# Patient Record
Sex: Male | Born: 1960 | Race: White | Hispanic: No | Smoking: Current every day smoker
Health system: Southern US, Community
[De-identification: ages and names within clinical notes are randomized; demographics above are authoritative.]

## PROBLEM LIST (undated history)

## (undated) DIAGNOSIS — J449 Chronic obstructive pulmonary disease, unspecified: Secondary | ICD-10-CM

## (undated) DIAGNOSIS — I82402 Acute embolism and thrombosis of unspecified deep veins of left lower extremity: Secondary | ICD-10-CM

## (undated) DIAGNOSIS — K5790 Diverticulosis of intestine, part unspecified, without perforation or abscess without bleeding: Secondary | ICD-10-CM

## (undated) DIAGNOSIS — G5600 Carpal tunnel syndrome, unspecified upper limb: Secondary | ICD-10-CM

## (undated) DIAGNOSIS — Z95828 Presence of other vascular implants and grafts: Secondary | ICD-10-CM

## (undated) DIAGNOSIS — Z9889 Other specified postprocedural states: Secondary | ICD-10-CM

## (undated) DIAGNOSIS — G9389 Other specified disorders of brain: Secondary | ICD-10-CM

## (undated) DIAGNOSIS — I10 Essential (primary) hypertension: Secondary | ICD-10-CM

## (undated) DIAGNOSIS — I471 Supraventricular tachycardia, unspecified: Secondary | ICD-10-CM

## (undated) DIAGNOSIS — I209 Angina pectoris, unspecified: Secondary | ICD-10-CM

## (undated) DIAGNOSIS — F1020 Alcohol dependence, uncomplicated: Secondary | ICD-10-CM

## (undated) DIAGNOSIS — I447 Left bundle-branch block, unspecified: Secondary | ICD-10-CM

## (undated) DIAGNOSIS — F32A Depression, unspecified: Secondary | ICD-10-CM

## (undated) DIAGNOSIS — Z7901 Long term (current) use of anticoagulants: Secondary | ICD-10-CM

## (undated) DIAGNOSIS — R7303 Prediabetes: Secondary | ICD-10-CM

## (undated) DIAGNOSIS — K219 Gastro-esophageal reflux disease without esophagitis: Secondary | ICD-10-CM

## (undated) DIAGNOSIS — R0600 Dyspnea, unspecified: Secondary | ICD-10-CM

## (undated) DIAGNOSIS — M1990 Unspecified osteoarthritis, unspecified site: Secondary | ICD-10-CM

## (undated) DIAGNOSIS — F17210 Nicotine dependence, cigarettes, uncomplicated: Secondary | ICD-10-CM

## (undated) DIAGNOSIS — G992 Myelopathy in diseases classified elsewhere: Secondary | ICD-10-CM

## (undated) DIAGNOSIS — K760 Fatty (change of) liver, not elsewhere classified: Secondary | ICD-10-CM

## (undated) DIAGNOSIS — I5189 Other ill-defined heart diseases: Secondary | ICD-10-CM

## (undated) DIAGNOSIS — I499 Cardiac arrhythmia, unspecified: Secondary | ICD-10-CM

## (undated) DIAGNOSIS — I6529 Occlusion and stenosis of unspecified carotid artery: Secondary | ICD-10-CM

## (undated) DIAGNOSIS — I77810 Thoracic aortic ectasia: Secondary | ICD-10-CM

## (undated) DIAGNOSIS — M720 Palmar fascial fibromatosis [Dupuytren]: Secondary | ICD-10-CM

## (undated) DIAGNOSIS — E785 Hyperlipidemia, unspecified: Secondary | ICD-10-CM

## (undated) DIAGNOSIS — E559 Vitamin D deficiency, unspecified: Secondary | ICD-10-CM

## (undated) DIAGNOSIS — K4020 Bilateral inguinal hernia, without obstruction or gangrene, not specified as recurrent: Secondary | ICD-10-CM

## (undated) DIAGNOSIS — I714 Abdominal aortic aneurysm, without rupture, unspecified: Secondary | ICD-10-CM

## (undated) DIAGNOSIS — I2510 Atherosclerotic heart disease of native coronary artery without angina pectoris: Secondary | ICD-10-CM

## (undated) DIAGNOSIS — E538 Deficiency of other specified B group vitamins: Secondary | ICD-10-CM

## (undated) DIAGNOSIS — R918 Other nonspecific abnormal finding of lung field: Secondary | ICD-10-CM

## (undated) DIAGNOSIS — I82409 Acute embolism and thrombosis of unspecified deep veins of unspecified lower extremity: Secondary | ICD-10-CM

## (undated) DIAGNOSIS — F419 Anxiety disorder, unspecified: Secondary | ICD-10-CM

## (undated) DIAGNOSIS — I700 Atherosclerosis of aorta: Secondary | ICD-10-CM

## (undated) DIAGNOSIS — I779 Disorder of arteries and arterioles, unspecified: Secondary | ICD-10-CM

## (undated) HISTORY — PX: CHOLECYSTECTOMY: SHX55

## (undated) HISTORY — PX: ANKLE SURGERY: SHX546

## (undated) HISTORY — PX: COLONOSCOPY: SHX174

## (undated) HISTORY — PX: SVT ABLATION: EP1225

## (undated) HISTORY — PX: AORTO-FEMORAL BYPASS GRAFT: SHX885

## (undated) HISTORY — PX: TONSILLECTOMY: SUR1361

---

## 1998-04-22 HISTORY — PX: CHOLECYSTECTOMY: SHX55

## 2009-05-26 DIAGNOSIS — F1010 Alcohol abuse, uncomplicated: Secondary | ICD-10-CM | POA: Insufficient documentation

## 2009-05-26 DIAGNOSIS — F17200 Nicotine dependence, unspecified, uncomplicated: Secondary | ICD-10-CM | POA: Insufficient documentation

## 2011-10-22 ENCOUNTER — Ambulatory Visit: Payer: Self-pay | Admitting: Family Medicine

## 2013-12-03 DIAGNOSIS — J449 Chronic obstructive pulmonary disease, unspecified: Secondary | ICD-10-CM | POA: Insufficient documentation

## 2013-12-03 DIAGNOSIS — I10 Essential (primary) hypertension: Secondary | ICD-10-CM | POA: Insufficient documentation

## 2013-12-03 DIAGNOSIS — F419 Anxiety disorder, unspecified: Secondary | ICD-10-CM | POA: Insufficient documentation

## 2013-12-03 DIAGNOSIS — I2510 Atherosclerotic heart disease of native coronary artery without angina pectoris: Secondary | ICD-10-CM | POA: Insufficient documentation

## 2013-12-03 DIAGNOSIS — J309 Allergic rhinitis, unspecified: Secondary | ICD-10-CM | POA: Insufficient documentation

## 2014-12-07 ENCOUNTER — Ambulatory Visit: Admission: EM | Admit: 2014-12-07 | Discharge: 2014-12-07 | Discharge status: Absent without leave | Payer: Self-pay

## 2014-12-07 ENCOUNTER — Ambulatory Visit
Admission: EM | Admit: 2014-12-07 | Discharge: 2014-12-07 | Disposition: A | Discharge status: Home / Self Care | Payer: 59

## 2014-12-07 DIAGNOSIS — R519 Headache, unspecified: Secondary | ICD-10-CM

## 2014-12-07 DIAGNOSIS — R51 Headache: Secondary | ICD-10-CM

## 2014-12-07 DIAGNOSIS — I10 Essential (primary) hypertension: Secondary | ICD-10-CM | POA: Diagnosis not present

## 2014-12-07 HISTORY — DX: Essential (primary) hypertension: I10

## 2014-12-07 NOTE — ED Provider Notes (Signed)
Patient presents today with symptoms of right temporal headache, right eye pain, mild dizziness, headache that seems to be getting worse since yesterday. Patient has a history of high blood pressure and does take blood pressure medication daily. He also does take baby aspirin daily. He has not seen a physician in over a year and a half. He does drink alcohol 3-4 beers a night and also does smoke cigarettes. Denies any chest pain, shortness of breath, abdominal pain, nausea, vomiting, fever, vision loss, slurred speech, weakness of extremities.  ROS: Negative except mentioned above. Vitals as per Epic.  GENERAL: NAD HEENT: no significant tenderness of the right temporal area to palpation, no pharyngeal erythema, no exudate, no erythema of TMs, PERRL, EOMI, unable to appreciate a foreign body or lesion on the eyelids RESP: CTA B CARD: RRR NEURO: CN II-XII grossly intact   A/P: Elevated Blood Pressure, Headache, and R Eye Symptoms- discussed with patient that I would recommend an EKG, labs, blood pressure lowering medication, and possibly transfer to the ER for further evaluation and treatment. Patient was not interested in this and insisted that he just wanted 1 clonidine to decrease his blood pressure. Patient states that he will follow-up with his doctor tomorrow if possible. Patient was informed of the risks with untreated elevated blood pressure including stroke, death. Patient signed AMA since he did not want to follow my recommendations above.    Jolene Provost, MD 12/07/14 1414

## 2014-12-07 NOTE — ED Notes (Addendum)
States yesterday at work and suddenly became dizzy and right eye pain. Felt pain and headache since and "getting worse". Took B/P this morning and was 183/111. Just prior to leaving work was 155/101

## 2014-12-07 NOTE — ED Notes (Signed)
Examined by Dr. Concha Se and recommended patient go to ER now. Patient refused to go and AMA form signed by patient in his assigned room. Admits to drinking 3-4 beers per day, breath smells of alcohol on assessment. Advised of potential problems with lack of diagnosis and/or treatment.

## 2014-12-08 ENCOUNTER — Emergency Department: Payer: 59

## 2014-12-08 ENCOUNTER — Emergency Department
Admission: EM | Admit: 2014-12-08 | Discharge: 2014-12-08 | Disposition: A | Discharge status: Home / Self Care | Payer: 59 | Attending: Student | Admitting: Student

## 2014-12-08 ENCOUNTER — Other Ambulatory Visit: Payer: Self-pay

## 2014-12-08 ENCOUNTER — Encounter: Payer: Self-pay | Admitting: Emergency Medicine

## 2014-12-08 DIAGNOSIS — Z79899 Other long term (current) drug therapy: Secondary | ICD-10-CM | POA: Diagnosis not present

## 2014-12-08 DIAGNOSIS — R51 Headache: Secondary | ICD-10-CM | POA: Insufficient documentation

## 2014-12-08 DIAGNOSIS — I10 Essential (primary) hypertension: Secondary | ICD-10-CM | POA: Insufficient documentation

## 2014-12-08 DIAGNOSIS — J0130 Acute sphenoidal sinusitis, unspecified: Secondary | ICD-10-CM

## 2014-12-08 DIAGNOSIS — H5711 Ocular pain, right eye: Secondary | ICD-10-CM | POA: Diagnosis not present

## 2014-12-08 DIAGNOSIS — Z720 Tobacco use: Secondary | ICD-10-CM | POA: Diagnosis not present

## 2014-12-08 DIAGNOSIS — Z7982 Long term (current) use of aspirin: Secondary | ICD-10-CM | POA: Diagnosis not present

## 2014-12-08 LAB — BASIC METABOLIC PANEL
ANION GAP: 8 (ref 5–15)
BUN: 13 mg/dL (ref 6–20)
CHLORIDE: 99 mmol/L — AB (ref 101–111)
CO2: 31 mmol/L (ref 22–32)
Calcium: 9.4 mg/dL (ref 8.9–10.3)
Creatinine, Ser: 0.9 mg/dL (ref 0.61–1.24)
GFR calc Af Amer: >60 mL/min (ref >60)
GLUCOSE: 122 mg/dL — AB (ref 65–99)
POTASSIUM: 3.8 mmol/L (ref 3.5–5.1)
Sodium: 138 mmol/L (ref 135–145)

## 2014-12-08 LAB — CBC
HEMATOCRIT: 48.7 % (ref 40.0–52.0)
HEMOGLOBIN: 16.3 g/dL (ref 13.0–18.0)
MCH: 31.9 pg (ref 26.0–34.0)
MCHC: 33.5 g/dL (ref 32.0–36.0)
MCV: 95.1 fL (ref 80.0–100.0)
Platelets: 198 10*3/uL (ref 150–440)
RBC: 5.13 MIL/uL (ref 4.40–5.90)
RDW: 13.3 % (ref 11.5–14.5)
WBC: 6.8 10*3/uL (ref 3.8–10.6)

## 2014-12-08 MED ORDER — SODIUM CHLORIDE 0.9 % IV BOLUS (SEPSIS)
500.0000 mL | Freq: Once | INTRAVENOUS | Status: AC
  Administered 2014-12-08: 500 mL via INTRAVENOUS

## 2014-12-08 MED ORDER — IOHEXOL 300 MG/ML  SOLN
75.0000 mL | Freq: Once | INTRAMUSCULAR | Status: AC | PRN
  Administered 2014-12-08: 75 mL via INTRAVENOUS

## 2014-12-08 MED ORDER — CLONIDINE HCL 0.1 MG PO TABS
0.1000 mg | ORAL_TABLET | Freq: Once | ORAL | Status: AC
  Administered 2014-12-08: 0.1 mg via ORAL
  Filled 2014-12-08: qty 1

## 2014-12-08 MED ORDER — AMOXICILLIN-POT CLAVULANATE 875-125 MG PO TABS: 1.0000 | ORAL_TABLET | Freq: Two times a day (BID) | ORAL | Status: DC

## 2014-12-08 NOTE — ED Provider Notes (Signed)
[facility] Emergency Department Provider Note  ____________________________________________  Time seen: Approximately 9:46 AM  I have reviewed the triage vital signs and the nursing notes.   HISTORY  Chief Complaint Hypertension and Eye Pain    HPI Scott [name] is a 54 y.o. male with history of hypertension, anxiety who presents for evaluation of 2 days constant right-sided headache, right eye pain. Patient reports that 2 days ago he felt like something "burst in my eye" suddenly. He has had no change in his vision. He has had pain associated with the right eye, periorbital region. He has had some tearing from the eye but no drainage.  He has chronic runny nose. He was seen by his doctor yesterday and his blood pressure was elevated they referred him to the emergency department however he was not able to come. He denies any trauma to the eye. Currently his symptoms are moderate in severity. Pain in the eyes worsen with movement of the eye. No chest pain or difficulty breathing, no nausea, vomiting, diarrhea, fevers or chills. He does take  Lisinopril for  Blood pressure. Headache is gradual onset, has been intermittent. Current severity of pain is 5 out of 10.   Past Medical History  Diagnosis Date  . Hypertension     There are no active problems to display for this patient.   Past Surgical History  Procedure Laterality Date  . Cholecystectomy      Current Outpatient Rx  Name  Route  Sig  Dispense  Refill  . aspirin 81 MG tablet   Oral   Take 81 mg by mouth daily.         Marland Kitchen lisinopril-hydrochlorothiazide (PRINZIDE,ZESTORETIC) 20-25 MG per tablet   Oral   Take 1 tablet by mouth daily.         Marland Kitchen amoxicillin-clavulanate (AUGMENTIN) 875-125 MG per tablet   Oral   Take 1 tablet by mouth 2 (two) times daily.   20 tablet   0     Allergies Codeine  Family History  Problem Relation Age of Onset  . Diabetes Mother   . Hypertension Mother    . Lupus Mother   . Heart attack Father   . Cancer Brother   . Cancer Brother     Social History Social History  Substance Use Topics  . Smoking status: Current Every Day Smoker -- 1.00 packs/day    Types: Cigarettes  . Smokeless tobacco: None  . Alcohol Use: Yes     Comment: 3-4 beers per day    Review of Systems Constitutional: No fever/chills Eyes: No visual changes. ENT: No sore throat. Cardiovascular: Denies chest pain. Respiratory: Denies shortness of breath. Gastrointestinal: No abdominal pain.  No nausea, no vomiting.  No diarrhea.  No constipation. Genitourinary: Negative for dysuria. Musculoskeletal: Negative for back pain. Skin: Negative for rash. Neurological: Positive for headache, negative for focal weakness or numbness.  10-point ROS otherwise negative.  ____________________________________________   PHYSICAL EXAM:  VITAL SIGNS: ED Triage Vitals  Enc Vitals Group     BP 12/08/14 0907 182/112 mmHg     Pulse Rate 12/08/14 0907 85     Resp 12/08/14 0907 18     Temp 12/08/14 0907 98.2 F (36.8 C)     Temp Source 12/08/14 0907 Oral     SpO2 12/08/14 0907 97 %     Weight 12/08/14 0907 169 lb (76.658 kg)     Height 12/08/14 0907 5\' 11"  (1.803 m)  Head Cir --      Peak Flow --      Pain Score 12/08/14 0911 6     Pain Loc --      Pain Edu? --      Excl. in GC? --     Constitutional: Alert and oriented. Well appearing and in no acute distress. Eyes: Conjunctivae are normal. PERRL. EOMI. Head: Atraumatic. Tenderness to palpation in the periorbital region on the right. Nose: No congestion/rhinnorhea. Mouth/Throat: Mucous membranes are moist.  Oropharynx non-erythematous. Neck: No stridor.  Cardiovascular: Normal rate, regular rhythm. Grossly normal heart sounds.  Good peripheral circulation. Respiratory: Normal respiratory effort.  No retractions. Lungs CTAB. Gastrointestinal: Soft and nontender. No distention. No abdominal bruits. No CVA  tenderness. Genitourinary: deferred Musculoskeletal: No lower extremity tenderness nor edema.  No joint effusions. Neurologic:  Normal speech and language. No gross focal neurologic deficits are appreciated. No gait instability. Skin:  Skin is warm, dry and intact. No rash noted. Psychiatric: Mood and affect are normal. Speech and behavior are normal.  ____________________________________________   LABS (all labs ordered are listed, but only abnormal results are displayed)  Labs Reviewed  BASIC METABOLIC PANEL - Abnormal; Notable for the following:    Chloride 99 (*)    Glucose, Bld 122 (*)    All other components within normal limits  CBC   ____________________________________________  EKG  ED ECG REPORT I, Gayla Doss, the attending physician, personally viewed and interpreted this ECG.   Date: 12/08/2014  EKG Time: 09:14  Rate: 71  Rhythm: normal EKG, normal sinus rhythm  Axis: normal  Intervals:none  ST&T Change: No acute ST segment elevation  ____________________________________________  RADIOLOGY  CT head IMPRESSION: 1. Negative noncontrast CT appearance of the brain. 2. Chronic right sphenoid sinusitis.   CT head with IV contrast IMPRESSION: Negative CT venogram. No cavernous sinus or other venous sinus thrombosis.  Advanced bilateral ICA siphon calcified atherosclerosis. ____________________________________________   PROCEDURES  Procedure(s) performed: None  Critical Care performed: No  ____________________________________________   INITIAL IMPRESSION / ASSESSMENT AND PLAN / ED COURSE  Pertinent labs & imaging results that were available during my care of the patient were reviewed by me and considered in my medical decision making (see chart for details).  [name] is a 54 y.o. male with history of hypertension, anxiety who presents for evaluation of 2 days constant right-sided headache, right eye pain. On exam, he is very  well-appearing in no acute distress. He is afebrile. He has pain with movement of the right eye but extraocular movements are intact. Pupils equally reactive to light. He has tenderness in the infraorbital region, supraorbital region, lateral to the right eye. There is no proptosis. He has no fever, and neck is supple without meningismus. CT head is concerning for right sphenoid sinusitis which may be the cause of his symptoms however given his headache and pain, discussed with radiologist, Dr. Margo Aye, who recommends CT venogram for rule out cavernous venous sinus thrombosis. His blood pressure has improved without any intervention. I do not think that his headache is secondary to his hypertension. He has no chest pain, no difficulty breathing, no evidence of end organ damage.    ----------------------------------------- 3:27 PM on 12/08/2014 ----------------------------------------- CT venogram negative for any acute Sinus thrombosis. Normal visual acuity. Blood pressure improved with clonidine. Suspect sinusitis as the cause of his right-sided facial/eye pain. Discussed return precautions, need for close PCP follow-up and recheck of blood pressure. DC with Augmentin. He  is comfortable with discharge plan.  ____________________________________________   FINAL CLINICAL IMPRESSION(S) / ED DIAGNOSES  Final diagnoses:  Acute pain in right eye  Acute sphenoidal sinusitis, recurrence not specified      Gayla Doss, MD 12/08/14 1609

## 2014-12-08 NOTE — ED Notes (Signed)
Patient to ED after being seen at [facility] yesterday, reports he had something "pop" behind his right eye a few days ago and then was seen yesterday where they found his BP was elevated. They wanted him to come at that time but he was unable to. Reports compliance with his BP meds.

## 2014-12-08 NOTE — ED Notes (Signed)
Increase stress at work , increased HTN , x2 days felt something like a burst occurred in the right eye, with irritation. No visual changes,

## 2014-12-08 NOTE — ED Notes (Signed)
MD at bedside., Dr.Gayle 

## 2015-06-19 DIAGNOSIS — E7800 Pure hypercholesterolemia, unspecified: Secondary | ICD-10-CM | POA: Insufficient documentation

## 2015-06-19 DIAGNOSIS — F17210 Nicotine dependence, cigarettes, uncomplicated: Secondary | ICD-10-CM | POA: Insufficient documentation

## 2015-12-20 DIAGNOSIS — I471 Supraventricular tachycardia, unspecified: Secondary | ICD-10-CM | POA: Insufficient documentation

## 2016-02-28 HISTORY — PX: SVT ABLATION: EP1225

## 2016-10-09 ENCOUNTER — Ambulatory Visit
Admission: EM | Admit: 2016-10-09 | Discharge: 2016-10-09 | Disposition: A | Discharge status: Home / Self Care | Payer: Worker's Compensation | Attending: Emergency Medicine | Admitting: Emergency Medicine

## 2016-10-09 ENCOUNTER — Encounter: Payer: Self-pay | Admitting: Gynecology

## 2016-10-09 ENCOUNTER — Ambulatory Visit (INDEPENDENT_AMBULATORY_CARE_PROVIDER_SITE_OTHER): Payer: Worker's Compensation

## 2016-10-09 DIAGNOSIS — M792 Neuralgia and neuritis, unspecified: Secondary | ICD-10-CM

## 2016-10-09 DIAGNOSIS — S5002XA Contusion of left elbow, initial encounter: Secondary | ICD-10-CM

## 2016-10-09 MED ORDER — PREDNISONE 10 MG (21) PO TBPK: ORAL_TABLET | ORAL | 0 refills | Status: DC

## 2016-10-09 MED ORDER — IBUPROFEN 600 MG PO TABS: 600.0000 mg | ORAL_TABLET | Freq: Four times a day (QID) | ORAL | 0 refills | Status: AC | PRN

## 2016-10-09 MED ORDER — TRAMADOL HCL 50 MG PO TABS: 50.0000 mg | ORAL_TABLET | Freq: Four times a day (QID) | ORAL | 0 refills | Status: DC | PRN

## 2016-10-09 NOTE — ED Provider Notes (Signed)
HPI  SUBJECTIVE:  Scott [name] is a right handed 56 y.o. male who presents with left elbow pain, numbness along the top of his hand, burning pain along his lateral left arm after bumping his lateral elbow against a trolley yesterday. He reports swelling at the lateral joint, constant stinging pain going up his lateral arm and extending down his forearm. Reports numbness along the top of the hand and in the fingers. No numbness along the palm of the hand.  He reports limitation of motion secondary to pain and purplish discoloration of his hand. States that's what prompted medical evaluation. He tried ice, Tylenol. Sx better with holding his elbow in a flexed position Symptoms worse with lifting, driving, gripping objects, pronation, supination, flexion and extension of his elbow. He states that he hit his left elbow on a ladder at work 6 days ago, but that there was no residual injury. No previous history of injury to elbow he is not on any antiplatelet or anticoagulant drugs. No history of diabetes, kidney disease. 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 PMD: Dr. Elmer Ramp. This is a worker's comp injury.  Past Medical History:  Diagnosis Date  . Hypertension     Past Surgical History:  Procedure Laterality Date  . CHOLECYSTECTOMY      Family History  Problem Relation Age of Onset  . Diabetes Mother   . Hypertension Mother   . Lupus Mother   . Heart attack Father   . Cancer Brother   . Cancer Brother     Social History  Substance Use Topics  . Smoking status: Current Every Day Smoker    Packs/day: 1.00    Types: Cigarettes  . Smokeless tobacco: Never Used  . Alcohol use Yes     Comment: 3-4 beers per day    No current facility-administered medications for this encounter.   Current Outpatient Prescriptions:  .  aspirin 81 MG tablet, Take 81 mg by mouth daily., Disp: , Rfl:  .   lisinopril-hydrochlorothiazide (PRINZIDE,ZESTORETIC) 20-25 MG per tablet, Take 1 tablet by mouth daily., Disp: , Rfl:  .  ibuprofen (ADVIL,MOTRIN) 600 MG tablet, Take 1 tablet (600 mg total) by mouth every 6 (six) hours as needed., Disp: 20 tablet, Rfl: 0 .  predniSONE (STERAPRED UNI-PAK 21 TAB) 10 MG (21) TBPK tablet, Dispense one 6 day pack. Take as directed with food., Disp: 21 tablet, Rfl: 0 .  traMADol (ULTRAM) 50 MG tablet, Take 1 tablet (50 mg total) by mouth every 6 (six) hours as needed., Disp: 20 tablet, Rfl: 0  Allergies  Allergen Reactions  . Codeine Nausea Only     ROS  As noted in HPI.   Physical Exam  BP (!) 145/96 (BP Location: Right Arm)   Pulse 72   Temp 98.6 F (37 C) (Oral)   Resp 16   Ht 5\' 11"  (1.803 m)   Wt 165 lb (74.8 kg)   SpO2 99%   BMI 23.01 kg/m   Constitutional: Well developed, well nourished, no acute distress Eyes:  EOMI, conjunctiva normal bilaterally HENT: Normocephalic, atraumatic,mucus membranes moist Respiratory: Normal inspiratory effort Cardiovascular: Normal rate GI: nondistended skin: No rash, skin intact Musculoskeletal: L Elbow ROM  Decreased, Unable to fully extend /pain with flex to 90 degrees,   Supracondylar region NT, Radial head tender, Olecrenon process NT, Medial epicondyle NT , Lateral epicondyle tender, left Shoulder NT, Wrist NT, Hand NT with  distal NVI CR<2 secs, radial pulse intact, Sensation LT and Motor intact distally in distribution of radial, median, and ulnar nerve function. Pain with supination, pronation, flexion and extension. And is pink and warm. Dependent rubor in both hands that resolves with hand elevation. No wrist or finger drop  Neurologic: Alert & oriented x 3, no focal neuro deficits Psychiatric: Speech and behavior appropriate   ED Course   Medications - No data to display  Orders Placed This Encounter  Procedures  . DG Elbow Complete Left    Standing Status:   Standing    Number of  Occurrences:   1    Order Specific Question:   Reason for Exam (SYMPTOM  OR DIAGNOSIS REQUIRED)    Answer:   lateral elbow injury r/o fx, effusion  . Apply other splint    Standing Status:   Standing    Number of Occurrences:   1    Order Specific Question:   Laterality    Answer:   Left    Order Specific Question:   Splint type    Answer:   posterior elbow splint with sling please    No results found for this or any previous visit (from the past 24 hour(s)). Dg Elbow Complete Left  Result Date: 10/09/2016 CLINICAL DATA:  Left elbow pain since 2 episodes of blunt trauma in the last week. EXAM: LEFT ELBOW - COMPLETE 3+ VIEW COMPARISON:  None FINDINGS: There is calcification at the medial and lateral epicondyles consistent with chronic medial and lateral epicondylitis. There is no fracture or dislocation. No joint effusion. No significant soft tissue swelling. IMPRESSION: Chronic degenerative changes of the medial and lateral epicondyles of the distal humerus. No acute abnormality. Electronically Signed   By: Francene BoyersJames  Maxwell M.D.   On: 10/09/2016 15:29    ED Clinical Impression  Contusion of left elbow, initial encounter  Nerve pain   ED Assessment/Plan  Reviewed imaging independently. Calcifications at the medial and lateral epicondyles consistent with chronic medial and lateral epicondylitis. No fracture-dislocation, effusion, soft tissue swelling. See radiology report for full details.  Presentation most consistent with elbow injury with  radial nerve contusion. There does not appear to be any acute arterial or venous occlusion.doubt compartment syndrome. Suspect that he has chronic peripheral vascular disease as he states he has it in his lower extremities and he is a smoker.he'll need to follow-up with his PMD regarding this.  Plan to put in a posterior elbow splint as he states that symptoms are better with holding his arm flexed, we'll also place in sling. Home with tramadol,  prednisone taper for 6 days, ibuprofen 600 mg 1 g of Tylenol follow-up with Tommie Maximiano CossAnne Moore in 2 days for reevaluation and possible referral to orthopedics.  Discussed imaging, MDM, plan and followup with patient. Discussed sn/sx that should prompt return to the ED. Patient agrees with plan.   Meds ordered this encounter  Medications  . predniSONE (STERAPRED UNI-PAK 21 TAB) 10 MG (21) TBPK tablet    Sig: Dispense one 6 day pack. Take as directed with food.    Dispense:  21 tablet    Refill:  0  . ibuprofen (ADVIL,MOTRIN) 600 MG tablet    Sig: Take 1 tablet (600 mg total) by mouth every 6 (six) hours as needed.    Dispense:  20 tablet    Refill:  0  . traMADol (ULTRAM) 50 MG tablet    Sig: Take 1 tablet (50 mg total) by mouth every  6 (six) hours as needed.    Dispense:  20 tablet    Refill:  0    *This clinic note was created using Scientist, clinical (histocompatibility and immunogenetics). Therefore, there may be occasional mistakes despite careful proofreading.  ?   Domenick Gong, MD 10/09/16 1622

## 2016-10-09 NOTE — Discharge Instructions (Signed)
600 mg of ibuprofen with 1 g and Tylenol 3-4 times a day as needed for pain. This is a very effective combination for pain. Initial prednisone. Tramadol for severe pain only. Wear the splint and the sling as needed for comfort. Follow up with Scott [name] within 2 days.

## 2016-10-09 NOTE — ED Triage Notes (Signed)
Patient c/o injury to left elbow x yesterday at work. Per patient one of the associate at work cart hit his left elbow. Per patient had previous injury to left elbow x 1 week ago by a ladder at work.

## 2017-03-28 DIAGNOSIS — Z8261 Family history of arthritis: Secondary | ICD-10-CM | POA: Insufficient documentation

## 2017-03-28 DIAGNOSIS — M129 Arthropathy, unspecified: Secondary | ICD-10-CM | POA: Insufficient documentation

## 2017-03-28 DIAGNOSIS — Z789 Other specified health status: Secondary | ICD-10-CM | POA: Insufficient documentation

## 2017-03-28 DIAGNOSIS — R079 Chest pain, unspecified: Secondary | ICD-10-CM

## 2017-03-28 DIAGNOSIS — Z9889 Other specified postprocedural states: Secondary | ICD-10-CM | POA: Insufficient documentation

## 2017-03-28 DIAGNOSIS — Z8679 Personal history of other diseases of the circulatory system: Secondary | ICD-10-CM | POA: Insufficient documentation

## 2017-03-28 DIAGNOSIS — M720 Palmar fascial fibromatosis [Dupuytren]: Secondary | ICD-10-CM | POA: Insufficient documentation

## 2017-03-28 HISTORY — DX: Chest pain, unspecified: R07.9

## 2017-06-03 ENCOUNTER — Ambulatory Visit
Admission: RE | Admit: 2017-06-03 | Discharge: 2017-06-03 | Disposition: A | Discharge status: Home / Self Care | Payer: 59 | Source: Ambulatory Visit | Attending: Internal Medicine | Admitting: Internal Medicine

## 2017-06-03 ENCOUNTER — Encounter
Admission: RE | Disposition: A | Discharge status: Home / Self Care | Payer: Self-pay | Source: Ambulatory Visit | Attending: Internal Medicine

## 2017-06-03 DIAGNOSIS — R002 Palpitations: Secondary | ICD-10-CM | POA: Insufficient documentation

## 2017-06-03 DIAGNOSIS — I2510 Atherosclerotic heart disease of native coronary artery without angina pectoris: Secondary | ICD-10-CM

## 2017-06-03 DIAGNOSIS — E785 Hyperlipidemia, unspecified: Secondary | ICD-10-CM | POA: Diagnosis not present

## 2017-06-03 DIAGNOSIS — F419 Anxiety disorder, unspecified: Secondary | ICD-10-CM | POA: Diagnosis not present

## 2017-06-03 DIAGNOSIS — I25119 Atherosclerotic heart disease of native coronary artery with unspecified angina pectoris: Secondary | ICD-10-CM | POA: Diagnosis not present

## 2017-06-03 DIAGNOSIS — F17200 Nicotine dependence, unspecified, uncomplicated: Secondary | ICD-10-CM | POA: Insufficient documentation

## 2017-06-03 DIAGNOSIS — R079 Chest pain, unspecified: Secondary | ICD-10-CM | POA: Diagnosis present

## 2017-06-03 DIAGNOSIS — I209 Angina pectoris, unspecified: Secondary | ICD-10-CM | POA: Diagnosis not present

## 2017-06-03 HISTORY — DX: Atherosclerotic heart disease of native coronary artery without angina pectoris: I25.10

## 2017-06-03 HISTORY — PX: LEFT HEART CATH AND CORONARY ANGIOGRAPHY: CATH118249

## 2017-06-03 LAB — APTT: aPTT: 28 seconds (ref 24–36)

## 2017-06-03 LAB — BASIC METABOLIC PANEL
ANION GAP: 11 (ref 5–15)
BUN: 14 mg/dL (ref 6–20)
CHLORIDE: 100 mmol/L — AB (ref 101–111)
CO2: 26 mmol/L (ref 22–32)
Calcium: 9 mg/dL (ref 8.9–10.3)
Creatinine, Ser: 0.7 mg/dL (ref 0.61–1.24)
GFR calc Af Amer: >60 mL/min (ref >60)
GLUCOSE: 108 mg/dL — AB (ref 65–99)
POTASSIUM: 3.6 mmol/L (ref 3.5–5.1)
Sodium: 137 mmol/L (ref 135–145)

## 2017-06-03 LAB — LIPID PANEL
CHOL/HDL RATIO: 2.2 ratio
Cholesterol: 139 mg/dL (ref 0–200)
HDL: 64 mg/dL (ref >40)
LDL CALC: 66 mg/dL (ref 0–99)
Triglycerides: 43 mg/dL (ref <150)
VLDL: 9 mg/dL (ref 0–40)

## 2017-06-03 LAB — POCT I-STAT, CHEM 8
BUN: 12 mg/dL (ref 6–20)
CREATININE: 0.7 mg/dL (ref 0.61–1.24)
Calcium, Ion: 1.18 mmol/L (ref 1.15–1.40)
Chloride: 98 mmol/L — ABNORMAL LOW (ref 101–111)
Glucose, Bld: 108 mg/dL — ABNORMAL HIGH (ref 65–99)
HEMATOCRIT: 44 % (ref 39.0–52.0)
Hemoglobin: 15 g/dL (ref 13.0–17.0)
POTASSIUM: 3.6 mmol/L (ref 3.5–5.1)
Sodium: 138 mmol/L (ref 135–145)
TCO2: 27 mmol/L (ref 22–32)

## 2017-06-03 LAB — CBC WITH DIFFERENTIAL/PLATELET
BASOS ABS: 0 10*3/uL (ref 0–0.1)
Basophils Relative: 1 %
Eosinophils Absolute: 0 10*3/uL (ref 0–0.7)
Eosinophils Relative: 0 %
HEMATOCRIT: 43.9 % (ref 40.0–52.0)
Hemoglobin: 14.7 g/dL (ref 13.0–18.0)
LYMPHS ABS: 0.9 10*3/uL — AB (ref 1.0–3.6)
LYMPHS PCT: 13 %
MCH: 31.9 pg (ref 26.0–34.0)
MCHC: 33.4 g/dL (ref 32.0–36.0)
MCV: 95.6 fL (ref 80.0–100.0)
MONO ABS: 0.7 10*3/uL (ref 0.2–1.0)
Monocytes Relative: 10 %
NEUTROS ABS: 5.2 10*3/uL (ref 1.4–6.5)
Neutrophils Relative %: 76 %
Platelets: 190 10*3/uL (ref 150–440)
RBC: 4.6 MIL/uL (ref 4.40–5.90)
RDW: 13.5 % (ref 11.5–14.5)
WBC: 6.8 10*3/uL (ref 3.8–10.6)

## 2017-06-03 LAB — PROTIME-INR
INR: 0.96
Prothrombin Time: 12.7 seconds (ref 11.4–15.2)

## 2017-06-03 LAB — HEMOGLOBIN A1C
HEMOGLOBIN A1C: 5.6 % (ref 4.8–5.6)
Mean Plasma Glucose: 114.02 mg/dL

## 2017-06-03 LAB — CARDIAC CATHETERIZATION: Cath EF Quantitative: 55 %

## 2017-06-03 LAB — TROPONIN I: Troponin I: <0.03 ng/mL (ref <0.03)

## 2017-06-03 SURGERY — LEFT HEART CATH AND CORONARY ANGIOGRAPHY
Anesthesia: Moderate Sedation | Laterality: Left

## 2017-06-03 SURGERY — LEFT HEART CATH AND CORONARY ANGIOGRAPHY
Anesthesia: Moderate Sedation

## 2017-06-03 MED ORDER — ONDANSETRON HCL 4 MG/2ML IJ SOLN: 4.0000 mg | Freq: Four times a day (QID) | INTRAMUSCULAR | Status: DC | PRN

## 2017-06-03 MED ORDER — SODIUM CHLORIDE 0.9% FLUSH: 3.0000 mL | INTRAVENOUS | Status: DC | PRN

## 2017-06-03 MED ORDER — ASPIRIN 81 MG PO CHEW: 81.0000 mg | CHEWABLE_TABLET | ORAL | Status: DC

## 2017-06-03 MED ORDER — MIDAZOLAM HCL 2 MG/2ML IJ SOLN
INTRAMUSCULAR | Status: AC
  Filled 2017-06-03: qty 2

## 2017-06-03 MED ORDER — SODIUM CHLORIDE 0.9 % IV SOLN: 250.0000 mL | INTRAVENOUS | Status: DC | PRN

## 2017-06-03 MED ORDER — SODIUM CHLORIDE 0.9 % WEIGHT BASED INFUSION
3.0000 mL/kg/h | INTRAVENOUS | Status: AC
  Administered 2017-06-03: 3 mL/kg/h via INTRAVENOUS

## 2017-06-03 MED ORDER — SODIUM CHLORIDE 0.9% FLUSH: 3.0000 mL | Freq: Two times a day (BID) | INTRAVENOUS | Status: DC

## 2017-06-03 MED ORDER — IOPAMIDOL (ISOVUE-300) INJECTION 61%
INTRAVENOUS | Status: DC | PRN
  Administered 2017-06-03: 110 mL via INTRA_ARTERIAL

## 2017-06-03 MED ORDER — SODIUM CHLORIDE 0.9 % WEIGHT BASED INFUSION: 1.0000 mL/kg/h | INTRAVENOUS | Status: DC

## 2017-06-03 MED ORDER — ACETAMINOPHEN 325 MG PO TABS: 650.0000 mg | ORAL_TABLET | ORAL | Status: DC | PRN

## 2017-06-03 MED ORDER — HEPARIN (PORCINE) IN NACL 2-0.9 UNIT/ML-% IJ SOLN
INTRAMUSCULAR | Status: AC
  Filled 2017-06-03: qty 500

## 2017-06-03 MED ORDER — FENTANYL CITRATE (PF) 100 MCG/2ML IJ SOLN
INTRAMUSCULAR | Status: AC
  Filled 2017-06-03: qty 2

## 2017-06-03 SURGICAL SUPPLY — 9 items
CATH INFINITI 5FR ANG PIGTAIL (CATHETERS) ×2 IMPLANT
CATH INFINITI 5FR JL4 (CATHETERS) ×2 IMPLANT
CATH INFINITI JR4 5F (CATHETERS) ×4 IMPLANT
DEVICE CLOSURE MYNXGRIP 5F (Vascular Products) ×2 IMPLANT
KIT MANI 3VAL PERCEP (MISCELLANEOUS) ×2 IMPLANT
NEEDLE PERC 18GX7CM (NEEDLE) ×2 IMPLANT
PACK CARDIAC CATH (CUSTOM PROCEDURE TRAY) ×2 IMPLANT
SHEATH AVANTI 5FR X 11CM (SHEATH) ×2 IMPLANT
WIRE GUIDERIGHT .035X150 (WIRE) ×2 IMPLANT

## 2017-06-03 NOTE — Progress Notes (Signed)
Dr. Juliann Paresallwood in at bedside to speak with pt. And sister. Pt. States he wishes to leave today; "I do not want to stay the night." "I just want the pictures taken and go from there."

## 2017-06-03 NOTE — Progress Notes (Signed)
Upon arrival, pt. Drinking Mt. Dew soda. Pt. States he ate 2 sausage biscuits at 08:00 ; has had 3 cups of coffee and 1 1/2 [place] [employer] since then. Call to Dr. Juliann Paresallwood. MD made aware of NPO status. MD wishes to procede with cath.

## 2017-06-03 NOTE — Progress Notes (Signed)
Pt. Remains Hypertensive. States "I think I forgot one of my BP pills. I will take it as soon as I get home." One time my BP was 298/200." Pt. Asymptomatic. DC instructions reviewed with pt. With verbalized understanding. Pt. DC'd home now with sister escort.

## 2017-06-03 NOTE — Progress Notes (Signed)
Subjective:  Presents today for outpatient cardiac cath procedure patient was seen and evaluated back in December patient elected to postpone his cath until February.  The patient has a history of anginal symptoms smoking with chest pain.  Patient is agreed not to go to work tomorrow with preferred not to have an intervention today.  Patient had a small breakfast earlier this morning  Objective:  Vital Signs in the last 24 hours: Temp:  [98.8 F (37.1 C)] 98.8 F (37.1 C) (02/12 1205) Pulse Rate:  [98] 98 (02/12 1205) Resp:  [18] 18 (02/12 1205) BP: (150)/(102) 150/102 (02/12 1205) SpO2:  [98 %] 98 % (02/12 1205) Weight:  [162 lb (73.5 kg)] 162 lb (73.5 kg) (02/12 1205)  Intake/Output from previous day: No intake/output data recorded. Intake/Output from this shift: No intake/output data recorded.  Physical Exam: General appearance: appears older than stated age Neck: no adenopathy, no carotid bruit, no JVD, supple, symmetrical, trachea midline and thyroid not enlarged, symmetric, no tenderness/mass/nodules Lungs: clear to auscultation bilaterally Heart: regular rate and rhythm, S1, S2 normal, no murmur, click, rub or gallop Abdomen: soft, non-tender; bowel sounds normal; no masses,  no organomegaly Extremities: extremities normal, atraumatic, no cyanosis or edema Pulses: 2+ and symmetric Skin: Skin color, texture, turgor normal. No rashes or lesions Neurologic: Alert and oriented X 3, normal strength and tone. Normal symmetric reflexes. Normal coordination and gait  Lab Results: No results for input(s): WBC, HGB, PLT in the last 72 hours. No results for input(s): NA, K, CL, CO2, GLUCOSE, BUN, CREATININE in the last 72 hours. No results for input(s): TROPONINI in the last 72 hours.  Invalid input(s): CK, MB Hepatic Function Panel No results for input(s): PROT, ALBUMIN, AST, ALT, ALKPHOS, BILITOT, BILIDIR, IBILI in the last 72 hours. No results for input(s): CHOL in the last 72  hours. No results for input(s): PROTIME in the last 72 hours.  Imaging: Imaging results have been reviewed  Cardiac Studies:  Assessment/Plan:  Angina Arrhythmia Chest Pain Palpitations Shortness of Breath  Smoking Hypertension Hyperlipidemia Anxiety History of ablation for SVT . Plan Recommend diagnostic cardiac cath as an outpatient today. Patient states he refuses to have intervention he is not inclined to stay overnight which is required after intervention Patient only wants a diagnostic procedure Patient is agreed not to return to work tomorrow to allow groin area to heal adequately. We will proceed with diagnostic cardiac cath today  LOS: 0 days    Salwa Bai D Ghada Abbett 06/03/2017, 1:10 PM

## 2017-06-04 ENCOUNTER — Encounter: Payer: Self-pay | Admitting: Internal Medicine

## 2017-06-10 DIAGNOSIS — I471 Supraventricular tachycardia: Secondary | ICD-10-CM | POA: Diagnosis not present

## 2017-06-10 DIAGNOSIS — E7800 Pure hypercholesterolemia, unspecified: Secondary | ICD-10-CM | POA: Diagnosis not present

## 2017-06-10 DIAGNOSIS — I10 Essential (primary) hypertension: Secondary | ICD-10-CM | POA: Diagnosis not present

## 2017-06-13 ENCOUNTER — Ambulatory Visit
Admission: RE | Admit: 2017-06-13 | Discharge: 2017-06-13 | Disposition: A | Discharge status: Home / Self Care | Payer: 59 | Source: Ambulatory Visit | Attending: Internal Medicine | Admitting: Internal Medicine

## 2017-06-13 ENCOUNTER — Other Ambulatory Visit: Payer: Self-pay | Admitting: Internal Medicine

## 2017-06-13 DIAGNOSIS — R9389 Abnormal findings on diagnostic imaging of other specified body structures: Secondary | ICD-10-CM | POA: Diagnosis not present

## 2017-06-13 DIAGNOSIS — I471 Supraventricular tachycardia: Secondary | ICD-10-CM | POA: Diagnosis not present

## 2017-06-13 DIAGNOSIS — I70201 Unspecified atherosclerosis of native arteries of extremities, right leg: Secondary | ICD-10-CM | POA: Diagnosis not present

## 2017-06-13 DIAGNOSIS — I724 Aneurysm of artery of lower extremity: Secondary | ICD-10-CM | POA: Diagnosis not present

## 2017-06-13 DIAGNOSIS — M79661 Pain in right lower leg: Secondary | ICD-10-CM | POA: Diagnosis not present

## 2017-06-13 DIAGNOSIS — R1031 Right lower quadrant pain: Secondary | ICD-10-CM | POA: Diagnosis not present

## 2017-06-16 ENCOUNTER — Ambulatory Visit: Payer: 59

## 2018-03-03 DIAGNOSIS — E7800 Pure hypercholesterolemia, unspecified: Secondary | ICD-10-CM | POA: Diagnosis not present

## 2018-03-03 DIAGNOSIS — Z79899 Other long term (current) drug therapy: Secondary | ICD-10-CM | POA: Diagnosis not present

## 2018-03-03 DIAGNOSIS — Z789 Other specified health status: Secondary | ICD-10-CM | POA: Diagnosis not present

## 2018-04-17 ENCOUNTER — Other Ambulatory Visit: Payer: Self-pay

## 2018-04-17 ENCOUNTER — Encounter: Payer: Self-pay | Admitting: Emergency Medicine

## 2018-04-17 ENCOUNTER — Ambulatory Visit (INDEPENDENT_AMBULATORY_CARE_PROVIDER_SITE_OTHER): Payer: Worker's Compensation

## 2018-04-17 ENCOUNTER — Ambulatory Visit
Admission: EM | Admit: 2018-04-17 | Discharge: 2018-04-17 | Disposition: A | Discharge status: Home / Self Care | Payer: Worker's Compensation | Attending: Family Medicine | Admitting: Family Medicine

## 2018-04-17 DIAGNOSIS — I10 Essential (primary) hypertension: Secondary | ICD-10-CM

## 2018-04-17 DIAGNOSIS — X58XXXA Exposure to other specified factors, initial encounter: Secondary | ICD-10-CM

## 2018-04-17 DIAGNOSIS — S2231XA Fracture of one rib, right side, initial encounter for closed fracture: Secondary | ICD-10-CM

## 2018-04-17 MED ORDER — HYDROCODONE-ACETAMINOPHEN 5-325 MG PO TABS: 1.0000 | ORAL_TABLET | Freq: Four times a day (QID) | ORAL | 0 refills | Status: AC | PRN

## 2018-04-17 MED ORDER — NAPROXEN 500 MG PO TABS: 500.0000 mg | ORAL_TABLET | Freq: Two times a day (BID) | ORAL | 0 refills | Status: DC

## 2018-04-17 NOTE — Discharge Instructions (Addendum)
Take medications as prescribed.  Avoid heavy lifting, bending, twisting, pushing and pulling.  Alternate between heat and ice.    Your blood pressure was noted to be elevated today.  Take your medications as prescribed.  Adhere to a low salt diet.  Follow-up with your primary care in about 3 days for follow-up for elevated blood pressure.

## 2018-04-17 NOTE — ED Provider Notes (Signed)
[facility]    CSN: [id number] Arrival date & time: 04/17/18  1230     History   Chief Complaint Chief Complaint  Patient presents with  . Muscle Pain    W/C DOI 04/09/18    HPI Scott [name] is a 57 y.o. male.   Subjective:  Scott [name] is a 57 y.o. male who presents for evaluation of chest wall pain. Onset was 8 days ago. Symptoms have been worsening since that time. The patient describes the pain as sharp and stabbing in the lateral chest wall: on the right. Patient rates pain as a 7/10 in intensity. Patient reports a single episode of hemoptysis 1 day after the event but none since. Aggravating factors are: coughing, deep inspiration, palpation of chest. Alleviating factors are: position change. Mechanism of injury: injured at work while moving a box. Patient states that a box with prescription labels that weight about 30 pounds fell off a palate and hit him to the lateral right chest wall. Previous visits for this problem: none. Evaluation to date: none. Treatment to date: none.  The following portions of the patient's history were reviewed and updated as appropriate: allergies, current medications, past family history, past medical history, past social history, past surgical history and problem list.       Past Medical History:  Diagnosis Date  . Hypertension     There are no active problems to display for this patient.   Past Surgical History:  Procedure Laterality Date  . CHOLECYSTECTOMY    . CHOLECYSTECTOMY  2000  . LEFT HEART CATH AND CORONARY ANGIOGRAPHY Left 06/03/2017   Procedure: LEFT HEART CATH AND CORONARY ANGIOGRAPHY;  Surgeon: Alwyn Peaallwood, Dwayne D, MD;  Location: [facility] INV[facility]IVE [facility];  Service: Cardiovascular;  Laterality: Left;       Home Medications    Prior to Admission medications   Medication Sig Start Date End Date Taking? Authorizing Provider  amLODipine (NORVASC) 5 MG tablet Take 5 mg by mouth daily. 03/31/17  Yes [provider]  isosorbide mononitrate (IMDUR) 30 MG 24 hr tablet Take 30 mg by mouth daily. 04/21/17  Yes [provider]  lisinopril-hydrochlorothiazide (PRINZIDE,ZESTORETIC) 20-25 MG per tablet Take 1 tablet by mouth daily.   Yes [provider]  lovastatin (MEVACOR) 20 MG tablet TAKE 1 TABLET (20 MG TOTAL) BY MOUTH DAILY WITH DINNER. 06/14/16  Yes [provider]  metoprolol succinate (TOPROL-XL) 25 MG 24 hr tablet Take 25 mg by mouth daily.   Yes [provider]  aspirin 81 MG tablet Take 81 mg by mouth daily.    [provider]  HYDROcodone-acetaminophen (NORCO/VICODIN) 5-325 MG tablet Take 1 tablet by mouth every 6 (six) hours as needed for up to 5 days. 04/17/18 04/22/18  Lurline IdolMurrill, Tamiki Kuba, FNP  naproxen (NAPROSYN) 500 MG tablet Take 1 tablet (500 mg total) by mouth 2 (two) times daily. 04/17/18   Lurline IdolMurrill, Jalayna Josten, FNP  predniSONE (STERAPRED UNI-PAK 21 TAB) 10 MG (21) TBPK tablet Dispense one 6 day pack. Take as directed with food. Patient not taking: Reported on 05/22/2017 10/09/16   Domenick GongMortenson, Ashley, MD  sodium chloride (OCEAN) 0.65 % SOLN nasal spray Place 1 spray into both nostrils daily as needed for congestion.    [provider]  traMADol (ULTRAM) 50 MG tablet Take 1 tablet (50 mg total) by mouth every 6 (six) hours as needed. Patient not taking: Reported on 05/22/2017 10/09/16   Domenick GongMortenson, Ashley, MD    Family History Family  History  Problem Relation Age of Onset  . Diabetes Mother   . Hypertension Mother   . Lupus Mother   . Heart attack Father   . Cancer Brother   . Cancer Brother     Social History Social History   Tobacco Use  . Smoking status: Current Every Day Smoker    Packs/day: 1.00    Types: Cigarettes  . Smokeless tobacco: Never Used  Substance Use Topics  . Alcohol use: Yes    Comment: 3-4 beers per day  . Drug use: No     Allergies   Codeine and Varenicline   Review of Systems Review of Systems   Constitutional: Negative.   Respiratory: Positive for cough. Negative for shortness of breath.   Cardiovascular: Positive for chest pain.  Neurological: Negative.   All other systems reviewed and are negative.    Physical Exam Triage Vital Signs ED Triage Vitals  Enc Vitals Group     BP 04/17/18 1318 (!) 171/103     Pulse Rate 04/17/18 1318 82     Resp 04/17/18 1318 18     Temp 04/17/18 1318 98.3 F (36.8 C)     Temp Source 04/17/18 1318 Oral     SpO2 04/17/18 1318 98 %     Weight 04/17/18 1316 162 lb (73.5 kg)     Height 04/17/18 1316 5\' 11"  (1.803 m)     Head Circumference --      Peak Flow --      Pain Score 04/17/18 1316 7     Pain Loc --      Pain Edu? --      Excl. in GC? --    No data found.  Updated Vital Signs BP (!) 150/90 (BP Location: Left Arm)   Pulse 82   Temp 98.3 F (36.8 C) (Oral)   Resp 18   Ht 5\' 11"  (1.803 m)   Wt 162 lb (73.5 kg)   SpO2 98%   BMI 22.59 kg/m   Visual Acuity Right Eye Distance:   Left Eye Distance:   Bilateral Distance:    Right Eye Near:   Left Eye Near:    Bilateral Near:     Physical Exam Constitutional:      Appearance: Normal appearance.  HENT:     Head: Normocephalic.  Neck:     Musculoskeletal: Normal range of motion and neck supple.  Cardiovascular:     Rate and Rhythm: Normal rate and regular rhythm.  Pulmonary:     Effort: Pulmonary effort is normal.  Chest:     Chest wall: Tenderness present. No mass, lacerations, deformity, swelling, crepitus or edema.  Musculoskeletal: Normal range of motion.  Skin:    General: Skin is warm and dry.  Neurological:     General: No focal deficit present.     Mental Status: He is alert and oriented to person, place, and time.  Psychiatric:        Mood and Affect: Mood normal.        Behavior: Behavior normal.        Thought Content: Thought content normal.      UC Treatments / Results  Labs (all labs ordered are listed, but only abnormal results are  displayed) Labs Reviewed - No data to display  EKG None  Radiology Dg Ribs Unilateral W/chest Right  Result Date: 04/17/2018 CLINICAL DATA:  Patient states he had a box of pharmacy labels that weigh about 30 lbs fall and hit  him on his right side. Patient c/o right side rib pain. States he coughed up blood the next day. Patient has not been seen for this injury. He states he is having significant pain when he tries to sleep at night. this happened dec 19th. Pt states he had sinus infection when this started and has had a cough prior to injured, current smoker, hx of bronchitis, heart cath with ablation as well EXAM: RIGHT RIBS AND CHEST - 3+ VIEW COMPARISON:  10/22/2011 FINDINGS: On the frontal view only, there is a mildly displaced acute fracture of the posterolateral right eighth rib. No other convincing fractures. No bone lesions. Cardiac silhouette is normal in size. No mediastinal or hilar masses. Clear lungs.  No pleural effusion or pneumothorax. IMPRESSION: 1. Acute, mildly displaced, fracture of the right posterolateral ninth rib. No other fractures. No fracture complication. Electronically Signed   By: Amie Portland M.D.   On: 04/17/2018 13:58    Procedures Procedures (including critical care time)  Medications Ordered in UC Medications - No data to display  Initial Impression / Assessment and Plan / UC Course  I have reviewed the triage vital signs and the nursing notes.  Pertinent labs & imaging results that were available during my care of the patient were reviewed by me and considered in my medical decision making (see chart for details).     57 year old male presenting with right sided chest wall pain after a 30 pound box fell onto his right side on 04/09/2018.  Patient had a single episode of hemoptysis 1 day after the event but none since.  Pain is worse with coughing, deep breathing and palpation.  He denies any shortness of breath.  Chest x-ray shows acute, mildly  displaced, fracture of the right posterolateral ninth rib without any other fractures or complications.  Supportive care advised.  Follow-up with [employer] employee health and wellness as needed.  Patient's blood pressure was also noted to be elevated.  He has a history of hypertension.  Advised to continue his medications as prescribed.  Follow-up with primary care provider next week for follow-up.  Today's evaluation has revealed no signs of a dangerous process. Discussed diagnosis with patient. Patient aware of their diagnosis, possible red flag symptoms to watch out for and need for close follow up. Patient understands verbal and written discharge instructions. Patient comfortable with plan and disposition.  Patient has a clear mental status at this time, good insight into illness (after discussion and teaching) and has clear judgment to make decisions regarding their care.  Documentation was completed with the aid of voice recognition software. Transcription may contain typographical errors. Final Clinical Impressions(s) / UC Diagnoses   Final diagnoses:  Closed fracture of one rib of right side, initial encounter  Essential hypertension     Discharge Instructions     Take medications as prescribed.  Avoid heavy lifting, bending, twisting, pushing and pulling.  Alternate between heat and ice.    Your blood pressure was noted to be elevated today.  Take your medications as prescribed.  Adhere to a low salt diet.  Follow-up with your primary care in about 3 days for follow-up for elevated blood pressure.    ED Prescriptions    Medication Sig Dispense Auth. Provider   HYDROcodone-acetaminophen (NORCO/VICODIN) 5-325 MG tablet Take 1 tablet by mouth every 6 (six) hours as needed for up to 5 days. 10 tablet Lurline Idol, FNP   naproxen (NAPROSYN) 500 MG tablet Take 1 tablet (500 mg  total) by mouth 2 (two) times daily. 30 tablet Lurline IdolMurrill, Sol Odor, FNP     Controlled Substance  Prescriptions [place] Controlled Substance Registry consulted? Yes, I have consulted the  Controlled Substances Registry for this patient, and feel the risk/benefit ratio today is favorable for proceeding with this prescription for a controlled substance.   Lurline IdolMurrill, Jelicia Nantz, [place] 04/17/18 1410

## 2018-04-17 NOTE — ED Triage Notes (Signed)
Patient states he had a box of pharmacy labels that weigh about 30 lbs fall and hit him on his right side. Patient c/o right side rib pain. States he coughed up blood the next day. Patient has not been seen for this injury. He states he is having significant pain when he tries to sleep at night.

## 2018-05-27 DIAGNOSIS — R05 Cough: Secondary | ICD-10-CM | POA: Diagnosis not present

## 2018-05-27 DIAGNOSIS — R0989 Other specified symptoms and signs involving the circulatory and respiratory systems: Secondary | ICD-10-CM | POA: Insufficient documentation

## 2018-05-27 DIAGNOSIS — R0781 Pleurodynia: Secondary | ICD-10-CM | POA: Diagnosis not present

## 2018-05-27 DIAGNOSIS — R0789 Other chest pain: Secondary | ICD-10-CM | POA: Insufficient documentation

## 2018-05-27 DIAGNOSIS — R042 Hemoptysis: Secondary | ICD-10-CM | POA: Diagnosis not present

## 2018-06-09 DIAGNOSIS — F17210 Nicotine dependence, cigarettes, uncomplicated: Secondary | ICD-10-CM | POA: Diagnosis not present

## 2018-06-09 DIAGNOSIS — R0989 Other specified symptoms and signs involving the circulatory and respiratory systems: Secondary | ICD-10-CM | POA: Diagnosis not present

## 2018-06-09 DIAGNOSIS — J449 Chronic obstructive pulmonary disease, unspecified: Secondary | ICD-10-CM | POA: Diagnosis not present

## 2018-09-01 DIAGNOSIS — I25118 Atherosclerotic heart disease of native coronary artery with other forms of angina pectoris: Secondary | ICD-10-CM | POA: Diagnosis not present

## 2018-09-01 DIAGNOSIS — I471 Supraventricular tachycardia: Secondary | ICD-10-CM | POA: Diagnosis not present

## 2018-09-01 DIAGNOSIS — Z8261 Family history of arthritis: Secondary | ICD-10-CM | POA: Diagnosis not present

## 2018-09-01 DIAGNOSIS — F17210 Nicotine dependence, cigarettes, uncomplicated: Secondary | ICD-10-CM | POA: Diagnosis not present

## 2018-09-01 DIAGNOSIS — J309 Allergic rhinitis, unspecified: Secondary | ICD-10-CM | POA: Diagnosis not present

## 2018-09-01 DIAGNOSIS — M129 Arthropathy, unspecified: Secondary | ICD-10-CM | POA: Diagnosis not present

## 2018-09-02 DIAGNOSIS — E538 Deficiency of other specified B group vitamins: Secondary | ICD-10-CM | POA: Insufficient documentation

## 2018-09-02 DIAGNOSIS — E559 Vitamin D deficiency, unspecified: Secondary | ICD-10-CM | POA: Insufficient documentation

## 2018-10-19 DIAGNOSIS — Z20828 Contact with and (suspected) exposure to other viral communicable diseases: Secondary | ICD-10-CM | POA: Diagnosis not present

## 2020-01-20 ENCOUNTER — Ambulatory Visit
Admission: RE | Admit: 2020-01-20 | Discharge: 2020-01-20 | Disposition: A | Discharge status: Home / Self Care | Payer: No Typology Code available for payment source | Source: Ambulatory Visit | Attending: Gerontology | Admitting: Gerontology

## 2020-01-20 ENCOUNTER — Other Ambulatory Visit: Payer: Self-pay | Admitting: Gerontology

## 2020-01-20 ENCOUNTER — Other Ambulatory Visit (HOSPITAL_COMMUNITY): Payer: Self-pay | Admitting: Gerontology

## 2020-01-20 ENCOUNTER — Other Ambulatory Visit: Payer: Self-pay

## 2020-01-20 DIAGNOSIS — Z125 Encounter for screening for malignant neoplasm of prostate: Secondary | ICD-10-CM | POA: Diagnosis not present

## 2020-01-20 DIAGNOSIS — M79605 Pain in left leg: Secondary | ICD-10-CM | POA: Insufficient documentation

## 2020-01-20 DIAGNOSIS — Z131 Encounter for screening for diabetes mellitus: Secondary | ICD-10-CM | POA: Diagnosis not present

## 2020-01-20 DIAGNOSIS — E7800 Pure hypercholesterolemia, unspecified: Secondary | ICD-10-CM | POA: Diagnosis not present

## 2020-01-20 DIAGNOSIS — M79604 Pain in right leg: Secondary | ICD-10-CM

## 2020-01-20 DIAGNOSIS — I25118 Atherosclerotic heart disease of native coronary artery with other forms of angina pectoris: Secondary | ICD-10-CM | POA: Diagnosis not present

## 2020-01-20 DIAGNOSIS — S2231XA Fracture of one rib, right side, initial encounter for closed fracture: Secondary | ICD-10-CM | POA: Diagnosis not present

## 2020-01-20 DIAGNOSIS — Z13220 Encounter for screening for lipoid disorders: Secondary | ICD-10-CM | POA: Diagnosis not present

## 2020-01-20 DIAGNOSIS — I10 Essential (primary) hypertension: Secondary | ICD-10-CM | POA: Diagnosis not present

## 2020-01-21 ENCOUNTER — Ambulatory Visit: Admission: RE | Admit: 2020-01-21 | Payer: No Typology Code available for payment source | Source: Ambulatory Visit

## 2020-01-21 ENCOUNTER — Ambulatory Visit
Admission: RE | Admit: 2020-01-21 | Discharge: 2020-01-21 | Disposition: A | Discharge status: Home / Self Care | Payer: No Typology Code available for payment source | Source: Ambulatory Visit | Attending: Gerontology | Admitting: Gerontology

## 2020-01-21 DIAGNOSIS — M7989 Other specified soft tissue disorders: Secondary | ICD-10-CM | POA: Diagnosis not present

## 2020-01-21 DIAGNOSIS — I743 Embolism and thrombosis of arteries of the lower extremities: Secondary | ICD-10-CM | POA: Diagnosis not present

## 2020-01-21 DIAGNOSIS — I779 Disorder of arteries and arterioles, unspecified: Secondary | ICD-10-CM | POA: Insufficient documentation

## 2020-01-21 DIAGNOSIS — M79604 Pain in right leg: Secondary | ICD-10-CM | POA: Diagnosis not present

## 2020-01-21 DIAGNOSIS — M79605 Pain in left leg: Secondary | ICD-10-CM | POA: Diagnosis not present

## 2020-01-21 DIAGNOSIS — I70203 Unspecified atherosclerosis of native arteries of extremities, bilateral legs: Secondary | ICD-10-CM | POA: Diagnosis not present

## 2020-01-21 DIAGNOSIS — I708 Atherosclerosis of other arteries: Secondary | ICD-10-CM | POA: Diagnosis not present

## 2020-02-01 ENCOUNTER — Encounter (INDEPENDENT_AMBULATORY_CARE_PROVIDER_SITE_OTHER): Payer: Self-pay | Admitting: Vascular Surgery

## 2020-02-01 ENCOUNTER — Other Ambulatory Visit: Payer: Self-pay

## 2020-02-01 ENCOUNTER — Telehealth (INDEPENDENT_AMBULATORY_CARE_PROVIDER_SITE_OTHER): Payer: Self-pay

## 2020-02-01 ENCOUNTER — Ambulatory Visit (INDEPENDENT_AMBULATORY_CARE_PROVIDER_SITE_OTHER): Payer: No Typology Code available for payment source | Admitting: Vascular Surgery

## 2020-02-01 VITALS — BP 166/87 | HR 80 | Resp 16 | Wt 171.8 lb

## 2020-02-01 DIAGNOSIS — F17210 Nicotine dependence, cigarettes, uncomplicated: Secondary | ICD-10-CM | POA: Diagnosis not present

## 2020-02-01 DIAGNOSIS — I10 Essential (primary) hypertension: Secondary | ICD-10-CM

## 2020-02-01 DIAGNOSIS — I70212 Atherosclerosis of native arteries of extremities with intermittent claudication, left leg: Secondary | ICD-10-CM | POA: Diagnosis not present

## 2020-02-01 DIAGNOSIS — R69 Illness, unspecified: Secondary | ICD-10-CM | POA: Diagnosis not present

## 2020-02-01 DIAGNOSIS — I70219 Atherosclerosis of native arteries of extremities with intermittent claudication, unspecified extremity: Secondary | ICD-10-CM | POA: Insufficient documentation

## 2020-02-01 NOTE — Assessment & Plan Note (Signed)
an arterial duplex at the hospital which I have reviewed which showed a left SFA occlusion and suggested potential inflow disease on the left side as well.  No significant disease was noted on the duplex in the right leg. Recommend:  The patient has experienced increased symptoms and is now describing lifestyle limiting claudication and mild rest pain.   Given the severity of the patient's lower extremity symptoms the patient should undergo angiography and intervention.  Risk and benefits were reviewed the patient.  Indications for the procedure were reviewed.  All questions were answered, the patient agrees to proceed.   The patient should continue walking and begin a more formal exercise program.  The patient should continue antiplatelet therapy and aggressive treatment of the lipid abnormalities  The patient will follow up with me after the angiogram.

## 2020-02-01 NOTE — Patient Instructions (Signed)
Peripheral Vascular Disease  Peripheral vascular disease (PVD) is a disease of the blood vessels that are not part of your heart and brain. A simple term for PVD is poor circulation. In most cases, PVD narrows the blood vessels that carry blood from your heart to the rest of your body. This can reduce the supply of blood to your arms, legs, and internal organs, like your stomach or kidneys. However, PVD most often affects a person's lower legs and feet. Without treatment, PVD tends to get worse. PVD can also lead to acute ischemic limb. This is when an arm or leg suddenly cannot get enough blood. This is a medical emergency. Follow these instructions at home: Lifestyle  Do not use any products that contain nicotine or tobacco, such as cigarettes and e-cigarettes. If you need help quitting, ask your doctor.  Lose weight if you are overweight. Or, stay at a healthy weight as told by your doctor.  Eat a diet that is low in fat and cholesterol. If you need help, ask your doctor.  Exercise regularly. Ask your doctor for activities that are right for you. General instructions  Take over-the-counter and prescription medicines only as told by your doctor.  Take good care of your feet: ? Wear comfortable shoes that fit well. ? Check your feet often for any cuts or sores.  Keep all follow-up visits as told by your doctor This is important. Contact a doctor if:  You have cramps in your legs when you walk.  You have leg pain when you are at rest.  You have coldness in a leg or foot.  Your skin changes.  You are unable to get or have an erection (erectile dysfunction).  You have cuts or sores on your feet that do not heal. Get help right away if:  Your arm or leg turns cold, numb, and blue.  Your arms or legs become red, warm, swollen, painful, or numb.  You have chest pain.  You have trouble breathing.  You suddenly have weakness in your face, arm, or leg.  You become very  confused or you cannot speak.  You suddenly have a very bad headache.  You suddenly cannot see. Summary  Peripheral vascular disease (PVD) is a disease of the blood vessels.  A simple term for PVD is poor circulation. Without treatment, PVD tends to get worse.  Treatment may include exercise, low fat and low cholesterol diet, and quitting smoking. This information is not intended to replace advice given to you by your health care provider. Make sure you discuss any questions you have with your health care provider. Document Revised: 03/21/2017 Document Reviewed: 05/16/2016 Elsevier Patient Education  2020 [employer]  

## 2020-02-01 NOTE — Progress Notes (Signed)
  Patient ID: Scott [name], male   DOB: 07/26/1960, 59 y.o.   MRN: [id number]  Chief Complaint  Patient presents with  . New Patient (Initial Visit)    ref Leach le occlusion    HPI Scott [name] is a 59 y.o. male.  I am asked to see the patient by S. Leach NP for evaluation of LLE arterial occlusion.  The patient describes several months of worsening left lower extremity pain with activity.  He can now basically not walk due to pain in the left leg.  The pain does begin to occur in the calf and lower leg with any activity.  He sometimes does have pain at night.  No open wounds or infections.  He does feel that his leg is cooler and darker than it has been in the past.  The right leg has some mild pain with activity but nothing major.  He does have multiple atherosclerotic risk factors including poorly controlled hypertension and ongoing tobacco use.  This prompted an evaluation by his primary care physician which included an arterial duplex at the hospital which I have reviewed which showed a left SFA occlusion and suggested potential inflow disease on the left side as well.  No significant disease was noted on the duplex in the right leg.  Given these findings, he was referred for further evaluation and treatment.  He also describes intermittent swelling in that left leg although he currently does not have any significant swelling   Past Medical History:  Diagnosis Date  . Hypertension     Past Surgical History:  Procedure Laterality Date  . CHOLECYSTECTOMY    . CHOLECYSTECTOMY  2000  . LEFT HEART CATH AND CORONARY ANGIOGRAPHY Left 06/03/2017   Procedure: LEFT HEART CATH AND CORONARY ANGIOGRAPHY;  Surgeon: Callwood, Dwayne D, MD;  Location: [facility];  Service: Cardiovascular;  Laterality: Left;     Family History  Problem Relation Age of Onset  . Diabetes Mother   . Hypertension Mother   . Lupus Mother   . Heart attack Father   . Cancer Brother   . Cancer Brother       Social History   Tobacco Use  . Smoking status: Current Every Day Smoker    Packs/day: 1.00    Types: Cigarettes  . Smokeless tobacco: Never Used  Substance Use Topics  . Alcohol use: Yes    Comment: 3-4 beers per day  . Drug use: No    Allergies  Allergen Reactions  . Acetaminophen-Codeine Other (See Comments)    Other reaction(s): Other (See Comments) Nausea and "hot flash" Nausea and "hot flash"   . Codeine Nausea Only  . Varenicline Nausea Only    Sour taste in mouth    Current Outpatient Medications  Medication Sig Dispense Refill  . amLODipine (NORVASC) 5 MG tablet Take 5 mg by mouth daily.  1  . Aspirin 325 MG CAPS Take 325 mg by mouth in the morning and at bedtime.     . Fluticasone-Salmeterol (ADVAIR DISKUS) 500-50 MCG/DOSE AEPB INHALE 1 INHALATION INTO THE LUNGS EVERY 12 (TWELVE) HOURS    . isosorbide mononitrate (IMDUR) 30 MG 24 hr tablet Take 30 mg by mouth daily.  5  . lisinopril-hydrochlorothiazide (PRINZIDE,ZESTORETIC) 20-25 MG per tablet Take 1 tablet by mouth daily.    . lovastatin (MEVACOR) 20 MG tablet TAKE 1 TABLET (20 MG TOTAL) BY MOUTH DAILY WITH DINNER.    . sodium chloride (OCEAN) 0.65 % SOLN   nasal spray Place 1 spray into both nostrils daily as needed for congestion.    . metoprolol succinate (TOPROL-XL) 25 MG 24 hr tablet Take 25 mg by mouth daily. (Patient not taking: Reported on 02/01/2020)    . naproxen (NAPROSYN) 500 MG tablet Take 1 tablet (500 mg total) by mouth 2 (two) times daily. (Patient not taking: Reported on 02/01/2020) 30 tablet 0  . predniSONE (STERAPRED UNI-PAK 21 TAB) 10 MG (21) TBPK tablet Dispense one 6 day pack. Take as directed with food. (Patient not taking: Reported on 05/22/2017) 21 tablet 0  . traMADol (ULTRAM) 50 MG tablet Take 1 tablet (50 mg total) by mouth every 6 (six) hours as needed. (Patient not taking: Reported on 05/22/2017) 20 tablet 0   No current facility-administered medications for this visit.       REVIEW OF SYSTEMS (Negative unless checked)  Constitutional: []Weight loss  []Fever  []Chills Cardiac: []Chest pain   []Chest pressure   []Palpitations   []Shortness of breath when laying flat   []Shortness of breath at rest   []Shortness of breath with exertion. Vascular:  [x]Pain in legs with walking   [x]Pain in legs at rest   []Pain in legs when laying flat   [x]Claudication   []Pain in feet when walking  []Pain in feet at rest  []Pain in feet when laying flat   []History of DVT   []Phlebitis   []Swelling in legs   []Varicose veins   []Non-healing ulcers Pulmonary:   []Uses home oxygen   []Productive cough   []Hemoptysis   []Wheeze  []COPD   []Asthma Neurologic:  []Dizziness  []Blackouts   []Seizures   []History of stroke   []History of TIA  []Aphasia   []Temporary blindness   []Dysphagia   []Weakness or numbness in arms   []Weakness or numbness in legs Musculoskeletal:  []Arthritis   []Joint swelling   []Joint pain   []Low back pain Hematologic:  []Easy bruising  []Easy bleeding   []Hypercoagulable state   []Anemic  []Hepatitis Gastrointestinal:  []Blood in stool   []Vomiting blood  []Gastroesophageal reflux/heartburn   []Abdominal pain Genitourinary:  []Chronic kidney disease   []Difficult urination  []Frequent urination  []Burning with urination   []Hematuria Skin:  []Rashes   []Ulcers   []Wounds Psychological:  []History of anxiety   [] History of major depression.    Physical Exam BP (!) 166/87 (BP Location: Right Arm)   Pulse 80   Resp 16   Wt 171 lb 12.8 oz (77.9 kg)   BMI 23.96 kg/m  Gen:  WD/WN, NAD Head: [place]/AT, No temporalis wasting.  Ear/Nose/Throat: Hearing grossly intact, nares w/o erythema or drainage, oropharynx w/o Erythema/Exudate Eyes: Conjunctiva clear, sclera non-icteric  Neck: trachea midline.  No JVD.  Pulmonary:  Good air movement, respirations not labored, no use of accessory muscles  Cardiac: RRR, no JVD Vascular: diffuse varicosities present  bilaterally Vessel Right Left  Radial Palpable Palpable                          DP  2+  not palpable  PT  2+  1+   Gastrointestinal:. No masses, surgical incisions, or scars. Musculoskeletal: M/S 5/5 throughout.  Extremities without ischemic changes.  No deformity or atrophy. No edema. Neurologic: Sensation grossly intact in extremities.  Symmetrical.  Speech is fluent. Motor exam as listed above. Psychiatric: Judgment intact, Mood & affect appropriate for pt's clinical situation. Dermatologic: No rashes or   ulcers noted.  No cellulitis or open wounds.    Radiology US Venous Img Lower Bilateral (DVT)  Result Date: 01/20/2020 CLINICAL DATA:  Bilateral leg pain EXAM: BILATERAL LOWER EXTREMITY VENOUS DOPPLER ULTRASOUND TECHNIQUE: Gray-scale sonography with graded compression, as well as color Doppler and duplex ultrasound were performed to evaluate the lower extremity deep venous systems from the level of the common femoral vein and including the common femoral, femoral, profunda femoral, popliteal and calf veins including the posterior tibial, peroneal and gastrocnemius veins when visible. The superficial great saphenous vein was also interrogated. Spectral Doppler was utilized to evaluate flow at rest and with distal augmentation maneuvers in the common femoral, femoral and popliteal veins. COMPARISON:  None. FINDINGS: RIGHT LOWER EXTREMITY Common Femoral Vein: No evidence of thrombus. Normal compressibility, respiratory phasicity and response to augmentation. Saphenofemoral Junction: No evidence of thrombus. Normal compressibility and flow on color Doppler imaging. Profunda Femoral Vein: No evidence of thrombus. Normal compressibility and flow on color Doppler imaging. Femoral Vein: No evidence of thrombus. Normal compressibility, respiratory phasicity and response to augmentation. Popliteal Vein: No evidence of thrombus. Normal compressibility, respiratory phasicity and response to  augmentation. Calf Veins: No evidence of thrombus. Normal compressibility and flow on color Doppler imaging. Superficial Great Saphenous Vein: No evidence of thrombus. Normal compressibility. Venous Reflux:  None. Other Findings: Mildly complex fluid collection within the right popliteal fossa measuring 4.8 x 1.1 x 2.4 cm, likely Baker's cyst. LEFT LOWER EXTREMITY Common Femoral Vein: No evidence of thrombus. Normal compressibility, respiratory phasicity and response to augmentation. Saphenofemoral Junction: No evidence of thrombus. Normal compressibility and flow on color Doppler imaging. Profunda Femoral Vein: No evidence of thrombus. Normal compressibility and flow on color Doppler imaging. Femoral Vein: No evidence of thrombus. Normal compressibility, respiratory phasicity and response to augmentation. Popliteal Vein: No evidence of thrombus. Normal compressibility, respiratory phasicity and response to augmentation. Calf Veins: No evidence of thrombus. Normal compressibility and flow on color Doppler imaging. Superficial Great Saphenous Vein: No evidence of thrombus. Normal compressibility. Venous Reflux:  None. Other Findings: Mildly complex fluid collection within the left popliteal fossa measuring 4.3 x 1.6 x 1.2 cm, likely Baker's cyst. IMPRESSION: 1. No evidence of deep venous thrombosis in either lower extremity. 2. 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 Electronically Signed   By: Nicholas  Plundo D.O.   On: 01/20/2020 17:11   US ARTERIAL LOWER EXTREMITY DUPLEX BILATERAL  Result Date: 01/21/2020 CLINICAL DATA:  58-year-old with bilateral leg pain and swelling. EXAM: BILATERAL LOWER EXTREMITY ARTERIAL DUPLEX SCAN TECHNIQUE: Gray-scale sonography as well as color Doppler and duplex ultrasound was performed to evaluate the arteries of both lower extremities including the common, superficial and profunda femoral arteries, popliteal artery and calf arteries. COMPARISON:  Bilateral venous  duplex examination 01/20/2020 FINDINGS: Right Lower Extremity ABI: Not obtained Inflow: Normal common femoral arterial waveforms and velocities. No evidence of inflow (aortoiliac) disease. Atherosclerotic plaque in the right common femoral artery. Outflow: Normal profunda femoral, superficial femoral and popliteal arterial waveforms and velocities. No focal elevation of the PSV to suggest stenosis. Atherosclerotic plaque in the right SFA. Atherosclerotic plaque in the right popliteal artery. Runoff: Normal posterior and anterior tibial arterial waveforms and velocities. Left Lower Extremity ABI: Not obtained Inflow: Atherosclerotic plaque in left common femoral artery. Monophasic waveforms. Outflow: Left profunda femoral artery is patent. Proximal left SFA is occluded. Left mid and distal SFA are patent. Extensive atherosclerotic plaque in the left SFA. Extensive plaque in the left popliteal artery with   monophasic waveforms. Runoff: Left posterior tibial artery is patent with monophasic waveforms. Left anterior tibial artery is patent with monophasic waveforms. IMPRESSION: 1. Atherosclerotic plaque involving bilateral lower extremity arteries. 2. Left outflow disease. Occlusion of the proximal left superficial femoral artery. 3. Concern for left inflow disease demonstrated by monophasic waveforms in the left common femoral artery. 4. No focal stenosis or occlusion in the right lower extremity arteries. Electronically Signed   By: Adam  Henn M.D.   On: 01/21/2020 13:30    Labs No results found for this or any previous visit (from the past 2160 hour(s)).  Assessment/Plan:  Atherosclerosis of native arteries of extremity with intermittent claudication (HCC) an arterial duplex at the hospital which I have reviewed which showed a left SFA occlusion and suggested potential inflow disease on the left side as well.  No significant disease was noted on the duplex in the right leg. Recommend:  The patient has  experienced increased symptoms and is now describing lifestyle limiting claudication and mild rest pain.   Given the severity of the patient's lower extremity symptoms the patient should undergo angiography and intervention.  Risk and benefits were reviewed the patient.  Indications for the procedure were reviewed.  All questions were answered, the patient agrees to proceed.   The patient should continue walking and begin a more formal exercise program.  The patient should continue antiplatelet therapy and aggressive treatment of the lipid abnormalities  The patient will follow up with me after the angiogram.   Hypertension Sounds like control has been suboptimal and blood pressure control important in reducing the progression of atherosclerotic disease. On appropriate oral medications now.   Cigarette smoker We discussed this is a primary atherosclerotic risk factor and smoking cessation would reduce his risk of atherosclerotic disease.  It also affects the patency of any intervention.  Smoking cessation strongly recommended.      Mai Longnecker 02/01/2020, 1:14 PM   This note was created with Dragon medical transcription system.  Any errors from dictation are unintentional.   

## 2020-02-01 NOTE — Assessment & Plan Note (Signed)
Sounds like control has been suboptimal and blood pressure control important in reducing the progression of atherosclerotic disease. On appropriate oral medications now.

## 2020-02-01 NOTE — H&P (View-Only) (Signed)
Patient ID: Scott [name], male   DOB: 06-Apr-1961, 59 y.o.   MRN: [id number]  Chief Complaint  Patient presents with  . New Patient (Initial Visit)    ref Etheleen Mayhew le occlusion    HPI Scott [name] is a 59 y.o. male.  I am asked to see the patient by Sherley Bounds NP for evaluation of LLE arterial occlusion.  The patient describes several months of worsening left lower extremity pain with activity.  He can now basically not walk due to pain in the left leg.  The pain does begin to occur in the calf and lower leg with any activity.  He sometimes does have pain at night.  No open wounds or infections.  He does feel that his leg is cooler and darker than it has been in the past.  The right leg has some mild pain with activity but nothing major.  He does have multiple atherosclerotic risk factors including poorly controlled hypertension and ongoing tobacco use.  This prompted an evaluation by his primary care physician which included an arterial duplex at the hospital which I have reviewed which showed a left SFA occlusion and suggested potential inflow disease on the left side as well.  No significant disease was noted on the duplex in the right leg.  Given these findings, he was referred for further evaluation and treatment.  He also describes intermittent swelling in that left leg although he currently does not have any significant swelling   Past Medical History:  Diagnosis Date  . Hypertension     Past Surgical History:  Procedure Laterality Date  . CHOLECYSTECTOMY    . CHOLECYSTECTOMY  2000  . LEFT HEART CATH AND CORONARY ANGIOGRAPHY Left 06/03/2017   Procedure: LEFT HEART CATH AND CORONARY ANGIOGRAPHY;  Surgeon: Alwyn Pea, MD;  Location: [facility];  Service: Cardiovascular;  Laterality: Left;     Family History  Problem Relation Age of Onset  . Diabetes Mother   . Hypertension Mother   . Lupus Mother   . Heart attack Father   . Cancer Brother   . Cancer Brother       Social History   Tobacco Use  . Smoking status: Current Every Day Smoker    Packs/day: 1.00    Types: Cigarettes  . Smokeless tobacco: Never Used  Substance Use Topics  . Alcohol use: Yes    Comment: 3-4 beers per day  . Drug use: No    Allergies  Allergen Reactions  . Acetaminophen-Codeine Other (See Comments)    Other reaction(s): Other (See Comments) Nausea and "hot flash" Nausea and "hot flash"   . Codeine Nausea Only  . Varenicline Nausea Only    Sour taste in mouth    Current Outpatient Medications  Medication Sig Dispense Refill  . amLODipine (NORVASC) 5 MG tablet Take 5 mg by mouth daily.  1  . Aspirin 325 MG CAPS Take 325 mg by mouth in the morning and at bedtime.     . Fluticasone-Salmeterol (ADVAIR DISKUS) 500-50 MCG/DOSE AEPB INHALE 1 INHALATION INTO THE LUNGS EVERY 12 (TWELVE) HOURS    . isosorbide mononitrate (IMDUR) 30 MG 24 hr tablet Take 30 mg by mouth daily.  5  . lisinopril-hydrochlorothiazide (PRINZIDE,ZESTORETIC) 20-25 MG per tablet Take 1 tablet by mouth daily.    Marland Kitchen lovastatin (MEVACOR) 20 MG tablet TAKE 1 TABLET (20 MG TOTAL) BY MOUTH DAILY WITH DINNER.    Marland Kitchen sodium chloride (OCEAN) 0.65 % SOLN  nasal spray Place 1 spray into both nostrils daily as needed for congestion.    . metoprolol succinate (TOPROL-XL) 25 MG 24 hr tablet Take 25 mg by mouth daily. (Patient not taking: Reported on 02/01/2020)    . naproxen (NAPROSYN) 500 MG tablet Take 1 tablet (500 mg total) by mouth 2 (two) times daily. (Patient not taking: Reported on 02/01/2020) 30 tablet 0  . predniSONE (STERAPRED UNI-PAK 21 TAB) 10 MG (21) TBPK tablet Dispense one 6 day pack. Take as directed with food. (Patient not taking: Reported on 05/22/2017) 21 tablet 0  . traMADol (ULTRAM) 50 MG tablet Take 1 tablet (50 mg total) by mouth every 6 (six) hours as needed. (Patient not taking: Reported on 05/22/2017) 20 tablet 0   No current facility-administered medications for this visit.       REVIEW OF SYSTEMS (Negative unless checked)  Constitutional: [] Weight loss  [] Fever  [] Chills Cardiac: [] Chest pain   [] Chest pressure   [] Palpitations   [] Shortness of breath when laying flat   [] Shortness of breath at rest   [] Shortness of breath with exertion. Vascular:  [x] Pain in legs with walking   [x] Pain in legs at rest   [] Pain in legs when laying flat   [x] Claudication   [] Pain in feet when walking  [] Pain in feet at rest  [] Pain in feet when laying flat   [] History of DVT   [] Phlebitis   [] Swelling in legs   [] Varicose veins   [] Non-healing ulcers Pulmonary:   [] Uses home oxygen   [] Productive cough   [] Hemoptysis   [] Wheeze  [] COPD   [] Asthma Neurologic:  [] Dizziness  [] Blackouts   [] Seizures   [] History of stroke   [] History of TIA  [] Aphasia   [] Temporary blindness   [] Dysphagia   [] Weakness or numbness in arms   [] Weakness or numbness in legs Musculoskeletal:  [] Arthritis   [] Joint swelling   [] Joint pain   [] Low back pain Hematologic:  [] Easy bruising  [] Easy bleeding   [] Hypercoagulable state   [] Anemic  [] Hepatitis Gastrointestinal:  [] Blood in stool   [] Vomiting blood  [] Gastroesophageal reflux/heartburn   [] Abdominal pain Genitourinary:  [] Chronic kidney disease   [] Difficult urination  [] Frequent urination  [] Burning with urination   [] Hematuria Skin:  [] Rashes   [] Ulcers   [] Wounds Psychological:  [] History of anxiety   []  History of major depression.    Physical Exam BP (!) 166/87 (BP Location: Right Arm)   Pulse 80   Resp 16   Wt 171 lb 12.8 oz (77.9 kg)   BMI 23.96 kg/m  Gen:  WD/WN, NAD Head: Oval/AT, No temporalis wasting.  Ear/Nose/Throat: Hearing grossly intact, nares w/o erythema or drainage, oropharynx w/o Erythema/Exudate Eyes: Conjunctiva clear, sclera non-icteric  Neck: trachea midline.  No JVD.  Pulmonary:  Good air movement, respirations not labored, no use of accessory muscles  Cardiac: RRR, no JVD Vascular: diffuse varicosities present  bilaterally Vessel Right Left  Radial Palpable Palpable                          DP  2+  not palpable  PT  2+  1+   Gastrointestinal:. No masses, surgical incisions, or scars. Musculoskeletal: M/S 5/5 throughout.  Extremities without ischemic changes.  No deformity or atrophy. No edema. Neurologic: Sensation grossly intact in extremities.  Symmetrical.  Speech is fluent. Motor exam as listed above. Psychiatric: Judgment intact, Mood & affect appropriate for pt's clinical situation. Dermatologic: No rashes or  ulcers noted.  No cellulitis or open wounds.    Radiology US Venous Img Lower Bilateral (DVT)  Result Date: 01/20/2020 CLINICAL DATA:  Bilateral leg pain EXAM: BILATERAL LOWER EXTREMITY VENOUS DOPPLER ULTRASOUND TECHNIQUE: Gray-scale sonography with graded compression, as well as color Doppler and duplex ultrasound were performed to evaluate the lower extremity deep venous systems from the level of the common femoral vein and including the common femoral, femoral, profunda femoral, popliteal and calf veins including the posterior tibial, peroneal and gastrocnemius veins when visible. The superficial great saphenous vein was also interrogated. Spectral Doppler was utilized to evaluate flow at rest and with distal augmentation maneuvers in the common femoral, femoral and popliteal veins. COMPARISON:  None. FINDINGS: RIGHT LOWER EXTREMITY Common Femoral Vein: No evidence of thrombus. Normal compressibility, respiratory phasicity and response to augmentation. Saphenofemoral Junction: No evidence of thrombus. Normal compressibility and flow on color Doppler imaging. Profunda Femoral Vein: No evidence of thrombus. Normal compressibility and flow on color Doppler imaging. Femoral Vein: No evidence of thrombus. Normal compressibility, respiratory phasicity and response to augmentation. Popliteal Vein: No evidence of thrombus. Normal compressibility, respiratory phasicity and response to  augmentation. Calf Veins: No evidence of thrombus. Normal compressibility and flow on color Doppler imaging. Superficial Great Saphenous Vein: No evidence of thrombus. Normal compressibility. Venous Reflux:  None. Other Findings: Mildly complex fluid collection within the right popliteal fossa measuring 4.8 x 1.1 x 2.4 cm, likely Baker's cyst. LEFT LOWER EXTREMITY Common Femoral Vein: No evidence of thrombus. Normal compressibility, respiratory phasicity and response to augmentation. Saphenofemoral Junction: No evidence of thrombus. Normal compressibility and flow on color Doppler imaging. Profunda Femoral Vein: No evidence of thrombus. Normal compressibility and flow on color Doppler imaging. Femoral Vein: No evidence of thrombus. Normal compressibility, respiratory phasicity and response to augmentation. Popliteal Vein: No evidence of thrombus. Normal compressibility, respiratory phasicity and response to augmentation. Calf Veins: No evidence of thrombus. Normal compressibility and flow on color Doppler imaging. Superficial Great Saphenous Vein: No evidence of thrombus. Normal compressibility. Venous Reflux:  None. Other Findings: Mildly complex fluid collection within the left popliteal fossa measuring 4.3 x 1.6 x 1.2 cm, likely Baker's cyst. IMPRESSION: 1. No evidence of deep venous thrombosis in either lower extremity. 2. 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 Electronically Signed   By: Duanne Guess D.O.   On: 01/20/2020 17:11   US ARTERIAL LOWER EXTREMITY DUPLEX BILATERAL  Result Date: 01/21/2020 CLINICAL DATA:  59 year old with bilateral leg pain and swelling. EXAM: BILATERAL LOWER EXTREMITY ARTERIAL DUPLEX SCAN TECHNIQUE: Gray-scale sonography as well as color Doppler and duplex ultrasound was performed to evaluate the arteries of both lower extremities including the common, superficial and profunda femoral arteries, popliteal artery and calf arteries. COMPARISON:  Bilateral venous  duplex examination 01/20/2020 FINDINGS: Right Lower Extremity ABI: Not obtained Inflow: Normal common femoral arterial waveforms and velocities. No evidence of inflow (aortoiliac) disease. Atherosclerotic plaque in the right common femoral artery. Outflow: Normal profunda femoral, superficial femoral and popliteal arterial waveforms and velocities. No focal elevation of the PSV to suggest stenosis. Atherosclerotic plaque in the right SFA. Atherosclerotic plaque in the right popliteal artery. Runoff: Normal posterior and anterior tibial arterial waveforms and velocities. Left Lower Extremity ABI: Not obtained Inflow: Atherosclerotic plaque in left common femoral artery. Monophasic waveforms. Outflow: Left profunda femoral artery is patent. Proximal left SFA is occluded. Left mid and distal SFA are patent. Extensive atherosclerotic plaque in the left SFA. Extensive plaque in the left popliteal artery with  monophasic waveforms. Runoff: Left posterior tibial artery is patent with monophasic waveforms. Left anterior tibial artery is patent with monophasic waveforms. IMPRESSION: 1. Atherosclerotic plaque involving bilateral lower extremity arteries. 2. Left outflow disease. Occlusion of the proximal left superficial femoral artery. 3. Concern for left inflow disease demonstrated by monophasic waveforms in the left common femoral artery. 4. No focal stenosis or occlusion in the right lower extremity arteries. Electronically Signed   By: Richarda Overlie M.D.   On: 01/21/2020 13:30    Labs No results found for this or any previous visit (from the past 2160 hour(s)).  Assessment/Plan:  Atherosclerosis of native arteries of extremity with intermittent claudication (HCC) an arterial duplex at the hospital which I have reviewed which showed a left SFA occlusion and suggested potential inflow disease on the left side as well.  No significant disease was noted on the duplex in the right leg. Recommend:  The patient has  experienced increased symptoms and is now describing lifestyle limiting claudication and mild rest pain.   Given the severity of the patient's lower extremity symptoms the patient should undergo angiography and intervention.  Risk and benefits were reviewed the patient.  Indications for the procedure were reviewed.  All questions were answered, the patient agrees to proceed.   The patient should continue walking and begin a more formal exercise program.  The patient should continue antiplatelet therapy and aggressive treatment of the lipid abnormalities  The patient will follow up with me after the angiogram.   Hypertension Sounds like control has been suboptimal and blood pressure control important in reducing the progression of atherosclerotic disease. On appropriate oral medications now.   Cigarette smoker We discussed this is a primary atherosclerotic risk factor and smoking cessation would reduce his risk of atherosclerotic disease.  It also affects the patency of any intervention.  Smoking cessation strongly recommended.      Festus Barren 02/01/2020, 1:14 PM   This note was created with Dragon medical transcription system.  Any errors from dictation are unintentional.

## 2020-02-01 NOTE — Assessment & Plan Note (Signed)
We discussed this is a primary atherosclerotic risk factor and smoking cessation would reduce his risk of atherosclerotic disease.  It also affects the patency of any intervention.  Smoking cessation strongly recommended.

## 2020-02-01 NOTE — Telephone Encounter (Signed)
Spoke with the patient and he is scheduled for a left leg angio on 02/03/20 with Dr. Wyn Quaker with a 11:00 am arrival time to the [facility]. Covid testing on 02/02/20 before 11:00 am at the [facility]. Pre-procedure instructions were discussed and patient understood.

## 2020-02-02 ENCOUNTER — Other Ambulatory Visit (INDEPENDENT_AMBULATORY_CARE_PROVIDER_SITE_OTHER): Payer: Self-pay | Admitting: Nurse Practitioner

## 2020-02-02 ENCOUNTER — Other Ambulatory Visit
Admission: RE | Admit: 2020-02-02 | Discharge: 2020-02-02 | Disposition: A | Discharge status: Home / Self Care | Payer: No Typology Code available for payment source | Source: Ambulatory Visit | Attending: Vascular Surgery | Admitting: Vascular Surgery

## 2020-02-02 DIAGNOSIS — Z01812 Encounter for preprocedural laboratory examination: Secondary | ICD-10-CM | POA: Insufficient documentation

## 2020-02-02 DIAGNOSIS — Z20822 Contact with and (suspected) exposure to covid-19: Secondary | ICD-10-CM | POA: Insufficient documentation

## 2020-02-03 ENCOUNTER — Encounter: Payer: Self-pay | Admitting: Vascular Surgery

## 2020-02-03 ENCOUNTER — Other Ambulatory Visit: Payer: Self-pay

## 2020-02-03 ENCOUNTER — Encounter
Admission: RE | Disposition: A | Discharge status: Home / Self Care | Payer: Self-pay | Source: Home / Self Care | Attending: Vascular Surgery

## 2020-02-03 ENCOUNTER — Ambulatory Visit
Admission: RE | Admit: 2020-02-03 | Discharge: 2020-02-03 | Disposition: A | Discharge status: Home / Self Care | Payer: No Typology Code available for payment source | Attending: Vascular Surgery | Admitting: Vascular Surgery

## 2020-02-03 DIAGNOSIS — R69 Illness, unspecified: Secondary | ICD-10-CM | POA: Diagnosis not present

## 2020-02-03 DIAGNOSIS — I70222 Atherosclerosis of native arteries of extremities with rest pain, left leg: Secondary | ICD-10-CM | POA: Diagnosis not present

## 2020-02-03 DIAGNOSIS — Z885 Allergy status to narcotic agent status: Secondary | ICD-10-CM | POA: Diagnosis not present

## 2020-02-03 DIAGNOSIS — F17210 Nicotine dependence, cigarettes, uncomplicated: Secondary | ICD-10-CM | POA: Insufficient documentation

## 2020-02-03 DIAGNOSIS — Z79899 Other long term (current) drug therapy: Secondary | ICD-10-CM | POA: Insufficient documentation

## 2020-02-03 DIAGNOSIS — I10 Essential (primary) hypertension: Secondary | ICD-10-CM | POA: Insufficient documentation

## 2020-02-03 DIAGNOSIS — Z7982 Long term (current) use of aspirin: Secondary | ICD-10-CM | POA: Diagnosis not present

## 2020-02-03 DIAGNOSIS — I714 Abdominal aortic aneurysm, without rupture: Secondary | ICD-10-CM | POA: Diagnosis not present

## 2020-02-03 DIAGNOSIS — I70219 Atherosclerosis of native arteries of extremities with intermittent claudication, unspecified extremity: Secondary | ICD-10-CM

## 2020-02-03 HISTORY — DX: Cardiac arrhythmia, unspecified: I49.9

## 2020-02-03 HISTORY — PX: LOWER EXTREMITY ANGIOGRAPHY: CATH118251

## 2020-02-03 HISTORY — DX: Chronic obstructive pulmonary disease, unspecified: J44.9

## 2020-02-03 HISTORY — DX: Atherosclerotic heart disease of native coronary artery without angina pectoris: I25.10

## 2020-02-03 LAB — SARS CORONAVIRUS 2 (TAT 6-24 HRS): SARS Coronavirus 2: NEGATIVE

## 2020-02-03 SURGERY — LOWER EXTREMITY ANGIOGRAPHY
Anesthesia: Moderate Sedation | Site: Leg Lower | Laterality: Left

## 2020-02-03 MED ORDER — MIDAZOLAM HCL 2 MG/ML PO SYRP: 8.0000 mg | ORAL_SOLUTION | Freq: Once | ORAL | Status: DC | PRN

## 2020-02-03 MED ORDER — SODIUM CHLORIDE 0.9% FLUSH: 3.0000 mL | INTRAVENOUS | Status: DC | PRN

## 2020-02-03 MED ORDER — MIDAZOLAM HCL 2 MG/2ML IJ SOLN
INTRAMUSCULAR | Status: DC | PRN
  Administered 2020-02-03: 2 mg via INTRAVENOUS
  Administered 2020-02-03 (×3): 1 mg via INTRAVENOUS

## 2020-02-03 MED ORDER — ATORVASTATIN CALCIUM 10 MG PO TABS
10.0000 mg | ORAL_TABLET | Freq: Every day | ORAL | Status: DC
  Filled 2020-02-03: qty 1

## 2020-02-03 MED ORDER — SODIUM CHLORIDE 0.9% FLUSH: 3.0000 mL | Freq: Two times a day (BID) | INTRAVENOUS | Status: DC

## 2020-02-03 MED ORDER — ACETAMINOPHEN 325 MG PO TABS: 650.0000 mg | ORAL_TABLET | ORAL | Status: DC | PRN

## 2020-02-03 MED ORDER — FAMOTIDINE 20 MG PO TABS: 40.0000 mg | ORAL_TABLET | Freq: Once | ORAL | Status: DC | PRN

## 2020-02-03 MED ORDER — FENTANYL CITRATE (PF) 100 MCG/2ML IJ SOLN
INTRAMUSCULAR | Status: AC
  Filled 2020-02-03: qty 2

## 2020-02-03 MED ORDER — ONDANSETRON HCL 4 MG/2ML IJ SOLN: 4.0000 mg | Freq: Four times a day (QID) | INTRAMUSCULAR | Status: DC | PRN

## 2020-02-03 MED ORDER — ASPIRIN EC 81 MG PO TBEC
DELAYED_RELEASE_TABLET | ORAL | Status: AC
  Filled 2020-02-03: qty 1

## 2020-02-03 MED ORDER — METHYLPREDNISOLONE SODIUM SUCC 125 MG IJ SOLR: 125.0000 mg | Freq: Once | INTRAMUSCULAR | Status: DC | PRN

## 2020-02-03 MED ORDER — CLOPIDOGREL BISULFATE 75 MG PO TABS: 75.0000 mg | ORAL_TABLET | Freq: Every day | ORAL | 11 refills | Status: DC

## 2020-02-03 MED ORDER — ASPIRIN EC 81 MG PO TBEC: 81.0000 mg | DELAYED_RELEASE_TABLET | Freq: Every day | ORAL | 2 refills | Status: DC

## 2020-02-03 MED ORDER — CLOPIDOGREL BISULFATE 75 MG PO TABS: 75.0000 mg | ORAL_TABLET | Freq: Every day | ORAL | Status: DC

## 2020-02-03 MED ORDER — FENTANYL CITRATE (PF) 100 MCG/2ML IJ SOLN: 12.5000 ug | Freq: Once | INTRAMUSCULAR | Status: DC | PRN

## 2020-02-03 MED ORDER — LABETALOL HCL 5 MG/ML IV SOLN: 10.0000 mg | INTRAVENOUS | Status: DC | PRN

## 2020-02-03 MED ORDER — SODIUM CHLORIDE 0.9 % IV SOLN: 250.0000 mL | INTRAVENOUS | Status: DC | PRN

## 2020-02-03 MED ORDER — IODIXANOL 320 MG/ML IV SOLN
INTRAVENOUS | Status: DC | PRN
  Administered 2020-02-03: 65 mL

## 2020-02-03 MED ORDER — ASPIRIN EC 81 MG PO TBEC
81.0000 mg | DELAYED_RELEASE_TABLET | Freq: Every day | ORAL | Status: DC
  Administered 2020-02-03: 81 mg via ORAL

## 2020-02-03 MED ORDER — CEFAZOLIN SODIUM-DEXTROSE 2-4 GM/100ML-% IV SOLN
INTRAVENOUS | Status: AC
  Administered 2020-02-03: 2 g via INTRAVENOUS
  Filled 2020-02-03: qty 100

## 2020-02-03 MED ORDER — HYDRALAZINE HCL 20 MG/ML IJ SOLN: 5.0000 mg | INTRAMUSCULAR | Status: DC | PRN

## 2020-02-03 MED ORDER — HEPARIN SODIUM (PORCINE) 1000 UNIT/ML IJ SOLN
INTRAMUSCULAR | Status: DC | PRN
  Administered 2020-02-03: 5000 [IU] via INTRAVENOUS

## 2020-02-03 MED ORDER — FENTANYL CITRATE (PF) 100 MCG/2ML IJ SOLN
INTRAMUSCULAR | Status: DC | PRN
Start: 2020-02-03 — End: 2020-02-03
  Administered 2020-02-03: 25 ug via INTRAVENOUS
  Administered 2020-02-03: 50 ug via INTRAVENOUS
  Administered 2020-02-03 (×2): 25 ug via INTRAVENOUS

## 2020-02-03 MED ORDER — MIDAZOLAM HCL 5 MG/5ML IJ SOLN
INTRAMUSCULAR | Status: AC
  Filled 2020-02-03: qty 5

## 2020-02-03 MED ORDER — SODIUM CHLORIDE 0.9 % IV SOLN: INTRAVENOUS | Status: DC

## 2020-02-03 MED ORDER — DIPHENHYDRAMINE HCL 50 MG/ML IJ SOLN: 50.0000 mg | Freq: Once | INTRAMUSCULAR | Status: DC | PRN

## 2020-02-03 MED ORDER — CEFAZOLIN SODIUM-DEXTROSE 2-4 GM/100ML-% IV SOLN: 2.0000 g | Freq: Once | INTRAVENOUS | Status: AC

## 2020-02-03 MED ORDER — ATORVASTATIN CALCIUM 10 MG PO TABS: 10.0000 mg | ORAL_TABLET | Freq: Every day | ORAL | 11 refills | Status: DC

## 2020-02-03 MED ORDER — HEPARIN SODIUM (PORCINE) 1000 UNIT/ML IJ SOLN
INTRAMUSCULAR | Status: AC
  Filled 2020-02-03: qty 1

## 2020-02-03 SURGICAL SUPPLY — 22 items
BALLN DORADO 5X200X135 (BALLOONS) ×2
BALLN LUTONIX 018 5X300X130 (BALLOONS) ×2
BALLN ULTRVRSE 3X300X150 (BALLOONS) ×1
BALLN ULTRVRSE 3X300X150 OTW (BALLOONS) ×1
BALLOON DORADO 5X200X135 (BALLOONS) ×1 IMPLANT
BALLOON LUTONIX 018 5X300X130 (BALLOONS) ×1 IMPLANT
BALLOON ULTRVRSE 3X300X150 OTW (BALLOONS) ×1 IMPLANT
CATH ANGIO 5F PIGTAIL 65CM (CATHETERS) ×2 IMPLANT
CATH BEACON 5 .038 100 VERT TP (CATHETERS) ×2 IMPLANT
CATH CXI 4F 90 DAV (CATHETERS) ×2 IMPLANT
DEVICE STARCLOSE SE CLOSURE (Vascular Products) ×2 IMPLANT
DEVICE TORQUE .025-.038 (MISCELLANEOUS) ×2 IMPLANT
GLIDEWIRE ADV .035X260CM (WIRE) ×2 IMPLANT
KIT ENCORE 26 ADVANTAGE (KITS) ×2 IMPLANT
PACK ANGIOGRAPHY (CUSTOM PROCEDURE TRAY) ×2 IMPLANT
SHEATH ANL2 6FRX45 HC (SHEATH) ×2 IMPLANT
SHEATH BRITE TIP 5FRX11 (SHEATH) ×2 IMPLANT
STENT VIABAHN 6X150X120 (Permanent Stent) ×2 IMPLANT
STENT VIABAHN 6X7.5X120 (Permanent Stent) ×2 IMPLANT
TUBING CONTRAST HIGH PRESS 72 (TUBING) ×4 IMPLANT
WIRE G V18X300CM (WIRE) ×2 IMPLANT
WIRE J 3MM .035X145CM (WIRE) ×2 IMPLANT

## 2020-02-03 NOTE — Op Note (Signed)
[facility]  Percutaneous Study/Intervention Procedural Note   Date of Surgery: 02/03/2020  Surgeon(s):Rei Medlen    Assistants:none  Pre-operative Diagnosis: PAD with claudication and early rest Scott [name] LLE  Post-operative diagnosis:  Same with AAA as well  Procedure(s) Performed:             1.  Ultrasound guidance for vascular access right femoral artery             2.  Catheter placement into left common femoral artery from right femoral approach             3.  Aortogram and selective left lower extremity angiogram             4.  Percutaneous transluminal angioplasty of left anterior tibial artery with 3 mm balloon             5.   Percutaneous transluminal angioplasty of the left SFA and most proximal popliteal artery with 5 mm diameter by 30 cm length Lutonix drug-coated angioplasty balloon  6.  Viabahn stent placement x2 to the left SFA with a 6 mm diameter by 15 cm length stent and a 6 mm diameter by 7.5 cm length stent             7.  StarClose closure device right femoral artery  EBL: 5 cc  Contrast: 65 cc  Fluoro Time: 16.5 minutes  Moderate Conscious Sedation Time: approximately 76 minutes using 5 mg of Versed and 125 mcg of Fentanyl              Indications:  Patient is a 59 y.o.male with disabling claudication and some rest Scott [name] symptoms of the left leg. The patient has noninvasive study showing a left SFA occlusion. The patient is brought in for angiography for further evaluation and potential treatment.  Due to the limb threatening nature of the situation, angiogram was performed for attempted limb salvage. The patient is aware that if the procedure fails, amputation would be expected.  The patient also understands that even with successful revascularization, amputation may still be required due to the severity of the situation.  Risks and benefits are discussed and informed consent is obtained.   Procedure:  The patient was identified and  appropriate procedural time out was performed.  The patient was then placed supine on the table and prepped and draped in the usual sterile fashion. Moderate conscious sedation was administered during a face to face encounter with the patient throughout the procedure with my supervision of the RN administering medicines and monitoring the patient's vital signs, pulse oximetry, telemetry and mental status throughout from the start of the procedure until the patient was taken to the recovery room. Ultrasound was used to evaluate the right common femoral artery.  It was patent .  A digital ultrasound image was acquired.  A Seldinger needle was used to access the right common femoral artery under direct ultrasound guidance and a permanent image was performed.  A 0.035 J wire was advanced without resistance and a 5Fr sheath was placed.  Pigtail catheter was placed into the aorta and an AP aortogram was performed. This demonstrated an aneurysmal abdominal aorta, iliacs are heavily calcified with at least mild disease.  Both renal arteries appear to be patent without high-grade stenosis. I then crossed the aortic bifurcation and advanced to the left femoral head. Selective left lower extremity angiogram was then performed. This demonstrated heavily diseased distal common femoral artery with a high-grade stenosis of  the proximal profunda femoris artery and a flush occlusion of the left SFA.  There is a relatively long segment occlusion of about 20 to 25 cm with reconstitution of the distal SFA or most proximal popliteal artery.  The remainder the popliteal artery was fairly normal.  There was a typical tibial trifurcation with two-vessel runoff through both the posterior tibial and anterior tibial arteries although the proximal anterior tibial artery had moderate stenosis in the 60 to 70% range. It was felt that it was in the patient's best interest to proceed with intervention after these images to avoid a second procedure  and a larger amount of contrast and fluoroscopy based off of the findings from the initial angiogram. The patient was systemically heparinized and a 6 [place] Ansell sheath was then placed over the [employer] wire. I then used a Kumpe catheter and the advantage wire to navigate through the SFA occlusion tediously.  I had to exchange for a CXI catheter because the Kumpe catheter would not cross the very calcified lesion.  Ultimately, we got down to confirm intraluminal flow in the popliteal artery and exchanged for a 0.018 wire.  I used a 3 mm diameter by 30 cm length angioplasty balloon to treat the proximal anterior tibial artery with an inflation up to 6 atm for 1 minute.  I also use this balloon to predilate the heavily calcified SFA lesion.  I then definitively treated the SFA with a 5 mm diameter by 30 cm length Lutonix drug-coated angioplasty balloon inflated to 12 atm for 1 minute.  Completion imaging showed greater than 50% residual stenosis throughout much of the proximal to mid SFA and I elected to place stents.  A 6 mm diameter by 15 cm length Viabahn stent was deployed from the mid to distal SFA up to the proximal SFA.  A 6 mm diameter by 7.5 cm length stent was then deployed up to the origin of the SFA with slight overlap.  These were postdilated with 5 mm balloon inflated to 24 atm.  Completion imaging showed less than 10% residual stenosis throughout the SFA stents.  There was still significant residual disease in the profunda femoris artery and heavily calcific distal common femoral artery, but if he continues to have symptoms, femoral endarterectomy will need to be considered.  Imaging through the right femoral sheath showed similar disease in the distal right common femoral artery spilling into the origins of the SFA and profunda femoris artery more so in the SFA.  This was below the access site. I elected to terminate the procedure. The sheath was removed and StarClose closure device was  deployed in the right femoral artery with excellent hemostatic result. The patient was taken to the recovery room in stable condition having tolerated the procedure well.  Findings:               Aortogram:  aneurysmal abdominal aorta, iliacs are heavily calcified with at least mild disease.  Both renal arteries appear to be patent without high-grade stenosis.             Left Lower Extremity:  This demonstrated heavily diseased distal common femoral artery with a high-grade stenosis of the proximal profunda femoris artery and a flush occlusion of the left SFA.  There is a relatively long segment occlusion of about 20 to 25 cm with reconstitution of the distal SFA or most proximal popliteal artery.  The remainder the popliteal artery was fairly normal.  There was a typical tibial  trifurcation with two-vessel runoff through both the posterior tibial and anterior tibial arteries although the proximal anterior tibial artery had moderate stenosis in the 60 to 70% range   Disposition: Patient was taken to the recovery room in stable condition having tolerated the procedure well.  Complications: None  Scott Scott [name] 02/03/2020 10:44 AM   This note was created with Dragon Medical transcription system. Any errors in dictation are purely unintentional.

## 2020-02-03 NOTE — Discharge Instructions (Signed)
Angiogram, Care After This sheet gives you information about how to care for yourself after your procedure. Your health care provider may also give you more specific instructions. If you have problems or questions, contact your health care provider. What can I expect after the procedure? After the procedure, it is common to have bruising and tenderness at the catheter insertion area. Follow these instructions at home: Insertion site care  Follow instructions from your health care provider about how to take care of your insertion site. Make sure you: ? Wash your hands with soap and water before you change your bandage (dressing). If soap and water are not available, use hand sanitizer. ? Change your dressing as told by your health care provider. ? Leave stitches (sutures), skin glue, or adhesive strips in place. These skin closures may need to stay in place for 2 weeks or longer. If adhesive strip edges start to loosen and curl up, you may trim the loose edges. Do not remove adhesive strips completely unless your health care provider tells you to do that.  Do not take baths, swim, or use a hot tub until your health care provider approves.  You may shower 24-48 hours after the procedure or as told by your health care provider. ? Gently wash the site with plain soap and water. ? Pat the area dry with a clean towel. ? Do not rub the site. This may cause bleeding.  Do not apply powder or lotion to the site. Keep the site clean and dry.  Check your insertion site every day for signs of infection. Check for: ? Redness, swelling, or pain. ? Fluid or blood. ? Warmth. ? Pus or a bad smell. Activity  Rest as told by your health care provider, usually for 1-2 days.  Do not lift anything that is heavier than 10 lbs. (4.5 kg) or as told by your health care provider.  Do not drive for 24 hours if you were given a medicine to help you relax (sedative).  Do not drive or use heavy machinery while  taking prescription pain medicine. General instructions   Return to your normal activities as told by your health care provider, usually in about a week. Ask your health care provider what activities are safe for you.  If the catheter site starts bleeding, lie flat and put pressure on the site. If the bleeding does not stop, get help right away. This is a medical emergency.  Drink enough fluid to keep your urine clear or pale yellow. This helps flush the contrast dye from your body.  Take over-the-counter and prescription medicines only as told by your health care provider.  Keep all follow-up visits as told by your health care provider. This is important. Contact a health care provider if:  You have a fever or chills.  You have redness, swelling, or pain around your insertion site.  You have fluid or blood coming from your insertion site.  The insertion site feels warm to the touch.  You have pus or a bad smell coming from your insertion site.  You have bruising around the insertion site.  You notice blood collecting in the tissue around the catheter site (hematoma). The hematoma may be painful to the touch. Get help right away if:  You have severe pain at the catheter insertion area.  The catheter insertion area swells very fast.  The catheter insertion area is bleeding, and the bleeding does not stop when you hold steady pressure on the area.    The area near or just beyond the catheter insertion site becomes pale, cool, tingly, or numb. These symptoms may represent a serious problem that is an emergency. Do not wait to see if the symptoms will go away. Get medical help right away. Call your local emergency services (911 in the [place] Do not drive yourself to the hospital. Summary  After the procedure, it is common to have bruising and tenderness at the catheter insertion area.  After the procedure, it is important to rest and drink plenty of fluids.  Do not take baths,  swim, or use a hot tub until your health care provider says it is okay to do so. You may shower 24-48 hours after the procedure or as told by your health care provider.  If the catheter site starts bleeding, lie flat and put pressure on the site. If the bleeding does not stop, get help right away. This is a medical emergency. This information is not intended to replace advice given to you by your health care provider. Make sure you discuss any questions you have with your health care provider. Document Revised: 03/21/2017 Document Reviewed: 03/13/2016 Elsevier Patient Education  2020 [employer]  

## 2020-02-03 NOTE — Interval H&P Note (Signed)
History and Physical Interval Note:  02/03/2020 9:05 AM  Scott [name]  has presented today for surgery, with the diagnosis of LT lower extremity angio  ASO w claudication  Covid Oct 13.  The various methods of treatment have been discussed with the patient and family. After consideration of risks, benefits and other options for treatment, the patient has consented to  Procedure(s): LOWER EXTREMITY ANGIOGRAPHY (Left) as a surgical intervention.  [name] history has been reviewed, patient examined, no change in status, stable for surgery.  I have reviewed the patient's chart and labs.  Questions were answered to the patient's satisfaction.     Festus Barren

## 2020-02-08 ENCOUNTER — Telehealth (INDEPENDENT_AMBULATORY_CARE_PROVIDER_SITE_OTHER): Payer: Self-pay

## 2020-02-08 ENCOUNTER — Other Ambulatory Visit (INDEPENDENT_AMBULATORY_CARE_PROVIDER_SITE_OTHER): Payer: Self-pay | Admitting: Nurse Practitioner

## 2020-02-08 MED ORDER — TRAMADOL HCL 50 MG PO TABS
50.0000 mg | ORAL_TABLET | Freq: Four times a day (QID) | ORAL | 0 refills | Status: DC | PRN
Start: 2020-02-08 — End: 2020-02-08

## 2020-02-08 MED ORDER — TRAMADOL HCL 50 MG PO TABS
50.0000 mg | ORAL_TABLET | Freq: Four times a day (QID) | ORAL | 0 refills | Status: DC | PRN
Start: 2020-02-08 — End: 2020-12-31

## 2020-02-08 NOTE — Telephone Encounter (Signed)
Pt left a VM on the nurse's line saying he had a LE angio on the 14 th of October and is having inflammation and pain in his leg and would like to know could he be written a Rx for pain.  The pt also noted the pain occurs when he is walking. Please advise.

## 2020-02-08 NOTE — Telephone Encounter (Signed)
Pt wants to  know can we write him a letter for a leave of absence  for 30 days from is job starting tomorrow  he said he will come by and pick the letter up when it is ready

## 2020-02-08 NOTE — Telephone Encounter (Signed)
I sent in some tramadol however if the pain continues despite it's use, we may need to get him in for ABIs early

## 2020-02-09 ENCOUNTER — Encounter (INDEPENDENT_AMBULATORY_CARE_PROVIDER_SITE_OTHER): Payer: Self-pay | Admitting: Nurse Practitioner

## 2020-02-09 ENCOUNTER — Telehealth (INDEPENDENT_AMBULATORY_CARE_PROVIDER_SITE_OTHER): Payer: Self-pay | Admitting: Nurse Practitioner

## 2020-02-09 NOTE — Telephone Encounter (Signed)
Letter is done.

## 2020-02-10 NOTE — Telephone Encounter (Signed)
The  previous call was to make the pt aware that his leave pf absence  Letter was ready for pick up at the office.

## 2020-02-10 NOTE — Telephone Encounter (Signed)
I called the pt and the phone rang continuously with no VM.

## 2020-02-23 ENCOUNTER — Ambulatory Visit (INDEPENDENT_AMBULATORY_CARE_PROVIDER_SITE_OTHER): Payer: No Typology Code available for payment source

## 2020-02-23 ENCOUNTER — Other Ambulatory Visit (INDEPENDENT_AMBULATORY_CARE_PROVIDER_SITE_OTHER): Payer: Self-pay | Admitting: Vascular Surgery

## 2020-02-23 ENCOUNTER — Encounter (INDEPENDENT_AMBULATORY_CARE_PROVIDER_SITE_OTHER): Payer: Self-pay | Admitting: Nurse Practitioner

## 2020-02-23 ENCOUNTER — Other Ambulatory Visit: Payer: Self-pay

## 2020-02-23 ENCOUNTER — Ambulatory Visit (INDEPENDENT_AMBULATORY_CARE_PROVIDER_SITE_OTHER): Payer: No Typology Code available for payment source | Admitting: Nurse Practitioner

## 2020-02-23 VITALS — BP 121/87 | HR 80 | Ht 71.0 in | Wt 171.0 lb

## 2020-02-23 DIAGNOSIS — I714 Abdominal aortic aneurysm, without rupture, unspecified: Secondary | ICD-10-CM

## 2020-02-23 DIAGNOSIS — R69 Illness, unspecified: Secondary | ICD-10-CM | POA: Diagnosis not present

## 2020-02-23 DIAGNOSIS — I70212 Atherosclerosis of native arteries of extremities with intermittent claudication, left leg: Secondary | ICD-10-CM | POA: Diagnosis not present

## 2020-02-23 DIAGNOSIS — I10 Essential (primary) hypertension: Secondary | ICD-10-CM

## 2020-02-23 DIAGNOSIS — Z95820 Peripheral vascular angioplasty status with implants and grafts: Secondary | ICD-10-CM | POA: Diagnosis not present

## 2020-02-23 DIAGNOSIS — F17210 Nicotine dependence, cigarettes, uncomplicated: Secondary | ICD-10-CM | POA: Diagnosis not present

## 2020-02-23 DIAGNOSIS — Z0289 Encounter for other administrative examinations: Secondary | ICD-10-CM

## 2020-02-23 DIAGNOSIS — I70222 Atherosclerosis of native arteries of extremities with rest pain, left leg: Secondary | ICD-10-CM

## 2020-02-23 HISTORY — DX: Abdominal aortic aneurysm, without rupture, unspecified: I71.40

## 2020-02-23 NOTE — Progress Notes (Signed)
Subjective:    Patient ID: Scott [name], male    DOB: 05/16/1960, 59 y.o.   MRN: [id number] Chief Complaint  Patient presents with  . Follow-up    3wks [facility] post LE angio Aorta iliac  ABI    The patient returns to the office for followup and review status post angiogram with intervention. The patient notes improvement in the lower extremity symptoms. No interval shortening of the patient's claudication distance or rest pain symptoms. Previous wounds have now healed.  No new ulcers or wounds have occurred since the last visit.  There have been no significant changes to the patient's overall health care.  The patient denies amaurosis fugax or recent TIA symptoms. There are no recent neurological changes noted. The patient denies history of DVT, PE or superficial thrombophlebitis. The patient denies recent episodes of angina or shortness of breath.   ABI's Rt=1.10 and Lt=1.14  (no previous) Duplex US of the bilateral tibial arteries shows triphasic waveforms with strong toe waveforms bilaterally   Review of Systems  Cardiovascular: Positive for leg swelling.  All other systems reviewed and are negative.      Objective:   Physical Exam Vitals reviewed.  HENT:     Head: Normocephalic.  Cardiovascular:     Rate and Rhythm: Normal rate.     Pulses: Normal pulses.  Pulmonary:     Effort: Pulmonary effort is normal.  Musculoskeletal:        General: Normal range of motion.     Left lower leg: Edema present.  Skin:    General: Skin is warm and dry.  Neurological:     Mental Status: He is alert and oriented to person, place, and time.  Psychiatric:        Mood and Affect: Mood normal.        Behavior: Behavior normal.        Thought Content: Thought content normal.        Judgment: Judgment normal.     BP 121/87   Pulse 80   Ht 5\' 11"  (1.803 m)   Wt 171 lb (77.6 kg)   BMI 23.85 kg/m   Past Medical History:  Diagnosis Date  . COPD (chronic obstructive pulmonary  disease) (HCC)   . Coronary artery disease   . Dysrhythmia   . Hypertension     Social History   Socioeconomic History  . Marital status: Divorced    Spouse name: Not on file  . Number of children: Not on file  . Years of education: Not on file  . Highest education level: Not on file  Occupational History  . Not on file  Tobacco Use  . Smoking status: Current Every Day Smoker    Packs/day: 1.00    Types: Cigarettes  . Smokeless tobacco: Never Used  Substance and Sexual Activity  . Alcohol use: Yes    Comment: 3-4 beers per day  . Drug use: No  . Sexual activity: Not on file  Other Topics Concern  . Not on file  Social History Narrative  . Not on file   Social Determinants of Health   Financial Resource Strain:   . Difficulty of Paying Living Expenses: Not on file  Food Insecurity:   . Worried About in the Last Year: Not on file  . Ran Out of Food in the Last Year: Not on file  Transportation Needs:   . Lack of Transportation (Medical): Not on file  . Lack  of Transportation (Non-Medical): Not on file  Physical Activity:   . Days of Exercise per Week: Not on file  . Minutes of Exercise per Session: Not on file  Stress:   . Feeling of Stress : Not on file  Social Connections:   . Frequency of Communication with Friends and Family: Not on file  . Frequency of Social Gatherings with Friends and Family: Not on file  . Attends Religious Services: Not on file  . Active Member of Clubs or Organizations: Not on file  . Attends Banker Meetings: Not on file  . Marital Status: Not on file  Intimate Partner Violence:   . Fear of Current or Ex-Partner: Not on file  . Emotionally Abused: Not on file  . Physically Abused: Not on file  . Sexually Abused: Not on file    Past Surgical History:  Procedure Laterality Date  . CHOLECYSTECTOMY    . CHOLECYSTECTOMY  2000  . LEFT HEART CATH AND CORONARY ANGIOGRAPHY Left 06/03/2017   Procedure:  LEFT HEART CATH AND CORONARY ANGIOGRAPHY;  Surgeon: Alwyn Pea, MD;  Location: [facility] INVASIVE [facility];  Service: Cardiovascular;  Laterality: Left;  . LOWER EXTREMITY ANGIOGRAPHY Left 02/03/2020   Procedure: LOWER EXTREMITY ANGIOGRAPHY;  Surgeon: Annice Needy, MD;  Location: [facility] INVASIVE CV [facility];  Service: Cardiovascular;  Laterality: Left;    Family History  Problem Relation Age of Onset  . Diabetes Mother   . Hypertension Mother   . Lupus Mother   . Heart attack Father   . Cancer Brother   . Cancer Brother     Allergies  Allergen Reactions  . Acetaminophen-Codeine Other (See Comments)    Other reaction(s): Other (See Comments) Nausea and "hot flash" Nausea and "hot flash"   . Codeine Nausea Only  . Varenicline Nausea Only    Sour taste in mouth    CBC Latest Ref Rng & Units 06/03/2017 06/03/2017 12/08/2014  WBC 3.8 - 10.6 K/uL 6.8 - 6.8  Hemoglobin 13.0 - 18.0 g/dL 32.3 55.7 32.2  Hematocrit 40 - 52 % 43.9 44.0 48.7  Platelets 150 - 440 K/uL 190 - 198      CMP     Component Value Date/Time   NA 137 06/03/2017 1331   NA 138 06/03/2017 1331   K 3.6 06/03/2017 1331   K 3.6 06/03/2017 1331   CL 100 (L) 06/03/2017 1331   CL 98 (L) 06/03/2017 1331   CO2 26 06/03/2017 1331   GLUCOSE 108 (H) 06/03/2017 1331   GLUCOSE 108 (H) 06/03/2017 1331   BUN 14 06/03/2017 1331   BUN 12 06/03/2017 1331   CREATININE 0.70 06/03/2017 1331   CREATININE 0.70 06/03/2017 1331   CALCIUM 9.0 06/03/2017 1331   GFRNONAA >60 06/03/2017 1331   GFRAA >60 06/03/2017 1331        Assessment & Plan:   1. 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  The patient does not voice lifestyle limiting changes at this point in time.  Noninvasive studies do not suggest clinically significant change.  No invasive studies, angiography or surgery  at this time The patient should continue walking and begin a more formal exercise program.  The patient should continue antiplatelet therapy and aggressive treatment of the lipid abnormalities  No changes in the patient's medications at this time  The patient should continue wearing graduated compression  socks 10-15 mmHg strength to control the mild edema.    2. Abdominal aortic aneurysm (AAA) without rupture (HCC) No surgery or intervention at this time. The patient has an asymptomatic abdominal aortic aneurysm that is less than 4 cm in maximal diameter.  I have discussed the natural history of abdominal aortic aneurysm and the small risk of rupture for aneurysm less than 5 cm in size.  However, as these small aneurysms tend to enlarge over time, continued surveillance with ultrasound or CT scan is mandatory.  I have also discussed optimizing medical management with hypertension and lipid control and the importance of abstinence from tobacco.  The patient is also encouraged to exercise a minimum of 30 minutes 4 times a week.  Should the patient develop new onset abdominal or back pain or signs of peripheral embolization they are instructed to seek medical attention immediately and to alert the physician providing care that they have an aneurysm.  The patient voices their understanding. The patient will return in 6 months with an aortic duplex, since this is his first scan for his aneurysm we will have him return in 6 months to ensure no significant growth.   3. Primary hypertension Continue antihypertensive medications as already ordered, these medications have been reviewed and there are no changes at this time.   4. Cigarette smoker Smoking cessation was discussed, 3-10 minutes spent on this topic specifically    Current Outpatient Medications on File Prior to Visit  Medication Sig Dispense Refill  . acetaminophen (TYLENOL) 325 MG tablet Take 650 mg by mouth every 6 (six) hours as  needed.    Marland Kitchen amLODipine (NORVASC) 5 MG tablet Take 5 mg by mouth daily.  1  . aspirin EC 81 MG tablet Take 1 tablet (81 mg total) by mouth daily. 150 tablet 2  . atorvastatin (LIPITOR) 10 MG tablet Take 1 tablet (10 mg total) by mouth daily. 30 tablet 11  . clopidogrel (PLAVIX) 75 MG tablet Take 1 tablet (75 mg total) by mouth daily. 30 tablet 11  . Fluticasone-Salmeterol (ADVAIR DISKUS) 500-50 MCG/DOSE AEPB INHALE 1 INHALATION INTO THE LUNGS EVERY 12 (TWELVE) HOURS    . isosorbide mononitrate (IMDUR) 30 MG 24 hr tablet Take 30 mg by mouth daily.  5  . lisinopril-hydrochlorothiazide (PRINZIDE,ZESTORETIC) 20-25 MG per tablet Take 1 tablet by mouth daily.    Marland Kitchen lovastatin (MEVACOR) 20 MG tablet TAKE 1 TABLET (20 MG TOTAL) BY MOUTH DAILY WITH DINNER.    . metoprolol succinate (TOPROL-XL) 25 MG 24 hr tablet Take 25 mg by mouth daily.     . naproxen (NAPROSYN) 500 MG tablet Take 1 tablet (500 mg total) by mouth 2 (two) times daily. 30 tablet 0  . predniSONE (STERAPRED UNI-PAK 21 TAB) 10 MG (21) TBPK tablet Dispense one 6 day pack. Take as directed with food. 21 tablet 0  . sodium chloride (OCEAN) 0.65 % SOLN nasal spray Place 1 spray into both nostrils daily as needed for congestion.    . traMADol (ULTRAM) 50 MG tablet Take 1 tablet (50 mg total) by mouth every 6 (six) hours as needed. 20 tablet 0   No current facility-administered medications on file prior to visit.    There are no Patient Instructions on file for this visit. No follow-ups on file.   Georgiana Spinner, NP

## 2020-04-22 DIAGNOSIS — U071 COVID-19: Secondary | ICD-10-CM

## 2020-04-22 HISTORY — DX: COVID-19: U07.1

## 2020-04-25 ENCOUNTER — Ambulatory Visit (INDEPENDENT_AMBULATORY_CARE_PROVIDER_SITE_OTHER): Payer: No Typology Code available for payment source | Admitting: Nurse Practitioner

## 2020-04-25 ENCOUNTER — Encounter (INDEPENDENT_AMBULATORY_CARE_PROVIDER_SITE_OTHER): Payer: No Typology Code available for payment source

## 2020-05-29 ENCOUNTER — Other Ambulatory Visit (INDEPENDENT_AMBULATORY_CARE_PROVIDER_SITE_OTHER): Payer: Self-pay | Admitting: Nurse Practitioner

## 2020-05-29 DIAGNOSIS — I739 Peripheral vascular disease, unspecified: Secondary | ICD-10-CM

## 2020-05-30 ENCOUNTER — Ambulatory Visit (INDEPENDENT_AMBULATORY_CARE_PROVIDER_SITE_OTHER): Payer: No Typology Code available for payment source | Admitting: Nurse Practitioner

## 2020-05-30 ENCOUNTER — Encounter (INDEPENDENT_AMBULATORY_CARE_PROVIDER_SITE_OTHER): Payer: Self-pay | Admitting: Nurse Practitioner

## 2020-05-30 ENCOUNTER — Other Ambulatory Visit: Payer: Self-pay

## 2020-05-30 ENCOUNTER — Ambulatory Visit (INDEPENDENT_AMBULATORY_CARE_PROVIDER_SITE_OTHER): Payer: No Typology Code available for payment source

## 2020-05-30 VITALS — BP 162/95 | HR 88 | Resp 16 | Wt 173.8 lb

## 2020-05-30 DIAGNOSIS — R69 Illness, unspecified: Secondary | ICD-10-CM | POA: Diagnosis not present

## 2020-05-30 DIAGNOSIS — I70212 Atherosclerosis of native arteries of extremities with intermittent claudication, left leg: Secondary | ICD-10-CM | POA: Diagnosis not present

## 2020-05-30 DIAGNOSIS — I10 Essential (primary) hypertension: Secondary | ICD-10-CM

## 2020-05-30 DIAGNOSIS — I739 Peripheral vascular disease, unspecified: Secondary | ICD-10-CM

## 2020-05-30 DIAGNOSIS — E7800 Pure hypercholesterolemia, unspecified: Secondary | ICD-10-CM

## 2020-05-30 DIAGNOSIS — F17210 Nicotine dependence, cigarettes, uncomplicated: Secondary | ICD-10-CM | POA: Diagnosis not present

## 2020-05-30 DIAGNOSIS — R2990 Unspecified symptoms and signs involving the nervous system: Secondary | ICD-10-CM | POA: Diagnosis not present

## 2020-06-04 ENCOUNTER — Encounter (INDEPENDENT_AMBULATORY_CARE_PROVIDER_SITE_OTHER): Payer: Self-pay | Admitting: Nurse Practitioner

## 2020-06-04 NOTE — Progress Notes (Signed)
Subjective:    Patient ID: Scott [name], male    DOB: 04/08/1961, 60 y.o.   MRN: [id number] Chief Complaint  Patient presents with  . Follow-up    3 month ABI    Patient returns today for evaluation of his lower extremity peripheral arterial disease.  He underwent intervention on 02/03/2020 on his left lower extremity.  Since that time the patient notes that he has not had any claudication-like symptoms.  He notes that his legs feel good and he has not developed any wounds or ulcerations.  The biggest complaint that the patient has currently is of feeling as if his left hand is cold.  The patient notes that his hand feels cold all the time and denies any color changes.  When I palpate it feels warm.  The patient also notes that there is some numbness and tingling associated swell.  He does endorse having a previous history of issues with his neck.  A more concerning symptom the patient notes is that several months ago he had some visual changes and slurred speech.  He notes that this lasted for several hours and resolved without intervention.  The patient did not seek any medical attention at this time due to his job.  He has not had a subsequent event since that time.  He does note that there are some continued visual issues within his left eye.  He also notes that he feels as if there is pressure behind his eye..  The patient also notes that the cold numb sensation also started when the slurred speech episode occurred.  Today noninvasive studies show an ABI 0.96 on the right and 0.98 on the left.  This is slightly decreased from his previous ABI of 1.10 on the right and 1.14 on the left.  The patient has triphasic tibial artery waveforms bilaterally with good toe waveforms bilaterally.   Review of Systems  Eyes: Positive for visual disturbance.  Musculoskeletal: Positive for neck pain.  Neurological: Positive for numbness.  All other systems reviewed and are negative.      Objective:    Physical Exam Vitals reviewed.  HENT:     Head: Normocephalic.  Cardiovascular:     Rate and Rhythm: Normal rate.     Pulses:          Radial pulses are 1+ on the right side and 1+ on the left side.       Dorsalis pedis pulses are 1+ on the right side and 1+ on the left side.       Posterior tibial pulses are 1+ on the right side and 1+ on the left side.     Heart sounds: Normal heart sounds.  Pulmonary:     Effort: Pulmonary effort is normal.  Skin:    General: Skin is warm and dry.  Neurological:     Mental Status: He is alert and oriented to person, place, and time.  Psychiatric:        Mood and Affect: Mood normal.        Behavior: Behavior normal.        Thought Content: Thought content normal.        Judgment: Judgment normal.     BP (!) 162/95 (BP Location: Right Arm)   Pulse 88   Resp 16   Wt 173 lb 12.8 oz (78.8 kg)   BMI 24.24 kg/m   Past Medical History:  Diagnosis Date  . COPD (chronic obstructive pulmonary disease) (HCC)   .  Coronary artery disease   . Dysrhythmia   . Hypertension     Social History   Socioeconomic History  . Marital status: Divorced    Spouse name: Not on file  . Number of children: Not on file  . Years of education: Not on file  . Highest education level: Not on file  Occupational History  . Not on file  Tobacco Use  . Smoking status: Current Every Day Smoker    Packs/day: 1.00    Types: Cigarettes  . Smokeless tobacco: Never Used  Substance and Sexual Activity  . Alcohol use: Yes    Comment: 3-4 beers per day  . Drug use: No  . Sexual activity: Not on file  Other Topics Concern  . Not on file  Social History Narrative  . Not on file   Social Determinants of Health   Financial Resource Strain: Not on file  Food Insecurity: Not on file  Transportation Needs: Not on file  Physical Activity: Not on file  Stress: Not on file  Social Connections: Not on file  Intimate Partner Violence: Not on file    Past Surgical  History:  Procedure Laterality Date  . CHOLECYSTECTOMY    . CHOLECYSTECTOMY  2000  . LEFT HEART CATH AND CORONARY ANGIOGRAPHY Left 06/03/2017   Procedure: LEFT HEART CATH AND CORONARY ANGIOGRAPHY;  Surgeon: Alwyn Pea, MD;  Location: [facility]IVE [facility];  Service: Cardiovascular;  Laterality: Left;  . LOWER EXTREMITY ANGIOGRAPHY Left 02/03/2020   Procedure: LOWER EXTREMITY ANGIOGRAPHY;  Surgeon: Annice Needy, MD;  Location: [facility];  Service: Cardiovascular;  Laterality: Left;    Family History  Problem Relation Age of Onset  . Diabetes Mother   . Hypertension Mother   . Lupus Mother   . Heart attack Father   . Cancer Brother   . Cancer Brother     Allergies  Allergen Reactions  . Acetaminophen-Codeine Other (See Comments)    Other reaction(s): Other (See Comments) Nausea and "hot flash" Nausea and "hot flash"   . Codeine Nausea Only  . Varenicline Nausea Only    Sour taste in mouth    CBC Latest Ref Rng & Units 06/03/2017 06/03/2017 12/08/2014  WBC 3.8 - 10.6 K/uL 6.8 - 6.8  Hemoglobin 13.0 - 18.0 g/dL 45.6 25.6 38.9  Hematocrit 40.0 - 52.0 % 43.9 44.0 48.7  Platelets 150 - 440 K/uL 190 - 198      CMP     Component Value Date/Time   NA 137 06/03/2017 1331   NA 138 06/03/2017 1331   K 3.6 06/03/2017 1331   K 3.6 06/03/2017 1331   CL 100 (L) 06/03/2017 1331   CL 98 (L) 06/03/2017 1331   CO2 26 06/03/2017 1331   GLUCOSE 108 (H) 06/03/2017 1331   GLUCOSE 108 (H) 06/03/2017 1331   BUN 14 06/03/2017 1331   BUN 12 06/03/2017 1331   CREATININE 0.70 06/03/2017 1331   CREATININE 0.70 06/03/2017 1331   CALCIUM 9.0 06/03/2017 1331   GFRNONAA >60 06/03/2017 1331   GFRAA >60 06/03/2017 1331     VAS [place] ABI WITH/WO TBI  Result Date: 05/30/2020 LOWER EXTREMITY DOPPLER STUDY Indications: Peripheral artery disease, and S/P Angioplasty with Stent.  Vascular Interventions: 02/03/2020: Aortogram and Selective Left Lower Extremity                          Angiogram. PTA of the Left Anterior Tibial Artery with  3mm balloon. PTA of the Left SFA and most Proximal                         Popliteal Artery. Viabahn stent placement x2 to the Left                         SFA. Comparison Study: 02/23/2020 Performing Technologist: Debbe BalesSolomon Mcclary RVS  Examination Guidelines: A complete evaluation includes at minimum, Doppler waveform signals and systolic blood pressure reading at the level of bilateral brachial, anterior tibial, and posterior tibial arteries, when vessel segments are accessible. Bilateral testing is considered an integral part of a complete examination. Photoelectric Plethysmograph (PPG) waveforms and toe systolic pressure readings are included as required and additional duplex testing as needed. Limited examinations for reoccurring indications may be performed as noted.  ABI Findings: Right Brachial: 165 mmHg ATA: 160 mmHg; 0.95; triphasic PTA: 162 mmHg; 0.96; triphasic Great toe: 150 mmHg; 0.89; normal +---------+------------------+-----+---------+-------+ Left     Lt Pressure (mmHg)IndexWaveform Comment +---------+------------------+-----+---------+-------+ Brachial 169                                     +---------+------------------+-----+---------+-------+ ATA      166               0.98 triphasic        +---------+------------------+-----+---------+-------+ PTA      165               0.98 triphasic        +---------+------------------+-----+---------+-------+ Great Toe156               0.92 Normal           +---------+------------------+-----+---------+-------+ +-------+-----------+-----------+------------+------------+ ABI/TBIToday's ABIToday's TBIPrevious ABIPrevious TBI +-------+-----------+-----------+------------+------------+ Right  .96        .89        1.10        .90          +-------+-----------+-----------+------------+------------+ Left   .98        .92        1.14         .99          +-------+-----------+-----------+------------+------------+ Bilateral ABIs appear decreased compared to prior study on 02/23/2020. Left TBIs appear decreased compared to prior study on 02/23/2020. Rt TBIs appear essentiallyunchanged compared to prior study on 02/23/2020.  Summary: Right: Resting right ankle-brachial index is within normal range. No evidence of significant right lower extremity arterial disease. The right toe-brachial index is normal. Left: Resting left ankle-brachial index is within normal range. No evidence of significant left lower extremity arterial disease. The left toe-brachial index is normal.  *See table(s) above for measurements and observations.  Electronically signed by Festus BarrenJason Dew MD on 05/30/2020 at 1:24:39 PM.    Final        Assessment & Plan:   1. 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  The patient does not voice lifestyle limiting changes at this point in time.  Noninvasive studies do not suggest clinically significant change.  No invasive studies, angiography or surgery at this time The patient should continue walking and begin a more formal exercise program.  The patient should continue antiplatelet therapy and aggressive treatment of the lipid abnormalities  No changes in the patient's medications at this time  The patient should continue wearing graduated compression socks 10-15 mmHg strength to control the mild edema.   We will have the patient return to the office in 6 months for follow-up of his lower extremity studies as well as his abdominal aortic aneurysm. 2. Stroke-like symptoms The episode where the patient had slurred speech now has continued vision issues is highly concerning for possible strokelike symptoms.  The patient has known peripheral arterial disease so we will obtain a  carotid artery duplex to assess for possible carotid cause.  We will also refer the patient to ophthalmology in regards to his vision changes and to neurology for evaluation of a possible stroke.  [name] left upper extremity symptoms may also be a result from post CVA neurological changes or possible subclavian stenosis.  This can also be evaluated at his upcoming follow-ups.  Patient is advised that if the symptoms happen again he should go to the emergency room immediately.  We discussed that optimal time to intervention is 3 to 4-1/2 hours post symptoms.  Patient is also advised to follow-up with his PCP as well. - VAS US CAROTID; Future - Ambulatory referral to Ophthalmology - Ambulatory referral to Neurology  3. Cigarette smoker Smoking cessation was discussed, 3-10 minutes spent on this topic specifically   4. Hypercholesterolemia Continue statin as ordered and reviewed, no changes at this time   5. Hypertension, unspecified type Continue antihypertensive medications as already ordered, these medications have been reviewed and there are no changes at this time.    Current Outpatient Medications on File Prior to Visit  Medication Sig Dispense Refill  . acetaminophen (TYLENOL) 325 MG tablet Take 650 mg by mouth every 6 (six) hours as needed.    Marland Kitchen amLODipine (NORVASC) 5 MG tablet Take 5 mg by mouth daily.  1  . aspirin EC 81 MG tablet Take 1 tablet (81 mg total) by mouth daily. 150 tablet 2  . atorvastatin (LIPITOR) 10 MG tablet Take 1 tablet (10 mg total) by mouth daily. 30 tablet 11  . clopidogrel (PLAVIX) 75 MG tablet Take 1 tablet (75 mg total) by mouth daily. 30 tablet 11  . Fluticasone-Salmeterol (ADVAIR) 500-50 MCG/DOSE AEPB INHALE 1 INHALATION INTO THE LUNGS EVERY 12 (TWELVE) HOURS    . isosorbide mononitrate (IMDUR) 30 MG 24 hr tablet Take 30 mg by mouth daily.  5  . lisinopril-hydrochlorothiazide (PRINZIDE,ZESTORETIC) 20-25 MG per tablet Take 1 tablet by mouth daily.     Marland Kitchen lovastatin (MEVACOR) 20 MG tablet TAKE 1 TABLET (20 MG TOTAL) BY MOUTH DAILY WITH DINNER.    . metoprolol succinate (TOPROL-XL) 25 MG 24 hr tablet Take 25 mg by mouth daily.     . naproxen (NAPROSYN) 500 MG tablet Take 1 tablet (500 mg total) by mouth 2 (two) times daily. 30 tablet 0  . predniSONE (STERAPRED UNI-PAK 21 TAB) 10 MG (21) TBPK tablet Dispense one 6 day pack. Take as directed with food. 21 tablet 0  . sodium chloride (OCEAN) 0.65 % SOLN nasal spray Place 1 spray into both nostrils daily as needed for congestion.    . traMADol (ULTRAM) 50 MG tablet Take 1 tablet (50 mg total) by mouth every 6 (six) hours as needed. 20 tablet 0   No current facility-administered medications on file prior to visit.    There are no Patient Instructions on file for this visit. No follow-ups on file.   Georgiana Spinner, NP

## 2020-06-14 ENCOUNTER — Encounter (INDEPENDENT_AMBULATORY_CARE_PROVIDER_SITE_OTHER): Payer: No Typology Code available for payment source

## 2020-06-14 ENCOUNTER — Ambulatory Visit (INDEPENDENT_AMBULATORY_CARE_PROVIDER_SITE_OTHER): Payer: No Typology Code available for payment source | Admitting: Nurse Practitioner

## 2020-06-20 ENCOUNTER — Encounter (INDEPENDENT_AMBULATORY_CARE_PROVIDER_SITE_OTHER): Payer: Self-pay | Admitting: Nurse Practitioner

## 2020-06-20 ENCOUNTER — Ambulatory Visit (INDEPENDENT_AMBULATORY_CARE_PROVIDER_SITE_OTHER): Payer: No Typology Code available for payment source

## 2020-06-20 ENCOUNTER — Ambulatory Visit (INDEPENDENT_AMBULATORY_CARE_PROVIDER_SITE_OTHER): Payer: No Typology Code available for payment source | Admitting: Nurse Practitioner

## 2020-06-20 ENCOUNTER — Other Ambulatory Visit: Payer: Self-pay

## 2020-06-20 VITALS — BP 152/94 | HR 76 | Resp 16 | Wt 172.4 lb

## 2020-06-20 DIAGNOSIS — R2990 Unspecified symptoms and signs involving the nervous system: Secondary | ICD-10-CM

## 2020-06-20 DIAGNOSIS — I70212 Atherosclerosis of native arteries of extremities with intermittent claudication, left leg: Secondary | ICD-10-CM

## 2020-06-20 DIAGNOSIS — I6523 Occlusion and stenosis of bilateral carotid arteries: Secondary | ICD-10-CM

## 2020-06-20 DIAGNOSIS — J3089 Other allergic rhinitis: Secondary | ICD-10-CM | POA: Diagnosis not present

## 2020-06-20 DIAGNOSIS — R200 Anesthesia of skin: Secondary | ICD-10-CM | POA: Diagnosis not present

## 2020-06-20 DIAGNOSIS — I779 Disorder of arteries and arterioles, unspecified: Secondary | ICD-10-CM

## 2020-06-20 HISTORY — DX: Disorder of arteries and arterioles, unspecified: I77.9

## 2020-06-20 NOTE — Progress Notes (Signed)
Subjective:    Patient ID: Scott [name], male    DOB: 1960-06-28, 60 y.o.   MRN: [id number] Chief Complaint  Patient presents with  . Follow-up    Ultrasound follow up    The patient presents today for follow-up studies related to carotid artery stenosis.  The patient several months ago experienced TIA-like symptoms.  Patient noticed that he had slurred speech with blurred vision.  He did not seek medical attention at the time.  The symptoms resolved without any treatment however he notes that occasionally he feels as if he has blurred vision.  Patient has had referrals to neurology and ophthalmology for this.  The patient also complains of numbness in his left upper extremity.  He also complains of neck pain and decreased range of motion in his neck.  He notes that sometimes it feels stuck with movement.  He denies any recent strokelike symptoms.  Patient also has a history of PAD for which multiple interventions have been necessary.  [name] most recent studies were stable several weeks ago.  He also complains of having pain along the side of his neck pain he is also having significant drainage and congestion from his allergies.  He notes that he has been taking several over-the-counter medications and the symptoms persist.  Today the patient has a 1 to 39% right ICA stenosis with a 40 to 59% left ICA stenosis.  The bilateral vertebral arteries have antegrade flow with normal flow hemodynamics seen in the bilateral subclavian arteries.   Review of Systems  Neurological: Positive for numbness.  All other systems reviewed and are negative.      Objective:   Physical Exam Vitals reviewed.  HENT:     Head: Normocephalic.  Neck:     Vascular: No carotid bruit.  Cardiovascular:     Rate and Rhythm: Normal rate.     Pulses:          Radial pulses are 2+ on the right side and 2+ on the left side.       Dorsalis pedis pulses are 1+ on the right side and 1+ on the left side.        Posterior tibial pulses are 1+ on the right side and 1+ on the left side.  Pulmonary:     Effort: Pulmonary effort is normal.  Musculoskeletal:     Cervical back: Pain with movement present. Decreased range of motion.  Neurological:     Mental Status: He is alert and oriented to person, place, and time.  Psychiatric:        Mood and Affect: Mood normal.        Behavior: Behavior normal.        Thought Content: Thought content normal.        Judgment: Judgment normal.     BP (!) 152/94 (BP Location: Right Arm)   Pulse 76   Resp 16   Wt 172 lb 6.4 oz (78.2 kg)   BMI 24.04 kg/m   Past Medical History:  Diagnosis Date  . COPD (chronic obstructive pulmonary disease) (HCC)   . Coronary artery disease   . Dysrhythmia   . Hypertension     Social History   Socioeconomic History  . Marital status: Divorced    Spouse name: Not on file  . Number of children: Not on file  . Years of education: Not on file  . Highest education level: Not on file  Occupational History  . Not on file  Tobacco Use  . Smoking status: Current Every Day Smoker    Packs/day: 1.00    Types: Cigarettes  . Smokeless tobacco: Never Used  Substance and Sexual Activity  . Alcohol use: Yes    Comment: 3-4 beers per day  . Drug use: No  . Sexual activity: Not on file  Other Topics Concern  . Not on file  Social History Narrative  . Not on file   Social Determinants of Health   Financial Resource Strain: Not on file  Food Insecurity: Not on file  Transportation Needs: Not on file  Physical Activity: Not on file  Stress: Not on file  Social Connections: Not on file  Intimate Partner Violence: Not on file    Past Surgical History:  Procedure Laterality Date  . CHOLECYSTECTOMY    . CHOLECYSTECTOMY  2000  . LEFT HEART CATH AND CORONARY ANGIOGRAPHY Left 06/03/2017   Procedure: LEFT HEART CATH AND CORONARY ANGIOGRAPHY;  Surgeon: Alwyn Pea, MD;  Location: [facility] INVASIVE CV [facility];  Service:  Cardiovascular;  Laterality: Left;  . LOWER EXTREMITY ANGIOGRAPHY Left 02/03/2020   Procedure: LOWER EXTREMITY ANGIOGRAPHY;  Surgeon: Annice Needy, MD;  Location: [facility] INVASIVE CV LAB;  Service: Cardiovascular;  Laterality: Left;    Family History  Problem Relation Age of Onset  . Diabetes Mother   . Hypertension Mother   . Lupus Mother   . Heart attack Father   . Cancer Brother   . Cancer Brother     Allergies  Allergen Reactions  . Acetaminophen-Codeine Other (See Comments)    Other reaction(s): Other (See Comments) Nausea and "hot flash" Nausea and "hot flash"   . Codeine Nausea Only  . Varenicline Nausea Only    Sour taste in mouth    CBC Latest Ref Rng & Units 06/03/2017 06/03/2017 12/08/2014  WBC 3.8 - 10.6 K/uL 6.8 - 6.8  Hemoglobin 13.0 - 18.0 g/dL 62.3 76.2 83.1  Hematocrit 40.0 - 52.0 % 43.9 44.0 48.7  Platelets 150 - 440 K/uL 190 - 198      CMP     Component Value Date/Time   NA 137 06/03/2017 1331   NA 138 06/03/2017 1331   K 3.6 06/03/2017 1331   K 3.6 06/03/2017 1331   CL 100 (L) 06/03/2017 1331   CL 98 (L) 06/03/2017 1331   CO2 26 06/03/2017 1331   GLUCOSE 108 (H) 06/03/2017 1331   GLUCOSE 108 (H) 06/03/2017 1331   BUN 14 06/03/2017 1331   BUN 12 06/03/2017 1331   CREATININE 0.70 06/03/2017 1331   CREATININE 0.70 06/03/2017 1331   CALCIUM 9.0 06/03/2017 1331   GFRNONAA >60 06/03/2017 1331   GFRAA >60 06/03/2017 1331     VAS [place] ABI WITH/WO TBI  Result Date: 05/30/2020 LOWER EXTREMITY DOPPLER STUDY Indications: Peripheral artery disease, and S/P Angioplasty with Stent.  Vascular Interventions: 02/03/2020: Aortogram and Selective Left Lower Extremity                         Angiogram. PTA of the Left Anterior Tibial Artery with                         76mm balloon. PTA of the Left SFA and most Proximal                         Popliteal Artery. Viabahn stent placement x2 to the Left  SFA. Comparison Study: 02/23/2020 Performing  Technologist: Debbe BalesSolomon Mcclary RVS  Examination Guidelines: A complete evaluation includes at minimum, Doppler waveform signals and systolic blood pressure reading at the level of bilateral brachial, anterior tibial, and posterior tibial arteries, when vessel segments are accessible. Bilateral testing is considered an integral part of a complete examination. Photoelectric Plethysmograph (PPG) waveforms and toe systolic pressure readings are included as required and additional duplex testing as needed. Limited examinations for reoccurring indications may be performed as noted.  ABI Findings: Right Brachial: 165 mmHg ATA: 160 mmHg; 0.95; triphasic PTA: 162 mmHg; 0.96; triphasic Great toe: 150 mmHg; 0.89; normal +---------+------------------+-----+---------+-------+ Left     Lt Pressure (mmHg)IndexWaveform Comment +---------+------------------+-----+---------+-------+ Brachial 169                                     +---------+------------------+-----+---------+-------+ ATA      166               0.98 triphasic        +---------+------------------+-----+---------+-------+ PTA      165               0.98 triphasic        +---------+------------------+-----+---------+-------+ Great Toe156               0.92 Normal           +---------+------------------+-----+---------+-------+ +-------+-----------+-----------+------------+------------+ ABI/TBIToday's ABIToday's TBIPrevious ABIPrevious TBI +-------+-----------+-----------+------------+------------+ Right  .96        .89        1.10        .90          +-------+-----------+-----------+------------+------------+ Left   .98        .92        1.14        .99          +-------+-----------+-----------+------------+------------+ Bilateral ABIs appear decreased compared to prior study on 02/23/2020. Left TBIs appear decreased compared to prior study on 02/23/2020. Rt TBIs appear essentiallyunchanged compared to prior study on  02/23/2020.  Summary: Right: Resting right ankle-brachial index is within normal range. No evidence of significant right lower extremity arterial disease. The right toe-brachial index is normal. Left: Resting left ankle-brachial index is within normal range. No evidence of significant left lower extremity arterial disease. The left toe-brachial index is normal.  *See table(s) above for measurements and observations.  Electronically signed by Festus BarrenJason Dew MD on 05/30/2020 at 1:24:39 PM.    Final        Assessment & Plan:   1. Bilateral carotid artery stenosis Recommend:  Given the patient's asymptomatic subcritical stenosis no further invasive testing or surgery at this time.  Duplex ultrasound shows 1 to 39% stenosis of the right ICA with 40 to 59% stenosis of the left ICA  Continue antiplatelet therapy as prescribed Continue management of CAD, HTN and Hyperlipidemia Healthy heart diet,  encouraged exercise at least 4 times per week We will follow up in 6 months to repeat studies to ensure there is no rapid progression.  If not, will have the patient return to normal follow-up schedule which is typically every 12 months.  If the patient should have any further TIA-like symptoms, CT angiogram of the neck may be considered. 2. Arm numbness left Based on noninvasive studies today the patient does not have evidence of significant subclavian steal.  The patient also has strong distal pulses suggesting that this numbness is  not related to arterial issues.  Given that patient has difficulty with turning his head as well as moments where he feels as if his neck gets "stuck", it is likely related to his spine and possible nerve compression.  We will refer the patient to neurosurgery for further evaluation. - Ambulatory referral to Neurosurgery  3. Atherosclerosis of native artery of left lower extremity with intermittent claudication [facility]) Patient has been doing well with ambulation.  Patient had recent  ABIs and they were stable.  We will continue with plan to check in 6 months along with abdominal aortic aneurysm.  4. Seasonal allergic rhinitis due to other allergic trigger Patient has been taking several over-the-counter medications that have not given a drastic deal of symptom relief.  Due to patient being a smoker he may need treatment with antibiotics for possible sinus infection.  Patient is advised to contact PCP for evaluation.   Current Outpatient Medications on File Prior to Visit  Medication Sig Dispense Refill  . acetaminophen (TYLENOL) 325 MG tablet Take 650 mg by mouth every 6 (six) hours as needed.    Marland Kitchen amLODipine (NORVASC) 5 MG tablet Take 5 mg by mouth daily.  1  . aspirin EC 81 MG tablet Take 1 tablet (81 mg total) by mouth daily. 150 tablet 2  . atorvastatin (LIPITOR) 10 MG tablet Take 1 tablet (10 mg total) by mouth daily. 30 tablet 11  . clopidogrel (PLAVIX) 75 MG tablet Take 1 tablet (75 mg total) by mouth daily. 30 tablet 11  . Fluticasone-Salmeterol (ADVAIR) 500-50 MCG/DOSE AEPB INHALE 1 INHALATION INTO THE LUNGS EVERY 12 (TWELVE) HOURS    . isosorbide mononitrate (IMDUR) 30 MG 24 hr tablet Take 30 mg by mouth daily.  5  . lisinopril-hydrochlorothiazide (PRINZIDE,ZESTORETIC) 20-25 MG per tablet Take 1 tablet by mouth daily.    Marland Kitchen lovastatin (MEVACOR) 20 MG tablet TAKE 1 TABLET (20 MG TOTAL) BY MOUTH DAILY WITH DINNER.    . metoprolol succinate (TOPROL-XL) 25 MG 24 hr tablet Take 25 mg by mouth daily.     . naproxen (NAPROSYN) 500 MG tablet Take 1 tablet (500 mg total) by mouth 2 (two) times daily. 30 tablet 0  . predniSONE (STERAPRED UNI-PAK 21 TAB) 10 MG (21) TBPK tablet Dispense one 6 day pack. Take as directed with food. 21 tablet 0  . sodium chloride (OCEAN) 0.65 % SOLN nasal spray Place 1 spray into both nostrils daily as needed for congestion.    . traMADol (ULTRAM) 50 MG tablet Take 1 tablet (50 mg total) by mouth every 6 (six) hours as needed. (Patient not taking:  Reported on 06/20/2020) 20 tablet 0   No current facility-administered medications on file prior to visit.    There are no Patient Instructions on file for this visit. No follow-ups on file.   Georgiana Spinner, NP

## 2020-08-29 ENCOUNTER — Ambulatory Visit (INDEPENDENT_AMBULATORY_CARE_PROVIDER_SITE_OTHER): Payer: No Typology Code available for payment source | Admitting: Nurse Practitioner

## 2020-08-29 ENCOUNTER — Other Ambulatory Visit (INDEPENDENT_AMBULATORY_CARE_PROVIDER_SITE_OTHER): Payer: No Typology Code available for payment source

## 2020-09-14 DIAGNOSIS — E7800 Pure hypercholesterolemia, unspecified: Secondary | ICD-10-CM | POA: Diagnosis not present

## 2020-09-14 DIAGNOSIS — R079 Chest pain, unspecified: Secondary | ICD-10-CM | POA: Diagnosis not present

## 2020-09-14 DIAGNOSIS — I10 Essential (primary) hypertension: Secondary | ICD-10-CM | POA: Diagnosis not present

## 2020-09-14 DIAGNOSIS — Z1329 Encounter for screening for other suspected endocrine disorder: Secondary | ICD-10-CM | POA: Diagnosis not present

## 2020-09-14 DIAGNOSIS — I779 Disorder of arteries and arterioles, unspecified: Secondary | ICD-10-CM | POA: Diagnosis not present

## 2020-09-14 DIAGNOSIS — Z131 Encounter for screening for diabetes mellitus: Secondary | ICD-10-CM | POA: Diagnosis not present

## 2020-09-14 DIAGNOSIS — I471 Supraventricular tachycardia: Secondary | ICD-10-CM | POA: Diagnosis not present

## 2020-09-14 DIAGNOSIS — I25118 Atherosclerotic heart disease of native coronary artery with other forms of angina pectoris: Secondary | ICD-10-CM | POA: Diagnosis not present

## 2020-09-14 DIAGNOSIS — I70213 Atherosclerosis of native arteries of extremities with intermittent claudication, bilateral legs: Secondary | ICD-10-CM | POA: Diagnosis not present

## 2020-09-25 DIAGNOSIS — I2510 Atherosclerotic heart disease of native coronary artery without angina pectoris: Secondary | ICD-10-CM | POA: Diagnosis not present

## 2020-09-25 DIAGNOSIS — I10 Essential (primary) hypertension: Secondary | ICD-10-CM | POA: Diagnosis not present

## 2020-09-25 DIAGNOSIS — E7800 Pure hypercholesterolemia, unspecified: Secondary | ICD-10-CM | POA: Diagnosis not present

## 2020-09-25 DIAGNOSIS — I208 Other forms of angina pectoris: Secondary | ICD-10-CM | POA: Diagnosis not present

## 2020-11-23 ENCOUNTER — Other Ambulatory Visit (INDEPENDENT_AMBULATORY_CARE_PROVIDER_SITE_OTHER): Payer: Self-pay | Admitting: Nurse Practitioner

## 2020-11-23 DIAGNOSIS — I739 Peripheral vascular disease, unspecified: Secondary | ICD-10-CM

## 2020-11-23 DIAGNOSIS — I714 Abdominal aortic aneurysm, without rupture, unspecified: Secondary | ICD-10-CM

## 2020-11-23 DIAGNOSIS — I6523 Occlusion and stenosis of bilateral carotid arteries: Secondary | ICD-10-CM

## 2020-11-28 ENCOUNTER — Ambulatory Visit (INDEPENDENT_AMBULATORY_CARE_PROVIDER_SITE_OTHER): Payer: 59

## 2020-11-28 ENCOUNTER — Ambulatory Visit (INDEPENDENT_AMBULATORY_CARE_PROVIDER_SITE_OTHER): Payer: 59 | Admitting: Vascular Surgery

## 2020-11-28 ENCOUNTER — Other Ambulatory Visit: Payer: Self-pay

## 2020-11-28 ENCOUNTER — Encounter (INDEPENDENT_AMBULATORY_CARE_PROVIDER_SITE_OTHER): Payer: Self-pay | Admitting: Vascular Surgery

## 2020-11-28 VITALS — BP 161/69 | HR 67 | Resp 16 | Wt 170.6 lb

## 2020-11-28 DIAGNOSIS — I714 Abdominal aortic aneurysm, without rupture, unspecified: Secondary | ICD-10-CM

## 2020-11-28 DIAGNOSIS — I70212 Atherosclerosis of native arteries of extremities with intermittent claudication, left leg: Secondary | ICD-10-CM | POA: Diagnosis not present

## 2020-11-28 DIAGNOSIS — E7800 Pure hypercholesterolemia, unspecified: Secondary | ICD-10-CM | POA: Diagnosis not present

## 2020-11-28 DIAGNOSIS — I739 Peripheral vascular disease, unspecified: Secondary | ICD-10-CM | POA: Diagnosis not present

## 2020-11-28 DIAGNOSIS — I6523 Occlusion and stenosis of bilateral carotid arteries: Secondary | ICD-10-CM

## 2020-11-28 DIAGNOSIS — I10 Essential (primary) hypertension: Secondary | ICD-10-CM | POA: Diagnosis not present

## 2020-11-28 DIAGNOSIS — I6529 Occlusion and stenosis of unspecified carotid artery: Secondary | ICD-10-CM | POA: Insufficient documentation

## 2020-11-28 DIAGNOSIS — I779 Disorder of arteries and arterioles, unspecified: Secondary | ICD-10-CM | POA: Diagnosis not present

## 2020-11-28 NOTE — Assessment & Plan Note (Signed)
blood pressure control important in reducing the progression of atherosclerotic disease. On appropriate oral medications.  

## 2020-11-28 NOTE — Assessment & Plan Note (Signed)
Duplex today shows mild carotid disease in the 1 to 39% range bilaterally.  Mild disease on appropriate medical therapy.  This can be checked in 2 years.

## 2020-11-28 NOTE — Assessment & Plan Note (Signed)
lipid control important in reducing the progression of atherosclerotic disease. Continue statin therapy  

## 2020-11-28 NOTE — Assessment & Plan Note (Signed)
Duplex today shows a stable 3.72 cm infrarenal abdominal aortic aneurysm unchanged from his last study.  No worrisome aneurysm related symptoms.  We will continue surveillance follow-up in 1 year with duplex.  No role for repair at this size.

## 2020-11-28 NOTE — Progress Notes (Signed)
MRN : [id number]  Scott [name] is a 60 y.o. (1961/04/08) male who presents with chief complaint of  Chief Complaint  Patient presents with   Follow-up    Ultrasound follow up  .  History of Present Illness: Patient returns today in follow up of multiple vascular issues.  He is doing well today.  He does still have significant claudication symptoms predominantly in the right lower extremity at this time.  He is almost a year status post left lower extremity revascularization with improvement in his claudication symptoms following that procedure.  No open wounds.  No rest pain.  No signs of infection of the lower extremities.  ABIs today are 0.75 on the right which is a drop from 0.9 at his last visit.  Left ABI is 1.15 with triphasic waveforms and normal digital pressures. He is also followed for an aneurysm that was found at the time of his angiogram last year.  No aneurysm related symptoms. Specifically, the patient denies new back or abdominal pain, or signs of peripheral embolization. Duplex today shows a stable 3.72 cm infrarenal abdominal aortic aneurysm unchanged from his last study.  We also evaluated him today for carotid disease.  He is doing well and has no focal neurologic symptoms. Specifically, the patient denies amaurosis fugax, speech or swallowing difficulties, or arm or leg weakness or numbness.  Duplex today shows mild carotid disease in the 1 to 39% range bilaterally.  Current Outpatient Medications  Medication Sig Dispense Refill   acetaminophen (TYLENOL) 325 MG tablet Take 650 mg by mouth every 6 (six) hours as needed.     amLODipine (NORVASC) 5 MG tablet Take 5 mg by mouth daily.  1   aspirin EC 81 MG tablet Take 1 tablet (81 mg total) by mouth daily. 150 tablet 2   atorvastatin (LIPITOR) 10 MG tablet Take 1 tablet (10 mg total) by mouth daily. 30 tablet 11   BREO ELLIPTA 200-25 MCG/INH AEPB Inhale 1 puff into the lungs daily.     clopidogrel (PLAVIX) 75 MG tablet  Take 1 tablet (75 mg total) by mouth daily. 30 tablet 11   isosorbide mononitrate (IMDUR) 30 MG 24 hr tablet Take 30 mg by mouth daily.  5   lisinopril-hydrochlorothiazide (PRINZIDE,ZESTORETIC) 20-25 MG per tablet Take 1 tablet by mouth daily.     lovastatin (MEVACOR) 20 MG tablet TAKE 1 TABLET (20 MG TOTAL) BY MOUTH DAILY WITH DINNER.     metoprolol succinate (TOPROL-XL) 25 MG 24 hr tablet Take 25 mg by mouth daily.      naproxen (NAPROSYN) 500 MG tablet Take 1 tablet (500 mg total) by mouth 2 (two) times daily. 30 tablet 0   Fluticasone-Salmeterol (ADVAIR) 500-50 MCG/DOSE AEPB INHALE 1 INHALATION INTO THE LUNGS EVERY 12 (TWELVE) HOURS (Patient not taking: Reported on 11/28/2020)     predniSONE (STERAPRED UNI-PAK 21 TAB) 10 MG (21) TBPK tablet Dispense one 6 day pack. Take as directed with food. (Patient not taking: Reported on 11/28/2020) 21 tablet 0   sodium chloride (OCEAN) 0.65 % SOLN nasal spray Place 1 spray into both nostrils daily as needed for congestion. (Patient not taking: Reported on 11/28/2020)     traMADol (ULTRAM) 50 MG tablet Take 1 tablet (50 mg total) by mouth every 6 (six) hours as needed. (Patient not taking: No sig reported) 20 tablet 0   No current facility-administered medications for this visit.    Past Medical History:  Diagnosis Date   COPD (chronic obstructive  pulmonary disease) (HCC)    Coronary artery disease    Dysrhythmia    Hypertension     Past Surgical History:  Procedure Laterality Date   CHOLECYSTECTOMY     CHOLECYSTECTOMY  2000   LEFT HEART CATH AND CORONARY ANGIOGRAPHY Left 06/03/2017   Procedure: LEFT HEART CATH AND CORONARY ANGIOGRAPHY;  Surgeon: Alwyn Pea, MD;  Location: [facility] INVASIVE CV LAB;  Service: Cardiovascular;  Laterality: Left;   LOWER EXTREMITY ANGIOGRAPHY Left 02/03/2020   Procedure: LOWER EXTREMITY ANGIOGRAPHY;  Surgeon: Annice Needy, MD;  Location: [facility] INVASIVE CV [facility];  Service: Cardiovascular;  Laterality: Left;     Social  History   Tobacco Use   Smoking status: Every Day    Packs/day: 1.00    Types: Cigarettes   Smokeless tobacco: Never  Substance Use Topics   Alcohol use: Yes    Comment: 3-4 beers per day   Drug use: No      Family History  Problem Relation Age of Onset   Diabetes Mother    Hypertension Mother    Lupus Mother    Heart attack Father    Cancer Brother    Cancer Brother      Allergies  Allergen Reactions   Acetaminophen-Codeine Other (See Comments)    Other reaction(s): Other (See Comments) Nausea and "hot flash" Nausea and "hot flash"    Codeine Nausea Only   Varenicline Nausea Only    Sour taste in mouth    REVIEW OF SYSTEMS (Negative unless checked)   Constitutional: [] Weight loss  [] Fever  [] Chills Cardiac: [] Chest pain   [] Chest pressure   [] Palpitations   [] Shortness of breath when laying flat   [] Shortness of breath at rest   [] Shortness of breath with exertion. Vascular:  [x] Pain in legs with walking   [x] Pain in legs at rest   [] Pain in legs when laying flat   [x] Claudication   [] Pain in feet when walking  [] Pain in feet at rest  [] Pain in feet when laying flat   [] History of DVT   [] Phlebitis   [] Swelling in legs   [] Varicose veins   [] Non-healing ulcers Pulmonary:   [] Uses home oxygen   [] Productive cough   [] Hemoptysis   [] Wheeze  [] COPD   [] Asthma Neurologic:  [] Dizziness  [] Blackouts   [] Seizures   [] History of stroke   [] History of TIA  [] Aphasia   [] Temporary blindness   [] Dysphagia   [] Weakness or numbness in arms   [] Weakness or numbness in legs Musculoskeletal:  [] Arthritis   [] Joint swelling   [] Joint pain   [] Low back pain Hematologic:  [] Easy bruising  [] Easy bleeding   [] Hypercoagulable state   [] Anemic  [] Hepatitis Gastrointestinal:  [] Blood in stool   [] Vomiting blood  [] Gastroesophageal reflux/heartburn   [] Abdominal pain Genitourinary:  [] Chronic kidney disease   [] Difficult urination  [] Frequent urination  [] Burning with urination    [] Hematuria Skin:  [] Rashes   [] Ulcers   [] Wounds Psychological:  [] History of anxiety   []  History of major depression.   Physical Examination  BP (!) 161/69 (BP Location: Right Arm)   Pulse 67   Resp 16   Wt 170 lb 9.6 oz (77.4 kg)   BMI 23.79 kg/m  Gen:  WD/WN, NAD Head: [place]/AT, No temporalis wasting. Ear/Nose/Throat: Hearing grossly intact, nares w/o erythema or drainage Eyes: Conjunctiva clear. Sclera non-icteric Neck: Supple.  Trachea midline Pulmonary:  Good air movement, no use of accessory muscles.  Cardiac: RRR, no JVD Vascular:  Vessel  Right Left  Radial Palpable Palpable                          PT 1+ Palpable 2+ Palpable  DP 1+ Palpable 1+ Palpable   Gastrointestinal: soft, non-tender/non-distended. No guarding/reflex.  Musculoskeletal: M/S 5/5 throughout.  No deformity or atrophy. No edema. Neurologic: Sensation grossly intact in extremities.  Symmetrical.  Speech is fluent.  Psychiatric: Judgment intact, Mood & affect appropriate for pt's clinical situation. Dermatologic: No rashes or ulcers noted.  No cellulitis or open wounds.      Labs No results found for this or any previous visit (from the past 2160 hour(s)).  Radiology No results found.  Assessment/Plan  Hypercholesterolemia lipid control important in reducing the progression of atherosclerotic disease. Continue statin therapy   Atherosclerosis of native arteries of extremity with intermittent claudication (HCC) ABIs today are 0.75 on the right which is a drop from 0.9 at his last visit.  Left ABI is 1.15 with triphasic waveforms and normal digital pressures.  Does still have claudication symptoms on the right and has had a drop in his ABI, but his symptoms are reasonably stable.  We discussed if these were lifestyle limiting, intervention would certainly be appropriate.  He does not think they are lifestyle limiting at this time so we will continue to monitor this and I will plan to see him  back in 6 months.  Continue current medical regimen.  AAA (abdominal aortic aneurysm) without rupture (HCC) Duplex today shows a stable 3.72 cm infrarenal abdominal aortic aneurysm unchanged from his last study.  No worrisome aneurysm related symptoms.  We will continue surveillance follow-up in 1 year with duplex.  No role for repair at this size.  Hypertension blood pressure control important in reducing the progression of atherosclerotic disease. On appropriate oral medications.   Carotid stenosis Duplex today shows mild carotid disease in the 1 to 39% range bilaterally.  Mild disease on appropriate medical therapy.  This can be checked in 2 years.    Festus Barren, MD  11/28/2020 10:08 AM    This note was created with Dragon medical transcription system.  Any errors from dictation are purely unintentional

## 2020-11-28 NOTE — Assessment & Plan Note (Signed)
ABIs today are 0.75 on the right which is a drop from 0.9 at his last visit.  Left ABI is 1.15 with triphasic waveforms and normal digital pressures.  Does still have claudication symptoms on the right and has had a drop in his ABI, but his symptoms are reasonably stable.  We discussed if these were lifestyle limiting, intervention would certainly be appropriate.  He does not think they are lifestyle limiting at this time so we will continue to monitor this and I will plan to see him back in 6 months.  Continue current medical regimen.

## 2020-12-11 DIAGNOSIS — I208 Other forms of angina pectoris: Secondary | ICD-10-CM | POA: Diagnosis not present

## 2020-12-11 DIAGNOSIS — I2510 Atherosclerotic heart disease of native coronary artery without angina pectoris: Secondary | ICD-10-CM | POA: Diagnosis not present

## 2020-12-18 ENCOUNTER — Emergency Department: Payer: 59

## 2020-12-18 ENCOUNTER — Emergency Department
Admission: EM | Admit: 2020-12-18 | Discharge: 2020-12-18 | Disposition: A | Discharge status: Home / Self Care | Payer: 59 | Attending: Emergency Medicine | Admitting: Emergency Medicine

## 2020-12-18 ENCOUNTER — Telehealth (INDEPENDENT_AMBULATORY_CARE_PROVIDER_SITE_OTHER): Payer: Self-pay | Admitting: Vascular Surgery

## 2020-12-18 ENCOUNTER — Other Ambulatory Visit: Payer: Self-pay

## 2020-12-18 DIAGNOSIS — R200 Anesthesia of skin: Secondary | ICD-10-CM | POA: Diagnosis not present

## 2020-12-18 DIAGNOSIS — M79605 Pain in left leg: Secondary | ICD-10-CM

## 2020-12-18 DIAGNOSIS — I10 Essential (primary) hypertension: Secondary | ICD-10-CM | POA: Diagnosis not present

## 2020-12-18 DIAGNOSIS — Z7951 Long term (current) use of inhaled steroids: Secondary | ICD-10-CM | POA: Insufficient documentation

## 2020-12-18 DIAGNOSIS — Z79899 Other long term (current) drug therapy: Secondary | ICD-10-CM | POA: Diagnosis not present

## 2020-12-18 DIAGNOSIS — Z7902 Long term (current) use of antithrombotics/antiplatelets: Secondary | ICD-10-CM | POA: Insufficient documentation

## 2020-12-18 DIAGNOSIS — I208 Other forms of angina pectoris: Secondary | ICD-10-CM | POA: Diagnosis not present

## 2020-12-18 DIAGNOSIS — I2510 Atherosclerotic heart disease of native coronary artery without angina pectoris: Secondary | ICD-10-CM | POA: Insufficient documentation

## 2020-12-18 DIAGNOSIS — M7989 Other specified soft tissue disorders: Secondary | ICD-10-CM

## 2020-12-18 DIAGNOSIS — Z7982 Long term (current) use of aspirin: Secondary | ICD-10-CM | POA: Insufficient documentation

## 2020-12-18 DIAGNOSIS — K5730 Diverticulosis of large intestine without perforation or abscess without bleeding: Secondary | ICD-10-CM | POA: Diagnosis not present

## 2020-12-18 DIAGNOSIS — M79672 Pain in left foot: Secondary | ICD-10-CM | POA: Insufficient documentation

## 2020-12-18 DIAGNOSIS — E7800 Pure hypercholesterolemia, unspecified: Secondary | ICD-10-CM | POA: Diagnosis not present

## 2020-12-18 DIAGNOSIS — F17210 Nicotine dependence, cigarettes, uncomplicated: Secondary | ICD-10-CM | POA: Insufficient documentation

## 2020-12-18 DIAGNOSIS — M25572 Pain in left ankle and joints of left foot: Secondary | ICD-10-CM | POA: Diagnosis not present

## 2020-12-18 DIAGNOSIS — J449 Chronic obstructive pulmonary disease, unspecified: Secondary | ICD-10-CM | POA: Diagnosis not present

## 2020-12-18 DIAGNOSIS — I714 Abdominal aortic aneurysm, without rupture: Secondary | ICD-10-CM | POA: Diagnosis not present

## 2020-12-18 DIAGNOSIS — M79662 Pain in left lower leg: Secondary | ICD-10-CM | POA: Insufficient documentation

## 2020-12-18 DIAGNOSIS — I701 Atherosclerosis of renal artery: Secondary | ICD-10-CM | POA: Diagnosis not present

## 2020-12-18 DIAGNOSIS — W19XXXA Unspecified fall, initial encounter: Secondary | ICD-10-CM

## 2020-12-18 LAB — BASIC METABOLIC PANEL
Anion gap: 11 (ref 5–15)
BUN: 9 mg/dL (ref 6–20)
CO2: 29 mmol/L (ref 22–32)
Calcium: 8.8 mg/dL — ABNORMAL LOW (ref 8.9–10.3)
Chloride: 90 mmol/L — ABNORMAL LOW (ref 98–111)
Creatinine, Ser: 0.67 mg/dL (ref 0.61–1.24)
GFR, Estimated: >60 mL/min (ref >60)
Glucose, Bld: 127 mg/dL — ABNORMAL HIGH (ref 70–99)
Potassium: 3.4 mmol/L — ABNORMAL LOW (ref 3.5–5.1)
Sodium: 130 mmol/L — ABNORMAL LOW (ref 135–145)

## 2020-12-18 MED ORDER — IOHEXOL 350 MG/ML SOLN
100.0000 mL | Freq: Once | INTRAVENOUS | Status: AC | PRN
  Administered 2020-12-18: 100 mL via INTRAVENOUS
  Filled 2020-12-18: qty 100

## 2020-12-18 NOTE — Telephone Encounter (Signed)
Pt sent friend in to [facility] this morning and stated that pt was in car. Couldn't walk well, swollen leg, swollen ankle and purple in color. Due to Dr. Gilda Crease having to leave clinic for emergency surgery, we felt best for pt to go to ED. Pt's friend stated that they would go right now.

## 2020-12-18 NOTE — ED Triage Notes (Addendum)
Pt comes with c/o left leg pain. Pt states he injured it few days ago while at work. Pt states he has stents placed in that leg. Pt states his leg is black and brown and foot. Pt is on blood thinners.

## 2020-12-18 NOTE — ED Notes (Signed)
LABS DRAWN IN TRIAGE (Lav, light green and blue top)

## 2020-12-18 NOTE — ED Provider Notes (Signed)
[facility] Emergency Department Provider Note   ____________________________________________   Event Date/Time   First MD Initiated Contact with Patient 12/18/20 1050     (approximate)  I have reviewed the triage vital signs and the nursing notes.   HISTORY  Chief Complaint Leg Pain    HPI WACO FOERSTER is a 60 y.o. male who presents for left lower extremity pain  LOCATION: Left calf, ankle, and foot DURATION: 2 days TIMING: Worsening since onset SEVERITY: 6/10 QUALITY: Cramping CONTEXT: Patient states that he had a box fall on medial aspect of his left lower leg proximally 3 days prior to arrival and has been having worsening pain since that time MODIFYING FACTORS: Walking on the left lower extremity worsens this pain and is partially relieved at rest and with raising the leg ASSOCIATED SYMPTOMS: Discoloration of the toes   Per medical record review, patient has history of atherosclerosis of bilateral lower extremity arteries with stents in place          Past Medical History:  Diagnosis Date   COPD (chronic obstructive pulmonary disease) (HCC)    Coronary artery disease    Dysrhythmia    Hypertension     Patient Active Problem List   Diagnosis Date Noted   AAA (abdominal aortic aneurysm) without rupture (HCC) 11/28/2020   Carotid stenosis 11/28/2020   Atherosclerosis of native arteries of extremity with intermittent claudication (HCC) 02/01/2020   PAOD (peripheral arterial occlusive disease) (HCC) 01/21/2020   Vitamin B12 deficiency 09/02/2018   Vitamin D deficiency 09/02/2018   Chest congestion 05/27/2018   Rib pain on right side 05/27/2018   High risk medication use 03/03/2018   Arthritis involving multiple sites 03/28/2017   Daily consumption of alcohol 03/28/2017   Dupuytren's contracture of both hands 03/28/2017   Family history of rheumatoid arthritis 03/28/2017   Intermittent chest pain 03/28/2017   Status post  ablation of ventricular arrhythmia 03/28/2017   SVT (supraventricular tachycardia) (HCC) 12/20/2015   Cigarette smoker 06/19/2015   Hypercholesterolemia 06/19/2015   Allergic rhinitis 12/03/2013   Anxiety 12/03/2013   CAD (coronary artery disease) 12/03/2013   COPD (chronic obstructive pulmonary disease) (HCC) 12/03/2013   Hypertension 12/03/2013    Past Surgical History:  Procedure Laterality Date   CHOLECYSTECTOMY     CHOLECYSTECTOMY  2000   LEFT HEART CATH AND CORONARY ANGIOGRAPHY Left 06/03/2017   Procedure: LEFT HEART CATH AND CORONARY ANGIOGRAPHY;  Surgeon: Alwyn Pea, MD;  Location: [facility] INVASIVE CV LAB;  Service: Cardiovascular;  Laterality: Left;   LOWER EXTREMITY ANGIOGRAPHY Left 02/03/2020   Procedure: LOWER EXTREMITY ANGIOGRAPHY;  Surgeon: Annice Needy, MD;  Location: [facility] INVASIVE CV LAB;  Service: Cardiovascular;  Laterality: Left;    Prior to Admission medications   Medication Sig Start Date End Date Taking? Authorizing Provider  acetaminophen (TYLENOL) 325 MG tablet Take 650 mg by mouth every 6 (six) hours as needed.    [provider]  amLODipine (NORVASC) 5 MG tablet Take 5 mg by mouth daily. 03/31/17   [provider]  aspirin EC 81 MG tablet Take 1 tablet (81 mg total) by mouth daily. 02/03/20   Annice Needy, MD  atorvastatin (LIPITOR) 10 MG tablet Take 1 tablet (10 mg total) by mouth daily. 02/03/20 02/02/21  Annice Needy, MD  BREO ELLIPTA 200-25 MCG/INH AEPB Inhale 1 puff into the lungs daily. 11/11/20   [provider]  clopidogrel (PLAVIX) 75 MG tablet Take 1 tablet (75 mg total)  by mouth daily. 02/03/20   Annice Needyew, Jason S, MD  Fluticasone-Salmeterol (ADVAIR) 500-50 MCG/DOSE AEPB INHALE 1 INHALATION INTO THE LUNGS EVERY 12 (TWELVE) HOURS Patient not taking: Reported on 11/28/2020 09/30/19   [provider]  isosorbide mononitrate (IMDUR) 30 MG 24 hr tablet Take 30 mg by mouth daily. 04/21/17   [provider]   lisinopril-hydrochlorothiazide (PRINZIDE,ZESTORETIC) 20-25 MG per tablet Take 1 tablet by mouth daily.    [provider]  lovastatin (MEVACOR) 20 MG tablet TAKE 1 TABLET (20 MG TOTAL) BY MOUTH DAILY WITH DINNER. 06/14/16   [provider]  metoprolol succinate (TOPROL-XL) 25 MG 24 hr tablet Take 25 mg by mouth daily.     [provider]  naproxen (NAPROSYN) 500 MG tablet Take 1 tablet (500 mg total) by mouth 2 (two) times daily. 04/17/18   Lurline IdolMurrill, Samantha, FNP  predniSONE (STERAPRED UNI-PAK 21 TAB) 10 MG (21) TBPK tablet Dispense one 6 day pack. Take as directed with food. Patient not taking: Reported on 11/28/2020 10/09/16   Domenick GongMortenson, Ashley, MD  sodium chloride (OCEAN) 0.65 % SOLN nasal spray Place 1 spray into both nostrils daily as needed for congestion. Patient not taking: Reported on 11/28/2020    [provider]  traMADol (ULTRAM) 50 MG tablet Take 1 tablet (50 mg total) by mouth every 6 (six) hours as needed. Patient not taking: No sig reported 02/08/20   Georgiana SpinnerBrown, Fallon E, NP    Allergies Acetaminophen-codeine, Codeine, and Varenicline  Family History  Problem Relation Age of Onset   Diabetes Mother    Hypertension Mother    Lupus Mother    Heart attack Father    Cancer Brother    Cancer Brother     Social History Social History   Tobacco Use   Smoking status: Every Day    Packs/day: 1.00    Types: Cigarettes   Smokeless tobacco: Never  Substance Use Topics   Alcohol use: Yes    Comment: 3-4 beers per day   Drug use: No    Review of Systems Constitutional: No fever/chills Eyes: No visual changes. ENT: No sore throat. Cardiovascular: Denies chest pain. Respiratory: Denies shortness of breath. Gastrointestinal: No abdominal pain.  No nausea, no vomiting.  No diarrhea. Genitourinary: Negative for dysuria. Musculoskeletal: Positive for acute left lower leg pain Skin: Negative for rash. Neurological: Negative for headaches,  weakness/numbness/paresthesias in any extremity Psychiatric: Negative for suicidal ideation/homicidal ideation   ____________________________________________   PHYSICAL EXAM:  VITAL SIGNS: ED Triage Vitals  Enc Vitals Group     BP 12/18/20 0958 136/83     Pulse Rate 12/18/20 0958 (!) 117     Resp 12/18/20 0958 20     Temp 12/18/20 0958 98.6 F (37 C)     Temp Source 12/18/20 0958 Oral     SpO2 12/18/20 0958 96 %     Weight 12/18/20 0958 170 lb (77.1 kg)     Height 12/18/20 0958 5\' 10"  (1.778 m)     Head Circumference --      Peak Flow --      Pain Score 12/18/20 0957 6     Pain Loc --      Pain Edu? --      Excl. in GC? --    Constitutional: Alert and oriented. Well appearing and in no acute distress. Eyes: Conjunctivae are normal. PERRL. Head: Atraumatic. Nose: No congestion/rhinnorhea. Mouth/Throat: Mucous membranes are moist. Neck: No stridor Cardiovascular: Grossly normal heart sounds.  No palpable  pulses in the left DP or PT Respiratory: Normal respiratory effort.  No retractions. Gastrointestinal: Soft and nontender. No distention. Musculoskeletal: No obvious deformities Neurologic:  Normal speech and language. No gross focal neurologic deficits are appreciated. Skin: Mild erythema to the medial aspect of the left lower leg to the medial aspect of the ankle.  Blue discoloration over the great, second, and third toe on the left foot.  Skin is warm and dry Psychiatric: Mood and affect are normal. Speech and behavior are normal.  ____________________________________________   LABS (all labs ordered are listed, but only abnormal results are displayed)  Labs Reviewed  BASIC METABOLIC PANEL - Abnormal; Notable for the following components:      Result Value   Sodium 130 (*)    Potassium 3.4 (*)    Chloride 90 (*)    Glucose, Bld 127 (*)    Calcium 8.8 (*)    All other components within normal limits    ____________________________________________  RADIOLOGY  ED MD interpretation: Doppler ultrasound of the left lower extremity shows no evidence of DVT  CT angiography of the aortobifemoral vasculature significant for occluded left common femoral and stented superficial femoral arteries. Faint opacification of flow seen within the popliteal artery with diminutive patent channel. There does appear to be faint opacification of the anterior tibial and peroneal arteries in the left lower leg. 3. Short-segment complete to near complete occlusion at the origin of the right superficial femoral artery. The right SFA and popliteal artery are patent but extensively calcified  Official radiology report(s): CT Angio Aortobifemoral W and/or Wo Contrast  Result Date: 12/18/2020 CLINICAL DATA:  Left leg pain. Injured few days ago while at work. History of arterial stents. Discoloration of the foot. EXAM: CT ANGIOGRAPHY OF ABDOMINAL AORTA WITH ILIOFEMORAL RUNOFF TECHNIQUE: Multidetector CT imaging of the abdomen, pelvis and lower extremities was performed using the standard protocol during bolus administration of intravenous contrast. Multiplanar CT image reconstructions and MIPs were obtained to evaluate the vascular anatomy. CONTRAST:  OMNIPAQUE IOHEXOL 350 MG/ML SOLN COMPARISON:  None. FINDINGS: VASCULAR Aorta: Diffuse atherosclerotic calcifications of the abdominal aorta. 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 Celiac: No significant stenosis of the celiac artery. SMA: Approximately 60% stenosis of the mid superior mesenteric artery due to focal calcified plaque best seen on image 104 of series 8. Superior mesenteric artery is otherwise patent. The right hepatic artery is replaced originating from the superior mesenteric artery. Renals: Mild to moderate stenosis at the origin of the left  main renal artery. No significant stenosis of the right main renal artery. IMA: Inferior mesenteric artery is occluded at the origin but demonstrate distal opacification through retrograde collateral flow. RIGHT Lower Extremity Inflow: Right common iliac artery is diffusely calcified without high-grade stenosis. Right internal iliac artery is densely calcified but remains patent without significant stenosis. Right external iliac artery is diffusely narrowed due to calcified plaque. There is focal high-grade stenosis of the distal segment measuring approximately 60%. Outflow: Right common femoral artery is diffusely narrowed in its proximal segment there is near complete occlusion of the distal segment. Severe stenosis at the origin of the Profunda femoris artery due to calcified plaque best seen on the sagittal series. There is complete to near complete occlusion at the origin of the right superficial femoral artery. There is reconstitution of flow just beyond the origin with than a  diffusely narrowed and diseased SFA. Popliteal artery is diffusely narrowed due to calcified plaque. Highest degree of stenosis measures approximately 50%. Runoff: Tibioperoneal trunk appears patent. Evaluation is limited due to dense atheromatous calcification. There is 2 vessel runoff to the right ankle through the posterior tibial and peroneal arteries. Anterior tibial artery opacifies to the mid lower leg. LEFT Lower Extremity Inflow: Common iliac artery is diffusely calcified but patent without significant stenosis. Internal iliac artery is diffusely calcified but remains patent. External iliac artery is diffusely calcified but patent without high-grade stenosis. Outflow: Left common femoral artery is occluded. There is occlusion of the origin of the profunda femoris artery but distal branches demonstrate opacification consistent with retrograde collateral flow. The stented left superficial femoral artery is occluded. There is faint  reconstitution of flow within the popliteal artery with diminutive flow channel. Runoff: There appears to be faint opacification of the lower leg arteries with anterior tibial and peroneal arteries continue to the level the ankle. Veins: No obvious venous abnormality within the limitations of this arterial phase study. Review of the MIP images confirms the above findings. NON-VASCULAR Lower chest: No acute abnormality. Respiratory motion limits evaluation. Hepatobiliary: Diffuse hepatic steatosis. No focal hepatic lesion. Cholecystectomy clips are present in the gallbladder fossa. Pancreas: Unremarkable. No pancreatic ductal dilatation or surrounding inflammatory changes. Spleen: Normal in size without focal abnormality. Adrenals/Urinary Tract: Adrenal glands are unremarkable. Kidneys are normal, without renal calculi, focal lesion, or hydronephrosis. Bladder is unremarkable. Stomach/Bowel: Diffuse colonic diverticulosis, greatest involving the sigmoid. Appendix is normal. No significant abnormality the stomach or small bowel. Lymphatic: No enlarged abdominal or pelvic lymph nodes. Reproductive: Prostate is unremarkable. Other: Small fat containing umbilical hernia. Mild asymmetric edema of the left lower leg and foot. Musculoskeletal: Deformity of the right posterior ninth rib consistent with healed fracture. Degenerative changes seen throughout the lower lumbar spine. Mild bilateral hip osteoarthrosis. Mild compression deformity of the T11 vertebral body of uncertain chronicity. IMPRESSION: VASCULAR 1. 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 Recommend follow-up every 2 years. This recommendation follows ACR consensus guidelines: White Paper of the ACR Incidental Findings Committee II on Vascular Findings. J Am Coll Radiol 2013; 10:789-794. 2. 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 Faint opacification of flow  seen within the popliteal artery with diminutive patent channel. There does appear to be faint opacification of the anterior tibial and peroneal arteries in the left lower leg. 3. Short-segment complete to near complete occlusion at the origin of the right superficial femoral artery. The right SFA and popliteal artery are patent but extensively calcified. NON-VASCULAR 1. Hepatic steatosis. 2. Colonic diverticulosis. Electronically Signed   By: Acquanetta Belling M.D.   On: 12/18/2020 16:56   US Venous Img Lower Unilateral Left (DVT)  Result Date: 12/18/2020 CLINICAL DATA:  Pain, swelling, discoloration, and numbness of left lower extremity EXAM: LEFT LOWER EXTREMITY VENOUS DOPPLER ULTRASOUND TECHNIQUE: Gray-scale sonography with compression, as well as color and duplex ultrasound, were performed to evaluate the deep venous system(s) from the level of the common femoral vein through the popliteal and proximal calf veins. COMPARISON:  None. FINDINGS: VENOUS Normal compressibility of the common femoral, superficial femoral, and popliteal veins, as well as the visualized calf veins. Visualized portions of profunda femoral vein and great saphenous vein unremarkable. No filling defects to suggest DVT on grayscale or color Doppler imaging. Doppler waveforms show normal direction of venous flow, normal respiratory  plasticity and response to augmentation. Limited views of the contralateral common femoral vein are unremarkable. OTHER Diffuse atherosclerotic calcifications of the visualized arterial segments. 2.0 x 2.0 x 0.5 cm fluid collection in the left popliteal fossa is likely Baker cyst. Limitations: none IMPRESSION: No left lower extremity DVT. Electronically Signed   By: Acquanetta Belling M.D.   On: 12/18/2020 12:16    ____________________________________________   PROCEDURES  Procedure(s) performed (including Critical Care):  .1-3 Lead EKG Interpretation  Date/Time: 12/18/2020 5:47 PM Performed by: Merwyn Katos, MD Authorized by: Merwyn Katos, MD     Interpretation: normal     ECG rate:  88   ECG rate assessment: normal     Rhythm: sinus rhythm     Ectopy: none     Conduction: normal     ____________________________________________   INITIAL IMPRESSION / ASSESSMENT AND PLAN / ED COURSE  As part of my medical decision making, I reviewed the following data within the electronic medical record, if available:  Nursing notes reviewed and incorporated, Labs reviewed, EKG interpreted, Old chart reviewed, Radiograph reviewed and Notes from prior ED visits reviewed and incorporated        Patient is a 60 year old male with the above-stated past medical history presents for left leg pain after trauma partially 1 week prior to arrival  DDx: Occluded stented vasculature, cellulitis, tibial fracture, fibular fracture, sprain ankle  Imaging: Negative for DVT Occluded vasculature as described above  Consults: Vascular-spoke to Dr. Gilda Crease in vascular surgery who does not see any need for urgent revascularization or admission at this time and recommends follow-up with Dr. Wyn Quaker in his clinic on Friday.  Patient ambulatory with crutches due to continued pain in this foot  Dispo: Discharge home with vascular surgical follow-up within the next 3-5 days      ____________________________________________   FINAL CLINICAL IMPRESSION(S) / ED DIAGNOSES  Final diagnoses:  Left leg pain     ED Discharge Orders     None        Note:  This document was prepared using Dragon voice recognition software and may include unintentional dictation errors.    Merwyn Katos, MD 12/18/20 [phone]

## 2020-12-18 NOTE — ED Notes (Signed)
Labs drawn and sent to lab. No orders placed at this time.

## 2020-12-26 ENCOUNTER — Ambulatory Visit (INDEPENDENT_AMBULATORY_CARE_PROVIDER_SITE_OTHER): Payer: 59 | Admitting: Vascular Surgery

## 2020-12-26 ENCOUNTER — Other Ambulatory Visit: Payer: Self-pay

## 2020-12-26 ENCOUNTER — Encounter (INDEPENDENT_AMBULATORY_CARE_PROVIDER_SITE_OTHER): Payer: Self-pay | Admitting: Nurse Practitioner

## 2020-12-26 VITALS — BP 134/82 | HR 89 | Ht 70.0 in | Wt 170.0 lb

## 2020-12-26 DIAGNOSIS — I714 Abdominal aortic aneurysm, without rupture, unspecified: Secondary | ICD-10-CM

## 2020-12-26 DIAGNOSIS — I10 Essential (primary) hypertension: Secondary | ICD-10-CM | POA: Diagnosis not present

## 2020-12-26 DIAGNOSIS — E7800 Pure hypercholesterolemia, unspecified: Secondary | ICD-10-CM

## 2020-12-26 DIAGNOSIS — I70229 Atherosclerosis of native arteries of extremities with rest pain, unspecified extremity: Secondary | ICD-10-CM | POA: Insufficient documentation

## 2020-12-26 DIAGNOSIS — I70222 Atherosclerosis of native arteries of extremities with rest pain, left leg: Secondary | ICD-10-CM

## 2020-12-26 DIAGNOSIS — I70212 Atherosclerosis of native arteries of extremities with intermittent claudication, left leg: Secondary | ICD-10-CM | POA: Diagnosis not present

## 2020-12-26 DIAGNOSIS — I6523 Occlusion and stenosis of bilateral carotid arteries: Secondary | ICD-10-CM

## 2020-12-26 NOTE — Progress Notes (Signed)
  MRN : [id number]  Scott [name] is a 60 y.o. (01/06/1961) male who presents with chief complaint of  Chief Complaint  Patient presents with   Follow-up    Per Dr. Schnier [facility] ED  .  History of Present Illness: Patient returns today in follow up of his PAD with new left leg symptoms since his last office visit 1 month ago.  He was seen in the ER and found to have just significant worsening particularly in his left leg which is now the symptomatic leg with symptoms concerning for rest pain.  He has been dangling his foot.  His foot is swollen.  He can only walk a few steps before having to stop due to pain. I have independently reviewed his CT scan.  He has what appears to be a new left common femoral artery occlusion as well as occlusion of his left SFA interventions.  His right common femoral artery is highly stenotic as is the origin of the SFA.  Current Outpatient Medications  Medication Sig Dispense Refill   acetaminophen (TYLENOL) 325 MG tablet Take 650 mg by mouth every 6 (six) hours as needed.     amLODipine (NORVASC) 10 MG tablet Take 10 mg by mouth daily.     amLODipine (NORVASC) 5 MG tablet Take 5 mg by mouth daily.  1   aspirin 325 MG EC tablet Take 325 mg by mouth 2 (two) times daily.     aspirin EC 81 MG tablet Take 1 tablet (81 mg total) by mouth daily. 150 tablet 2   atorvastatin (LIPITOR) 10 MG tablet Take 1 tablet (10 mg total) by mouth daily. 30 tablet 11   atorvastatin (LIPITOR) 40 MG tablet Take 40 mg by mouth daily.     BREO ELLIPTA 200-25 MCG/INH AEPB Inhale 1 puff into the lungs daily.     clopidogrel (PLAVIX) 75 MG tablet Take 1 tablet (75 mg total) by mouth daily. 30 tablet 11   Fluticasone-Salmeterol (ADVAIR) 500-50 MCG/DOSE AEPB      HYDROcodone-acetaminophen (NORCO/VICODIN) 5-325 MG tablet Take 1 tablet by mouth every 4 (four) hours as needed.     isosorbide mononitrate (IMDUR) 30 MG 24 hr tablet Take 30 mg by mouth daily.  5   lisinopril-hydrochlorothiazide  (PRINZIDE,ZESTORETIC) 20-25 MG per tablet Take 1 tablet by mouth daily.     lovastatin (MEVACOR) 20 MG tablet TAKE 1 TABLET (20 MG TOTAL) BY MOUTH DAILY WITH DINNER.     metoprolol succinate (TOPROL-XL) 25 MG 24 hr tablet Take 25 mg by mouth daily.      naproxen (NAPROSYN) 500 MG tablet Take 1 tablet (500 mg total) by mouth 2 (two) times daily. 30 tablet 0   predniSONE (STERAPRED UNI-PAK 21 TAB) 10 MG (21) TBPK tablet Dispense one 6 day pack. Take as directed with food. 21 tablet 0   sodium chloride (OCEAN) 0.65 % SOLN nasal spray Place 1 spray into both nostrils daily as needed for congestion.     traMADol (ULTRAM) 50 MG tablet Take 1 tablet (50 mg total) by mouth every 6 (six) hours as needed. 20 tablet 0   No current facility-administered medications for this visit.    Past Medical History:  Diagnosis Date   COPD (chronic obstructive pulmonary disease) (HCC)    Coronary artery disease    Dysrhythmia    Hypertension     Past Surgical History:  Procedure Laterality Date   CHOLECYSTECTOMY     CHOLECYSTECTOMY  2000   LEFT HEART   CATH AND CORONARY ANGIOGRAPHY Left 06/03/2017   Procedure: LEFT HEART CATH AND CORONARY ANGIOGRAPHY;  Surgeon: Callwood, Dwayne D, MD;  Location: [facility];  Service: Cardiovascular;  Laterality: Left;   LOWER EXTREMITY ANGIOGRAPHY Left 02/03/2020   Procedure: LOWER EXTREMITY ANGIOGRAPHY;  Surgeon: Tamilyn Lupien S, MD;  Location: [facility];  Service: Cardiovascular;  Laterality: Left;     Social History   Tobacco Use   Smoking status: Every Day    Packs/day: 1.00    Types: Cigarettes   Smokeless tobacco: Never  Substance Use Topics   Alcohol use: Yes    Comment: 3-4 beers per day   Drug use: No       Family History  Problem Relation Age of Onset   Diabetes Mother    Hypertension Mother    Lupus Mother    Heart attack Father    Cancer Brother    Cancer Brother      Allergies  Allergen Reactions   Acetaminophen-Codeine  Other (See Comments)    Other reaction(s): Other (See Comments) Nausea and "hot flash" Nausea and "hot flash"    Codeine Nausea Only   Varenicline Nausea Only    Sour taste in mouth   REVIEW OF SYSTEMS (Negative unless checked)   Constitutional: []Weight loss  []Fever  []Chills Cardiac: []Chest pain   []Chest pressure   []Palpitations   []Shortness of breath when laying flat   []Shortness of breath at rest   []Shortness of breath with exertion. Vascular:  [x]Pain in legs with walking   [x]Pain in legs at rest   []Pain in legs when laying flat   [x]Claudication   []Pain in feet when walking  [x]Pain in feet at rest  []Pain in feet when laying flat   []History of DVT   []Phlebitis   [x]Swelling in legs   []Varicose veins   []Non-healing ulcers Pulmonary:   []Uses home oxygen   []Productive cough   []Hemoptysis   []Wheeze  []COPD   []Asthma Neurologic:  []Dizziness  []Blackouts   []Seizures   []History of stroke   []History of TIA  []Aphasia   []Temporary blindness   []Dysphagia   []Weakness or numbness in arms   []Weakness or numbness in legs Musculoskeletal:  []Arthritis   []Joint swelling   []Joint pain   []Low back pain Hematologic:  []Easy bruising  []Easy bleeding   []Hypercoagulable state   []Anemic  []Hepatitis Gastrointestinal:  []Blood in stool   []Vomiting blood  []Gastroesophageal reflux/heartburn   []Abdominal pain Genitourinary:  []Chronic kidney disease   []Difficult urination  []Frequent urination  []Burning with urination   []Hematuria Skin:  []Rashes   []Ulcers   []Wounds Psychological:  []History of anxiety   [] History of major depression.   Physical Examination  BP 134/82   Pulse 89   Ht 5' 10" (1.778 m)   Wt 170 lb (77.1 kg)   BMI 24.39 kg/m  Gen:  WD/WN, NAD Head: [place]/AT, No temporalis wasting. Ear/Nose/Throat: Hearing grossly intact, nares w/o erythema or drainage Eyes: Conjunctiva clear. Sclera non-icteric Neck: Supple.  Trachea midline Pulmonary:  Good air  movement, no use of accessory muscles.  Cardiac: RRR, no JVD Vascular:  Vessel Right Left  Radial Palpable Palpable                          PT 1+ Palpable Not Palpable  DP Trace Palpable Not Palpable   Gastrointestinal: soft,   non-tender/non-distended. No guarding/reflex.  Musculoskeletal:  No deformity or atrophy.  Left foot is cool with delayed capillary refill.  Trace right lower extremity edema, 2+ left lower extremity edema. Neurologic: Sensation grossly intact in extremities.  Symmetrical.  Speech is fluent.  Psychiatric: Judgment intact, Mood & affect appropriate for pt's clinical situation. Dermatologic: No rashes or ulcers noted.  No cellulitis or open wounds.      Labs Recent Results (from the past 2160 hour(s))  Basic metabolic panel     Status: Abnormal   Collection Time: 12/18/20  2:55 PM  Result Value Ref Range   Sodium 130 (L) 135 - 145 mmol/L   Potassium 3.4 (L) 3.5 - 5.1 mmol/L   Chloride 90 (L) 98 - 111 mmol/L   CO2 29 22 - 32 mmol/L   Glucose, Bld 127 (H) 70 - 99 mg/dL    Comment: Glucose reference range applies only to samples taken after fasting for at least 8 hours.   BUN 9 6 - 20 mg/dL   Creatinine, Ser 0.67 0.61 - 1.24 mg/dL   Calcium 8.8 (L) 8.9 - 10.3 mg/dL   GFR, Estimated >60 >60 mL/min    Comment: (NOTE) Calculated using the CKD-EPI Creatinine Equation (2021)    Anion gap 11 5 - 15    Comment: Performed at [facility], [address] [place], [place] [address]    Radiology CT Angio Aortobifemoral W and/or Wo Contrast  Result Date: 12/18/2020 CLINICAL DATA:  Left leg pain. Injured few days ago while at work. History of arterial stents. Discoloration of the foot. EXAM: CT ANGIOGRAPHY OF ABDOMINAL AORTA WITH ILIOFEMORAL RUNOFF TECHNIQUE: Multidetector CT imaging of the abdomen, pelvis and lower extremities was performed using the standard protocol during bolus administration of intravenous contrast. Multiplanar CT image  reconstructions and MIPs were obtained to evaluate the vascular anatomy. CONTRAST:  100mL OMNIPAQUE IOHEXOL 350 MG/ML SOLN COMPARISON:  None. FINDINGS: VASCULAR Aorta: Diffuse atherosclerotic calcifications of the abdominal aorta. 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 Celiac: No significant stenosis of the celiac artery. SMA: Approximately 60% stenosis of the mid superior mesenteric artery due to focal calcified plaque best seen on image 104 of series 8. Superior mesenteric artery is otherwise patent. The right hepatic artery is replaced originating from the superior mesenteric artery. Renals: Mild to moderate stenosis at the origin of the left main renal artery. No significant stenosis of the right main renal artery. IMA: Inferior mesenteric artery is occluded at the origin but demonstrate distal opacification through retrograde collateral flow. RIGHT Lower Extremity Inflow: Right common iliac artery is diffusely calcified without high-grade stenosis. Right internal iliac artery is densely calcified but remains patent without significant stenosis. Right external iliac artery is diffusely narrowed due to calcified plaque. There is focal high-grade stenosis of the distal segment measuring approximately 60%. Outflow: Right common femoral artery is diffusely narrowed in its proximal segment there is near complete occlusion of the distal segment. Severe stenosis at the origin of the Profunda femoris artery due to calcified plaque best seen on the sagittal series. There is complete to near complete occlusion at the origin of the right superficial femoral artery. There is reconstitution of flow just beyond the origin with than a diffusely narrowed and diseased SFA. Popliteal artery is diffusely narrowed due to calcified plaque. Highest degree of stenosis measures approximately 50%. Runoff:  Tibioperoneal trunk appears   patent. Evaluation is limited due to dense atheromatous calcification. There is 2 vessel runoff to the right ankle through the posterior tibial and peroneal arteries. Anterior tibial artery opacifies to the mid lower leg. LEFT Lower Extremity Inflow: Common iliac artery is diffusely calcified but patent without significant stenosis. Internal iliac artery is diffusely calcified but remains patent. External iliac artery is diffusely calcified but patent without high-grade stenosis. Outflow: Left common femoral artery is occluded. There is occlusion of the origin of the profunda femoris artery but distal branches demonstrate opacification consistent with retrograde collateral flow. The stented left superficial femoral artery is occluded. There is faint reconstitution of flow within the popliteal artery with diminutive flow channel. Runoff: There appears to be faint opacification of the lower leg arteries with anterior tibial and peroneal arteries continue to the level the ankle. Veins: No obvious venous abnormality within the limitations of this arterial phase study. Review of the MIP images confirms the above findings. NON-VASCULAR Lower chest: No acute abnormality. Respiratory motion limits evaluation. Hepatobiliary: Diffuse hepatic steatosis. No focal hepatic lesion. Cholecystectomy clips are present in the gallbladder fossa. Pancreas: Unremarkable. No pancreatic ductal dilatation or surrounding inflammatory changes. Spleen: Normal in size without focal abnormality. Adrenals/Urinary Tract: Adrenal glands are unremarkable. Kidneys are normal, without renal calculi, focal lesion, or hydronephrosis. Bladder is unremarkable. Stomach/Bowel: Diffuse colonic diverticulosis, greatest involving the sigmoid. Appendix is normal. No significant abnormality the stomach or small bowel. Lymphatic: No enlarged abdominal or pelvic lymph nodes. Reproductive: Prostate is unremarkable. Other: Small fat  containing umbilical hernia. Mild asymmetric edema of the left lower leg and foot. Musculoskeletal: Deformity of the right posterior ninth rib consistent with healed fracture. Degenerative changes seen throughout the lower lumbar spine. Mild bilateral hip osteoarthrosis. Mild compression deformity of the T11 vertebral body of uncertain chronicity. IMPRESSION: VASCULAR 1. 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 Recommend follow-up every 2 years. This recommendation follows ACR consensus guidelines: White Paper of the ACR Incidental Findings Committee II on Vascular Findings. J Am Coll Radiol 2013; 10:789-794. 2. 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 Faint opacification of flow seen within the popliteal artery with diminutive patent channel. There does appear to be faint opacification of the anterior tibial and peroneal arteries in the left lower leg. 3. Short-segment complete to near complete occlusion at the origin of the right superficial femoral artery. The right SFA and popliteal artery are patent but extensively calcified. NON-VASCULAR 1. Hepatic steatosis. 2. Colonic diverticulosis. Electronically Signed   By: Farhaan  Mir M.D.   On: 12/18/2020 16:56   US Venous Img Lower Unilateral Left (DVT)  Result Date: 12/18/2020 CLINICAL DATA:  Pain, swelling, discoloration, and numbness of left lower extremity EXAM: LEFT LOWER EXTREMITY VENOUS DOPPLER ULTRASOUND TECHNIQUE: Gray-scale sonography with compression, as well as color and duplex ultrasound, were performed to evaluate the deep venous system(s) from the level of the common femoral vein through the popliteal and proximal calf veins. COMPARISON:  None. FINDINGS: VENOUS Normal compressibility of the common femoral, superficial femoral, and popliteal veins, as well as the visualized calf veins. Visualized portions of profunda femoral vein and great saphenous  vein unremarkable. No filling defects to suggest DVT on grayscale or color Doppler imaging. Doppler waveforms show normal direction of venous flow, normal respiratory plasticity and response to augmentation. Limited views of the contralateral common femoral vein are unremarkable. OTHER Diffuse atherosclerotic calcifications of the visualized arterial segments. 2.0   x 2.0 x 0.5 cm fluid collection in the left popliteal fossa is likely Baker cyst. Limitations: none IMPRESSION: No left lower extremity DVT. Electronically Signed   By: Farhaan  Mir M.D.   On: 12/18/2020 12:16   VAS US ABI WITH/WO TBI  Result Date: 11/28/2020  LOWER EXTREMITY DOPPLER STUDY Patient Name:  Arden H Maulding  Date of Exam:   11/28/2020 Medical Rec #: [id number]       Accession #:    [id number] Date of Birth: 06/26/1960      Patient Gender: M Patient Age:   59 years Exam Location:  [facility] Procedure:      VAS US ABI WITH/WO TBI Referring Phys: FALLON BROWN --------------------------------------------------------------------------------  Indications: Peripheral artery disease, and S/P Angioplasty with Stent.  Vascular Interventions: 02/03/2020: Aortogram and Selective Left Lower Extremity                         Angiogram. PTA of the Left Anterior Tibial Artery with                         3mm balloon. PTA of the Left SFA and most Proximal                         Popliteal Artery. Viabahn stent placement x2 to the Left                         SFA. Comparison Study: 05/30/2020 Performing Technologist: Solomon Mcclary RVS  Examination Guidelines: A complete evaluation includes at minimum, Doppler waveform signals and systolic blood pressure reading at the level of bilateral brachial, anterior tibial, and posterior tibial arteries, when vessel segments are accessible. Bilateral testing is considered an integral part of a complete examination. Photoelectric Plethysmograph (PPG) waveforms and toe systolic pressure readings are included  as required and additional duplex testing as needed. Limited examinations for reoccurring indications may be performed as noted.  ABI Findings: +---------+------------------+-----+----------+--------+ Right    Rt Pressure (mmHg)IndexWaveform  Comment  +---------+------------------+-----+----------+--------+ Brachial 145                                       +---------+------------------+-----+----------+--------+ ATA      108                    monophasic.72      +---------+------------------+-----+----------+--------+ PTA      114               0.75 monophasic         +---------+------------------+-----+----------+--------+ Great Toe106               0.70 Normal             +---------+------------------+-----+----------+--------+ +---------+------------------+-----+---------+-------+ Left     Lt Pressure (mmHg)IndexWaveform Comment +---------+------------------+-----+---------+-------+ Brachial 151                                     +---------+------------------+-----+---------+-------+ ATA      168                    biphasic 1.11    +---------+------------------+-----+---------+-------+ PTA      173                 1.15 triphasic        +---------+------------------+-----+---------+-------+ Great Toe156               1.03 Normal           +---------+------------------+-----+---------+-------+ +-------+-----------+-----------+------------+------------+ ABI/TBIToday's ABIToday's TBIPrevious ABIPrevious TBI +-------+-----------+-----------+------------+------------+ Right  .75        .70        .96         .89          +-------+-----------+-----------+------------+------------+ Left   1.15       1.03       .98         .92          +-------+-----------+-----------+------------+------------+  Right ABIs appear decreased compared to prior study on 05/30/2020. Left ABIs appear increased compared to prior study on 05/30/2020. Rt TBIs appear  decreased compared to prior study on 05/30/2020. Lt TBIs appear increased compared to prior study on 05/30/2020.  Summary: Right: Resting right ankle-brachial index indicates moderate right lower extremity arterial disease. The right toe-brachial index is normal. Left: Resting left ankle-brachial index is within normal range. No evidence of significant left lower extremity arterial disease. The left toe-brachial index is normal.  *See table(s) above for measurements and observations.  Electronically signed by Breann Losano MD on 11/28/2020 at 10:12:06 AM.    Final    VAS US AAA DUPLEX  Result Date: 11/28/2020 ABDOMINAL AORTA STUDY Patient Name:  Johannes H Wedge  Date of Exam:   11/28/2020 Medical Rec #: [id number]       Accession #:    [id number] Date of Birth: 09/29/1960      Patient Gender: M Patient Age:   59 years Exam Location:  [facility] Procedure:      VAS US AAA DUPLEX Referring Phys: FALLON BROWN --------------------------------------------------------------------------------  Indications: Follow up exam for known AAA. Vascular Interventions: 02/03/2020: Aortogram and Selective Left Lower Extremity                         Angiogram. PTA of the Left Anterior Tibial Artery with                         3mm balloon. PTA of the Left SFA and most Proximal                         Popliteal Artery. Viabahn stent placement x2 to the Left                         SFA.  Comparison Study: 02/23/2020 Performing Technologist: Solomon Mcclary RVS  Examination Guidelines: A complete evaluation includes B-mode imaging, spectral Doppler, color Doppler, and power Doppler as needed of all accessible portions of each vessel. Bilateral testing is considered an integral part of a complete examination. Limited examinations for reoccurring indications may be performed as noted.  Abdominal Aorta Findings: +-----------+-------+----------+----------+----------+--------+--------+ Location   AP (cm)Trans (cm)PSV  (cm/s)Waveform  ThrombusComments +-----------+-------+----------+----------+----------+--------+--------+ Proximal   2.37   2.93      62        monophasic                 +-----------+-------+----------+----------+----------+--------+--------+ Mid        2.40   2.55      52        monophasic                 +-----------+-------+----------+----------+----------+--------+--------+   Distal     3.60   3.72      62        monophasic                 +-----------+-------+----------+----------+----------+--------+--------+ RT CIA Prox1.3    1.3       110       triphasic                  +-----------+-------+----------+----------+----------+--------+--------+ LT CIA Prox1.1    1.2       83        biphasic                   +-----------+-------+----------+----------+----------+--------+--------+  Summary: Abdominal Aorta: There is evidence of abnormal dilatation of the proximal, mid and distal Abdominal aorta. The largest aortic measurement is 3.7 cm. The largest aortic diameter remains essentially unchanged compared to prior exam. Previous diameter measurement was 3.8 cm obtained on 02/23/2020.  *See table(s) above for measurements and observations.  Electronically signed by Adell Panek MD on 11/28/2020 at 10:12:18 AM.    Final    VAS US CAROTID  Result Date: 11/28/2020 Carotid Arterial Duplex Study Patient Name:  Quest H Caputo  Date of Exam:   11/28/2020 Medical Rec #: [id number]       Accession #:    [id number] Date of Birth: 08/16/1960      Patient Gender: M Patient Age:   59 years Exam Location:  [facility] Procedure:      VAS US CAROTID Referring Phys: FALLON BROWN --------------------------------------------------------------------------------  Indications:       Carotid artery disease. Comparison Study:  06/20/2020 Performing Technologist: Solomon Mcclary RVS  Examination Guidelines: A complete evaluation includes B-mode imaging, spectral Doppler, color Doppler,  and power Doppler as needed of all accessible portions of each vessel. Bilateral testing is considered an integral part of a complete examination. Limited examinations for reoccurring indications may be performed as noted.  Right Carotid Findings: +----------+--------+--------+--------+------------------+--------+           PSV cm/sEDV cm/sStenosisPlaque DescriptionComments +----------+--------+--------+--------+------------------+--------+ CCA Prox  83      21                                         +----------+--------+--------+--------+------------------+--------+ CCA Mid   63      16                                         +----------+--------+--------+--------+------------------+--------+ CCA Distal51      15                                         +----------+--------+--------+--------+------------------+--------+ ICA Prox  48      15                                         +----------+--------+--------+--------+------------------+--------+ ICA Mid   60      24                                         +----------+--------+--------+--------+------------------+--------+   ICA Distal61      25                                         +----------+--------+--------+--------+------------------+--------+ ECA       81      17                                         +----------+--------+--------+--------+------------------+--------+ +----------+--------+-------+--------+-------------------+           PSV cm/sEDV cmsDescribeArm Pressure (mmHG) +----------+--------+-------+--------+-------------------+ Subclavian73      0                                  +----------+--------+-------+--------+-------------------+ +---------+--------+--+--------+-+ VertebralPSV cm/s23EDV cm/s9 +---------+--------+--+--------+-+  Left Carotid Findings: +----------+--------+--------+--------+------------------+--------+           PSV cm/sEDV cm/sStenosisPlaque  DescriptionComments +----------+--------+--------+--------+------------------+--------+ CCA Prox  76      23                                         +----------+--------+--------+--------+------------------+--------+ CCA Mid   76      27                                         +----------+--------+--------+--------+------------------+--------+ CCA Distal67      23                                         +----------+--------+--------+--------+------------------+--------+ ICA Prox  57      22                                         +----------+--------+--------+--------+------------------+--------+ ICA Mid   76      29                                         +----------+--------+--------+--------+------------------+--------+ ICA Distal63      28                                         +----------+--------+--------+--------+------------------+--------+ ECA       158     34                                         +----------+--------+--------+--------+------------------+--------+ +----------+--------+--------+--------+-------------------+           PSV cm/sEDV cm/sDescribeArm Pressure (mmHG) +----------+--------+--------+--------+-------------------+ Subclavian120     0                                   +----------+--------+--------+--------+-------------------+ +---------+--------+--+--------+--+   VertebralPSV cm/s41EDV cm/s14 +---------+--------+--+--------+--+   Summary: Right Carotid: Velocities in the right ICA are consistent with a 1-39% stenosis. Left Carotid: Velocities in the left ICA are consistent with a 1-39% stenosis. Vertebrals:  Bilateral vertebral arteries demonstrate antegrade flow. Subclavians: Normal flow hemodynamics were seen in bilateral subclavian              arteries. *See table(s) above for measurements and observations.  Electronically signed by Tobyn Osgood MD on 11/28/2020 at 10:12:47 AM.    Final      Assessment/Plan Hypertension blood pressure control important in reducing the progression of atherosclerotic disease. On appropriate oral medications.     Carotid stenosis Duplex recently shows mild carotid disease in the 1 to 39% range bilaterally.  Mild disease on appropriate medical therapy.  This can be checked in 2 years.   Hypercholesterolemia lipid control important in reducing the progression of atherosclerotic disease. Continue statin therapy  AAA (abdominal aortic aneurysm) without rupture (HCC) Although I measured out in transverse that over 4 cm, it still less than 5 cm and does not need repaired at this time.  Atherosclerosis of native arteries of extremity with rest pain (HCC) I have independently reviewed his CT scan.  He has what appears to be a new left common femoral artery occlusion as well as occlusion of his left SFA interventions.  His right common femoral artery is highly stenotic as is the origin of the SFA.  His symptoms have begun new on the left leg and he clearly has ischemic rest pain requiring dangling of his foot and this is a dramatic change from only about a month ago in the office.  This is a critical and limb threatening situation.  We have discussed the grave nature of the situation we will get him on the schedule this week for bilateral femoral endarterectomies and left SFA and tibial intervention.  We have discussed that his aneurysm is still less than 5 cm and does not need to be repaired at this time which would add a layer of complexity to his already difficult situation.  I have discussed the pathophysiology and natural history of peripheral arterial disease and I have discussed the reason and rationale for treatment as soon as possible.  He voices understanding and is agreeable to proceed.    Leontine Radman, MD  12/26/2020 5:13 PM    This note was created with Dragon medical transcription system.  Any errors from dictation are purely unintentional  

## 2020-12-26 NOTE — Assessment & Plan Note (Signed)
I have independently reviewed his CT scan.  He has what appears to be a new left common femoral artery occlusion as well as occlusion of his left SFA interventions.  His right common femoral artery is highly stenotic as is the origin of the SFA.  His symptoms have begun new on the left leg and he clearly has ischemic rest pain requiring dangling of his foot and this is a dramatic change from only about a month ago in the office.  This is a critical and limb threatening situation.  We have discussed the grave nature of the situation we will get him on the schedule this week for bilateral femoral endarterectomies and left SFA and tibial intervention.  We have discussed that his aneurysm is still less than 5 cm and does not need to be repaired at this time which would add a layer of complexity to his already difficult situation.  I have discussed the pathophysiology and natural history of peripheral arterial disease and I have discussed the reason and rationale for treatment as soon as possible.  He voices understanding and is agreeable to proceed.

## 2020-12-26 NOTE — Patient Instructions (Signed)
Femoral Endarterectomy Femoral endarterectomy is surgery to clear a blocked femoral artery. The femoral artery is the main blood vessel that supplies blood to the legs. It is found in the groin area. The femoral artery can become blocked by a buildup of fat, cholesterol, calcium, and other substances (plaque). This blockage is also called peripheral vascular disease.Plaque may partially or completely block the artery or cause a blood clot to form, which can be dangerous. Femoral endarterectomy may be done as part of another blood vessel surgery. It can also be done in combination with a stent procedure. Tell a health care provider about: Any allergies you have. All medicines you are taking, including vitamins, herbs, eye drops, creams, and over-the-counter medicines. Any problems you or family members have had with anesthetic medicines. Any blood disorders you have. Any surgeries you have had. Any medical conditions you have, including diabetes or kidney problems. Whether you are pregnant or may be pregnant. What are the risks? Generally, this is a safe procedure. However, problems may occur, including: Infection. Bleeding. Allergic reactions to medicines or dyes. Nerve damage. Blood clots. These can form in the legs. Clots that break loose could travel to the heart, lungs, or brain. Return of plaque buildup. This may cause another blockage. This can happen months or years after the procedure. What happens before the procedure? Staying hydrated Follow instructions from your health care provider about hydration, which may include: Up to 2 hours before the procedure - you may continue to drink clear liquids, such as water, clear fruit juice, black coffee, and plain tea.  Eating and drinking restrictions Follow instructions from your health care provider about eating and drinking, which may include: 8 hours before the procedure - stop eating heavy meals or foods, such as meat, fried foods, or  fatty foods. 6 hours before the procedure - stop eating light meals or foods, such as toast or cereal. 6 hours before the procedure - stop drinking milk or drinks that contain milk. 2 hours before the procedure - stop drinking clear liquids. Medicines Ask your health care provider about: Starting new medicines or changing or stopping your regular medicines. This is especially important if you are taking diabetes medicines or blood thinners. Taking medicines such as aspirin or ibuprofen. These medicines can thin your blood. Do not take these medicines unless your health care provider tells you to take them. Taking over-the-counter medicines, vitamins, herbs, and supplements. Surgery safety Ask your health care provider: How your surgery site will be marked. What steps will be taken to help prevent infection. These steps may include: Removing hair at the surgery site. Washing skin with a germ-killing soap. Receiving antibiotic medicine. General instructions Do not use any products that contain nicotine or tobacco for at least 4 weeks before the procedure. These products include cigarettes, chewing tobacco, and vaping devices, such as e-cigarettes. If you need help quitting, ask your health care provider. Plan to have a responsible adult take you home from the hospital or clinic. Plan to have a responsible adult care for you for the time you are told after you leave the hospital or clinic. This is important. You may have tests, including: Blood tests. A Doppler ultrasonogram. This test uses sound waves to check blood flow through your femoral artery and leg. An angiogram. This involves having dye put into your blood and then having X-rays taken. What happens during the procedure? Small monitors will be placed on your body. These will be used to check your heart  rate, breathing rate, blood pressure, and oxygen level. An IV will be inserted into one of your veins. You will be given one or more  of the following: A medicine to help you relax (sedative). A medicine to numb the area (local anesthetic). A medicine to make you fall asleep (general anesthetic). A medicine that is injected into your spine to numb the area below and slightly above the injection site (spinal anesthetic). A medicine that is injected into an area of your body to numb everything below the injection site (regional anesthetic). An incision will be made in your groin area, over the blocked part of the femoral artery. A clamp will be placed on the femoral artery above and below the blockage. This will stop blood from flowing through the blocked area. An incision will be made in the femoral artery so the health care provider can access the plaque. The plaque will be stripped away from the inner walls of the artery. The femoral artery will be closed using a patch made of prosthetic material or from one of your own veins. This will make the artery wider. It also helps keep the artery from getting narrow again. The clamp will be removed so that blood can flow through the femoral artery again. The incision in your groin will be closed with staples or stitches (sutures). A bandage (dressing) will be placed over the incision. The procedure may vary among health care providers and hospitals. What happens after the procedure? Your blood pressure, heart rate, breathing rate, and blood oxygen level will be monitored until you leave the hospital or clinic. You may have to wear compression stockings. These stockings help to prevent blood clots and reduce swelling in your legs. You will be given pain medicine as needed. If you were given a sedative during the procedure, it can affect you for several hours. Do not drive or operate machinery until your health care provider says that it is safe. Summary Femoral endarterectomy is surgery to clear a blocked femoral artery. The femoral artery is the main blood vessel that supplies blood  to the legs. It is found in the groin area. Before surgery, ask your health care provider before starting new medicines or changing or stopping your regular medicines. If you were given a sedative during the procedure, you should not drive or operate machinery until your health care provider says that it is safe. This information is not intended to replace advice given to you by your health care provider. Make sure you discuss any questions you have with your health care provider. Document Revised: 10/11/2019 Document Reviewed: 10/11/2019 [employer]vier Patient Education  2022 [employer].

## 2020-12-26 NOTE — H&P (View-Only) (Signed)
MRN : [id number]  Scott [name] is a 60 y.o. (08-May-1960) male who presents with chief complaint of  Chief Complaint  Patient presents with   Follow-up    [facility]  .  History of Present Illness: Patient returns today in follow up of his PAD with new left leg symptoms since his last office visit 1 month ago.  He was seen in the ER and found to have just significant worsening particularly in his left leg which is now the symptomatic leg with symptoms concerning for rest pain.  He has been dangling his foot.  His foot is swollen.  He can only walk a few steps before having to stop due to pain. I have independently reviewed his CT scan.  He has what appears to be a new left common femoral artery occlusion as well as occlusion of his left SFA interventions.  His right common femoral artery is highly stenotic as is the origin of the SFA.  Current Outpatient Medications  Medication Sig Dispense Refill   acetaminophen (TYLENOL) 325 MG tablet Take 650 mg by mouth every 6 (six) hours as needed.     amLODipine (NORVASC) 10 MG tablet Take 10 mg by mouth daily.     amLODipine (NORVASC) 5 MG tablet Take 5 mg by mouth daily.  1   aspirin 325 MG EC tablet Take 325 mg by mouth 2 (two) times daily.     aspirin EC 81 MG tablet Take 1 tablet (81 mg total) by mouth daily. 150 tablet 2   atorvastatin (LIPITOR) 10 MG tablet Take 1 tablet (10 mg total) by mouth daily. 30 tablet 11   atorvastatin (LIPITOR) 40 MG tablet Take 40 mg by mouth daily.     BREO ELLIPTA 200-25 MCG/INH AEPB Inhale 1 puff into the lungs daily.     clopidogrel (PLAVIX) 75 MG tablet Take 1 tablet (75 mg total) by mouth daily. 30 tablet 11   Fluticasone-Salmeterol (ADVAIR) 500-50 MCG/DOSE AEPB      HYDROcodone-acetaminophen (NORCO/VICODIN) 5-325 MG tablet Take 1 tablet by mouth every 4 (four) hours as needed.     isosorbide mononitrate (IMDUR) 30 MG 24 hr tablet Take 30 mg by mouth daily.  5   lisinopril-hydrochlorothiazide  (PRINZIDE,ZESTORETIC) 20-25 MG per tablet Take 1 tablet by mouth daily.     lovastatin (MEVACOR) 20 MG tablet TAKE 1 TABLET (20 MG TOTAL) BY MOUTH DAILY WITH DINNER.     metoprolol succinate (TOPROL-XL) 25 MG 24 hr tablet Take 25 mg by mouth daily.      naproxen (NAPROSYN) 500 MG tablet Take 1 tablet (500 mg total) by mouth 2 (two) times daily. 30 tablet 0   predniSONE (STERAPRED UNI-PAK 21 TAB) 10 MG (21) TBPK tablet Dispense one 6 day pack. Take as directed with food. 21 tablet 0   sodium chloride (OCEAN) 0.65 % SOLN nasal spray Place 1 spray into both nostrils daily as needed for congestion.     traMADol (ULTRAM) 50 MG tablet Take 1 tablet (50 mg total) by mouth every 6 (six) hours as needed. 20 tablet 0   No current facility-administered medications for this visit.    Past Medical History:  Diagnosis Date   COPD (chronic obstructive pulmonary disease) (HCC)    Coronary artery disease    Dysrhythmia    Hypertension     Past Surgical History:  Procedure Laterality Date   CHOLECYSTECTOMY     CHOLECYSTECTOMY  2000   LEFT HEART  CATH AND CORONARY ANGIOGRAPHY Left 06/03/2017   Procedure: LEFT HEART CATH AND CORONARY ANGIOGRAPHY;  Surgeon: Alwyn Pea, MD;  Location: [facility] INVASIVE CV LAB;  Service: Cardiovascular;  Laterality: Left;   LOWER EXTREMITY ANGIOGRAPHY Left 02/03/2020   Procedure: LOWER EXTREMITY ANGIOGRAPHY;  Surgeon: Annice Needy, MD;  Location: [facility] INVASIVE CV LAB;  Service: Cardiovascular;  Laterality: Left;     Social History   Tobacco Use   Smoking status: Every Day    Packs/day: 1.00    Types: Cigarettes   Smokeless tobacco: Never  Substance Use Topics   Alcohol use: Yes    Comment: 3-4 beers per day   Drug use: No       Family History  Problem Relation Age of Onset   Diabetes Mother    Hypertension Mother    Lupus Mother    Heart attack Father    Cancer Brother    Cancer Brother      Allergies  Allergen Reactions   Acetaminophen-Codeine  Other (See Comments)    Other reaction(s): Other (See Comments) Nausea and "hot flash" Nausea and "hot flash"    Codeine Nausea Only   Varenicline Nausea Only    Sour taste in mouth   REVIEW OF SYSTEMS (Negative unless checked)   Constitutional: [] Weight loss  [] Fever  [] Chills Cardiac: [] Chest pain   [] Chest pressure   [] Palpitations   [] Shortness of breath when laying flat   [] Shortness of breath at rest   [] Shortness of breath with exertion. Vascular:  [x] Pain in legs with walking   [x] Pain in legs at rest   [] Pain in legs when laying flat   [x] Claudication   [] Pain in feet when walking  [x] Pain in feet at rest  [] Pain in feet when laying flat   [] History of DVT   [] Phlebitis   [x] Swelling in legs   [] Varicose veins   [] Non-healing ulcers Pulmonary:   [] Uses home oxygen   [] Productive cough   [] Hemoptysis   [] Wheeze  [] COPD   [] Asthma Neurologic:  [] Dizziness  [] Blackouts   [] Seizures   [] History of stroke   [] History of TIA  [] Aphasia   [] Temporary blindness   [] Dysphagia   [] Weakness or numbness in arms   [] Weakness or numbness in legs Musculoskeletal:  [] Arthritis   [] Joint swelling   [] Joint pain   [] Low back pain Hematologic:  [] Easy bruising  [] Easy bleeding   [] Hypercoagulable state   [] Anemic  [] Hepatitis Gastrointestinal:  [] Blood in stool   [] Vomiting blood  [] Gastroesophageal reflux/heartburn   [] Abdominal pain Genitourinary:  [] Chronic kidney disease   [] Difficult urination  [] Frequent urination  [] Burning with urination   [] Hematuria Skin:  [] Rashes   [] Ulcers   [] Wounds Psychological:  [] History of anxiety   []  History of major depression.   Physical Examination  BP 134/82   Pulse 89   Ht 5\' 10"  (1.778 m)   Wt 170 lb (77.1 kg)   BMI 24.39 kg/m  Gen:  WD/WN, NAD Head: [place]/AT, No temporalis wasting. Ear/Nose/Throat: Hearing grossly intact, nares w/o erythema or drainage Eyes: Conjunctiva clear. Sclera non-icteric Neck: Supple.  Trachea midline Pulmonary:  Good air  movement, no use of accessory muscles.  Cardiac: RRR, no JVD Vascular:  Vessel Right Left  Radial Palpable Palpable                          PT 1+ Palpable Not Palpable  DP Trace Palpable Not Palpable   Gastrointestinal: soft,  non-tender/non-distended. No guarding/reflex.  Musculoskeletal:  No deformity or atrophy.  Left foot is cool with delayed capillary refill.  Trace right lower extremity edema, 2+ left lower extremity edema. Neurologic: Sensation grossly intact in extremities.  Symmetrical.  Speech is fluent.  Psychiatric: Judgment intact, Mood & affect appropriate for pt's clinical situation. Dermatologic: No rashes or ulcers noted.  No cellulitis or open wounds.      Labs Recent Results (from the past 2160 hour(s))  Basic metabolic panel     Status: Abnormal   Collection Time: 12/18/20  2:55 PM  Result Value Ref Range   Sodium 130 (L) 135 - 145 mmol/L   Potassium 3.4 (L) 3.5 - 5.1 mmol/L   Chloride 90 (L) 98 - 111 mmol/L   CO2 29 22 - 32 mmol/L   Glucose, Bld 127 (H) 70 - 99 mg/dL    Comment: Glucose reference range applies only to samples taken after fasting for at least 8 hours.   BUN 9 6 - 20 mg/dL   Creatinine, Ser 0.34 0.61 - 1.24 mg/dL   Calcium 8.8 (L) 8.9 - 10.3 mg/dL   GFR, Estimated >74 >25 mL/min    Comment: (NOTE) Calculated using the CKD-EPI Creatinine Equation (2021)    Anion gap 11 5 - 15    Comment: Performed at [facility], [address] [place], [place] [address]    Radiology CT Angio Aortobifemoral W and/or Wo Contrast  Result Date: 12/18/2020 CLINICAL DATA:  Left leg pain. Injured few days ago while at work. History of arterial stents. Discoloration of the foot. EXAM: CT ANGIOGRAPHY OF ABDOMINAL AORTA WITH ILIOFEMORAL RUNOFF TECHNIQUE: Multidetector CT imaging of the abdomen, pelvis and lower extremities was performed using the standard protocol during bolus administration of intravenous contrast. Multiplanar CT image  reconstructions and MIPs were obtained to evaluate the vascular anatomy. CONTRAST:  OMNIPAQUE IOHEXOL 350 MG/ML SOLN COMPARISON:  None. FINDINGS: VASCULAR Aorta: Diffuse atherosclerotic calcifications of the abdominal aorta. 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 Celiac: No significant stenosis of the celiac artery. SMA: Approximately 60% stenosis of the mid superior mesenteric artery due to focal calcified plaque best seen on image 104 of series 8. Superior mesenteric artery is otherwise patent. The right hepatic artery is replaced originating from the superior mesenteric artery. Renals: Mild to moderate stenosis at the origin of the left main renal artery. No significant stenosis of the right main renal artery. IMA: Inferior mesenteric artery is occluded at the origin but demonstrate distal opacification through retrograde collateral flow. RIGHT Lower Extremity Inflow: Right common iliac artery is diffusely calcified without high-grade stenosis. Right internal iliac artery is densely calcified but remains patent without significant stenosis. Right external iliac artery is diffusely narrowed due to calcified plaque. There is focal high-grade stenosis of the distal segment measuring approximately 60%. Outflow: Right common femoral artery is diffusely narrowed in its proximal segment there is near complete occlusion of the distal segment. Severe stenosis at the origin of the Profunda femoris artery due to calcified plaque best seen on the sagittal series. There is complete to near complete occlusion at the origin of the right superficial femoral artery. There is reconstitution of flow just beyond the origin with than a diffusely narrowed and diseased SFA. Popliteal artery is diffusely narrowed due to calcified plaque. Highest degree of stenosis measures approximately 50%. Runoff:  Tibioperoneal trunk appears  patent. Evaluation is limited due to dense atheromatous calcification. There is 2 vessel runoff to the right ankle through the posterior tibial and peroneal arteries. Anterior tibial artery opacifies to the mid lower leg. LEFT Lower Extremity Inflow: Common iliac artery is diffusely calcified but patent without significant stenosis. Internal iliac artery is diffusely calcified but remains patent. External iliac artery is diffusely calcified but patent without high-grade stenosis. Outflow: Left common femoral artery is occluded. There is occlusion of the origin of the profunda femoris artery but distal branches demonstrate opacification consistent with retrograde collateral flow. The stented left superficial femoral artery is occluded. There is faint reconstitution of flow within the popliteal artery with diminutive flow channel. Runoff: There appears to be faint opacification of the lower leg arteries with anterior tibial and peroneal arteries continue to the level the ankle. Veins: No obvious venous abnormality within the limitations of this arterial phase study. Review of the MIP images confirms the above findings. NON-VASCULAR Lower chest: No acute abnormality. Respiratory motion limits evaluation. Hepatobiliary: Diffuse hepatic steatosis. No focal hepatic lesion. Cholecystectomy clips are present in the gallbladder fossa. Pancreas: Unremarkable. No pancreatic ductal dilatation or surrounding inflammatory changes. Spleen: Normal in size without focal abnormality. Adrenals/Urinary Tract: Adrenal glands are unremarkable. Kidneys are normal, without renal calculi, focal lesion, or hydronephrosis. Bladder is unremarkable. Stomach/Bowel: Diffuse colonic diverticulosis, greatest involving the sigmoid. Appendix is normal. No significant abnormality the stomach or small bowel. Lymphatic: No enlarged abdominal or pelvic lymph nodes. Reproductive: Prostate is unremarkable. Other: Small fat  containing umbilical hernia. Mild asymmetric edema of the left lower leg and foot. Musculoskeletal: Deformity of the right posterior ninth rib consistent with healed fracture. Degenerative changes seen throughout the lower lumbar spine. Mild bilateral hip osteoarthrosis. Mild compression deformity of the T11 vertebral body of uncertain chronicity. IMPRESSION: VASCULAR 1. 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 Recommend follow-up every 2 years. This recommendation follows ACR consensus guidelines: White Paper of the ACR Incidental Findings Committee II on Vascular Findings. J Am Coll Radiol 2013; 10:789-794. 2. 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 Faint opacification of flow seen within the popliteal artery with diminutive patent channel. There does appear to be faint opacification of the anterior tibial and peroneal arteries in the left lower leg. 3. Short-segment complete to near complete occlusion at the origin of the right superficial femoral artery. The right SFA and popliteal artery are patent but extensively calcified. NON-VASCULAR 1. Hepatic steatosis. 2. Colonic diverticulosis. Electronically Signed   By: Acquanetta Belling M.D.   On: 12/18/2020 16:56   US Venous Img Lower Unilateral Left (DVT)  Result Date: 12/18/2020 CLINICAL DATA:  Pain, swelling, discoloration, and numbness of left lower extremity EXAM: LEFT LOWER EXTREMITY VENOUS DOPPLER ULTRASOUND TECHNIQUE: Gray-scale sonography with compression, as well as color and duplex ultrasound, were performed to evaluate the deep venous system(s) from the level of the common femoral vein through the popliteal and proximal calf veins. COMPARISON:  None. FINDINGS: VENOUS Normal compressibility of the common femoral, superficial femoral, and popliteal veins, as well as the visualized calf veins. Visualized portions of profunda femoral vein and great saphenous  vein unremarkable. No filling defects to suggest DVT on grayscale or color Doppler imaging. Doppler waveforms show normal direction of venous flow, normal respiratory plasticity and response to augmentation. Limited views of the contralateral common femoral vein are unremarkable. OTHER Diffuse atherosclerotic calcifications of the visualized arterial segments. 2.0  x 2.0 x 0.5 cm fluid collection in the left popliteal fossa is likely Baker cyst. Limitations: none IMPRESSION: No left lower extremity DVT. Electronically Signed   By: Acquanetta Belling M.D.   On: 12/18/2020 12:16   VAS [place] ABI WITH/WO TBI  Result Date: 11/28/2020  LOWER EXTREMITY DOPPLER STUDY Patient Name:  JAMESPAUL SECRIST  Date of Exam:   11/28/2020 Medical Rec #: [id number]       Accession #:    [id number] Date of Birth: [birth year]      Patient Gender: M Patient Age:   80 years Exam Location:  [facility] Procedure:      VAS [place] ABI WITH/WO TBI Referring Phys: Sheppard Plumber --------------------------------------------------------------------------------  Indications: Peripheral artery disease, and S/P Angioplasty with Stent.  Vascular Interventions: 02/03/2020: Aortogram and Selective Left Lower Extremity                         Angiogram. PTA of the Left Anterior Tibial Artery with                         3mm balloon. PTA of the Left SFA and most Proximal                         Popliteal Artery. Viabahn stent placement x2 to the Left                         SFA. Comparison Study: 05/30/2020 Performing Technologist: Debbe Bales RVS  Examination Guidelines: A complete evaluation includes at minimum, Doppler waveform signals and systolic blood pressure reading at the level of bilateral brachial, anterior tibial, and posterior tibial arteries, when vessel segments are accessible. Bilateral testing is considered an integral part of a complete examination. Photoelectric Plethysmograph (PPG) waveforms and toe systolic pressure readings are included  as required and additional duplex testing as needed. Limited examinations for reoccurring indications may be performed as noted.  ABI Findings: +---------+------------------+-----+----------+--------+ Right    Rt Pressure (mmHg)IndexWaveform  Comment  +---------+------------------+-----+----------+--------+ Brachial 145                                       +---------+------------------+-----+----------+--------+ ATA      108                    monophasic.72      +---------+------------------+-----+----------+--------+ PTA      114               0.75 monophasic         +---------+------------------+-----+----------+--------+ Great Toe106               0.70 Normal             +---------+------------------+-----+----------+--------+ +---------+------------------+-----+---------+-------+ Left     Lt Pressure (mmHg)IndexWaveform Comment +---------+------------------+-----+---------+-------+ Brachial 151                                     +---------+------------------+-----+---------+-------+ ATA      168                    biphasic 1.11    +---------+------------------+-----+---------+-------+ PTA      173  1.15 triphasic        +---------+------------------+-----+---------+-------+ Great Toe156               1.03 Normal           +---------+------------------+-----+---------+-------+ +-------+-----------+-----------+------------+------------+ ABI/TBIToday's ABIToday's TBIPrevious ABIPrevious TBI +-------+-----------+-----------+------------+------------+ Right  .75        .70        .96         .89          +-------+-----------+-----------+------------+------------+ Left   1.15       1.03       .98         .92          +-------+-----------+-----------+------------+------------+  Right ABIs appear decreased compared to prior study on 05/30/2020. Left ABIs appear increased compared to prior study on 05/30/2020. Rt TBIs appear  decreased compared to prior study on 05/30/2020. Lt TBIs appear increased compared to prior study on 05/30/2020.  Summary: Right: Resting right ankle-brachial index indicates moderate right lower extremity arterial disease. The right toe-brachial index is normal. Left: Resting left ankle-brachial index is within normal range. No evidence of significant left lower extremity arterial disease. The left toe-brachial index is normal.  *See table(s) above for measurements and observations.  Electronically signed by Festus Barren MD on 11/28/2020 at 10:12:06 AM.    Final    VAS [place] AAA DUPLEX  Result Date: 11/28/2020 ABDOMINAL AORTA STUDY Patient Name:  OSAMA COLESON  Date of Exam:   11/28/2020 Medical Rec #: [id number]       Accession #:    [id number] Date of Birth: [birth year]      Patient Gender: M Patient Age:   72 years Exam Location:  [facility] Procedure:      VAS [place] AAA DUPLEX Referring Phys: Sheppard Plumber --------------------------------------------------------------------------------  Indications: Follow up exam for known AAA. Vascular Interventions: 02/03/2020: Aortogram and Selective Left Lower Extremity                         Angiogram. PTA of the Left Anterior Tibial Artery with                         3mm balloon. PTA of the Left SFA and most Proximal                         Popliteal Artery. Viabahn stent placement x2 to the Left                         SFA.  Comparison Study: 02/23/2020 Performing Technologist: Debbe Bales RVS  Examination Guidelines: A complete evaluation includes B-mode imaging, spectral Doppler, color Doppler, and power Doppler as needed of all accessible portions of each vessel. Bilateral testing is considered an integral part of a complete examination. Limited examinations for reoccurring indications may be performed as noted.  Abdominal Aorta Findings: +-----------+-------+----------+----------+----------+--------+--------+ Location   AP (cm)Trans (cm)PSV  (cm/s)Waveform  ThrombusComments +-----------+-------+----------+----------+----------+--------+--------+ Proximal   2.37   2.93      62        monophasic                 +-----------+-------+----------+----------+----------+--------+--------+ Mid        2.40   2.55      52        monophasic                 +-----------+-------+----------+----------+----------+--------+--------+  Distal     3.60   3.72      62        monophasic                 +-----------+-------+----------+----------+----------+--------+--------+ RT CIA Prox1.3    1.3       110       triphasic                  +-----------+-------+----------+----------+----------+--------+--------+ LT CIA Prox1.1    1.2       83        biphasic                   +-----------+-------+----------+----------+----------+--------+--------+  Summary: Abdominal Aorta: There is evidence of abnormal dilatation of the proximal, mid and distal Abdominal aorta. The largest aortic measurement is 3.7 cm. The largest aortic diameter remains essentially unchanged compared to prior exam. Previous diameter measurement was 3.8 cm obtained on 02/23/2020.  *See table(s) above for measurements and observations.  Electronically signed by Festus Barren MD on 11/28/2020 at 10:12:18 AM.    Final    VAS US CAROTID  Result Date: 11/28/2020 Carotid Arterial Duplex Study Patient Name:  KENDRIK MCSHAN  Date of Exam:   11/28/2020 Medical Rec #: [id number]       Accession #:    [id number] Date of Birth: 1960-05-16      Patient Gender: M Patient Age:   31 years Exam Location:  [facility] Procedure:      VAS US CAROTID Referring Phys: Sheppard Plumber --------------------------------------------------------------------------------  Indications:       Carotid artery disease. Comparison Study:  06/20/2020 Performing Technologist: Debbe Bales RVS  Examination Guidelines: A complete evaluation includes B-mode imaging, spectral Doppler, color Doppler,  and power Doppler as needed of all accessible portions of each vessel. Bilateral testing is considered an integral part of a complete examination. Limited examinations for reoccurring indications may be performed as noted.  Right Carotid Findings: +----------+--------+--------+--------+------------------+--------+           PSV cm/sEDV cm/sStenosisPlaque DescriptionComments +----------+--------+--------+--------+------------------+--------+ CCA Prox  83      21                                         +----------+--------+--------+--------+------------------+--------+ CCA Mid   63      16                                         +----------+--------+--------+--------+------------------+--------+ CCA Distal51      15                                         +----------+--------+--------+--------+------------------+--------+ ICA Prox  48      15                                         +----------+--------+--------+--------+------------------+--------+ ICA Mid   60      24                                         +----------+--------+--------+--------+------------------+--------+  ICA Distal61      25                                         +----------+--------+--------+--------+------------------+--------+ ECA       81      17                                         +----------+--------+--------+--------+------------------+--------+ +----------+--------+-------+--------+-------------------+           PSV cm/sEDV cmsDescribeArm Pressure (mmHG) +----------+--------+-------+--------+-------------------+ [id number]      0                                  +----------+--------+-------+--------+-------------------+ +---------+--------+--+--------+-+ VertebralPSV cm/s23EDV cm/s9 +---------+--------+--+--------+-+  Left Carotid Findings: +----------+--------+--------+--------+------------------+--------+           PSV cm/sEDV cm/sStenosisPlaque  DescriptionComments +----------+--------+--------+--------+------------------+--------+ CCA Prox  76      23                                         +----------+--------+--------+--------+------------------+--------+ CCA Mid   76      27                                         +----------+--------+--------+--------+------------------+--------+ CCA Distal67      23                                         +----------+--------+--------+--------+------------------+--------+ ICA Prox  57      22                                         +----------+--------+--------+--------+------------------+--------+ ICA Mid   76      29                                         +----------+--------+--------+--------+------------------+--------+ ICA Distal63      28                                         +----------+--------+--------+--------+------------------+--------+ ECA       158     34                                         +----------+--------+--------+--------+------------------+--------+ +----------+--------+--------+--------+-------------------+           PSV cm/sEDV cm/sDescribeArm Pressure (mmHG) +----------+--------+--------+--------+-------------------+ Subclavian120     0                                   +----------+--------+--------+--------+-------------------+ +---------+--------+--+--------+--+  VertebralPSV cm/s41EDV cm/s14 +---------+--------+--+--------+--+   Summary: Right Carotid: Velocities in the right ICA are consistent with a 1-39% stenosis. Left Carotid: Velocities in the left ICA are consistent with a 1-39% stenosis. Vertebrals:  Bilateral vertebral arteries demonstrate antegrade flow. Subclavians: Normal flow hemodynamics were seen in bilateral subclavian              arteries. *See table(s) above for measurements and observations.  Electronically signed by Festus Barren MD on 11/28/2020 at 10:12:47 AM.    Final      Assessment/Plan Hypertension blood pressure control important in reducing the progression of atherosclerotic disease. On appropriate oral medications.     Carotid stenosis Duplex recently shows mild carotid disease in the 1 to 39% range bilaterally.  Mild disease on appropriate medical therapy.  This can be checked in 2 years.   Hypercholesterolemia lipid control important in reducing the progression of atherosclerotic disease. Continue statin therapy  AAA (abdominal aortic aneurysm) without rupture (HCC) Although I measured out in transverse that over 4 cm, it still less than 5 cm and does not need repaired at this time.  Atherosclerosis of native arteries of extremity with rest pain (HCC) I have independently reviewed his CT scan.  He has what appears to be a new left common femoral artery occlusion as well as occlusion of his left SFA interventions.  His right common femoral artery is highly stenotic as is the origin of the SFA.  His symptoms have begun new on the left leg and he clearly has ischemic rest pain requiring dangling of his foot and this is a dramatic change from only about a month ago in the office.  This is a critical and limb threatening situation.  We have discussed the grave nature of the situation we will get him on the schedule this week for bilateral femoral endarterectomies and left SFA and tibial intervention.  We have discussed that his aneurysm is still less than 5 cm and does not need to be repaired at this time which would add a layer of complexity to his already difficult situation.  I have discussed the pathophysiology and natural history of peripheral arterial disease and I have discussed the reason and rationale for treatment as soon as possible.  He voices understanding and is agreeable to proceed.    Festus Barren, MD  12/26/2020 5:13 PM    This note was created with Dragon medical transcription system.  Any errors from dictation are purely unintentional

## 2020-12-26 NOTE — Assessment & Plan Note (Signed)
Although I measured out in transverse that over 4 cm, it still less than 5 cm and does not need repaired at this time.

## 2020-12-28 ENCOUNTER — Other Ambulatory Visit (INDEPENDENT_AMBULATORY_CARE_PROVIDER_SITE_OTHER): Payer: Self-pay | Admitting: Nurse Practitioner

## 2020-12-28 ENCOUNTER — Encounter: Payer: Self-pay | Admitting: Urgent Care

## 2020-12-28 ENCOUNTER — Other Ambulatory Visit
Admission: RE | Admit: 2020-12-28 | Discharge: 2020-12-28 | Disposition: A | Discharge status: Home / Self Care | Payer: 59 | Source: Ambulatory Visit

## 2020-12-28 ENCOUNTER — Other Ambulatory Visit: Payer: Self-pay

## 2020-12-28 ENCOUNTER — Other Ambulatory Visit
Admission: RE | Admit: 2020-12-28 | Discharge: 2020-12-28 | Disposition: A | Discharge status: Home / Self Care | Payer: 59 | Source: Ambulatory Visit | Attending: Vascular Surgery | Admitting: Vascular Surgery

## 2020-12-28 DIAGNOSIS — Z01818 Encounter for other preprocedural examination: Secondary | ICD-10-CM | POA: Insufficient documentation

## 2020-12-28 DIAGNOSIS — Z20822 Contact with and (suspected) exposure to covid-19: Secondary | ICD-10-CM | POA: Insufficient documentation

## 2020-12-28 DIAGNOSIS — Z01812 Encounter for preprocedural laboratory examination: Secondary | ICD-10-CM | POA: Insufficient documentation

## 2020-12-28 HISTORY — DX: Prediabetes: R73.03

## 2020-12-28 HISTORY — DX: Anxiety disorder, unspecified: F41.9

## 2020-12-28 HISTORY — DX: Dyspnea, unspecified: R06.00

## 2020-12-28 HISTORY — DX: Depression, unspecified: F32.A

## 2020-12-28 HISTORY — DX: Unspecified osteoarthritis, unspecified site: M19.90

## 2020-12-28 LAB — CBC WITH DIFFERENTIAL/PLATELET
Abs Immature Granulocytes: 0.11 10*3/uL — ABNORMAL HIGH (ref 0.00–0.07)
Basophils Absolute: 0.1 10*3/uL (ref 0.0–0.1)
Basophils Relative: 0 %
Eosinophils Absolute: 0 10*3/uL (ref 0.0–0.5)
Eosinophils Relative: 0 %
HCT: 39.2 % (ref 39.0–52.0)
Hemoglobin: 14.7 g/dL (ref 13.0–17.0)
Immature Granulocytes: 1 %
Lymphocytes Relative: 11 %
Lymphs Abs: 1.6 10*3/uL (ref 0.7–4.0)
MCH: 33.3 pg (ref 26.0–34.0)
MCHC: 37.5 g/dL — ABNORMAL HIGH (ref 30.0–36.0)
MCV: 88.7 fL (ref 80.0–100.0)
Monocytes Absolute: 0.9 10*3/uL (ref 0.1–1.0)
Monocytes Relative: 6 %
Neutro Abs: 12.2 10*3/uL — ABNORMAL HIGH (ref 1.7–7.7)
Neutrophils Relative %: 82 %
Platelets: 382 10*3/uL (ref 150–400)
RBC: 4.42 MIL/uL (ref 4.22–5.81)
RDW: 12.7 % (ref 11.5–15.5)
WBC: 14.9 10*3/uL — ABNORMAL HIGH (ref 4.0–10.5)
nRBC: 0 % (ref 0.0–0.2)

## 2020-12-28 LAB — SARS CORONAVIRUS 2 (TAT 6-24 HRS): SARS Coronavirus 2: NEGATIVE

## 2020-12-28 LAB — BASIC METABOLIC PANEL
Anion gap: 11 (ref 5–15)
BUN: 13 mg/dL (ref 6–20)
CO2: 26 mmol/L (ref 22–32)
Calcium: 8.6 mg/dL — ABNORMAL LOW (ref 8.9–10.3)
Chloride: 87 mmol/L — ABNORMAL LOW (ref 98–111)
Creatinine, Ser: 0.71 mg/dL (ref 0.61–1.24)
GFR, Estimated: >60 mL/min (ref >60)
Glucose, Bld: 182 mg/dL — ABNORMAL HIGH (ref 70–99)
Potassium: 3.8 mmol/L (ref 3.5–5.1)
Sodium: 124 mmol/L — ABNORMAL LOW (ref 135–145)

## 2020-12-28 LAB — TYPE AND SCREEN
ABO/RH(D): A POS
Antibody Screen: NEGATIVE

## 2020-12-28 NOTE — Progress Notes (Signed)
  [facility] Perioperative Services: Pre-Admission/Anesthesia Testing  Abnormal Lab Notification   Date: 12/28/20  Name: Scott [name] MRN:   [id number]  Re: Abnormal labs noted during PAT appointment   Provider(s) Notified: Annice Needy, MD Notification mode: Routed and/or faxed via [facility]   ABNORMAL LAB VALUE(S): Lab Results  Component Value Date   WBC 14.9 (H) 12/28/2020   NEUTROABS 12.2 (H) 12/28/2020   NA 124 (L) 12/28/2020   Notes:  Patient is scheduled for a BILATERAL FEMORAL ENDARTERECTOMIES on 12/29/2020. In review of patient's EMS, it is noted that he is on daily HCTZ. Sending to surgeon and APP for review.   This is a Personal assistant; no formal response is required.  Quentin Mulling, MSN, APRN, FNP-C, CEN [facility]  Peri-operative Services Nurse Practitioner Phone: [phone] Fax: [phone] 12/28/20 11:57 AM

## 2020-12-28 NOTE — Patient Instructions (Addendum)
Your procedure is scheduled on: 12/29/20 Report to the Registration Desk on the 1st floor of the [facility]. To find out your arrival time, please call [phone] between 1PM - 3PM on: 12/28/20  REMEMBER: Instructions that are not followed completely may result in serious medical risk, up to and including death; or upon the discretion of your surgeon and anesthesiologist your surgery may need to be rescheduled.  Do not eat food or drink any fluids after midnight the night before surgery.  No gum chewing, lozengers or hard candies.   TAKE THESE MEDICATIONS THE MORNING OF SURGERY WITH A SIP OF WATER: - amLODipine (NORVASC) 10 MG tablet - BREO ELLIPTA 200-25 MCG/INH AEPB - isosorbide mononitrate (IMDUR) 30 MG 24 hr tablet - metoprolol succinate (TOPROL-XL) 25 MG 24 hr tablet  Follow recommendations from Cardiologist, Pulmonologist or PCP regarding stopping Aspirin, Coumadin, Plavix, Eliquis, Pradaxa, or Pletal.  One week prior to surgery: Stop Anti-inflammatories (NSAIDS) such as Advil, Aleve, Ibuprofen, Motrin, Naproxen, Naprosyn and Aspirin based products such as Excedrin, Goodys Powder, BC Powder.  Stop ANY OVER THE COUNTER supplements until after surgery.  You may however, continue to take Tylenol if needed for pain up until the day of surgery.  No Alcohol for 24 hours before or after surgery.  No Smoking including e-cigarettes for 24 hours prior to surgery.  No chewable tobacco products for at least 6 hours prior to surgery.  No nicotine patches on the day of surgery.  Do not use any "recreational" drugs for at least a week prior to your surgery.  Please be advised that the combination of cocaine and anesthesia may have negative outcomes, up to and including death. If you test positive for cocaine, your surgery will be cancelled.  On the morning of surgery brush your teeth with toothpaste and water, you may rinse your mouth with mouthwash if you wish. Do not swallow any  toothpaste or mouthwash.  Do not wear jewelry, make-up, hairpins, clips or nail polish.  Do not wear lotions, powders, or perfumes.   Do not shave body from the neck down 48 hours prior to surgery just in case you cut yourself which could leave a site for infection.  Also, freshly shaved skin may become irritated if using the CHG soap.  Contact lenses, hearing aids and dentures may not be worn into surgery.  Do not bring valuables to the hospital. [facility] is not responsible for any missing/lost belongings or valuables.   Notify your doctor if there is any change in your medical condition (cold, fever, infection).  Wear comfortable clothing (specific to your surgery type) to the hospital.  After surgery, you can help prevent lung complications by doing breathing exercises.  Take deep breaths and cough every 1-2 hours. Your doctor may order a device called an Incentive Spirometer to help you take deep breaths. When coughing or sneezing, hold a pillow firmly against your incision with both hands. This is called "splinting." Doing this helps protect your incision. It also decreases belly discomfort.  If you are being admitted to the hospital overnight, leave your suitcase in the car. After surgery it may be brought to your room.  If you are being discharged the day of surgery, you will not be allowed to drive home. You will need a responsible adult (18 years or older) to drive you home and stay with you that night.   If you are taking public transportation, you will need to have a responsible adult (18 years  or older) with you. Please confirm with your physician that it is acceptable to use public transportation.   Please call the Pre-admissions Testing Dept. at [phone] if you have any questions about these instructions.  Surgery Visitation Policy:  Patients undergoing a surgery or procedure may have one family member or support person with them as long as that person is not  COVID-19 positive or experiencing its symptoms.  That person may remain in the waiting area during the procedure.  Inpatient Visitation:    Visiting hours are 7 a.m. to 8 p.m. Inpatients will be allowed two visitors daily. The visitors may change each day during the patient's stay. No visitors under the age of 70. Any visitor under the age of 32 must be accompanied by an adult. The visitor must pass COVID-19 screenings, use hand sanitizer when entering and exiting the patient's room and wear a mask at all times, including in the patient's room. Patients must also wear a mask when staff or their visitor are in the room. Masking is required regardless of vaccination status.

## 2020-12-29 ENCOUNTER — Encounter: Payer: Self-pay | Admitting: Vascular Surgery

## 2020-12-29 ENCOUNTER — Inpatient Hospital Stay: Payer: 59

## 2020-12-29 ENCOUNTER — Inpatient Hospital Stay: Payer: 59 | Admitting: Anesthesiology

## 2020-12-29 ENCOUNTER — Inpatient Hospital Stay
Admission: RE | Admit: 2020-12-29 | Discharge: 2020-12-31 | DRG: 253 | Disposition: A | Discharge status: Home / Self Care | Payer: 59 | Attending: Vascular Surgery | Admitting: Vascular Surgery

## 2020-12-29 ENCOUNTER — Encounter
Admission: RE | Disposition: A | Discharge status: Home / Self Care | Payer: Self-pay | Source: Home / Self Care | Attending: Vascular Surgery

## 2020-12-29 DIAGNOSIS — Z886 Allergy status to analgesic agent status: Secondary | ICD-10-CM | POA: Diagnosis not present

## 2020-12-29 DIAGNOSIS — Z8249 Family history of ischemic heart disease and other diseases of the circulatory system: Secondary | ICD-10-CM | POA: Diagnosis not present

## 2020-12-29 DIAGNOSIS — I743 Embolism and thrombosis of arteries of the lower extremities: Secondary | ICD-10-CM | POA: Diagnosis present

## 2020-12-29 DIAGNOSIS — Z20822 Contact with and (suspected) exposure to covid-19: Secondary | ICD-10-CM | POA: Diagnosis present

## 2020-12-29 DIAGNOSIS — I2510 Atherosclerotic heart disease of native coronary artery without angina pectoris: Secondary | ICD-10-CM | POA: Diagnosis present

## 2020-12-29 DIAGNOSIS — I10 Essential (primary) hypertension: Secondary | ICD-10-CM | POA: Diagnosis present

## 2020-12-29 DIAGNOSIS — Z833 Family history of diabetes mellitus: Secondary | ICD-10-CM

## 2020-12-29 DIAGNOSIS — R531 Weakness: Secondary | ICD-10-CM | POA: Diagnosis not present

## 2020-12-29 DIAGNOSIS — I70229 Atherosclerosis of native arteries of extremities with rest pain, unspecified extremity: Secondary | ICD-10-CM | POA: Diagnosis not present

## 2020-12-29 DIAGNOSIS — Z885 Allergy status to narcotic agent status: Secondary | ICD-10-CM | POA: Diagnosis not present

## 2020-12-29 DIAGNOSIS — I70212 Atherosclerosis of native arteries of extremities with intermittent claudication, left leg: Secondary | ICD-10-CM | POA: Diagnosis not present

## 2020-12-29 DIAGNOSIS — I714 Abdominal aortic aneurysm, without rupture: Secondary | ICD-10-CM | POA: Diagnosis present

## 2020-12-29 DIAGNOSIS — Z888 Allergy status to other drugs, medicaments and biological substances status: Secondary | ICD-10-CM | POA: Diagnosis not present

## 2020-12-29 DIAGNOSIS — Z95828 Presence of other vascular implants and grafts: Secondary | ICD-10-CM

## 2020-12-29 DIAGNOSIS — I70223 Atherosclerosis of native arteries of extremities with rest pain, bilateral legs: Principal | ICD-10-CM | POA: Diagnosis present

## 2020-12-29 DIAGNOSIS — J449 Chronic obstructive pulmonary disease, unspecified: Secondary | ICD-10-CM | POA: Diagnosis present

## 2020-12-29 DIAGNOSIS — Z832 Family history of diseases of the blood and blood-forming organs and certain disorders involving the immune mechanism: Secondary | ICD-10-CM

## 2020-12-29 DIAGNOSIS — Z01810 Encounter for preprocedural cardiovascular examination: Secondary | ICD-10-CM | POA: Diagnosis not present

## 2020-12-29 DIAGNOSIS — I70213 Atherosclerosis of native arteries of extremities with intermittent claudication, bilateral legs: Secondary | ICD-10-CM | POA: Diagnosis not present

## 2020-12-29 DIAGNOSIS — E7800 Pure hypercholesterolemia, unspecified: Secondary | ICD-10-CM | POA: Diagnosis not present

## 2020-12-29 HISTORY — PX: ENDARTERECTOMY FEMORAL: SHX5804

## 2020-12-29 LAB — CBC
HCT: 29.6 % — ABNORMAL LOW (ref 39.0–52.0)
HCT: 32.9 % — ABNORMAL LOW (ref 39.0–52.0)
Hemoglobin: 10.8 g/dL — ABNORMAL LOW (ref 13.0–17.0)
Hemoglobin: 11.9 g/dL — ABNORMAL LOW (ref 13.0–17.0)
MCH: 33.7 pg (ref 26.0–34.0)
MCH: 33.9 pg (ref 26.0–34.0)
MCHC: 36.2 g/dL — ABNORMAL HIGH (ref 30.0–36.0)
MCHC: 36.5 g/dL — ABNORMAL HIGH (ref 30.0–36.0)
MCV: 92.8 fL (ref 80.0–100.0)
MCV: 93.2 fL (ref 80.0–100.0)
Platelets: 240 10*3/uL (ref 150–400)
Platelets: 256 10*3/uL (ref 150–400)
RBC: 3.19 MIL/uL — ABNORMAL LOW (ref 4.22–5.81)
RBC: 3.53 MIL/uL — ABNORMAL LOW (ref 4.22–5.81)
RDW: 13.1 % (ref 11.5–15.5)
RDW: 13.1 % (ref 11.5–15.5)
WBC: 15.4 10*3/uL — ABNORMAL HIGH (ref 4.0–10.5)
WBC: 21 10*3/uL — ABNORMAL HIGH (ref 4.0–10.5)
nRBC: 0 % (ref 0.0–0.2)
nRBC: 0 % (ref 0.0–0.2)

## 2020-12-29 LAB — MRSA NEXT GEN BY PCR, NASAL: MRSA by PCR Next Gen: NOT DETECTED

## 2020-12-29 LAB — ABO/RH: ABO/RH(D): A POS

## 2020-12-29 LAB — CREATININE, SERUM
Creatinine, Ser: 0.72 mg/dL (ref 0.61–1.24)
GFR, Estimated: >60 mL/min (ref >60)

## 2020-12-29 LAB — GLUCOSE, CAPILLARY: Glucose-Capillary: 192 mg/dL — ABNORMAL HIGH (ref 70–99)

## 2020-12-29 SURGERY — ENDARTERECTOMY, FEMORAL
Anesthesia: General

## 2020-12-29 MED ORDER — ALBUMIN HUMAN 5 % IV SOLN
INTRAVENOUS | Status: AC
  Filled 2020-12-29: qty 500

## 2020-12-29 MED ORDER — MIDAZOLAM HCL 2 MG/2ML IJ SOLN
INTRAMUSCULAR | Status: AC
  Filled 2020-12-29: qty 2

## 2020-12-29 MED ORDER — DOCUSATE SODIUM 100 MG PO CAPS
100.0000 mg | ORAL_CAPSULE | Freq: Every day | ORAL | Status: DC
  Administered 2020-12-30 – 2020-12-31 (×2): 100 mg via ORAL
  Filled 2020-12-29 (×2): qty 1

## 2020-12-29 MED ORDER — LISINOPRIL-HYDROCHLOROTHIAZIDE 20-25 MG PO TABS: 1.0000 | ORAL_TABLET | Freq: Every day | ORAL | Status: DC

## 2020-12-29 MED ORDER — CEFAZOLIN SODIUM-DEXTROSE 2-4 GM/100ML-% IV SOLN
2.0000 g | INTRAVENOUS | Status: AC
  Administered 2020-12-29 (×2): 2 g via INTRAVENOUS

## 2020-12-29 MED ORDER — LABETALOL HCL 5 MG/ML IV SOLN: 10.0000 mg | INTRAVENOUS | Status: DC | PRN

## 2020-12-29 MED ORDER — ONDANSETRON HCL 4 MG/2ML IJ SOLN
INTRAMUSCULAR | Status: DC | PRN
  Administered 2020-12-29: 4 mg via INTRAVENOUS

## 2020-12-29 MED ORDER — VISTASEAL 10 ML SINGLE DOSE KIT
PACK | CUTANEOUS | Status: AC
  Filled 2020-12-29: qty 10

## 2020-12-29 MED ORDER — PROPOFOL 10 MG/ML IV BOLUS
INTRAVENOUS | Status: DC | PRN
  Administered 2020-12-29: 100 mg via INTRAVENOUS
  Administered 2020-12-29: 20 mg via INTRAVENOUS

## 2020-12-29 MED ORDER — DEXAMETHASONE SODIUM PHOSPHATE 10 MG/ML IJ SOLN
INTRAMUSCULAR | Status: DC | PRN
  Administered 2020-12-29: 5 mg via INTRAVENOUS

## 2020-12-29 MED ORDER — SODIUM CHLORIDE 0.9 % IV SOLN: INTRAVENOUS | Status: DC | PRN

## 2020-12-29 MED ORDER — HEPARIN SODIUM (PORCINE) 5000 UNIT/ML IJ SOLN
INTRAMUSCULAR | Status: AC
  Filled 2020-12-29: qty 1

## 2020-12-29 MED ORDER — PHENYLEPHRINE HCL (PRESSORS) 10 MG/ML IV SOLN
INTRAVENOUS | Status: AC
  Filled 2020-12-29: qty 1

## 2020-12-29 MED ORDER — ACETAMINOPHEN 650 MG RE SUPP: 325.0000 mg | RECTAL | Status: DC | PRN

## 2020-12-29 MED ORDER — ATORVASTATIN CALCIUM 10 MG PO TABS: 10.0000 mg | ORAL_TABLET | Freq: Every day | ORAL | Status: DC

## 2020-12-29 MED ORDER — HYDROCHLOROTHIAZIDE 25 MG PO TABS
25.0000 mg | ORAL_TABLET | Freq: Every day | ORAL | Status: DC
  Administered 2020-12-30 – 2020-12-31 (×2): 25 mg via ORAL
  Filled 2020-12-29 (×2): qty 1

## 2020-12-29 MED ORDER — METOPROLOL SUCCINATE ER 25 MG PO TB24
25.0000 mg | ORAL_TABLET | Freq: Every day | ORAL | Status: DC
  Administered 2020-12-30 – 2020-12-31 (×2): 25 mg via ORAL
  Filled 2020-12-29 (×2): qty 1

## 2020-12-29 MED ORDER — METOPROLOL TARTRATE 5 MG/5ML IV SOLN
INTRAVENOUS | Status: DC | PRN
  Administered 2020-12-29: 2.5 mg via INTRAVENOUS

## 2020-12-29 MED ORDER — ORAL CARE MOUTH RINSE: 15.0000 mL | Freq: Once | OROMUCOSAL | Status: AC

## 2020-12-29 MED ORDER — PHENYLEPHRINE HCL (PRESSORS) 10 MG/ML IV SOLN
INTRAVENOUS | Status: DC | PRN
  Administered 2020-12-29 (×2): 100 ug via INTRAVENOUS
  Administered 2020-12-29: 200 ug via INTRAVENOUS
  Administered 2020-12-29: 100 ug via INTRAVENOUS
  Administered 2020-12-29: 200 ug via INTRAVENOUS
  Administered 2020-12-29 (×2): 100 ug via INTRAVENOUS

## 2020-12-29 MED ORDER — HYDRALAZINE HCL 20 MG/ML IJ SOLN: 5.0000 mg | INTRAMUSCULAR | Status: DC | PRN

## 2020-12-29 MED ORDER — ROCURONIUM BROMIDE 100 MG/10ML IV SOLN: INTRAVENOUS | Status: DC | PRN

## 2020-12-29 MED ORDER — CHLORHEXIDINE GLUCONATE 0.12 % MT SOLN
OROMUCOSAL | Status: AC
  Administered 2020-12-29: 15 mL via OROMUCOSAL
  Filled 2020-12-29: qty 15

## 2020-12-29 MED ORDER — FENTANYL CITRATE (PF) 250 MCG/5ML IJ SOLN
INTRAMUSCULAR | Status: AC
  Filled 2020-12-29: qty 5

## 2020-12-29 MED ORDER — MAGNESIUM SULFATE 2 GM/50ML IV SOLN
2.0000 g | Freq: Every day | INTRAVENOUS | Status: DC | PRN
  Filled 2020-12-29: qty 50

## 2020-12-29 MED ORDER — FLUTICASONE FUROATE-VILANTEROL 200-25 MCG/INH IN AEPB
1.0000 | INHALATION_SPRAY | Freq: Every day | RESPIRATORY_TRACT | Status: DC
  Administered 2020-12-30 – 2020-12-31 (×2): 1 via RESPIRATORY_TRACT
  Filled 2020-12-29: qty 28

## 2020-12-29 MED ORDER — CHLORHEXIDINE GLUCONATE CLOTH 2 % EX PADS
6.0000 | MEDICATED_PAD | Freq: Once | CUTANEOUS | Status: AC
  Administered 2020-12-29: 6 via TOPICAL

## 2020-12-29 MED ORDER — FAMOTIDINE 20 MG PO TABS
ORAL_TABLET | ORAL | Status: AC
  Administered 2020-12-29: 20 mg via ORAL
  Filled 2020-12-29: qty 1

## 2020-12-29 MED ORDER — HEMOSTATIC AGENTS (NO CHARGE) OPTIME
TOPICAL | Status: DC | PRN
  Administered 2020-12-29: 3 via TOPICAL

## 2020-12-29 MED ORDER — MORPHINE SULFATE (PF) 2 MG/ML IV SOLN: 2.0000 mg | INTRAVENOUS | Status: DC | PRN

## 2020-12-29 MED ORDER — HEPARIN SODIUM (PORCINE) 1000 UNIT/ML IJ SOLN
INTRAMUSCULAR | Status: DC | PRN
  Administered 2020-12-29: 2000 [IU] via INTRAVENOUS
  Administered 2020-12-29: 1000 [IU] via INTRAVENOUS
  Administered 2020-12-29: 2000 [IU] via INTRAVENOUS
  Administered 2020-12-29: 6000 [IU] via INTRAVENOUS

## 2020-12-29 MED ORDER — SODIUM CHLORIDE 0.9 % IV SOLN: 500.0000 mL | Freq: Once | INTRAVENOUS | Status: DC | PRN

## 2020-12-29 MED ORDER — ACETAMINOPHEN 10 MG/ML IV SOLN
INTRAVENOUS | Status: DC | PRN
  Administered 2020-12-29: 1000 mg via INTRAVENOUS

## 2020-12-29 MED ORDER — NITROGLYCERIN IN D5W 200-5 MCG/ML-% IV SOLN: 5.0000 ug/min | INTRAVENOUS | Status: DC

## 2020-12-29 MED ORDER — SORBITOL 70 % SOLN
30.0000 mL | Freq: Every day | Status: DC | PRN
  Filled 2020-12-29: qty 30

## 2020-12-29 MED ORDER — ROCURONIUM BROMIDE 100 MG/10ML IV SOLN
INTRAVENOUS | Status: DC | PRN
  Administered 2020-12-29 (×2): 20 mg via INTRAVENOUS
  Administered 2020-12-29: 30 mg via INTRAVENOUS
  Administered 2020-12-29: 100 mg via INTRAVENOUS

## 2020-12-29 MED ORDER — CHLORHEXIDINE GLUCONATE CLOTH 2 % EX PADS
6.0000 | MEDICATED_PAD | Freq: Every day | CUTANEOUS | Status: DC
  Administered 2020-12-29 – 2020-12-30 (×2): 6 via TOPICAL

## 2020-12-29 MED ORDER — SODIUM CHLORIDE 0.9 % IV SOLN: INTRAVENOUS | Status: DC

## 2020-12-29 MED ORDER — ACETAMINOPHEN 325 MG PO TABS: 650.0000 mg | ORAL_TABLET | Freq: Four times a day (QID) | ORAL | Status: DC | PRN

## 2020-12-29 MED ORDER — DOPAMINE-DEXTROSE 3.2-5 MG/ML-% IV SOLN: 3.0000 ug/kg/min | INTRAVENOUS | Status: DC

## 2020-12-29 MED ORDER — ALUM & MAG HYDROXIDE-SIMETH 200-200-20 MG/5ML PO SUSP: 15.0000 mL | ORAL | Status: DC | PRN

## 2020-12-29 MED ORDER — METOPROLOL TARTRATE 5 MG/5ML IV SOLN: 2.0000 mg | INTRAVENOUS | Status: DC | PRN

## 2020-12-29 MED ORDER — ACETAMINOPHEN 325 MG PO TABS: 325.0000 mg | ORAL_TABLET | ORAL | Status: DC | PRN

## 2020-12-29 MED ORDER — SUGAMMADEX SODIUM 200 MG/2ML IV SOLN
INTRAVENOUS | Status: DC | PRN
  Administered 2020-12-29: 175 mg via INTRAVENOUS

## 2020-12-29 MED ORDER — SALINE SPRAY 0.65 % NA SOLN
1.0000 | Freq: Every day | NASAL | Status: DC | PRN
  Filled 2020-12-29: qty 44

## 2020-12-29 MED ORDER — AMLODIPINE BESYLATE 10 MG PO TABS
10.0000 mg | ORAL_TABLET | Freq: Every day | ORAL | Status: DC
  Administered 2020-12-30 – 2020-12-31 (×2): 10 mg via ORAL
  Filled 2020-12-29 (×2): qty 1

## 2020-12-29 MED ORDER — ASPIRIN EC 81 MG PO TBEC
81.0000 mg | DELAYED_RELEASE_TABLET | Freq: Every day | ORAL | Status: DC
  Administered 2020-12-29 – 2020-12-31 (×3): 81 mg via ORAL
  Filled 2020-12-29 (×3): qty 1

## 2020-12-29 MED ORDER — PROPOFOL 10 MG/ML IV BOLUS
INTRAVENOUS | Status: AC
  Filled 2020-12-29: qty 40

## 2020-12-29 MED ORDER — PRAVASTATIN SODIUM 20 MG PO TABS: 10.0000 mg | ORAL_TABLET | Freq: Every day | ORAL | Status: DC

## 2020-12-29 MED ORDER — PHENOL 1.4 % MT LIQD
1.0000 | OROMUCOSAL | Status: DC | PRN
  Filled 2020-12-29: qty 177

## 2020-12-29 MED ORDER — PHENYLEPHRINE HCL-NACL 20-0.9 MG/250ML-% IV SOLN
INTRAVENOUS | Status: DC | PRN
  Administered 2020-12-29: 40 ug/min via INTRAVENOUS

## 2020-12-29 MED ORDER — CLOPIDOGREL BISULFATE 75 MG PO TABS
75.0000 mg | ORAL_TABLET | Freq: Every day | ORAL | Status: DC
  Administered 2020-12-29 – 2020-12-31 (×3): 75 mg via ORAL
  Filled 2020-12-29 (×3): qty 1

## 2020-12-29 MED ORDER — ACETAMINOPHEN 10 MG/ML IV SOLN
INTRAVENOUS | Status: AC
  Filled 2020-12-29: qty 100

## 2020-12-29 MED ORDER — ATORVASTATIN CALCIUM 20 MG PO TABS
40.0000 mg | ORAL_TABLET | Freq: Every day | ORAL | Status: DC
  Administered 2020-12-29 – 2020-12-31 (×3): 40 mg via ORAL
  Filled 2020-12-29 (×3): qty 2

## 2020-12-29 MED ORDER — HYDROCODONE-ACETAMINOPHEN 5-325 MG PO TABS: 1.0000 | ORAL_TABLET | ORAL | Status: DC | PRN

## 2020-12-29 MED ORDER — HEPARIN SODIUM (PORCINE) 1000 UNIT/ML IJ SOLN
INTRAMUSCULAR | Status: AC
  Filled 2020-12-29: qty 1

## 2020-12-29 MED ORDER — OXYCODONE-ACETAMINOPHEN 5-325 MG PO TABS
1.0000 | ORAL_TABLET | ORAL | Status: DC | PRN
  Filled 2020-12-29: qty 2

## 2020-12-29 MED ORDER — STERILE WATER FOR IRRIGATION IR SOLN
Status: DC | PRN
  Administered 2020-12-29: 500 mL

## 2020-12-29 MED ORDER — FENTANYL CITRATE (PF) 100 MCG/2ML IJ SOLN: 25.0000 ug | INTRAMUSCULAR | Status: DC | PRN

## 2020-12-29 MED ORDER — FAMOTIDINE 20 MG PO TABS: 20.0000 mg | ORAL_TABLET | Freq: Once | ORAL | Status: AC

## 2020-12-29 MED ORDER — LIDOCAINE HCL (CARDIAC) PF 100 MG/5ML IV SOSY
PREFILLED_SYRINGE | INTRAVENOUS | Status: DC | PRN
  Administered 2020-12-29: 80 mg via INTRAVENOUS

## 2020-12-29 MED ORDER — SODIUM CHLORIDE 0.9 % IR SOLN
Status: DC | PRN
  Administered 2020-12-29: 1000 mL

## 2020-12-29 MED ORDER — LACTATED RINGERS IV SOLN: INTRAVENOUS | Status: DC

## 2020-12-29 MED ORDER — ONDANSETRON HCL 4 MG/2ML IJ SOLN: 4.0000 mg | Freq: Four times a day (QID) | INTRAMUSCULAR | Status: DC | PRN

## 2020-12-29 MED ORDER — ISOSORBIDE MONONITRATE ER 30 MG PO TB24
30.0000 mg | ORAL_TABLET | Freq: Every day | ORAL | Status: DC
  Administered 2020-12-29 – 2020-12-31 (×3): 30 mg via ORAL
  Filled 2020-12-29 (×3): qty 1

## 2020-12-29 MED ORDER — MOMETASONE FURO-FORMOTEROL FUM 200-5 MCG/ACT IN AERO: 2.0000 | INHALATION_SPRAY | Freq: Two times a day (BID) | RESPIRATORY_TRACT | Status: DC

## 2020-12-29 MED ORDER — ENOXAPARIN SODIUM 40 MG/0.4ML IJ SOSY
40.0000 mg | PREFILLED_SYRINGE | INTRAMUSCULAR | Status: DC
  Administered 2020-12-30: 40 mg via SUBCUTANEOUS
  Filled 2020-12-29 (×2): qty 0.4

## 2020-12-29 MED ORDER — CEFAZOLIN SODIUM-DEXTROSE 2-4 GM/100ML-% IV SOLN
INTRAVENOUS | Status: AC
  Filled 2020-12-29: qty 100

## 2020-12-29 MED ORDER — ROCURONIUM BROMIDE 10 MG/ML (PF) SYRINGE
PREFILLED_SYRINGE | INTRAVENOUS | Status: AC
  Filled 2020-12-29: qty 10

## 2020-12-29 MED ORDER — CHLORHEXIDINE GLUCONATE 0.12 % MT SOLN: 15.0000 mL | Freq: Once | OROMUCOSAL | Status: AC

## 2020-12-29 MED ORDER — VISTASEAL 10 ML SINGLE DOSE KIT
PACK | CUTANEOUS | Status: DC | PRN
  Administered 2020-12-29: 10 mL via TOPICAL

## 2020-12-29 MED ORDER — ASPIRIN EC 325 MG PO TBEC: 325.0000 mg | DELAYED_RELEASE_TABLET | Freq: Two times a day (BID) | ORAL | Status: DC

## 2020-12-29 MED ORDER — ALBUMIN HUMAN 5 % IV SOLN
INTRAVENOUS | Status: DC | PRN
Start: 2020-12-29 — End: 2020-12-29

## 2020-12-29 MED ORDER — LISINOPRIL 20 MG PO TABS
20.0000 mg | ORAL_TABLET | Freq: Every day | ORAL | Status: DC
  Administered 2020-12-30 – 2020-12-31 (×2): 20 mg via ORAL
  Filled 2020-12-29 (×2): qty 1

## 2020-12-29 MED ORDER — MIDAZOLAM HCL 2 MG/2ML IJ SOLN
INTRAMUSCULAR | Status: DC | PRN
  Administered 2020-12-29: 2 mg via INTRAVENOUS

## 2020-12-29 MED ORDER — NAPROXEN 500 MG PO TABS
500.0000 mg | ORAL_TABLET | Freq: Two times a day (BID) | ORAL | Status: DC
  Administered 2020-12-29 – 2020-12-31 (×3): 500 mg via ORAL
  Filled 2020-12-29 (×6): qty 1

## 2020-12-29 MED ORDER — SENNOSIDES-DOCUSATE SODIUM 8.6-50 MG PO TABS: 1.0000 | ORAL_TABLET | Freq: Every evening | ORAL | Status: DC | PRN

## 2020-12-29 MED ORDER — POTASSIUM CHLORIDE CRYS ER 20 MEQ PO TBCR: 20.0000 meq | EXTENDED_RELEASE_TABLET | Freq: Every day | ORAL | Status: DC | PRN

## 2020-12-29 MED ORDER — FENTANYL CITRATE (PF) 100 MCG/2ML IJ SOLN
INTRAMUSCULAR | Status: DC | PRN
  Administered 2020-12-29: 50 ug via INTRAVENOUS
  Administered 2020-12-29: 100 ug via INTRAVENOUS
  Administered 2020-12-29 (×2): 50 ug via INTRAVENOUS

## 2020-12-29 MED ORDER — GUAIFENESIN-DM 100-10 MG/5ML PO SYRP: 15.0000 mL | ORAL_SOLUTION | ORAL | Status: DC | PRN

## 2020-12-29 MED ORDER — CEFAZOLIN SODIUM-DEXTROSE 2-4 GM/100ML-% IV SOLN
2.0000 g | Freq: Three times a day (TID) | INTRAVENOUS | Status: AC
  Administered 2020-12-29 – 2020-12-30 (×2): 2 g via INTRAVENOUS
  Filled 2020-12-29 (×2): qty 100

## 2020-12-29 MED ORDER — FAMOTIDINE 20 MG IN NS 100 ML IVPB
20.0000 mg | Freq: Two times a day (BID) | INTRAVENOUS | Status: DC
  Administered 2020-12-29 (×2): 20 mg via INTRAVENOUS
  Filled 2020-12-29 (×2): qty 100

## 2020-12-29 SURGICAL SUPPLY — 85 items
BAG DECANTER FOR FLEXI CONT (MISCELLANEOUS) ×6 IMPLANT
BAG ISOLATATION DRAPE 20X20 ST (DRAPES) ×4 IMPLANT
BALLN LUTONIX 018 5X80X130 (BALLOONS) ×3
BALLN ULTRVRSE 018 2.5X100X150 (BALLOONS) ×3
BALLN ULTRVRSE 3X150X150 (BALLOONS) ×3
BALLOON LUTONIX 018 5X80X130 (BALLOONS) ×2 IMPLANT
BALLOON ULTRVRSE 3X150X150 (BALLOONS) ×2 IMPLANT
BALLOON ULTRVS 018 2.5X100X150 (BALLOONS) ×2 IMPLANT
BLADE CLIPPER SURG (BLADE) ×3 IMPLANT
BLADE SURG 15 STRL LF DISP TIS (BLADE) ×2 IMPLANT
BLADE SURG 15 STRL SS (BLADE) ×1
BLADE SURG SZ11 CARB STEEL (BLADE) ×3 IMPLANT
BOOT SUTURE AID YELLOW STND (SUTURE) ×6 IMPLANT
BRUSH SCRUB EZ  4% CHG (MISCELLANEOUS) ×2
BRUSH SCRUB EZ 4% CHG (MISCELLANEOUS) ×4 IMPLANT
CATH BEACON 5 .035 65 KMP TIP (CATHETERS) ×3 IMPLANT
CATH EMBOLECTOMY 3X80 (CATHETERS) ×3 IMPLANT
CATH FOGERTY 4X80 WAS (CATHETERS) ×3 IMPLANT
CHLORAPREP W/TINT 26 (MISCELLANEOUS) ×6 IMPLANT
COUNTER NEEDLE 20/40 LG (NEEDLE) ×6 IMPLANT
DERMABOND ADVANCED (GAUZE/BANDAGES/DRESSINGS) ×2
DERMABOND ADVANCED .7 DNX12 (GAUZE/BANDAGES/DRESSINGS) ×4 IMPLANT
DRAPE INCISE IOBAN 66X45 STRL (DRAPES) ×3 IMPLANT
DRAPE ISOLATE BAG 20X20 STRL (DRAPES) ×2
DRESSING SURGICEL FIBRLLR 1X2 (HEMOSTASIS) ×6 IMPLANT
DRSG OPSITE POSTOP 4X6 (GAUZE/BANDAGES/DRESSINGS) IMPLANT
DRSG OPSITE POSTOP 4X8 (GAUZE/BANDAGES/DRESSINGS) ×6 IMPLANT
DRSG SURGICEL FIBRILLAR 1X2 (HEMOSTASIS) ×9
ELECT CAUTERY BLADE 6.4 (BLADE) ×6 IMPLANT
ELECT REM PT RETURN 9FT ADLT (ELECTROSURGICAL) ×6
ELECTRODE REM PT RTRN 9FT ADLT (ELECTROSURGICAL) ×4 IMPLANT
GLIDEWIRE ADV .035X180CM (WIRE) ×3 IMPLANT
GLOVE SURG SYN 7.0 (GLOVE) ×6 IMPLANT
GLOVE SURG UNDER LTX SZ7.5 (GLOVE) ×3 IMPLANT
GOWN STRL REUS W/ TWL LRG LVL3 (GOWN DISPOSABLE) ×2 IMPLANT
GOWN STRL REUS W/ TWL XL LVL3 (GOWN DISPOSABLE) ×4 IMPLANT
GOWN STRL REUS W/TWL LRG LVL3 (GOWN DISPOSABLE) ×1
GOWN STRL REUS W/TWL XL LVL3 (GOWN DISPOSABLE) ×2
HEMOSTAT SURGICEL 2X3 (HEMOSTASIS) ×3 IMPLANT
IV NS 1000ML (IV SOLUTION) ×1
IV NS 1000ML BAXH (IV SOLUTION) ×2 IMPLANT
IV NS 500ML (IV SOLUTION) ×1
IV NS 500ML BAXH (IV SOLUTION) ×2 IMPLANT
KIT ENCORE 26 ADVANTAGE (KITS) ×3 IMPLANT
KIT TURNOVER KIT A (KITS) ×3 IMPLANT
LABEL OR SOLS (LABEL) ×3 IMPLANT
LOOP RED MAXI  1X406MM (MISCELLANEOUS) ×3
LOOP VESSEL MAXI 1X406 RED (MISCELLANEOUS) ×6 IMPLANT
LOOP VESSEL MINI 0.8X406 BLUE (MISCELLANEOUS) ×4 IMPLANT
LOOPS BLUE MINI 0.8X406MM (MISCELLANEOUS) ×2
MANIFOLD NEPTUNE II (INSTRUMENTS) ×3 IMPLANT
NDL SAFETY ECLIPSE 18X1.5 (NEEDLE) ×2 IMPLANT
NEEDLE HYPO 18GX1.5 SHARP (NEEDLE) ×1
PACK ANGIOGRAPHY (CUSTOM PROCEDURE TRAY) ×3 IMPLANT
PACK BASIN MAJOR ARMC (MISCELLANEOUS) ×3 IMPLANT
PACK UNIVERSAL (MISCELLANEOUS) ×3 IMPLANT
PATCH CAROTID ECM VASC 1X10 (Prosthesis & Implant Heart) ×9 IMPLANT
PENCIL ELECTRO HAND CTR (MISCELLANEOUS) ×6 IMPLANT
SET WALTER ACTIVATION W/DRAPE (SET/KITS/TRAYS/PACK) ×3 IMPLANT
SHEATH BRITE TIP 6FRX11 (SHEATH) ×3 IMPLANT
SPONGE T-LAP 18X18 ~~LOC~~+RFID (SPONGE) ×9 IMPLANT
SUT MNCRL 4-0 (SUTURE) ×1
SUT MNCRL 4-0 27XMFL (SUTURE) ×2
SUT PROLENE 5 0 RB 1 DA (SUTURE) ×24 IMPLANT
SUT PROLENE 6 0 BV (SUTURE) ×72 IMPLANT
SUT PROLENE 7 0 BV 1 (SUTURE) ×18 IMPLANT
SUT SILK 2 0 (SUTURE) ×2
SUT SILK 2-0 18XBRD TIE 12 (SUTURE) ×4 IMPLANT
SUT SILK 3 0 (SUTURE) ×1
SUT SILK 3-0 18XBRD TIE 12 (SUTURE) ×2 IMPLANT
SUT SILK 4 0 (SUTURE) ×1
SUT SILK 4-0 18XBRD TIE 12 (SUTURE) ×2 IMPLANT
SUT VIC AB 2-0 CT1 27 (SUTURE) ×4
SUT VIC AB 2-0 CT1 TAPERPNT 27 (SUTURE) ×8 IMPLANT
SUT VIC AB 3-0 SH 27 (SUTURE) ×1
SUT VIC AB 3-0 SH 27X BRD (SUTURE) ×2 IMPLANT
SUT VICRYL+ 3-0 36IN CT-1 (SUTURE) ×12 IMPLANT
SUTURE MNCRL 4-0 27XMF (SUTURE) ×2 IMPLANT
SYR 20ML LL LF (SYRINGE) ×3 IMPLANT
SYR 3ML LL SCALE MARK (SYRINGE) ×3 IMPLANT
SYR 5ML LL (SYRINGE) ×3 IMPLANT
SYR BULB IRRIG 60ML STRL (SYRINGE) ×3 IMPLANT
TRAY FOLEY MTR SLVR 16FR STAT (SET/KITS/TRAYS/PACK) ×3 IMPLANT
WATER STERILE IRR 500ML POUR (IV SOLUTION) ×3 IMPLANT
WIRE G V18X300CM (WIRE) ×3 IMPLANT

## 2020-12-29 NOTE — Op Note (Signed)
OPERATIVE NOTE   PROCEDURE: 1.   Left common femoral, profunda femoris, and superficial femoral artery endarterectomies 2.   Right common femoral, profunda femoris, and superficial femoral artery endarterectomies 3.   Separate left profunda femoris endarterectomy with separate arteriotomy and separate patch angioplasty after initial eversion endarterectomy imaging showed significant residual disease beyond the eversion endarterectomy 4.   Left lower extremity angiogram 5.  Angioplasty of the left popliteal artery with 5 mm diameter Lutonix drug-coated angioplasty balloon 6.  Percutaneous transluminal angioplasty of left posterior tibial artery and tibioperoneal trunk with 3 mm diameter angioplasty balloon 7.  Percutaneous transluminal angioplasty of left anterior tibial artery with 2.5 mm diameter angioplasty balloon   PRE-OPERATIVE DIAGNOSIS: 1.Atherosclerotic occlusive disease bilateral lower extremities with rest pain   POST-OPERATIVE DIAGNOSIS: Same  SURGEON: Leotis Pain, MD  CO-surgeon: Hortencia Pilar, MD  ANESTHESIA:  general  ESTIMATED BLOOD LOSS: 900 cc  FINDING(S): 1.  significant plaque in bilateral common femoral, profunda femoris, and superficial femoral arteries 2.  60 to 70% stenosis in the left popliteal artery, nearly occlusive stenosis in the tibioperoneal trunk and the proximal portion of the posterior tibial artery, 80% stenosis of the proximal portion of the left anterior tibial artery  SPECIMEN(S):  Bilateral common femoral, profunda femoris, and superficial femoral artery plaque.  INDICATIONS:    Patient presents with severe peripheral arterial disease worse on the left than the right now with clear rest pain.  CT scan showed severe bilateral common femoral and bifurcation disease as well as occlusion of the previous left SFA stents.  Bilateral femoral endarterectomies are planned to try to improve perfusion as well as endoluminal therapy for more distal disease  on the left.  The risks and benefits as well as alternative therapies including intervention were reviewed in detail all questions were answered the patient agrees to proceed with surgery.  DESCRIPTION: After obtaining full informed written consent, the patient was brought back to the operating room and placed supine upon the operating table.  The patient received IV antibiotics prior to induction.  After obtaining adequate anesthesia, the patient was prepped and draped in the standard fashion appropriate time out is called.    With myself working on the left and Dr. Delana Meyer working on the right we began by dissecting out the femoral arteries on each side. Vertical incisions were created overlying both femoral arteries. The common femoral artery proximally, and superficial femoral artery, and primary profunda femoris artery branches were encircled with vessel loops and prepared for control. Both femoral arteries were found to have significant plaque from the common femoral artery into the profunda and superficial femoral arteries.    6000 units of heparin was given and allowed circulate for 5 minutes.  Additional heparin was given later in the procedure as needed  Attention is then turned to the right femoral artery.  An arteriotomy is made with 11 blade and extended with Potts scissors in the common femoral artery and carried down onto the first 3-4 cm of the superficial femoral artery. An endarterectomy was then performed. [facility] was used to create a plane. The proximal endpoint was cut flush with tenotomy scissors. This was in the proximal common femoral artery.  This was tacked down with two 7-0 Prolene sutures.  An eversion endarterectomy was then performed for the first 2-3 cm of the right profunda femoris artery. Good backbleeding was then seen.  the distal endpoint of the superficial femoral artery endarterectomy was created with gentle traction and the  distal endpoint was cut with  tenotomy scissors well out the superficial femoral artery at the level of the arteriotomy.  This was then tacked down with a total of four 7-0 Prolene sutures.  The Cormatrix patcth is then selected and prepared for a patch angioplasty.  It is cut and beveled and started at the proximal endpoint with a 5-0 Prolene suture.  Approximately one half of the suture line is run medially and laterally and the distal end point was cut and bevelled to match the arteriotomy.  A second 6-0 Prolene was started at the distal end point and run to the mid portion to complete the arteriotomy.  The vessel was flushed prior to release of control and completion of the anastomosis.  At this point, flow was established first to the profunda femoris artery and then to the superficial femoral artery. Easily palpable pulses are noted well beyond the anastomosis and both arteries.  The left femoral artery is then addressed. Arteriotomy is made in the common femoral artery and extended down into the most proximal portion of the superficial femoral artery down to the previously placed stent. Similarly, an endarterectomy was performed with the [facility]. The proximal endpoint was cut flush with tenotomy scissors in the proximal common femoral artery.  The profunda femoris artery was addressed and treated with an eversion endarterectomy and this was performed with a hemostat and gentle traction. The arteriotomy was carried down onto the superficial femoral artery and the endarterectomy was continued to this point.  There was clear thrombus within the SFA stents.  We then passed 3 and 4 Fogarty embolectomy balloons with a total of 3 times down the left SFA and popliteal arteries getting down to about 45 to 50 cm.  A large amount of thrombus was returned and there was return of backbleeding.  The last past did not return any thrombus.  The distal endpoint was the proximal portion of the previously placed stent and this was tacked down  with a total of four 6-0 Prolene sutures. The Cormatrix extracellular patch was then brought onto the field.  It is cut and beveled and started at the proximal endpoint with a 6-0 Prolene suture.  Approximately one half of the suture line is run medially and laterally and the distal end point was cut and bevelled to match the arteriotomy.  A second 6-0 Prolene was started at the distal end point and run to the mid portion to complete the arteriotomy.   Flushing maneuvers were performed and flow was reestablished to the femoral vessels. Excellent pulses noted after completion of the anastomosis. I then accessed the patch on the left side with a Seldinger needle and a 6 French sheath was placed.  Imaging was performed.  This demonstrated the SFA and popliteal stents that had been occluded had been opened with embolectomy.  There was a moderate stenosis in the popliteal artery in the 60 to 70% range at the midportion well below the previously placed stents.  There was significant tibial disease.  The posterior tibial artery and tibioperoneal trunk had a high-grade stenosis of 90% or so.  There was about an 80% stenosis in the proximal portion of the anterior tibial artery.  These vessels were then continuous distally after the proximal stenoses.  Using a V 18 wire, across these stenoses in the popliteal artery and the posterior tibial artery without difficulty.  The popliteal lesion was addressed with a 5 mm diameter by 8 cm length Lutonix drug-coated angioplasty balloon  inflated to 10 atm for 1 minute.  Completion imaging showed only about a 10% residual stenosis.  The posterior tibial artery and tibioperoneal trunk were then addressed with a 3 mm diameter by 15 cm length angioplasty balloon inflated to 8 atm for 1 minute.  Completion imaging showed only about a 15% residual stenosis after angioplasty in the tibioperoneal trunk and posterior tibial artery.  I then used the Kumpe catheter and the V 18 wire to  cannulate the anterior tibial artery which was diseased as well.  I was able to cross the stenosis without difficulty.  Angioplasty was then performed with a 2.5 mm diameter by 10 cm length angioplasty balloon inflated to 10 atm for 1 minute.  A 10 to 15% residual stenosis was seen after angioplasty.  All of her interventions were markedly improved, but imaging up to evaluate the profunda femoris artery showed severe disease beyond the eversion endarterectomy with minimal flow in the profunda femoris artery.  This would need to be addressed surgically.  The 6 French sheath was removed and a 6-0 Prolene pursestring suture was placed in the patch. We then turned our attention to the left profunda femoris artery for a more distal endarterectomy.  Profunda clamp was placed proximally.  We controlled the 3 primary branches of the profunda femoris artery well down as far as could be dissected out safely.  We then created an anterior wall arteriotomy with 11 blade and extended this with Potts scissors well down onto one of the primary branches of the profunda femoris artery where the disease seems to lessen significantly.  An extensive endarterectomy was performed several centimeters out the profunda femoris artery into the primary branches separate and distinct from the original endarterectomy site.  This was done with a freer elevator.  The distal endpoint was tacked down with 7-0 Prolene sutures.  A separate CorMatrix patch was then brought on the field and cut and beveled to match the arteriotomy.  It was started at the distal endpoint as this was the more difficult side and was in a smaller branch of the profunda femoris artery.  It was around one half the length the arteriotomy and then cut and beveled to match the arteriotomy.  A second 6-0 Prolene was started at the proximal endpoint and run to the midportion.  The vessel was flushed and de-aired and following flushing maneuvers the anastomosis completed.   Excellent pulse was then palpable in the profunda femoris branches following this.   Surgicel and Evicel topical hemostatic agents were placed in the femoral incisions and hemostasis was complete. The femoral incisions were then closed in a layered fashion with 2 layers of 2-0 Vicryl, 2 layers of 3-0 Vicryl, and 4-0 Monocryl for the skin closure. Dermabond and sterile dressing were then placed over all incisions.  The patient was then awakened from anesthesia and taken to the recovery room in stable condition having tolerated the procedure well.  COMPLICATIONS: None  CONDITION: Stable     Leotis Pain 12/29/2020 12:56 PM  This note was created with Dragon Medical transcription system. Any errors in dictation are purely unintentional.

## 2020-12-29 NOTE — Transfer of Care (Signed)
Immediate Anesthesia Transfer of Care Note  Patient: RIP HAWES  Procedure(s) Performed: ENDARTERECTOMY FEMORAL (Bilateral) APPLICATION OF CELL SAVER  Patient Location: PACU  Anesthesia Type:General  Level of Consciousness: awake, alert  and oriented  Airway & Oxygen Therapy: Patient Spontanous Breathing  Post-op Assessment: Report given to RN and Post -op Vital signs reviewed and stable  Post vital signs: Reviewed and stable  Last Vitals:  Vitals Value Taken Time  BP    Temp    Pulse    Resp    SpO2      Last Pain:  Vitals:   12/29/20 0635  PainSc: 0-No pain         Complications: No notable events documented.

## 2020-12-29 NOTE — Anesthesia Procedure Notes (Signed)
Procedure Name: Intubation Date/Time: 12/29/2020 7:38 AM Performed by: Clyde Lundborg, CRNA Pre-anesthesia Checklist: Patient identified, Emergency Drugs available, Suction available and Patient being monitored Patient Re-evaluated:Patient Re-evaluated prior to induction Oxygen Delivery Method: Circle system utilized Preoxygenation: Pre-oxygenation with 100% oxygen Induction Type: IV induction Ventilation: Mask ventilation without difficulty Laryngoscope Size: McGraph and 3 Tube type: Oral Tube size: 7.5 mm Number of attempts: 1 Airway Equipment and Method: Stylet Placement Confirmation: ETT inserted through vocal cords under direct vision, positive ETCO2, breath sounds checked- equal and bilateral and CO2 detector Secured at: 22 cm Tube secured with: Tape Dental Injury: Teeth and Oropharynx as per pre-operative assessment

## 2020-12-29 NOTE — Op Note (Signed)
OPERATIVE NOTE   PROCEDURE: Bilateral common femoral, superficial femoral and profunda femoris endarterectomy with Cormatrix patch angioplasty. Additional left profunda femoris endarterectomy with separate and distinct CorMatrix patch angioplasty. Fogarty thrombectomy of the left SFA and popliteal.   Introduction catheter into left lower extremity third order catheter placement with additional third order. Open angioplasty to 3 mm left posterior tibial and tibioperoneal trunk. Open angioplasty to 2.5 mm left anterior tibial artery Open angioplasty to 5 mm left popliteal artery with a Lutonix drug-eluting balloon  PRE-OPERATIVE DIAGNOSIS: Atherosclerotic occlusive disease bilateral lower extremities with lifestyle limiting claudication bilaterally and rest pain of the left lower extremity; hypertension; hyperlipidemia  POST-OPERATIVE DIAGNOSIS: Same  CO-SURGEON: Renford Dills, MD and Annice Needy, M.D.  ASSISTANT(S): None  ANESTHESIA: general  ESTIMATED BLOOD LOSS: 900 cc  FINDING(S): Profound calcific plaque noted bilaterally extending past the initial bifurcation of the profunda femoris arteries as well as down the extensive length of the SFA.  Additional plaque from the left profunda femoris.  Thrombus from the left superficial femoral artery and popliteal arteries.  SPECIMEN(S):  Calcific plaque from the common femoral, superficial femoral and the profunda femoris arteries bilaterally; additional plaque from the left profunda femoris; thrombotic material retrieved from the left lower extremity  INDICATIONS:   Scott [name] 60 y.o. y.o.male who presents with complaints of lifestyle limiting claudication and pain continuously in the feet bilaterally. The patient has documented severe atherosclerotic occlusive disease and has undergone multiple minimally invasive treatments in the past. However, at this point his primary area of stricture stenosis resides in the common  femoral and origins of the superficial femoral and profunda femoris extending into these arteries and therefore this is not amenable to intervention and he is now undergoing open endarterectomy. The risks and benefits of been reviewed with the patient, all questions have answered; alternative therapies have been reviewed as well and the patient has agreed to proceed with surgical open repair.  DESCRIPTION: After obtaining full informed written consent, the patient was brought back to the operating room and placed supine upon the operating table.  The patient received IV antibiotics prior to induction.  After obtaining adequate anesthesia, the patient was prepped and draped in the standard fashion for: bilateral femoral exposure.  Co-surgeons are required because this is a bilateral procedure with work being performed simultaneously from both the right femoral and left femoral approach.  This also expedite the procedure making a shorter operative time reducing complications and improving patient safety.  Attention was turned to the bilateral groin with Dr. Wyn Quaker working on the patient's left and myself working on the right of the patient.  Vertical  incisions were made over the common femoral artery and dissected down to the common femoral artery with electrocautery.  I dissected out the common femoral artery from the distal external iliac artery (identified by the superficial circumflex vessels) down to the femoral bifurcation.  On initial inspection, the common femoral artery was: Densely calcified and there was no palpable pulse noted bilaterally.    Subsequently the dissection was continued to include all circumflex branches and the profunda femoral artery and superficial femoral artery. The superficial femoral artery was dissected circumferentially for a distance of approximately 3-4 cm and the profunda femoris was dissected circumferentially out to the fourth order branches on the left and the second-order  branches on the right.  Individual vessel loops were placed around each branch. Both of the groins were treated in the same fashion as  described above. Control of all branches was obtained with vessel loops.  A softer area in the distal external iliac artery amendable to clamping was identified.    The patient was given 5000 units of Heparin intravenously (2 additional 2000 unit boluses of heparin were given throughout the procedure), which was a therapeutic bolus.   After waiting 3 minutes, the right distal external iliac artery was clamped and placed all circumflex branches, and the right profunda and right superficial femoral arteries under tension.  Arteriotomy was made in the common femoral artery with a 11-blade and extended it with a Potts scissor proximally and distally extending the distal end down the SFA for approximately 3 cm.   Endarterectomy was then performed under direct visualization using a freer elevator and a right angle from the mid common femoral extending up both proximally and distally. Proximally the endarterectomy was brought up to the level of the clamp where a clean edge was obtained. Distally the endarterectomy was carried down to a soft spot in the SFA where a feathered edge would was obtained.  7-0 Prolene interrupted tacking sutures were placed to secure the leading edge of the plaque in the SFA.  The profunda femoris was treated with an eversion technique extending endarterectomy approximately 2 cm distally again obtaining a featheredge on the right side.   At this point, I fashioned a core matrix patch for the geometry of the arteriotomy.  The patch was sewn to the artery with 2 running stitches of 6-0 Prolene, running from each end.  Prior to completing the patch angioplasty, the profunda femoral artery was flushed as was the superficial femoral artery. The system was then forward flushed. The endarterectomy site was then irrigated copiously with heparinized saline. The  patch angioplasty was completed in the usual fashion.  Flow was then reestablished first to the profunda femoris and then the superficial femoral artery. Any gaps or bleeding sites in the suture line were easily controlled with a 6-0 Prolene suture.   Attention was then turned to the left femoral artery.  The left distal external iliac artery was clamped and placed all circumflex branches, and the left profunda and left superficial femoral arteries under tension.  Arteriotomy was made in the common femoral artery with a 11-blade and extended it with a Potts scissor proximally and distally extending the distal end down the SFA for approximately 3 cm.  Previously noted stent was then dissected and then tacked using 7-0 Prolene sutures to the SFA wall.  Endarterectomy was then performed under direct visualization using a freer elevator and a right angle from the mid common femoral extending up both proximally and distally. Proximally the endarterectomy was brought up to the level of the clamp where a clean edge was obtained. Distally the endarterectomy was carried down to a soft spot in the SFA where a feathered edge would was obtained.  7-0 Prolene interrupted tacking sutures were placed to secure the leading edge of the plaque in the SFA.  The profunda femoris was treated with an eversion technique extending endarterectomy approximately 2 cm distally again obtaining a featheredge on the left.  A total of four 7-0 Prolene sutures were used to tack the leading intimal edge of the profunda femoris.  Attention was then turned to performing thrombectomy of the SFA and popliteal on the left.  Given that we had exposed and cleared the origin of the stent we had access to the true lumen.  Also his most recent noninvasive studies which were  just a few months ago showed that his SFA was open raising concerns that this abrupt change in his vascular status would have significant thrombus as a component to it.  Therefore  initially we used a #3 Fogarty catheter but the balloon popped on this 1 and therefore we used a Psychologist, prison and probation services.  2 passes were made.  The first pass retrieved what appeared to be 30+ centimeters of soft thrombus.  Fairly good backbleeding ensued after this passed.  The second pass did not reach free of any further thrombotic material.  The SFA was then forward flushed with heparinized saline.  The SFA was then controlled with a vascular clamp.  At this point, I fashioned a core matrix patch for the geometry of the arteriotomy.  The patch was sewn to the artery with 2 running stitches of 6-0 Prolene, running from each end.  Prior to completing the patch angioplasty, the profunda femoral artery was flushed as was the superficial femoral artery. The system was then forward flushed. The endarterectomy site was then irrigated copiously with heparinized saline. The patch angioplasty was completed in the usual fashion.  Flow was then reestablished first to the profunda femoris and then the superficial femoral artery. Any gaps or bleeding sites in the suture line were easily controlled with a 6-0 Prolene suture.  With Dr. Wyn Quaker performing the intervention and myself assisting with wire control tension on the catheters as well as control of the sheath given that it was an open situation the details will be dictated by Dr. Wyn Quaker.  In brief and advantage wire was a advanced in combination with a Kumpe catheter.  Imaging demonstrated a greater than 80% mid popliteal lesion as well as a greater than 90%/subtotal occlusion of the tibioperoneal trunk and proximal posterior tibial.  Anterior tibial was patent in its origin for a centimeter or so and then there was a lesion greater than 90% that spanned a distance of 5 to 6 cm.  The tibioperoneal trunk and posterior tibial lesion were treated with a 3 mm balloon.  The anterior tibial was treated with a 2.5 mm balloon and the popliteal lesion was treated with a 5 mm Lutonix drug-eluting  balloon.  Follow-up imaging after the the angioplasties demonstrated less than 5% residual stenosis.  The SFA popliteal tibioperoneal trunk and posterior tibial as well as the anterior tibial are now patent with two-vessel runoff to the foot.  Both right and left groins were then irrigated copiously with sterile saline and subsequently Evicel and Surgicel were placed in the wound. The incision was repaired with a double layer of 2-0 Vicryl, a double layer of 3-0 Vicryl, and a layer of 4-0 Monocryl in a subcuticular fashion.  The skin was cleaned, dried, and reinforced with Dermabond.  COMPLICATIONS: None  CONDITION: Almon Register, M.D.  [facility] Office: [phone]  12/29/2020, 2:47 PM

## 2020-12-29 NOTE — Interval H&P Note (Signed)
History and Physical Interval Note:  12/29/2020 7:27 AM  Scott [name]  has presented today for surgery, with the diagnosis of I70.212 ASO with Claudication.  The various methods of treatment have been discussed with the patient and family. After consideration of risks, benefits and other options for treatment, the patient has consented to  Procedure(s) with comments: ENDARTERECTOMY FEMORAL (Bilateral) - Left SFA and Tibial intervention APPLICATION OF CELL SAVER (N/A) as a surgical intervention.  [name] history has been reviewed, patient examined, no change in status, stable for surgery.  I have reviewed the patient's chart and labs.  Questions were answered to the patient's satisfaction.     Festus Barren

## 2020-12-29 NOTE — Anesthesia Preprocedure Evaluation (Signed)
Anesthesia Evaluation  Patient identified by MRN, date of birth, ID band Patient awake    Reviewed: Allergy & Precautions, NPO status , Patient's Chart, lab work & pertinent test results  History of Anesthesia Complications Negative for: history of anesthetic complications  Airway Mallampati: III  TM Distance: >3 FB Neck ROM: full    Dental  (+) Chipped   Pulmonary shortness of breath and with exertion, COPD, Current Smoker and Patient abstained from smoking.,    Pulmonary exam normal        Cardiovascular Exercise Tolerance: Good hypertension, (-) angina+ CAD and + Peripheral Vascular Disease  (-) Past MI and (-) DOE Normal cardiovascular exam+ dysrhythmias      Neuro/Psych PSYCHIATRIC DISORDERS negative neurological ROS     GI/Hepatic negative GI ROS, Neg liver ROS, neg GERD  ,  Endo/Other  negative endocrine ROS  Renal/GU      Musculoskeletal  (+) Arthritis ,   Abdominal   Peds  Hematology negative hematology ROS (+)   Anesthesia Other Findings Past Medical History: No date: Anxiety No date: Arthritis No date: COPD (chronic obstructive pulmonary disease) (HCC) No date: Coronary artery disease 2022: COVID-19 No date: Depression No date: Dyspnea No date: Dysrhythmia No date: Hypertension No date: Pre-diabetes  Past Surgical History: No date: CHOLECYSTECTOMY 2000: CHOLECYSTECTOMY 06/03/2017: LEFT HEART CATH AND CORONARY ANGIOGRAPHY; Left     Comment:  Procedure: LEFT HEART CATH AND CORONARY ANGIOGRAPHY;                Surgeon: Alwyn Pea, MD;  Location: [facility] INVASIVE              CV LAB;  Service: Cardiovascular;  Laterality: Left; 02/03/2020: LOWER EXTREMITY ANGIOGRAPHY; Left     Comment:  Procedure: LOWER EXTREMITY ANGIOGRAPHY;  Surgeon: Annice Needy, MD;  Location: [facility] INVASIVE CV LAB;  Service:               Cardiovascular;  Laterality: Left; No date: TONSILLECTOMY  BMI     Body Mass Index: 24.39 kg/m      Reproductive/Obstetrics negative OB ROS                             Anesthesia Physical Anesthesia Plan  ASA: 3  Anesthesia Plan: General ETT   Post-op Pain Management:    Induction: Intravenous  PONV Risk Score and Plan: Ondansetron, Dexamethasone, Midazolam and Treatment may vary due to age or medical condition  Airway Management Planned: Oral ETT  Additional Equipment:   Intra-op Plan:   Post-operative Plan: Extubation in OR  Informed Consent: I have reviewed the patients History and Physical, chart, labs and discussed the procedure including the risks, benefits and alternatives for the proposed anesthesia with the patient or authorized representative who has indicated his/her understanding and acceptance.     Dental Advisory Given  Plan Discussed with: Anesthesiologist, CRNA and Surgeon  Anesthesia Plan Comments: (Patient consented for risks of anesthesia including but not limited to:  - adverse reactions to medications - damage to eyes, teeth, lips or other oral mucosa - nerve damage due to positioning  - sore throat or hoarseness - Damage to heart, brain, nerves, lungs, other parts of body or loss of life  Patient voiced understanding.)        Anesthesia Quick Evaluation

## 2020-12-29 NOTE — Anesthesia Postprocedure Evaluation (Signed)
Anesthesia Post Note  Patient: Scott [name]  Procedure(s) Performed: ENDARTERECTOMY FEMORAL (Bilateral) APPLICATION OF CELL SAVER  Patient location during evaluation: PACU Anesthesia Type: General Level of consciousness: awake and alert and oriented Pain management: pain level controlled Vital Signs Assessment: post-procedure vital signs reviewed and stable Respiratory status: spontaneous breathing, nonlabored ventilation and respiratory function stable Cardiovascular status: blood pressure returned to baseline and stable Postop Assessment: no signs of nausea or vomiting Anesthetic complications: no   No notable events documented.   Last Vitals:  Vitals:   12/29/20 1433 12/29/20 1500  BP: (!) 115/58 99/71  Pulse: 96 95  Resp: 18 12  Temp: 36.8 C   SpO2: 93% 94%    Last Pain:  Vitals:   12/29/20 1433  TempSrc: Oral  PainSc:                  Ladene Allocca

## 2020-12-30 DIAGNOSIS — Z0000 Encounter for general adult medical examination without abnormal findings: Secondary | ICD-10-CM

## 2020-12-30 LAB — BASIC METABOLIC PANEL
Anion gap: 7 (ref 5–15)
BUN: 13 mg/dL (ref 6–20)
CO2: 26 mmol/L (ref 22–32)
Calcium: 7.7 mg/dL — ABNORMAL LOW (ref 8.9–10.3)
Chloride: 96 mmol/L — ABNORMAL LOW (ref 98–111)
Creatinine, Ser: 0.65 mg/dL (ref 0.61–1.24)
GFR, Estimated: >60 mL/min (ref >60)
Glucose, Bld: 130 mg/dL — ABNORMAL HIGH (ref 70–99)
Potassium: 4.1 mmol/L (ref 3.5–5.1)
Sodium: 129 mmol/L — ABNORMAL LOW (ref 135–145)

## 2020-12-30 MED ORDER — HYDROMORPHONE HCL 1 MG/ML IJ SOLN: 1.0000 mg | Freq: Once | INTRAMUSCULAR | Status: DC | PRN

## 2020-12-30 MED ORDER — FAMOTIDINE 20 MG PO TABS
20.0000 mg | ORAL_TABLET | Freq: Two times a day (BID) | ORAL | Status: DC
  Administered 2020-12-30 – 2020-12-31 (×3): 20 mg via ORAL
  Filled 2020-12-30 (×3): qty 1

## 2020-12-30 MED ORDER — ONDANSETRON HCL 4 MG/2ML IJ SOLN: 4.0000 mg | Freq: Four times a day (QID) | INTRAMUSCULAR | Status: DC | PRN

## 2020-12-30 NOTE — Evaluation (Signed)
Physical Therapy Evaluation Patient Details Name: Scott [name] MRN: [id number] DOB: Nov 25, 1960 Today's Date: 12/30/2020   History of Present Illness  Scott [name] 60 y.o. y.o.male who presents with complaints of lifestyle limiting claudication and pain continuously in the feet bilaterally. PMH: COPD, HTN, depression, anxiety, and PVD. S/P Bilateral common femoral, superficial femoral and profunda femoris endarterectomy with Cormatrix patch angioplasty on 12/29/20  Clinical Impression  Patient seated edge of bed upon arrival to session. Alert and oriented, follows commands and agreeable to participation with session.  Rates pain in L LE, bilat groin grossly 4/10; endorses persistent edema throughout L LE (LE compartments compressible).  Decreased strength/ROM to L ankle DF noted with formal strength/ROM testing; denies sensory, coordination deficit. Able to complete bed mobility with mod indep; sit/stand, basic transfers and gait (80') with RW, cga/min assist.  Demonstrates 3-point, step to gait pattern with min WBing bilat UEs; mild steppage gait L LE due to foot drop noted in swing.  Limited/no heel strike L LE.  Distance limited by pain in L LE/calf with gait efforts. Would benefit from skilled PT to address above deficits and promote optimal return to PLOF.; Recommend transition to HHPT upon discharge from acute hospitalization.     Follow Up Recommendations Home health PT    Equipment Recommendations  Rolling walker with 5" wheels;3in1 (PT)    Recommendations for Other Services       Precautions / Restrictions Precautions Precautions: Fall Restrictions Weight Bearing Restrictions: No      Mobility  Bed Mobility Overal bed mobility: Modified Independent                  Transfers Overall transfer level: Needs assistance Equipment used: Rolling walker (2 wheeled) Transfers: Sit to/from Stand Sit to Stand: Min guard         General transfer comment: cuing for  hand placement to prevent pulling on RW  Ambulation/Gait Ambulation/Gait assistance: Min guard Gait Distance (Feet): 80 Feet Assistive device: Rolling walker (2 wheeled)       General Gait Details: 3-point, step to gait pattern with min WBing bilat UEs; mild steppage gait L LE due to foot drop noted in swing.  Limited/no heel strike L LE.  Distance limited by pain in L LE/calf with gait efforts.  Stairs            Wheelchair Mobility    Modified Rankin (Stroke Patients Only)       Balance Overall balance assessment: Needs assistance Sitting-balance support: No upper extremity supported;Feet supported Sitting balance-Leahy Scale: Good     Standing balance support: Bilateral upper extremity supported Standing balance-Leahy Scale: Fair                               Pertinent Vitals/Pain Pain Assessment: Faces Faces Pain Scale: Hurts little more Pain Location: bilat groin, L LE Pain Descriptors / Indicators: Discomfort;Grimacing;Aching Pain Intervention(s): Limited activity within patient's tolerance;Monitored during session;Repositioned    Home Living Family/patient expects to be discharged to:: Private residence Living Arrangements: Other relatives Available Help at Discharge: Family;Available 24 hours/day Type of Home: Apartment Home Access: Level entry     Home Layout: One level Home Equipment: Cane - single point;Crutches      Prior Function Level of Independence: Independent with assistive device(s)         Comments: MOD I using SPC for mobility and ADLs; denies fall history.     Hand  Dominance        Extremity/Trunk Assessment   Upper Extremity Assessment Upper Extremity Assessment: Overall WFL for tasks assessed    Lower Extremity Assessment Lower Extremity Assessment: Generalized weakness (grossly 4-/5 throghout, except L ankle DF 3-/5; moderate edema mid-calf distally L LE (compressible).  Denies sensory deficit)        Communication   Communication: No difficulties  Cognition Arousal/Alertness: Awake/alert Behavior During Therapy: WFL for tasks assessed/performed Overall Cognitive Status: Within Functional Limits for tasks assessed                                 General Comments: perseverative on having foley removed in order to pee, poor safety awareness      General Comments General comments (skin integrity, edema, etc.): SpO2 90s on RA    Exercises Other Exercises Other Exercises: Educated in role of PT and progressive mobility, L LE therex for ROM/edema control, positioning/elevation of L LE for edema management, safety/mechanics with gait/transfers, use of RW. Patient voiced understanding of all information. Other Exercises: Positioned in supine with L LE elevated on pillows end of session.   Assessment/Plan    PT Assessment Patient needs continued PT services  PT Problem List Decreased strength;Decreased range of motion;Decreased activity tolerance;Decreased balance;Decreased mobility;Decreased coordination;Decreased knowledge of use of DME;Decreased safety awareness;Decreased knowledge of precautions;Decreased skin integrity;Pain       PT Treatment Interventions DME instruction;Gait training;Stair training;Functional mobility training;Therapeutic activities;Therapeutic exercise;Balance training;Patient/family education    PT Goals (Current goals can be found in the Care Plan section)  Acute Rehab PT Goals Patient Stated Goal: to go home PT Goal Formulation: With patient Time For Goal Achievement: 01/13/21 Potential to Achieve Goals: Good    Frequency Min 2X/week   Barriers to discharge        Co-evaluation               AM-PAC PT "6 Clicks" Mobility  Outcome Measure Help needed turning from your back to your side while in a flat bed without using bedrails?: None Help needed moving from lying on your back to sitting on the side of a flat bed without using  bedrails?: None Help needed moving to and from a bed to a chair (including a wheelchair)?: A Little Help needed standing up from a chair using your arms (e.g., wheelchair or bedside chair)?: A Little Help needed to walk in hospital room?: A Little Help needed climbing 3-5 steps with a railing? : A Little 6 Click Score: 20    End of Session Equipment Utilized During Treatment: Gait belt Activity Tolerance: Patient tolerated treatment well Patient left: in bed;with call bell/phone within reach Nurse Communication: Mobility status PT Visit Diagnosis: Muscle weakness (generalized) (M62.81);Difficulty in walking, not elsewhere classified (R26.2)    Time: 8315-1761 PT Time Calculation (min) (ACUTE ONLY): 23 min   Charges:   PT Evaluation $PT Eval Moderate Complexity: 1 Mod PT Treatments $Therapeutic Activity: 8-22 mins       Florean Hoobler H. Manson Passey, PT, DPT, NCS 12/30/20, 4:59 PM [phone]

## 2020-12-30 NOTE — Progress Notes (Signed)
PHARMACIST - PHYSICIAN COMMUNICATION  CONCERNING: IV to Oral Route Change Policy  RECOMMENDATION: This patient is receiving Pepcid by the intravenous route.  Based on criteria approved by the Pharmacy and Therapeutics Committee, the intravenous medication(s) is/are being converted to the equivalent oral dose form(s).   DESCRIPTION: These criteria include: The patient is eating (either orally or via tube) and/or has been taking other orally administered medications for a least 24 hours The patient has no evidence of active gastrointestinal bleeding or impaired GI absorption (gastrectomy, short bowel, patient on TNA or NPO).  If you have questions about this conversion, please contact the Pharmacy Department    Tressie Ellis, [facility] 12/30/2020 8:09 AM

## 2020-12-30 NOTE — Evaluation (Signed)
Occupational Therapy Evaluation Patient Details Name: Scott [name] MRN: [id number] DOB: [birth year] Today's Date: 12/30/2020    History of Present Illness Scott [name] 60 y.o. y.o.male who presents with complaints of lifestyle limiting claudication and pain continuously in the feet bilaterally. PMH: COPD, HTN, depression, anxiety, and PVD. S/P Bilateral common femoral, superficial femoral and profunda femoris endarterectomy with Cormatrix patch angioplasty on 12/29/20   Clinical Impression   Scott [name] was seen for OT evaluation this date. Prior to hospital admission, pt was MOD I for mobility and ADLs using SPC. Pt lives with his sister in apartment. Pt presents to acute OT demonstrating impaired ADL performance and functional mobility 2/2 decreased activity tolerance, poor safety awareness, and functional balance/strength deficits. Pt currently requires SETUP + SBA don B socks seated EOB. CGA + RW for toilet t/f and perihygiene in standing - tolerates ~5 min standing for pericare. Pt tolerated ~60 ft mobility with MIN cues t/o for safety. Pt would benefit from skilled OT to address noted impairments and functional limitations (see below for any additional details) in order to maximize safety and independence while minimizing falls risk and caregiver burden. Upon hospital discharge, recommend no OT follow up.     Follow Up Recommendations  No OT follow up;Supervision - Intermittent    Equipment Recommendations  3 in 1 bedside commode    Recommendations for Other Services       Precautions / Restrictions Precautions Precautions: Fall Restrictions Weight Bearing Restrictions: No      Mobility Bed Mobility Overal bed mobility: Modified Independent                  Transfers Overall transfer level: Needs assistance Equipment used: Rolling walker (2 wheeled) Transfers: Sit to/from Stand Sit to Stand: Min guard              Balance Overall balance assessment: Needs  assistance Sitting-balance support: No upper extremity supported;Feet supported Sitting balance-Leahy Scale: Good     Standing balance support: Single extremity supported;During functional activity Standing balance-Leahy Scale: Good                             ADL either performed or assessed with clinical judgement   ADL Overall ADL's : Needs assistance/impaired                                       General ADL Comments: SETUP + SBA don B socks seated EOB. CGA + RW for toilet t/f and perihygiene in standing - tolerates ~5 min standing for pericare. Pt tolerated ~60 ft mobility with MIN cues t/o for safety      Pertinent Vitals/Pain Pain Assessment: Faces Faces Pain Scale: Hurts a little bit Pain Location: groin Pain Descriptors / Indicators: Discomfort;Grimacing Pain Intervention(s): Limited activity within patient's tolerance;Repositioned     Hand Dominance     Extremity/Trunk Assessment Upper Extremity Assessment Upper Extremity Assessment: Overall WFL for tasks assessed   Lower Extremity Assessment Lower Extremity Assessment: Generalized weakness       Communication Communication Communication: No difficulties   Cognition Arousal/Alertness: Awake/alert Behavior During Therapy: WFL for tasks assessed/performed Overall Cognitive Status: Within Functional Limits for tasks assessed  General Comments: perseverative on having foley removed in order to pee, poor safety awareness   General Comments  SpO2 90s on RA    Exercises Exercises: Other exercises Other Exercises Other Exercises: Pt educated re; OT role, DME recs, d/c recs, falls prevention Other Exercises: LBD, toileting, sup>sit, sit<>stand, sitting/standing balance/tolerance, ~60 ft mobility   Shoulder Instructions      Home Living Family/patient expects to be discharged to:: Private residence Living Arrangements: Other relatives  (sister) Available Help at Discharge: Family;Available 24 hours/day Type of Home: Apartment Home Access: Level entry     Home Layout: One level               Home Equipment: Cane - single point;Crutches          Prior Functioning/Environment Level of Independence: Independent with assistive device(s)        Comments: MOD I using SPC for mobility and ADLs        OT Problem List: Decreased strength;Decreased range of motion;Decreased activity tolerance;Impaired balance (sitting and/or standing);Decreased safety awareness      OT Treatment/Interventions: Self-care/ADL training;Therapeutic exercise;Energy conservation;DME and/or AE instruction;Therapeutic activities;Patient/family education;Balance training    OT Goals(Current goals can be found in the care plan section) Acute Rehab OT Goals Patient Stated Goal: to go home OT Goal Formulation: With patient Time For Goal Achievement: 01/13/21 Potential to Achieve Goals: Good ADL Goals Pt Will Perform Grooming: Independently;standing Pt Will Perform Lower Body Dressing: Independently;sit to/from stand Pt Will Transfer to Toilet: with modified independence;ambulating;regular height toilet (c LRAD PRN)  OT Frequency: Min 1X/week   Barriers to D/C:            Co-evaluation              AM-PAC OT "6 Clicks" Daily Activity     Outcome Measure Help from another person eating meals?: None Help from another person taking care of personal grooming?: None Help from another person toileting, which includes using toliet, bedpan, or urinal?: A Little Help from another person bathing (including washing, rinsing, drying)?: A Little Help from another person to put on and taking off regular upper body clothing?: None Help from another person to put on and taking off regular lower body clothing?: A Little 6 Click Score: 21   End of Session Equipment Utilized During Treatment: Rolling walker Nurse Communication: Mobility  status  Activity Tolerance: Patient tolerated treatment well Patient left: in bed;with call bell/phone within reach  OT Visit Diagnosis: Other abnormalities of gait and mobility (R26.89)                Time: 1104-1140 OT Time Calculation (min): 36 min Charges:  OT General Charges $OT Visit: 1 Visit OT Evaluation $OT Eval Moderate Complexity: 1 Mod OT Treatments $Self Care/Home Management : 23-37 mins  Kathie Dike, M.S. OTR/L  12/30/20, 1:45 PM  ascom [phone]

## 2020-12-30 NOTE — Progress Notes (Signed)
1 Day Post-Op   Subjective/Chief Complaint: Patient is doing well and states that the left leg feels slightly swollen, but better.  Groins feel fine.    Objective: Vital signs in last 24 hours: Temp:  [98.1 F (36.7 C)-99 F (37.2 C)] 98.1 F (36.7 C) (09/10 0800) Pulse Rate:  [78-107] 78 (09/10 0933) Resp:  [12-26] 14 (09/10 0933) BP: (98-132)/(58-91) 125/75 (09/10 0933) SpO2:  [91 %-97 %] 94 % (09/10 0933) FiO2 (%):  [28 %] 28 % (09/09 1336) Weight:  [79.3 kg] 79.3 kg (09/09 1433) Last BM Date:  (PTA)  Intake/Output from previous day: 09/09 0701 - 09/10 0700 In: 5133.4 [I.V.:3603.3; Blood:430; IV Piggyback:1100.1] Out: 2005 [Urine:1105; Blood:900] Intake/Output this shift: No intake/output data recorded.  General appearance: alert, cooperative, and appears stated age Head: Normocephalic, without obvious abnormality, atraumatic Eyes: conjunctivae/corneas clear. PERRL, EOM's intact. Fundi benign. Back: symmetric, no curvature. ROM normal. No CVA tenderness. Resp: clear to auscultation bilaterally Cardio: regular rate and rhythm, S1, S2 normal, no murmur, click, rub or gallop Extremities: extremities normal, atraumatic, no cyanosis or edema and edema Left leg with +1 edema. Pulses: weakly palpable pulse on the left and +1 on the right.  Skin: Skin color, texture, turgor normal. No rashes or lesions Incision/Wound:   Bilateral groins are clean, incisions intact.   Lab Results:  Recent Labs    12/29/20 1516 12/29/20 2349  WBC 21.0* 15.4*  HGB 11.9* 10.8*  HCT 32.9* 29.6*  PLT 256 240   BMET Recent Labs    12/28/20 1023 12/29/20 1516 12/29/20 2349  NA 124*  --  129*  K 3.8  --  4.1  CL 87*  --  96*  CO2 26  --  26  GLUCOSE 182*  --  130*  BUN 13  --  13  CREATININE 0.71 0.72 0.65  CALCIUM 8.6*  --  7.7*   PT/INR No results for input(s): LABPROT, INR in the last 72 hours. ABG No results for input(s): PHART, HCO3 in the last 72 hours.  Invalid input(s):  PCO2, PO2  Studies/Results: DG C-Arm 1-60 Min-No Report  Result Date: 12/29/2020 Fluoroscopy was utilized by the requesting physician.  No radiographic interpretation.    Anti-infectives: Anti-infectives (From admission, onward)    Start     Dose/Rate Route Frequency Ordered Stop   12/29/20 2000  ceFAZolin (ANCEF) IVPB 2g/100 mL premix        2 g 200 mL/hr over 30 Minutes Intravenous Every 8 hours 12/29/20 1434 12/30/20 0438   12/29/20 0624  ceFAZolin (ANCEF) 2-4 GM/100ML-% IVPB       Note to Pharmacy: Kerman Passey, Cryst: cabinet override      12/29/20 0624 12/29/20 0755   12/29/20 0600  ceFAZolin (ANCEF) IVPB 2g/100 mL premix        2 g 200 mL/hr over 30 Minutes Intravenous On call to O.R. 12/29/20 0150 12/29/20 1203       Assessment/Plan: s/p Procedure(s) with comments: ENDARTERECTOMY FEMORAL (Bilateral) - Left SFA and Tibial intervention APPLICATION OF CELL SAVER (N/A) Plan for discharge tomorrow  LOS: 1 day    Louisa Second 12/30/2020

## 2020-12-31 NOTE — TOC Transition Note (Signed)
Transition of Care [facility]) - CM/SW Discharge Note   Patient Details  Name: Scott [name] MRN: [id number] Date of Birth: 1961-02-18  Transition of Care [facility]) CM/SW Contact:  Margarito Liner, LCSW Phone Number: 12/31/2020, 1:43 PM   Clinical Narrative:  Patient has orders to discharge home today. No further concerns. CSW signing off.   Final next level of care: Home/Self Care Barriers to Discharge: No Barriers Identified   Patient Goals and CMS Choice     Choice offered to / list presented to : NA  Discharge Placement                    Patient and family notified of of transfer: 12/31/20  Discharge Plan and Services     Post Acute Care Choice: Durable Medical Equipment          DME Arranged: Dan Humphreys rolling DME Agency: [employer] Date DME Agency Contacted: 12/31/20   Representative spoke with at DME Agency: Leavy Cella            Social Determinants of Health (SDOH) Interventions     Readmission Risk Interventions No flowsheet data found.

## 2020-12-31 NOTE — Discharge Summary (Signed)
Physician Discharge Summary  Patient ID: Scott [name] MRN: [id number] DOB/AGE: 1960-12-14 60 y.o.  Admit date: 12/29/2020 Discharge date: 12/31/2020  Admission Diagnoses:  Discharge Diagnoses:  Active Problems:   Atherosclerosis of artery of extremity with rest pain [facility])   Discharged Condition: good  Hospital Course: Good progress without events.  Consults: None  Significant Diagnostic Studies: angiography: Aortogram with runoff   Treatments: surgery: 1.   Left common femoral, profunda femoris, and superficial femoral artery endarterectomies 2.   Right common femoral, profunda femoris, and superficial femoral artery endarterectomies 3.   Separate left profunda femoris endarterectomy with separate arteriotomy and separate patch angioplasty after initial eversion endarterectomy imaging showed significant residual disease beyond the eversion endarterectomy 4.   Left lower extremity angiogram 5.  Angioplasty of the left popliteal artery with 5 mm diameter Lutonix drug-coated angioplasty balloon 6.  Percutaneous transluminal angioplasty of left posterior tibial artery and tibioperoneal trunk with 3 mm diameter angioplasty balloon 7.  Percutaneous transluminal angioplasty of left anterior tibial artery with 2.5 mm diameter angioplasty balloon  Discharge Exam: Blood pressure 128/87, pulse 97, temperature 98.4 F (36.9 C), resp. rate 14, height 5\' 10"  (1.778 m), weight 79.3 kg, SpO2 93 %. General appearance: alert, cooperative, appears stated age, and no distress Resp: clear to auscultation bilaterally and normal percussion bilaterally Cardio: regular rate and rhythm, S1, S2 normal, no murmur, click, rub or gallop Extremities: extremities normal, atraumatic, no cyanosis or edema Pulses: Right Pulses: FEM: doppler, POP: doppler, DP: doppler, PT: doppler Left Pulses: FEM: present 1+, POP: present 1+, DP: present 1+, PT: doppler Incision/Wound: Groin incisions are clean and dressings  are intact.  Disposition:  Home   Allergies as of 12/31/2020       Reactions   Acetaminophen-codeine Other (See Comments)   Other reaction(s): Other (See Comments) Nausea and "hot flash" Nausea and "hot flash"   Codeine Nausea Only   Varenicline Nausea Only   Sour taste in mouth        Medication List     STOP taking these medications    lovastatin 20 MG tablet Commonly known as: MEVACOR   predniSONE 10 MG (21) Tbpk tablet Commonly known as: STERAPRED UNI-PAK 21 TAB   traMADol 50 MG tablet Commonly known as: ULTRAM       TAKE these medications    acetaminophen 325 MG tablet Commonly known as: TYLENOL Take 650 mg by mouth every 6 (six) hours as needed.   amLODipine 10 MG tablet Commonly known as: NORVASC Take 10 mg by mouth daily. What changed: Another medication with the same name was removed. Continue taking this medication, and follow the directions you see here.   aspirin EC 81 MG tablet Take 1 tablet (81 mg total) by mouth daily. What changed: Another medication with the same name was removed. Continue taking this medication, and follow the directions you see here.   atorvastatin 40 MG tablet Commonly known as: LIPITOR Take 40 mg by mouth daily. What changed: Another medication with the same name was removed. Continue taking this medication, and follow the directions you see here.   Breo Ellipta 200-25 MCG/INH Aepb Generic drug: fluticasone furoate-vilanterol Inhale 1 puff into the lungs daily.   clopidogrel 75 MG tablet Commonly known as: Plavix Take 1 tablet (75 mg total) by mouth daily.   HYDROcodone-acetaminophen 5-325 MG tablet Commonly known as: NORCO/VICODIN Take 1 tablet by mouth every 4 (four) hours as needed.   isosorbide mononitrate 30 MG 24 hr tablet Commonly  known as: IMDUR Take 30 mg by mouth daily.   lisinopril-hydrochlorothiazide 20-25 MG tablet Commonly known as: ZESTORETIC Take 1 tablet by mouth daily.   metoprolol  succinate 25 MG 24 hr tablet Commonly known as: TOPROL-XL Take 25 mg by mouth daily.   naproxen 500 MG tablet Commonly known as: NAPROSYN Take 1 tablet (500 mg total) by mouth 2 (two) times daily.   sodium chloride 0.65 % Soln nasal spray Commonly known as: OCEAN Place 1 spray into both nostrils daily as needed for congestion.               Durable Medical Equipment  (From admission, onward)           Start     Ordered   12/31/20 1205  For home use only DME Walker rolling  Once       Question Answer Comment  Walker: With 5 Inch Wheels   Patient needs a walker to treat with the following condition PAD (peripheral artery disease) (HCC)      12/31/20 1205             Signed: Louisa Second 12/31/2020, 1:23 PM

## 2020-12-31 NOTE — TOC Initial Note (Signed)
Transition of Care [facility]) - Initial/Assessment Note    Patient Details  Name: Scott [name] MRN: [id number] Date of Birth: 08-09-1960  [facility]) CM/SW Contact:    Candie Chroman, LCSW Phone Number: 12/31/2020, 12:08 PM  Clinical Narrative:  CSW met with patient. No supports at bedside. CSW introduced role and explained that PT recommendations would be discussed. Patient declined HHPT and 3-in-1 but agreeable to walker. CSW ordered through [employer]. No further concerns. CSW encouraged patient to contact CSW as needed. CSW will continue to follow patient for support and facilitate return home when stable.                Expected Discharge Plan: Home/Self Care Barriers to Discharge: Continued Medical Work up   Patient Goals and CMS Choice     Choice offered to / list presented to : NA  Expected Discharge Plan and Services Expected Discharge Plan: Home/Self Care     Post Acute Care Choice: Durable Medical Equipment Living arrangements for the past 2 months: Apartment                 DME Arranged: Walker rolling DME Agency: [employer] Date DME Agency Contacted: 12/31/20   Representative spoke with at DME Agency: Delana Meyer            Prior Living Arrangements/Services Living arrangements for the past 2 months: Apartment Lives with:: Relatives Patient language and need for interpreter reviewed:: Yes Do you feel safe going back to the place where you live?: Yes      Need for Family Participation in Patient Care: Yes (Comment) Care giver support system in place?: Yes (comment) Current home services: DME Criminal Activity/Legal Involvement Pertinent to Current Situation/Hospitalization: No - Comment as needed  Activities of Daily Living Home Assistive Devices/Equipment: Cane (specify quad or straight), Eyeglasses, Shower chair with back ADL Screening (condition at time of admission) Patient's cognitive ability adequate to safely complete daily activities?:  Yes Is the patient deaf or have difficulty hearing?: No Does the patient have difficulty seeing, even when wearing glasses/contacts?: No Does the patient have difficulty concentrating, remembering, or making decisions?: No Patient able to express need for assistance with ADLs?: Yes Does the patient have difficulty dressing or bathing?: No Independently performs ADLs?: Yes (appropriate for developmental age) Does the patient have difficulty walking or climbing stairs?: No Weakness of Legs: None Weakness of Arms/Hands: None  Permission Sought/Granted                  Emotional Assessment Appearance:: Appears stated age Attitude/Demeanor/Rapport: Engaged, Gracious Affect (typically observed): Accepting, Appropriate, Calm, Pleasant Orientation: : Oriented to Self, Oriented to Place, Oriented to  Time, Oriented to Situation Alcohol / Substance Use: Not Applicable Psych Involvement: No (comment)  Admission diagnosis:  Atherosclerosis of artery of extremity with rest pain [facility]) [I70.229] Patient Active Problem List   Diagnosis Date Noted   Atherosclerosis of artery of extremity with rest pain ([place]) 12/29/2020   Atherosclerosis of native arteries of extremity with rest pain ([place]) 12/26/2020   AAA (abdominal aortic aneurysm) without rupture ([place]) 11/28/2020   Carotid stenosis 11/28/2020   Atherosclerosis of native arteries of extremity with intermittent claudication ([place]) 02/01/2020   PAOD (peripheral arterial occlusive disease) ([place]) 01/21/2020   Vitamin B12 deficiency 09/02/2018   Vitamin D deficiency 09/02/2018   Chest congestion 05/27/2018   Rib pain on right side 05/27/2018   High risk medication use 03/03/2018   Arthritis involving multiple sites 03/28/2017  Daily consumption of alcohol 03/28/2017   Dupuytren's contracture of both hands 03/28/2017   Family history of rheumatoid arthritis 03/28/2017   Intermittent chest pain 03/28/2017   Status post ablation of ventricular  arrhythmia 03/28/2017   SVT (supraventricular tachycardia) ([place]) 12/20/2015   Cigarette smoker 06/19/2015   Hypercholesterolemia 06/19/2015   Allergic rhinitis 12/03/2013   Anxiety 12/03/2013   CAD (coronary artery disease) 12/03/2013   COPD (chronic obstructive pulmonary disease) ([place]) 12/03/2013   Hypertension 12/03/2013   PCP:  Toni Arthurs, NP Pharmacy:   [id number] [place] [id number]: [phone]: [phone]    Social Determinants of Health (SDOH) Interventions    Readmission Risk Interventions No flowsheet data found.

## 2021-01-01 ENCOUNTER — Encounter: Payer: Self-pay | Admitting: Vascular Surgery

## 2021-01-02 LAB — SURGICAL PATHOLOGY

## 2021-01-07 ENCOUNTER — Emergency Department: Payer: 59

## 2021-01-07 ENCOUNTER — Emergency Department
Admission: EM | Admit: 2021-01-07 | Discharge: 2021-01-07 | Disposition: A | Discharge status: Home / Self Care | Payer: 59 | Attending: Emergency Medicine | Admitting: Emergency Medicine

## 2021-01-07 ENCOUNTER — Other Ambulatory Visit: Payer: Self-pay

## 2021-01-07 DIAGNOSIS — Z79899 Other long term (current) drug therapy: Secondary | ICD-10-CM | POA: Diagnosis not present

## 2021-01-07 DIAGNOSIS — L03116 Cellulitis of left lower limb: Secondary | ICD-10-CM

## 2021-01-07 DIAGNOSIS — Z7902 Long term (current) use of antithrombotics/antiplatelets: Secondary | ICD-10-CM | POA: Insufficient documentation

## 2021-01-07 DIAGNOSIS — E871 Hypo-osmolality and hyponatremia: Secondary | ICD-10-CM | POA: Insufficient documentation

## 2021-01-07 DIAGNOSIS — I82452 Acute embolism and thrombosis of left peroneal vein: Secondary | ICD-10-CM | POA: Diagnosis not present

## 2021-01-07 DIAGNOSIS — F17210 Nicotine dependence, cigarettes, uncomplicated: Secondary | ICD-10-CM | POA: Insufficient documentation

## 2021-01-07 DIAGNOSIS — Z8616 Personal history of COVID-19: Secondary | ICD-10-CM | POA: Insufficient documentation

## 2021-01-07 DIAGNOSIS — Z7901 Long term (current) use of anticoagulants: Secondary | ICD-10-CM | POA: Insufficient documentation

## 2021-01-07 DIAGNOSIS — M7989 Other specified soft tissue disorders: Secondary | ICD-10-CM | POA: Diagnosis not present

## 2021-01-07 DIAGNOSIS — J449 Chronic obstructive pulmonary disease, unspecified: Secondary | ICD-10-CM | POA: Diagnosis not present

## 2021-01-07 DIAGNOSIS — I10 Essential (primary) hypertension: Secondary | ICD-10-CM | POA: Diagnosis not present

## 2021-01-07 DIAGNOSIS — M79605 Pain in left leg: Secondary | ICD-10-CM | POA: Diagnosis present

## 2021-01-07 DIAGNOSIS — R69 Illness, unspecified: Secondary | ICD-10-CM | POA: Diagnosis not present

## 2021-01-07 DIAGNOSIS — I2510 Atherosclerotic heart disease of native coronary artery without angina pectoris: Secondary | ICD-10-CM | POA: Insufficient documentation

## 2021-01-07 DIAGNOSIS — Z7982 Long term (current) use of aspirin: Secondary | ICD-10-CM | POA: Insufficient documentation

## 2021-01-07 DIAGNOSIS — I82402 Acute embolism and thrombosis of unspecified deep veins of left lower extremity: Secondary | ICD-10-CM | POA: Diagnosis not present

## 2021-01-07 LAB — COMPREHENSIVE METABOLIC PANEL
ALT: 27 U/L (ref 0–44)
AST: 27 U/L (ref 15–41)
Albumin: 3.5 g/dL (ref 3.5–5.0)
Alkaline Phosphatase: 78 U/L (ref 38–126)
Anion gap: 9 (ref 5–15)
BUN: 9 mg/dL (ref 6–20)
CO2: 29 mmol/L (ref 22–32)
Calcium: 8.5 mg/dL — ABNORMAL LOW (ref 8.9–10.3)
Chloride: 89 mmol/L — ABNORMAL LOW (ref 98–111)
Creatinine, Ser: 0.67 mg/dL (ref 0.61–1.24)
GFR, Estimated: >60 mL/min (ref >60)
Glucose, Bld: 132 mg/dL — ABNORMAL HIGH (ref 70–99)
Potassium: 4 mmol/L (ref 3.5–5.1)
Sodium: 127 mmol/L — ABNORMAL LOW (ref 135–145)
Total Bilirubin: 1 mg/dL (ref 0.3–1.2)
Total Protein: 6.7 g/dL (ref 6.5–8.1)

## 2021-01-07 LAB — CBC WITH DIFFERENTIAL/PLATELET
Abs Immature Granulocytes: 0.07 10*3/uL (ref 0.00–0.07)
Basophils Absolute: 0.1 10*3/uL (ref 0.0–0.1)
Basophils Relative: 1 %
Eosinophils Absolute: 0 10*3/uL (ref 0.0–0.5)
Eosinophils Relative: 0 %
HCT: 32.5 % — ABNORMAL LOW (ref 39.0–52.0)
Hemoglobin: 12 g/dL — ABNORMAL LOW (ref 13.0–17.0)
Immature Granulocytes: 1 %
Lymphocytes Relative: 12 %
Lymphs Abs: 1.2 10*3/uL (ref 0.7–4.0)
MCH: 33.6 pg (ref 26.0–34.0)
MCHC: 36.9 g/dL — ABNORMAL HIGH (ref 30.0–36.0)
MCV: 91 fL (ref 80.0–100.0)
Monocytes Absolute: 0.8 10*3/uL (ref 0.1–1.0)
Monocytes Relative: 8 %
Neutro Abs: 7.9 10*3/uL — ABNORMAL HIGH (ref 1.7–7.7)
Neutrophils Relative %: 78 %
Platelets: 401 10*3/uL — ABNORMAL HIGH (ref 150–400)
RBC: 3.57 MIL/uL — ABNORMAL LOW (ref 4.22–5.81)
RDW: 12.7 % (ref 11.5–15.5)
WBC: 10.1 10*3/uL (ref 4.0–10.5)
nRBC: 0 % (ref 0.0–0.2)

## 2021-01-07 LAB — LACTIC ACID, PLASMA
Lactic Acid, Venous: 2 mmol/L (ref 0.5–1.9)
Lactic Acid, Venous: 1.5 mmol/L (ref 0.5–1.9)

## 2021-01-07 MED ORDER — SODIUM CHLORIDE 0.9 % IV SOLN
1.0000 g | Freq: Once | INTRAVENOUS | Status: AC
  Administered 2021-01-07: 1 g via INTRAVENOUS
  Filled 2021-01-07: qty 10

## 2021-01-07 MED ORDER — CEPHALEXIN 500 MG PO CAPS: 500.0000 mg | ORAL_CAPSULE | Freq: Four times a day (QID) | ORAL | 0 refills | Status: AC

## 2021-01-07 MED ORDER — APIXABAN (ELIQUIS) VTE STARTER PACK (10MG AND 5MG)
ORAL_TABLET | ORAL | 0 refills | Status: DC
Start: 2021-01-07 — End: 2021-08-17

## 2021-01-07 NOTE — ED Provider Notes (Signed)
[facility] Emergency Department Provider Note ____________________________________________   Event Date/Time   First MD Initiated Contact with Patient 01/07/21 1152     (approximate)  I have reviewed the triage vital signs and the nursing notes.   HISTORY  Chief Complaint Wound Infection    HPI Scott [name] is a 60 y.o. male with PMH as noted below including peripheral artery disease status post angioplasty last week who presents with left leg redness over the last week, gradual onset, mainly to the foot and lower left leg, and associated with some mild pain.  It is also somewhat swollen compared to the right.  He denies any pain or redness going up above the knee.  He has had no fever or chills.  The patient states that after his procedures he was instructed to wear compression stockings and believes that they were too tight.  They caused a couple of superficial wounds to the back of his leg.  These are healing well.  He denies any trauma.  Past Medical History:  Diagnosis Date   Anxiety    Arthritis    COPD (chronic obstructive pulmonary disease) (HCC)    Coronary artery disease    COVID-19 2022   Depression    Dyspnea    Dysrhythmia    Hypertension    Pre-diabetes     Patient Active Problem List   Diagnosis Date Noted   Atherosclerosis of artery of extremity with rest pain (HCC) 12/29/2020   Atherosclerosis of native arteries of extremity with rest pain (HCC) 12/26/2020   AAA (abdominal aortic aneurysm) without rupture (HCC) 11/28/2020   Carotid stenosis 11/28/2020   Atherosclerosis of native arteries of extremity with intermittent claudication (HCC) 02/01/2020   PAOD (peripheral arterial occlusive disease) (HCC) 01/21/2020   Vitamin B12 deficiency 09/02/2018   Vitamin D deficiency 09/02/2018   Chest congestion 05/27/2018   Rib pain on right side 05/27/2018   High risk medication use 03/03/2018   Arthritis involving multiple sites  03/28/2017   Daily consumption of alcohol 03/28/2017   Dupuytren's contracture of both hands 03/28/2017   Family history of rheumatoid arthritis 03/28/2017   Intermittent chest pain 03/28/2017   Status post ablation of ventricular arrhythmia 03/28/2017   SVT (supraventricular tachycardia) (HCC) 12/20/2015   Cigarette smoker 06/19/2015   Hypercholesterolemia 06/19/2015   Allergic rhinitis 12/03/2013   Anxiety 12/03/2013   CAD (coronary artery disease) 12/03/2013   COPD (chronic obstructive pulmonary disease) (HCC) 12/03/2013   Hypertension 12/03/2013    Past Surgical History:  Procedure Laterality Date   CHOLECYSTECTOMY     CHOLECYSTECTOMY  2000   ENDARTERECTOMY FEMORAL Bilateral 12/29/2020   Procedure: ENDARTERECTOMY FEMORAL;  Surgeon: Annice Needy, MD;  Location: [facility] ORS;  Service: Vascular;  Laterality: Bilateral;  Left SFA and Tibial intervention   LEFT HEART CATH AND CORONARY ANGIOGRAPHY Left 06/03/2017   Procedure: LEFT HEART CATH AND CORONARY ANGIOGRAPHY;  Surgeon: Alwyn Pea, MD;  Location: [facility] INVASIVE CV LAB;  Service: Cardiovascular;  Laterality: Left;   LOWER EXTREMITY ANGIOGRAPHY Left 02/03/2020   Procedure: LOWER EXTREMITY ANGIOGRAPHY;  Surgeon: Annice Needy, MD;  Location: [facility] INVASIVE CV LAB;  Service: Cardiovascular;  Laterality: Left;   TONSILLECTOMY      Prior to Admission medications   Medication Sig Start Date End Date Taking? Authorizing Provider  APIXABAN (ELIQUIS) VTE STARTER PACK (10MG  AND 5MG ) Take as directed on package: start with two-5mg  tablets twice daily for 7 days. On day 8, switch to  one-5mg  tablet twice daily. 01/07/21  Yes Dionne Bucy, MD  cephALEXin (KEFLEX) 500 MG capsule Take 1 capsule (500 mg total) by mouth 4 (four) times daily for 7 days. 01/07/21 01/14/21 Yes Dionne Bucy, MD  acetaminophen (TYLENOL) 325 MG tablet Take 650 mg by mouth every 6 (six) hours as needed.    [provider]  amLODipine (NORVASC) 10 MG  tablet Take 10 mg by mouth daily. 12/08/20   [provider]  aspirin EC 81 MG tablet Take 1 tablet (81 mg total) by mouth daily. 02/03/20   Annice Needy, MD  atorvastatin (LIPITOR) 40 MG tablet Take 40 mg by mouth daily. 12/07/20   [provider]  BREO ELLIPTA 200-25 MCG/INH AEPB Inhale 1 puff into the lungs daily. 11/11/20   [provider]  clopidogrel (PLAVIX) 75 MG tablet Take 1 tablet (75 mg total) by mouth daily. 02/03/20   Annice Needy, MD  HYDROcodone-acetaminophen (NORCO/VICODIN) 5-325 MG tablet Take 1 tablet by mouth every 4 (four) hours as needed. 12/21/20   [provider]  isosorbide mononitrate (IMDUR) 30 MG 24 hr tablet Take 30 mg by mouth daily. 04/21/17   [provider]  lisinopril-hydrochlorothiazide (PRINZIDE,ZESTORETIC) 20-25 MG per tablet Take 1 tablet by mouth daily.    [provider]  metoprolol succinate (TOPROL-XL) 25 MG 24 hr tablet Take 25 mg by mouth daily.     [provider]  naproxen (NAPROSYN) 500 MG tablet Take 1 tablet (500 mg total) by mouth 2 (two) times daily. 04/17/18   Lurline Idol, FNP  sodium chloride (OCEAN) 0.65 % SOLN nasal spray Place 1 spray into both nostrils daily as needed for congestion.    [provider]    Allergies Acetaminophen-codeine, Codeine, and Varenicline  Family History  Problem Relation Age of Onset   Diabetes Mother    Hypertension Mother    Lupus Mother    Heart attack Father    Cancer Brother    Cancer Brother     Social History Social History   Tobacco Use   Smoking status: Every Day    Packs/day: 1.00    Types: Cigarettes   Smokeless tobacco: Never  Substance Use Topics   Alcohol use: Yes    Comment: 3-4 beers per day   Drug use: No    Review of Systems  Constitutional: No fever. Eyes: No redness. ENT: No sore throat. Cardiovascular: Denies chest pain. Respiratory: Denies shortness of breath. Gastrointestinal: No vomiting or  diarrhea.  Genitourinary: Negative for dysuria.  Musculoskeletal: Negative for back pain. Skin: Negative for rash. Neurological: Negative for headaches, focal weakness or numbness.   ____________________________________________   PHYSICAL EXAM:  VITAL SIGNS: ED Triage Vitals  Enc Vitals Group     BP 01/07/21 0919 112/80     Pulse Rate 01/07/21 0919 98     Resp 01/07/21 0919 18     Temp 01/07/21 0919 98.7 F (37.1 C)     Temp Source 01/07/21 0919 Oral     SpO2 01/07/21 0919 99 %     Weight 01/07/21 0920 170 lb (77.1 kg)     Height 01/07/21 0920 5\' 10"  (1.778 m)     Head Circumference --      Peak Flow --      Pain Score 01/07/21 0926 6     Pain Loc --      Pain Edu? --      Excl. in GC? --     Constitutional: Alert  and oriented. Well appearing and in no acute distress. Eyes: Conjunctivae are normal.  Head: Atraumatic. Nose: No congestion/rhinnorhea. Mouth/Throat: Mucous membranes are moist.   Neck: Normal range of motion.  Cardiovascular: Normal rate, regular rhythm. Good peripheral circulation. Respiratory: Normal respiratory effort.  No retractions.  Gastrointestinal: No distention.  Musculoskeletal: Extremities warm and well perfused.  Left leg with faint erythema from the dorsum of the foot up to two thirds of the way up the calf with warmth and very mild induration.  No significant tenderness.  No fluctuance.  A few small scabbed, closed superficial wounds to the posterior calf which appear to be healing, with no bleeding or discharge.  1+ DP pulse.  Cap refill less than 2 seconds.  Full range of motion at the ankle and knee. Neurologic:  Normal speech and language.  Motor and sensory intact to the left leg.   Skin:  Skin is warm and dry.  Psychiatric: Mood and affect are normal. Speech and behavior are normal.  ____________________________________________   LABS (all labs ordered are listed, but only abnormal results are displayed)  Labs Reviewed  LACTIC ACID,  PLASMA - Abnormal; Notable for the following components:      Result Value   Lactic Acid, Venous 2.0 (*)    All other components within normal limits  COMPREHENSIVE METABOLIC PANEL - Abnormal; Notable for the following components:   Sodium 127 (*)    Chloride 89 (*)    Glucose, Bld 132 (*)    Calcium 8.5 (*)    All other components within normal limits  CBC WITH DIFFERENTIAL/PLATELET - Abnormal; Notable for the following components:   RBC 3.57 (*)    Hemoglobin 12.0 (*)    HCT 32.5 (*)    MCHC 36.9 (*)    Platelets 401 (*)    Neutro Abs 7.9 (*)    All other components within normal limits  LACTIC ACID, PLASMA   ____________________________________________  EKG   ____________________________________________  RADIOLOGY  US venous LLE: Peroneal DVT  ____________________________________________   PROCEDURES  Procedure(s) performed: No  Procedures  Critical Care performed: No ____________________________________________   INITIAL IMPRESSION / ASSESSMENT AND PLAN / ED COURSE  Pertinent labs & imaging results that were available during my care of the patient were reviewed by me and considered in my medical decision making (see chart for details).   60 year old male status with a history of peripheral artery disease and other PMH as noted above status post vascular procedures to the left lower extremity last week presents with redness, swelling, and some pain to the left lower leg after developing a few small wounds from compression stockings.  I reviewed the past medical records in Epic.  The patient was just admitted by vascular surgery a week ago and had multiple left lower extremity endarterectomies and angioplasties.  Palpable left DP pulse was noted after intervention.  He was previously seen in the ED on 8/29 for foot pain.  On exam, the patient is overall well-appearing.  His vital signs are normal.  Left upper extremity exam is as above with some erythema but no  fluctuance, significant tenderness, no open wounds.  DP pulse is 1+ as noted after his procedure.  Overall presentation is consistent with a mild to moderate cellulitis.  Differential also includes dependent edema versus less likely DVT.  The lab work-up was obtained from triage and is unremarkable.  The initial lactate was minimally elevated although I believe that this was spurious as the repeat is  normal without any intervention.  There is no clinical evidence for sepsis.  The patient is slightly hyponatremic but this appears chronic.  He does not have leukocytosis.  I discussed the case with Dr. Lynnea Ferrier from vascular surgery who recommended obtaining a DVT study and otherwise advises starting the patient on antibiotics and discharging him with follow-up this week with Dr. Gilda Crease.  ----------------------------------------- 3:47 PM on 01/07/2021 -----------------------------------------   Ultrasound shows a peroneal DVT.  I discussed this again with Dr. Lynnea Ferrier from vascular surgery.  He recommends starting the patient on Eliquis.  On reassessment, the patient is comfortable appearing.  He very much would like to go home.  I counseled him on the results of the work-up and plan of care.  I have prescribed Keflex for possible cellulitis and Eliquis for the DVT.  I gave him thorough return precautions and he expressed understanding.  I instructed him to follow-up with Dr. Gilda Crease this week.   ____________________________________________   FINAL CLINICAL IMPRESSION(S) / ED DIAGNOSES  Final diagnoses:  None      NEW MEDICATIONS STARTED DURING THIS VISIT:  Discharge Medication List as of 01/07/2021  3:09 PM     START taking these medications   Details  APIXABAN (ELIQUIS) VTE STARTER PACK (10MG  AND 5MG ) Take as directed on package: start with two-5mg  tablets twice daily for 7 days. On day 8, switch to one-5mg  tablet twice daily., Normal    cephALEXin (KEFLEX) 500 MG capsule Take 1  capsule (500 mg total) by mouth 4 (four) times daily for 7 days., Starting Sun 01/07/2021, Until Sun 01/14/2021, Normal         Note:  This document was prepared using Dragon voice recognition software and may include unintentional dictation errors.    01/09/2021, MD 01/07/21 1547

## 2021-01-07 NOTE — Discharge Instructions (Addendum)
Take the Keflex as prescribed and finish the full 1 week course.  Start taking the Eliquis 10 mg twice daily for the next week and then down to 5 mg twice daily as per the starter pack.  Return to the emergency department for new, worsening, or persistent severe leg pain, swelling, fever, weakness, redness going further up the leg, difficulty breathing, chest pain, or any other new or worsening symptoms that concern you.  Called Dr. Marijean Heath office on Monday to arrange for follow-up.

## 2021-01-07 NOTE — ED Triage Notes (Signed)
Pt has some redness, swelling, and 2 quarter sized open spots on the back of his L leg- pt states that it is from where they put too small compression socks on his leg and it cut into his skin- pt denies fevers at home

## 2021-01-08 ENCOUNTER — Telehealth (INDEPENDENT_AMBULATORY_CARE_PROVIDER_SITE_OTHER): Payer: Self-pay | Admitting: Vascular Surgery

## 2021-01-09 ENCOUNTER — Other Ambulatory Visit: Payer: Self-pay

## 2021-01-09 ENCOUNTER — Ambulatory Visit (INDEPENDENT_AMBULATORY_CARE_PROVIDER_SITE_OTHER): Payer: 59 | Admitting: Vascular Surgery

## 2021-01-09 VITALS — BP 112/76 | HR 96 | Ht 70.0 in | Wt 170.0 lb

## 2021-01-09 DIAGNOSIS — Z86718 Personal history of other venous thrombosis and embolism: Secondary | ICD-10-CM | POA: Insufficient documentation

## 2021-01-09 DIAGNOSIS — I70229 Atherosclerosis of native arteries of extremities with rest pain, unspecified extremity: Secondary | ICD-10-CM

## 2021-01-09 DIAGNOSIS — I10 Essential (primary) hypertension: Secondary | ICD-10-CM

## 2021-01-09 DIAGNOSIS — E7800 Pure hypercholesterolemia, unspecified: Secondary | ICD-10-CM

## 2021-01-09 DIAGNOSIS — I82452 Acute embolism and thrombosis of left peroneal vein: Secondary | ICD-10-CM

## 2021-01-09 DIAGNOSIS — I82409 Acute embolism and thrombosis of unspecified deep veins of unspecified lower extremity: Secondary | ICD-10-CM | POA: Insufficient documentation

## 2021-01-09 NOTE — Assessment & Plan Note (Signed)
Rest pain is improved after revascularization, but he has significant reperfusion swelling and now has a tibial DVT which was likely present before his revascularization as he already had marked swelling and profound ischemia for a couple weeks.  He will return in a couple weeks with ABIs.

## 2021-01-09 NOTE — Assessment & Plan Note (Signed)
blood pressure control important in reducing the progression of atherosclerotic disease. On appropriate oral medications.  

## 2021-01-09 NOTE — Assessment & Plan Note (Signed)
lipid control important in reducing the progression of atherosclerotic disease. Continue statin therapy  

## 2021-01-09 NOTE — Assessment & Plan Note (Signed)
This may very well been present prior to his revascularization due to the poor flow as he had marked swelling at that time 2.  He is on anticoagulation now which is more than therapy for his DVT.

## 2021-01-09 NOTE — Progress Notes (Signed)
Patient ID: Scott [name], male   DOB: June 14, 1960, 60 y.o.   MRN: [id number]  Chief Complaint  Patient presents with   Follow-up    Add on per ED admit    HPI Scott [name] is a 60 y.o. male.  Patient returns in follow-up.  He had an ER visit 2 days ago for pain and swelling in his left lower extremity with possible cellulitis in the lower leg.  He has some superficial ulcerations.  Swelling is pronounced.  He did have a peroneal vein DVT and is on Eliquis at this point.  His pain remains significant.  His foot is now warm with excellent capillary refill.  Pulses are difficult to palpate due to the swelling, but his perfusion appears markedly improved.  His incisions are clean dry and intact.  No fever or chills.   Past Medical History:  Diagnosis Date   Anxiety    Arthritis    COPD (chronic obstructive pulmonary disease) (HCC)    Coronary artery disease    COVID-19 2022   Depression    Dyspnea    Dysrhythmia    Hypertension    Pre-diabetes     Past Surgical History:  Procedure Laterality Date   CHOLECYSTECTOMY     CHOLECYSTECTOMY  2000   ENDARTERECTOMY FEMORAL Bilateral 12/29/2020   Procedure: ENDARTERECTOMY FEMORAL;  Surgeon: Annice Needy, MD;  Location: [facility] ORS;  Service: Vascular;  Laterality: Bilateral;  Left SFA and Tibial intervention   LEFT HEART CATH AND CORONARY ANGIOGRAPHY Left 06/03/2017   Procedure: LEFT HEART CATH AND CORONARY ANGIOGRAPHY;  Surgeon: Alwyn Pea, MD;  Location: [facility] INVASIVE CV LAB;  Service: Cardiovascular;  Laterality: Left;   LOWER EXTREMITY ANGIOGRAPHY Left 02/03/2020   Procedure: LOWER EXTREMITY ANGIOGRAPHY;  Surgeon: Annice Needy, MD;  Location: [facility] INVASIVE CV LAB;  Service: Cardiovascular;  Laterality: Left;   TONSILLECTOMY        Allergies  Allergen Reactions   Acetaminophen-Codeine Other (See Comments)    Other reaction(s): Other (See Comments) Nausea and "hot flash" Nausea and "hot flash"    Codeine Nausea Only    Varenicline Nausea Only    Sour taste in mouth    Current Outpatient Medications  Medication Sig Dispense Refill   acetaminophen (TYLENOL) 325 MG tablet Take 650 mg by mouth every 6 (six) hours as needed.     amLODipine (NORVASC) 10 MG tablet Take 10 mg by mouth daily.     APIXABAN (ELIQUIS) VTE STARTER PACK (10MG  AND 5MG ) Take as directed on package: start with two-5mg  tablets twice daily for 7 days. On day 8, switch to one-5mg  tablet twice daily. 1 each 0   aspirin EC 81 MG tablet Take 1 tablet (81 mg total) by mouth daily. 150 tablet 2   atorvastatin (LIPITOR) 40 MG tablet Take 40 mg by mouth daily.     BREO ELLIPTA 200-25 MCG/INH AEPB Inhale 1 puff into the lungs daily.     cephALEXin (KEFLEX) 500 MG capsule Take 1 capsule (500 mg total) by mouth 4 (four) times daily for 7 days. 28 capsule 0   clopidogrel (PLAVIX) 75 MG tablet Take 1 tablet (75 mg total) by mouth daily. 30 tablet 11   HYDROcodone-acetaminophen (NORCO/VICODIN) 5-325 MG tablet Take 1 tablet by mouth every 4 (four) hours as needed.     isosorbide mononitrate (IMDUR) 30 MG 24 hr tablet Take 30 mg by mouth daily.  5   lisinopril-hydrochlorothiazide (PRINZIDE,ZESTORETIC) 20-25 MG per tablet  Take 1 tablet by mouth daily.     metoprolol succinate (TOPROL-XL) 25 MG 24 hr tablet Take 25 mg by mouth daily.      naproxen (NAPROSYN) 500 MG tablet Take 1 tablet (500 mg total) by mouth 2 (two) times daily. 30 tablet 0   sodium chloride (OCEAN) 0.65 % SOLN nasal spray Place 1 spray into both nostrils daily as needed for congestion.     No current facility-administered medications for this visit.        Physical Exam BP 112/76   Pulse 96   Ht 5\' 10"  (1.778 m)   Wt 170 lb (77.1 kg)   BMI 24.39 kg/m  Gen:  WD/WN, NAD Skin: incision C/D/I Ext: 2+ left lower extremity swelling.  Excellent capillary refill with warm foot.  Pulses not easily palpable due to the swelling.  Several superficial wounds and mild erythema is present in  the left lower leg.     Assessment/Plan:  Atherosclerosis of artery of extremity with rest pain (HCC) Rest pain is improved after revascularization, but he has significant reperfusion swelling and now has a tibial DVT which was likely present before his revascularization as he already had marked swelling and profound ischemia for a couple weeks.  He will return in a couple weeks with ABIs.  Hypertension blood pressure control important in reducing the progression of atherosclerotic disease. On appropriate oral medications.   Hypercholesterolemia lipid control important in reducing the progression of atherosclerotic disease. Continue statin therapy   DVT (deep venous thrombosis) (HCC) This may very well been present prior to his revascularization due to the poor flow as he had marked swelling at that time 2.  He is on anticoagulation now which is more than therapy for his DVT.      01/09/2021, 4:30 PM   This note was created with Dragon medical transcription system.  Any errors from dictation are unintentional.

## 2021-01-12 ENCOUNTER — Other Ambulatory Visit (INDEPENDENT_AMBULATORY_CARE_PROVIDER_SITE_OTHER): Payer: Self-pay | Admitting: Vascular Surgery

## 2021-01-15 NOTE — Telephone Encounter (Signed)
Is this ok to fill? 

## 2021-01-18 DIAGNOSIS — I739 Peripheral vascular disease, unspecified: Secondary | ICD-10-CM | POA: Diagnosis not present

## 2021-01-18 DIAGNOSIS — L97421 Non-pressure chronic ulcer of left heel and midfoot limited to breakdown of skin: Secondary | ICD-10-CM | POA: Diagnosis not present

## 2021-01-19 DIAGNOSIS — L89620 Pressure ulcer of left heel, unstageable: Secondary | ICD-10-CM | POA: Diagnosis not present

## 2021-01-22 ENCOUNTER — Other Ambulatory Visit (INDEPENDENT_AMBULATORY_CARE_PROVIDER_SITE_OTHER): Payer: Self-pay | Admitting: Nurse Practitioner

## 2021-01-22 ENCOUNTER — Telehealth (INDEPENDENT_AMBULATORY_CARE_PROVIDER_SITE_OTHER): Payer: Self-pay | Admitting: Vascular Surgery

## 2021-01-22 MED ORDER — SULFAMETHOXAZOLE-TRIMETHOPRIM 800-160 MG PO TABS: 1.0000 | ORAL_TABLET | Freq: Two times a day (BID) | ORAL | 0 refills | Status: DC

## 2021-01-22 MED ORDER — HYDROCODONE-ACETAMINOPHEN 5-325 MG PO TABS: 1.0000 | ORAL_TABLET | ORAL | 0 refills | Status: DC | PRN

## 2021-01-22 NOTE — Telephone Encounter (Signed)
Patient requesting refill for pain medication and refill will be sent to pharmacy by Sheppard Plumber NP.

## 2021-01-22 NOTE — Telephone Encounter (Signed)
Called stating that he is having discharge from incision cite and believes that it is infected. Patient requested that we send antibiotics to [facility] in [place]. Patient had procedure 12/29/20 endarterectomy femoral and developed cellulitis in lle. Patient has upcoming appt with [facility] on 10/12 post procedure. Please advise.    **Patient states he also left vm on nurses line

## 2021-01-22 NOTE — Telephone Encounter (Signed)
Rx sent 

## 2021-01-24 ENCOUNTER — Ambulatory Visit: Payer: 59 | Admitting: Podiatry

## 2021-01-31 ENCOUNTER — Other Ambulatory Visit: Payer: Self-pay

## 2021-01-31 ENCOUNTER — Ambulatory Visit (INDEPENDENT_AMBULATORY_CARE_PROVIDER_SITE_OTHER): Payer: 59 | Admitting: Nurse Practitioner

## 2021-01-31 ENCOUNTER — Other Ambulatory Visit (INDEPENDENT_AMBULATORY_CARE_PROVIDER_SITE_OTHER): Payer: Self-pay | Admitting: Nurse Practitioner

## 2021-01-31 ENCOUNTER — Ambulatory Visit (INDEPENDENT_AMBULATORY_CARE_PROVIDER_SITE_OTHER): Payer: 59

## 2021-01-31 VITALS — BP 136/91 | HR 88 | Ht 70.0 in | Wt 165.0 lb

## 2021-01-31 DIAGNOSIS — T8131XA Disruption of external operation (surgical) wound, not elsewhere classified, initial encounter: Secondary | ICD-10-CM | POA: Diagnosis not present

## 2021-01-31 DIAGNOSIS — Z7689 Persons encountering health services in other specified circumstances: Secondary | ICD-10-CM | POA: Diagnosis not present

## 2021-01-31 DIAGNOSIS — I70212 Atherosclerosis of native arteries of extremities with intermittent claudication, left leg: Secondary | ICD-10-CM

## 2021-01-31 DIAGNOSIS — E7800 Pure hypercholesterolemia, unspecified: Secondary | ICD-10-CM

## 2021-01-31 DIAGNOSIS — F17210 Nicotine dependence, cigarettes, uncomplicated: Secondary | ICD-10-CM

## 2021-01-31 DIAGNOSIS — I70229 Atherosclerosis of native arteries of extremities with rest pain, unspecified extremity: Secondary | ICD-10-CM

## 2021-02-01 MED ORDER — HYDROCODONE-ACETAMINOPHEN 5-325 MG PO TABS: 1.0000 | ORAL_TABLET | ORAL | 0 refills | Status: DC | PRN

## 2021-02-06 ENCOUNTER — Telehealth (INDEPENDENT_AMBULATORY_CARE_PROVIDER_SITE_OTHER): Payer: Self-pay

## 2021-02-06 ENCOUNTER — Telehealth (INDEPENDENT_AMBULATORY_CARE_PROVIDER_SITE_OTHER): Payer: Self-pay | Admitting: Vascular Surgery

## 2021-02-06 DIAGNOSIS — L97421 Non-pressure chronic ulcer of left heel and midfoot limited to breakdown of skin: Secondary | ICD-10-CM | POA: Diagnosis not present

## 2021-02-06 NOTE — Telephone Encounter (Signed)
Called yesterday to check status of paperwork. ([facility] filled out and they were faxed successfully). Patient also wanted to check to see if [facility] was sending over an antibiotic. (Patient didn't specify what kind, just stated that he had a culture taken at last visit and was told an antibiotic would be sent to his pharmacy.) Patient was last seen 01/31/21 with abi studies. ([facility]) Please advise.

## 2021-02-06 NOTE — Telephone Encounter (Signed)
error 

## 2021-02-06 NOTE — Telephone Encounter (Signed)
I told the patient that we were going to culture his wound before we sent an antibiotic.  Currently his wound has drainage but it isn't purulent.  Once we get the culture back (it will tell us what infection he has if any as well as which antibiotics to use). We don't want to keep giving him random antibiotics because this can actually lead to bacteria that is resistant to antibiotics.  This was all explained to him at his office visit.  Therefore once cultures are back if it is necessary we will send in antibiotics at that time.

## 2021-02-08 LAB — AEROBIC CULTURE

## 2021-02-10 ENCOUNTER — Encounter (INDEPENDENT_AMBULATORY_CARE_PROVIDER_SITE_OTHER): Payer: Self-pay | Admitting: Nurse Practitioner

## 2021-02-10 NOTE — Progress Notes (Signed)
Subjective:    Patient ID: Scott [name], male    DOB: 01-31-1961, 60 y.o.   MRN: [id number] Chief Complaint  Patient presents with   Follow-up    3wk abi     Scott [name] is a 60 year old male that presents today following intervention on 12/29/2020 including:  PROCEDURE: 1. Bilateral common femoral, superficial femoral and profunda femoris endarterectomy with Cormatrix patch angioplasty. 2. Additional left profunda femoris endarterectomy with separate and distinct CorMatrix patch angioplasty. 3. Fogarty thrombectomy of the left SFA and popliteal.   4. Introduction catheter into left lower extremity third order catheter placement with additional third order. 5. Open angioplasty to 3 mm left posterior tibial and tibioperoneal trunk. 6. Open angioplasty to 2.5 mm left anterior tibial artery 7. Open angioplasty to 5 mm left popliteal artery with a [employer] drug-eluting balloon   Today the patient is concerned about his groin incisions due to the drainage that they are having.  He describes the drainage as a clear yellow fluid.  There is no puslike drainage or foul-smelling odor.  He is concerned that there is infection.  Wounds themselves have slight evidence of dehiscence with slough near the wounds.  He has also been working with podiatry for his left foot wound.  He notes that he still continues to have pain around the groins.  Today noninvasive studies show an ABI of 1.01 on the right and 1.08 on the left.  The patient has biphasic tibial artery waveforms on the right with monophasic on the left.  The Waveforms in the Toes Are.  The right TBI 0.74 the left is 0.66.   Review of Systems  Musculoskeletal:  Positive for gait problem.  Skin:  Positive for wound.  All other systems reviewed and are negative.     Objective:   Physical Exam Vitals reviewed.  HENT:     Head: Normocephalic.  Cardiovascular:     Rate and Rhythm: Normal rate.     Pulses: Decreased pulses.  Pulmonary:      Effort: Pulmonary effort is normal.  Musculoskeletal:     Right lower leg: 1+ Edema present.     Left lower leg: 1+ Edema present.  Skin:    General: Skin is warm and dry.  Neurological:     Mental Status: He is alert and oriented to person, place, and time.  Psychiatric:        Mood and Affect: Mood normal.        Behavior: Behavior normal.        Thought Content: Thought content normal.        Judgment: Judgment normal.    BP (!) 136/91   Pulse 88   Ht 5\' 10"  (1.778 m)   Wt 165 lb (74.8 kg)   BMI 23.68 kg/m   Past Medical History:  Diagnosis Date   Anxiety    Arthritis    COPD (chronic obstructive pulmonary disease) (HCC)    Coronary artery disease    COVID-19 2022   Depression    Dyspnea    Dysrhythmia    Hypertension    Pre-diabetes     Social History   Socioeconomic History   Marital status: Divorced    Spouse name: Not on file   Number of children: Not on file   Years of education: Not on file   Highest education level: Not on file  Occupational History   Not on file  Tobacco Use   Smoking status: Every Day  Packs/day: 1.00    Types: Cigarettes   Smokeless tobacco: Never  Substance and Sexual Activity   Alcohol use: Yes    Comment: 3-4 beers per day   Drug use: No   Sexual activity: Not on file  Other Topics Concern   Not on file  Social History Narrative   Lives with sister   Social Determinants of Health   Financial Resource Strain: Not on file  Food Insecurity: Not on file  Transportation Needs: Not on file  Physical Activity: Not on file  Stress: Not on file  Social Connections: Not on file  Intimate Partner Violence: Not on file    Past Surgical History:  Procedure Laterality Date   CHOLECYSTECTOMY     CHOLECYSTECTOMY  2000   ENDARTERECTOMY FEMORAL Bilateral 12/29/2020   Procedure: ENDARTERECTOMY FEMORAL;  Surgeon: Annice Needy, MD;  Location: [facility] ORS;  Service: Vascular;  Laterality: Bilateral;  Left SFA and Tibial  intervention   LEFT HEART CATH AND CORONARY ANGIOGRAPHY Left 06/03/2017   Procedure: LEFT HEART CATH AND CORONARY ANGIOGRAPHY;  Surgeon: Alwyn Pea, MD;  Location: [facility];  Service: Cardiovascular;  Laterality: Left;   LOWER EXTREMITY ANGIOGRAPHY Left 02/03/2020   Procedure: LOWER EXTREMITY ANGIOGRAPHY;  Surgeon: Annice Needy, MD;  Location: [facility];  Service: Cardiovascular;  Laterality: Left;   TONSILLECTOMY      Family History  Problem Relation Age of Onset   Diabetes Mother    Hypertension Mother    Lupus Mother    Heart attack Father    Cancer Brother    Cancer Brother     Allergies  Allergen Reactions   Acetaminophen-Codeine Other (See Comments)    Other reaction(s): Other (See Comments) Nausea and "hot flash" Nausea and "hot flash"    Codeine Nausea Only   Varenicline Nausea Only    Sour taste in mouth    CBC Latest Ref Rng & Units 01/07/2021 12/29/2020 12/29/2020  WBC 4.0 - 10.5 K/uL 10.1 15.4(H) 21.0(H)  Hemoglobin 13.0 - 17.0 g/dL 12.0(L) 10.8(L) 11.9(L)  Hematocrit 39.0 - 52.0 % 32.5(L) 29.6(L) 32.9(L)  Platelets 150 - 400 K/uL 401(H) 240 256      CMP     Component Value Date/Time   NA 127 (L) 01/07/2021 0928   K 4.0 01/07/2021 0928   CL 89 (L) 01/07/2021 0928   CO2 29 01/07/2021 0928   GLUCOSE 132 (H) 01/07/2021 0928   BUN 9 01/07/2021 0928   CREATININE 0.67 01/07/2021 0928   CALCIUM 8.5 (L) 01/07/2021 0928   PROT 6.7 01/07/2021 0928   ALBUMIN 3.5 01/07/2021 0928   AST 27 01/07/2021 0928   ALT 27 01/07/2021 0928   ALKPHOS 78 01/07/2021 0928   BILITOT 1.0 01/07/2021 0928   GFRNONAA >60 01/07/2021 0928   GFRAA >60 06/03/2017 1331     VAS [place] ABI WITH/WO TBI  Result Date: 02/02/2021  LOWER EXTREMITY DOPPLER STUDY Patient Name:  Scott [name]  Date of Exam:   01/31/2021 Medical Rec #: [id number]       Accession #:    [id number] Date of Birth: 1960-07-08      Patient Gender: M Patient Age:   46 years Exam Location:   [facility] Procedure:      VAS [place] ABI WITH/WO TBI Referring Phys: Barbara Cower DEW --------------------------------------------------------------------------------  Indications: Peripheral artery disease, and S/P Angioplasty with Stent.  Vascular Interventions: 02/03/2020: Aortogram and Selective Left Lower Extremity  Angiogram. PTA of the Left Anterior Tibial Artery with                         67mm balloon. PTA of the Left SFA and most Proximal                         Popliteal Artery. Viabahn stent placement x2 to the Left                         SFA.                         12/29/2020 Angio Lt. PTA, tibioperoneal trunk, Lt. ATA and                         popliteal artery. Bilateral CIA, SFA and profunda                         endarterectomy. Thrombectomy Lt SFA and popliteal. Comparison Study: 11/28/2020 Performing Technologist: Reece Agar RT (R)(VS)  Examination Guidelines: A complete evaluation includes at minimum, Doppler waveform signals and systolic blood pressure reading at the level of bilateral brachial, anterior tibial, and posterior tibial arteries, when vessel segments are accessible. Bilateral testing is considered an integral part of a complete examination. Photoelectric Plethysmograph (PPG) waveforms and toe systolic pressure readings are included as required and additional duplex testing as needed. Limited examinations for reoccurring indications may be performed as noted.  ABI Findings: +---------+------------------+-----+--------+--------+ Right    Rt Pressure (mmHg)IndexWaveformComment  +---------+------------------+-----+--------+--------+ Brachial 170                                     +---------+------------------+-----+--------+--------+ ATA      164                    biphasic         +---------+------------------+-----+--------+--------+ PTA      172               0.99 biphasic          +---------+------------------+-----+--------+--------+ Great Toe126               0.72 Dampened         +---------+------------------+-----+--------+--------+ +---------+------------------+-----+----------+-------+ Left     Lt Pressure (mmHg)IndexWaveform  Comment +---------+------------------+-----+----------+-------+ Brachial 174                                      +---------+------------------+-----+----------+-------+ ATA      184                    monophasic        +---------+------------------+-----+----------+-------+ PTA      151               0.87 monophasic        +---------+------------------+-----+----------+-------+ Great Toe113               0.65 Dampened          +---------+------------------+-----+----------+-------+ +-------+-----------+-----------+------------+------------+ ABI/TBIToday's ABIToday's TBIPrevious ABIPrevious TBI +-------+-----------+-----------+------------+------------+ Right  1.01       .74        .75         .  70          +-------+-----------+-----------+------------+------------+ Left   1.08       .66        1.15        1.03         +-------+-----------+-----------+------------+------------+  Bilateral ABIs appear essentially unchanged compared to prior study on 11/28/2020.  Summary: Right: Resting right ankle-brachial index is within normal range. No evidence of significant right lower extremity arterial disease. The right toe-brachial index is normal. Right TBI appears essentially unchanged as compared to the previous exam on 11/28/2020. Left: Resting left ankle-brachial index is within normal range. No evidence of significant left lower extremity arterial disease. The left toe-brachial index is abnormal. Left TBI appears slightly decreased as compared to the previous exam on 11/28/2020.  *See table(s) above for measurements and observations.  Electronically signed by Festus Barren MD on 02/02/2021 at 8:51:57 AM.    Final         Assessment & Plan:   1. Postoperative wound dehiscence, initial encounter [name] wounds do not have any evidence of infection but the patient is very concerned about this.  We will obtain culture to determine if antibiotics are necessary.  Explained to the patient the normal course of postoperative healing and that the drainage that he is experiencing is a part of normal healing.  As far as his dehisced wounds the patient has an abundance of wound care supplies from his foot.  He has calcium alginate with silver which would be a good wound dressing choice.  We have instructed the patient to utilize that with gauze over his wounds this should be changed every other day.  We will plan on having the patient return to the office in approximately 4 weeks for wound evaluation.  2. Atherosclerosis of artery of extremity with rest pain (HCC) Postoperative studies are improved post femoral endarterectomy.  The patient should have adequate ability for healing wounds in both his groins as well as his foot.  We will reevaluate his studies in approximately 3 months.  Patient should continue with antiplatelet therapy as prescribed  3. Hypercholesterolemia Continue statin as ordered and reviewed, no changes at this time   4. Cigarette smoker I had a long discussion with the patient regarding his continued cigarette usage.  Despite the surgery that he underwent the patient can still develop worsening plaque resulting in worsening stenosis of his arteries.  It may even be necessary to redo the surgery that was done due to continued damage to his arteries resulting from cigarette smoking.  The patient was strongly encouraged to stop smoking at this time.  Approximately 3 to 10 minutes was spent on this discussion.   Current Outpatient Medications on File Prior to Visit  Medication Sig Dispense Refill   acetaminophen (TYLENOL) 325 MG tablet Take 650 mg by mouth every 6 (six) hours as needed.     APIXABAN  (ELIQUIS) VTE STARTER PACK (10MG  AND 5MG ) Take as directed on package: start with two-5mg  tablets twice daily for 7 days. On day 8, switch to one-5mg  tablet twice daily. 1 each 0   aspirin EC 81 MG tablet Take 1 tablet (81 mg total) by mouth daily. 150 tablet 2   atorvastatin (LIPITOR) 40 MG tablet Take 40 mg by mouth daily.     BREO ELLIPTA 200-25 MCG/INH AEPB Inhale 1 puff into the lungs daily.     clopidogrel (PLAVIX) 75 MG tablet Take 1 tablet (75 mg total) by mouth daily.  30 tablet 11   isosorbide mononitrate (IMDUR) 30 MG 24 hr tablet Take 30 mg by mouth daily.  5   lisinopril-hydrochlorothiazide (PRINZIDE,ZESTORETIC) 20-25 MG per tablet Take 1 tablet by mouth daily.     metoprolol succinate (TOPROL-XL) 25 MG 24 hr tablet Take 25 mg by mouth daily.      naproxen (NAPROSYN) 500 MG tablet Take 1 tablet (500 mg total) by mouth 2 (two) times daily. 30 tablet 0   sodium chloride (OCEAN) 0.65 % SOLN nasal spray Place 1 spray into both nostrils daily as needed for congestion.     sulfamethoxazole-trimethoprim (BACTRIM DS) 800-160 MG tablet Take 1 tablet by mouth 2 (two) times daily. 14 tablet 0   amLODipine (NORVASC) 10 MG tablet Take 10 mg by mouth daily. (Patient not taking: Reported on 01/31/2021)     No current facility-administered medications on file prior to visit.    There are no Patient Instructions on file for this visit. No follow-ups on file.   Georgiana Spinner, NP

## 2021-02-14 ENCOUNTER — Other Ambulatory Visit: Payer: Self-pay

## 2021-02-14 ENCOUNTER — Encounter (INDEPENDENT_AMBULATORY_CARE_PROVIDER_SITE_OTHER): Payer: Self-pay | Admitting: Nurse Practitioner

## 2021-02-14 ENCOUNTER — Ambulatory Visit (INDEPENDENT_AMBULATORY_CARE_PROVIDER_SITE_OTHER): Payer: 59 | Admitting: Nurse Practitioner

## 2021-02-14 VITALS — BP 146/90 | HR 90 | Ht 70.0 in | Wt 168.0 lb

## 2021-02-14 DIAGNOSIS — F17210 Nicotine dependence, cigarettes, uncomplicated: Secondary | ICD-10-CM

## 2021-02-14 DIAGNOSIS — T8131XA Disruption of external operation (surgical) wound, not elsewhere classified, initial encounter: Secondary | ICD-10-CM

## 2021-02-15 DIAGNOSIS — E7800 Pure hypercholesterolemia, unspecified: Secondary | ICD-10-CM | POA: Diagnosis not present

## 2021-02-15 DIAGNOSIS — I779 Disorder of arteries and arterioles, unspecified: Secondary | ICD-10-CM | POA: Diagnosis not present

## 2021-02-15 DIAGNOSIS — I70213 Atherosclerosis of native arteries of extremities with intermittent claudication, bilateral legs: Secondary | ICD-10-CM | POA: Diagnosis not present

## 2021-02-15 DIAGNOSIS — I25118 Atherosclerotic heart disease of native coronary artery with other forms of angina pectoris: Secondary | ICD-10-CM | POA: Diagnosis not present

## 2021-02-19 DIAGNOSIS — L89620 Pressure ulcer of left heel, unstageable: Secondary | ICD-10-CM | POA: Diagnosis not present

## 2021-02-25 ENCOUNTER — Encounter (INDEPENDENT_AMBULATORY_CARE_PROVIDER_SITE_OTHER): Payer: Self-pay | Admitting: Nurse Practitioner

## 2021-02-25 NOTE — Progress Notes (Signed)
Subjective:    Patient ID: Scott [name], male    DOB: 29-Feb-1961, 60 y.o.   MRN: [id number] Chief Complaint  Patient presents with   Follow-up    2 wk wound check     Scott [name] is a 60 year old male that presents today for follow-up evaluation due to his postoperative wounds.  Today the wounds are much improved.  The left is nearly completely healed with just a small pinpoint area that is still open.  The right has some larger areas.  He has been using calcium alginate wound dressings.  His recent wound cultures came back with no evidence of any infection.  He denies any fevers or chills.  He denies any worsening claudication-like symptoms.  The patient does continue to smoke despite being advised to stop.  Overall he is doing well.   Review of Systems  Skin:  Positive for wound.  All other systems reviewed and are negative.     Objective:   Physical Exam Vitals reviewed.  HENT:     Head: Normocephalic.  Cardiovascular:     Rate and Rhythm: Normal rate.     Pulses: Decreased pulses.  Pulmonary:     Effort: Pulmonary effort is normal.  Skin:    General: Skin is warm and dry.     Capillary Refill: Capillary refill takes less than 2 seconds.  Neurological:     Mental Status: He is alert and oriented to person, place, and time.  Psychiatric:        Mood and Affect: Mood normal.        Behavior: Behavior normal.        Thought Content: Thought content normal.        Judgment: Judgment normal.    BP (!) 146/90   Pulse 90   Ht 5\' 10"  (1.778 m)   Wt 168 lb (76.2 kg)   BMI 24.11 kg/m   Past Medical History:  Diagnosis Date   Anxiety    Arthritis    COPD (chronic obstructive pulmonary disease) (HCC)    Coronary artery disease    COVID-19 2022   Depression    Dyspnea    Dysrhythmia    Hypertension    Pre-diabetes     Social History   Socioeconomic History   Marital status: Divorced    Spouse name: Not on file   Number of children: Not on file   Years of  education: Not on file   Highest education level: Not on file  Occupational History   Not on file  Tobacco Use   Smoking status: Every Day    Packs/day: 1.00    Types: Cigarettes   Smokeless tobacco: Never  Substance and Sexual Activity   Alcohol use: Yes    Comment: 3-4 beers per day   Drug use: No   Sexual activity: Not on file  Other Topics Concern   Not on file  Social History Narrative   Lives with sister   Social Determinants of Health   Financial Resource Strain: Not on file  Food Insecurity: Not on file  Transportation Needs: Not on file  Physical Activity: Not on file  Stress: Not on file  Social Connections: Not on file  Intimate Partner Violence: Not on file    Past Surgical History:  Procedure Laterality Date   CHOLECYSTECTOMY     CHOLECYSTECTOMY  2000   ENDARTERECTOMY FEMORAL Bilateral 12/29/2020   Procedure: ENDARTERECTOMY FEMORAL;  Surgeon: Algernon Huxley, MD;  Location: [facility]  ORS;  Service: Vascular;  Laterality: Bilateral;  Left SFA and Tibial intervention   LEFT HEART CATH AND CORONARY ANGIOGRAPHY Left 06/03/2017   Procedure: LEFT HEART CATH AND CORONARY ANGIOGRAPHY;  Surgeon: Yolonda Kida, MD;  Location: [place] [facility];  Service: Cardiovascular;  Laterality: Left;   LOWER EXTREMITY ANGIOGRAPHY Left 02/03/2020   Procedure: LOWER EXTREMITY ANGIOGRAPHY;  Surgeon: Algernon Huxley, MD;  Location: [place] [facility];  Service: Cardiovascular;  Laterality: Left;   TONSILLECTOMY      Family History  Problem Relation Age of Onset   Diabetes Mother    Hypertension Mother    Lupus Mother    Heart attack Father    Cancer Brother    Cancer Brother     Allergies  Allergen Reactions   Acetaminophen-Codeine Other (See Comments)    Other reaction(s): Other (See Comments) Nausea and "hot flash" Nausea and "hot flash"    Codeine Nausea Only   Varenicline Nausea Only    Sour taste in mouth    CBC Latest Ref Rng & Units 01/07/2021 12/29/2020 12/29/2020   WBC 4.0 - 10.5 K/uL 10.1 15.4(H) 21.0(H)  Hemoglobin 13.0 - 17.0 g/dL 12.0(L) 10.8(L) 11.9(L)  Hematocrit 39.0 - 52.0 % 32.5(L) 29.6(L) 32.9(L)  Platelets 150 - 400 K/uL 401(H) 240 256      CMP     Component Value Date/Time   NA 127 (L) 01/07/2021 0928   K 4.0 01/07/2021 0928   CL 89 (L) 01/07/2021 0928   CO2 29 01/07/2021 0928   GLUCOSE 132 (H) 01/07/2021 0928   BUN 9 01/07/2021 0928   CREATININE 0.67 01/07/2021 0928   CALCIUM 8.5 (L) 01/07/2021 0928   PROT 6.7 01/07/2021 0928   ALBUMIN 3.5 01/07/2021 0928   AST 27 01/07/2021 0928   ALT 27 01/07/2021 0928   ALKPHOS 78 01/07/2021 0928   BILITOT 1.0 01/07/2021 0928   GFRNONAA >60 01/07/2021 0928   GFRAA >60 06/03/2017 1331     VAS [place] ABI WITH/WO TBI  Result Date: 02/02/2021  LOWER EXTREMITY DOPPLER STUDY Patient Name:  Scott [name]  Date of Exam:   01/31/2021 Medical Rec #: [id number]       Accession #:    [id number] Date of Birth: 04/08/1961      Patient Gender: M Patient Age:   65 years Exam Location:  [facility] Procedure:      VAS [place] ABI WITH/WO TBI Referring Phys: Corene Cornea DEW --------------------------------------------------------------------------------  Indications: Peripheral artery disease, and S/P Angioplasty with Stent.  Vascular Interventions: 02/03/2020: Aortogram and Selective Left Lower Extremity                         Angiogram. PTA of the Left Anterior Tibial Artery with                         37mm balloon. PTA of the Left SFA and most Proximal                         Popliteal Artery. Viabahn stent placement x2 to the Left                         SFA.                         12/29/2020 Angio Lt. PTA, tibioperoneal trunk,  Lt. ATA and                         popliteal artery. Bilateral CIA, SFA and profunda                         endarterectomy. Thrombectomy Lt SFA and popliteal. Comparison Study: 11/28/2020 Performing Technologist: Charlane Ferretti RT (R)(VS)  Examination Guidelines: A complete evaluation  includes at minimum, Doppler waveform signals and systolic blood pressure reading at the level of bilateral brachial, anterior tibial, and posterior tibial arteries, when vessel segments are accessible. Bilateral testing is considered an integral part of a complete examination. Photoelectric Plethysmograph (PPG) waveforms and toe systolic pressure readings are included as required and additional duplex testing as needed. Limited examinations for reoccurring indications may be performed as noted.  ABI Findings: +---------+------------------+-----+--------+--------+ Right    Rt Pressure (mmHg)IndexWaveformComment  +---------+------------------+-----+--------+--------+ Brachial 170                                     +---------+------------------+-----+--------+--------+ ATA      164                    biphasic         +---------+------------------+-----+--------+--------+ PTA      172               0.99 biphasic         +---------+------------------+-----+--------+--------+ Great Toe126               0.72 Dampened         +---------+------------------+-----+--------+--------+ +---------+------------------+-----+----------+-------+ Left     Lt Pressure (mmHg)IndexWaveform  Comment +---------+------------------+-----+----------+-------+ Brachial 174                                      +---------+------------------+-----+----------+-------+ ATA      184                    monophasic        +---------+------------------+-----+----------+-------+ PTA      151               0.87 monophasic        +---------+------------------+-----+----------+-------+ Great Toe113               0.65 Dampened          +---------+------------------+-----+----------+-------+ +-------+-----------+-----------+------------+------------+ ABI/TBIToday's ABIToday's TBIPrevious ABIPrevious TBI +-------+-----------+-----------+------------+------------+ Right  1.01       .74         .75         .70          +-------+-----------+-----------+------------+------------+ Left   1.08       .66        1.15        1.03         +-------+-----------+-----------+------------+------------+  Bilateral ABIs appear essentially unchanged compared to prior study on 11/28/2020.  Summary: Right: Resting right ankle-brachial index is within normal range. No evidence of significant right lower extremity arterial disease. The right toe-brachial index is normal. Right TBI appears essentially unchanged as compared to the previous exam on 11/28/2020. Left: Resting left ankle-brachial index is within normal range. No evidence of significant left lower extremity arterial disease. The left toe-brachial index is abnormal. Left TBI appears  slightly decreased as compared to the previous exam on 11/28/2020.  *See table(s) above for measurements and observations.  Electronically signed by Festus Barren MD on 02/02/2021 at 8:51:57 AM.    Final        Assessment & Plan:   1. Postoperative wound dehiscence, initial encounter [name] dehisced wounds are currently improving.  There is still some greater dehiscence in the right groin and the left is nearly healed.  The patient will continue with current wound care we will have the patient return in about 1 and half weeks to ensure he is able to return to work without issue.  The patient is also reiterated that the wound dressing should stay in place for 2 to 3 days instead of being changed daily.  2. Cigarette smoker The patient is reminded that smoking cessation is necessary.  The patient has continued smoking cessation this can result in renewed need for intervention.  Approximately 30 minutes spent on this topic   Current Outpatient Medications on File Prior to Visit  Medication Sig Dispense Refill   acetaminophen (TYLENOL) 325 MG tablet Take 650 mg by mouth every 6 (six) hours as needed.     amLODipine (NORVASC) 10 MG tablet Take 10 mg by mouth daily.      APIXABAN (ELIQUIS) VTE STARTER PACK (10MG  AND 5MG ) Take as directed on package: start with two-5mg  tablets twice daily for 7 days. On day 8, switch to one-5mg  tablet twice daily. 1 each 0   aspirin EC 81 MG tablet Take 1 tablet (81 mg total) by mouth daily. 150 tablet 2   atorvastatin (LIPITOR) 40 MG tablet Take 40 mg by mouth daily.     BREO ELLIPTA 200-25 MCG/INH AEPB Inhale 1 puff into the lungs daily.     clopidogrel (PLAVIX) 75 MG tablet Take 1 tablet (75 mg total) by mouth daily. 30 tablet 11   HYDROcodone-acetaminophen (NORCO/VICODIN) 5-325 MG tablet Take 1 tablet by mouth every 4 (four) hours as needed. 30 tablet 0   isosorbide mononitrate (IMDUR) 30 MG 24 hr tablet Take 30 mg by mouth daily.  5   lisinopril-hydrochlorothiazide (PRINZIDE,ZESTORETIC) 20-25 MG per tablet Take 1 tablet by mouth daily.     metoprolol succinate (TOPROL-XL) 25 MG 24 hr tablet Take 25 mg by mouth daily.      naproxen (NAPROSYN) 500 MG tablet Take 1 tablet (500 mg total) by mouth 2 (two) times daily. 30 tablet 0   sodium chloride (OCEAN) 0.65 % SOLN nasal spray Place 1 spray into both nostrils daily as needed for congestion.     sulfamethoxazole-trimethoprim (BACTRIM DS) 800-160 MG tablet Take 1 tablet by mouth 2 (two) times daily. 14 tablet 0   No current facility-administered medications on file prior to visit.    There are no Patient Instructions on file for this visit. No follow-ups on file.   , NP

## 2021-02-26 ENCOUNTER — Ambulatory Visit (INDEPENDENT_AMBULATORY_CARE_PROVIDER_SITE_OTHER): Payer: 59 | Admitting: Nurse Practitioner

## 2021-02-26 ENCOUNTER — Encounter (INDEPENDENT_AMBULATORY_CARE_PROVIDER_SITE_OTHER): Payer: Self-pay | Admitting: Nurse Practitioner

## 2021-02-26 ENCOUNTER — Other Ambulatory Visit: Payer: Self-pay

## 2021-02-26 VITALS — BP 154/88 | HR 87 | Resp 16 | Wt 167.4 lb

## 2021-02-26 DIAGNOSIS — F17210 Nicotine dependence, cigarettes, uncomplicated: Secondary | ICD-10-CM

## 2021-02-26 DIAGNOSIS — T8131XA Disruption of external operation (surgical) wound, not elsewhere classified, initial encounter: Secondary | ICD-10-CM

## 2021-03-04 ENCOUNTER — Encounter (INDEPENDENT_AMBULATORY_CARE_PROVIDER_SITE_OTHER): Payer: Self-pay | Admitting: Nurse Practitioner

## 2021-03-04 NOTE — Progress Notes (Signed)
Subjective:    Patient ID: Scott [name], male    DOB: 02/08/1961, 60 y.o.   MRN: [id number] Chief Complaint  Patient presents with  . Wound Check    1 1/2 week wound check    Scott [name] is a 60 year old male that presents today for follow-up evaluation due to his postoperative wounds.  Today the wounds are much improved.  The left is nearly completely healed with just a small pinpoint area that is still open.  The right has some larger areas.  He has been using calcium alginate wound dressings.  His wounds are even smaller that at his previous wound visit.  At this time the patient is ready to return to work and that is adequate as long as he is not lifting anything heavy extensively.  He denies any fevers or chills.  He denies any worsening claudication-like symptoms.  The patient does continue to smoke despite being advised to stop.  Overall he is doing well.  Review of Systems  Skin:  Positive for wound.      Objective:   Physical Exam Vitals reviewed.  HENT:     Head: Normocephalic.  Cardiovascular:     Rate and Rhythm: Normal rate.     Pulses: Normal pulses.  Pulmonary:     Effort: Pulmonary effort is normal.  Skin:    General: Skin is warm and dry.  Neurological:     Mental Status: He is alert and oriented to person, place, and time.  Psychiatric:        Mood and Affect: Mood normal.        Behavior: Behavior normal.        Thought Content: Thought content normal.        Judgment: Judgment normal.    BP (!) 154/88 (BP Location: Right Arm)   Pulse 87   Resp 16   Wt 167 lb 6.4 oz (75.9 kg)   BMI 24.02 kg/m   Past Medical History:  Diagnosis Date  . Anxiety   . Arthritis   . COPD (chronic obstructive pulmonary disease) ([place])   . Coronary artery disease   . COVID-19 2022  . Depression   . Dyspnea   . Dysrhythmia   . Hypertension   . Pre-diabetes     Social History   Socioeconomic History  . Marital status: Divorced    Spouse name: Not on file  .  Number of children: Not on file  . Years of education: Not on file  . Highest education level: Not on file  Occupational History  . Not on file  Tobacco Use  . Smoking status: Every Day    Packs/day: 1.00    Types: Cigarettes  . Smokeless tobacco: Never  Substance and Sexual Activity  . Alcohol use: Yes    Comment: 3-4 beers per day  . Drug use: No  . Sexual activity: Not on file  Other Topics Concern  . Not on file  Social History Narrative   Lives with sister   Social Determinants of Health   Financial Resource Strain: Not on file  Food Insecurity: Not on file  Transportation Needs: Not on file  Physical Activity: Not on file  Stress: Not on file  Social Connections: Not on file  Intimate Partner Violence: Not on file    Past Surgical History:  Procedure Laterality Date  . CHOLECYSTECTOMY    . CHOLECYSTECTOMY  2000  . ENDARTERECTOMY FEMORAL Bilateral 12/29/2020   Procedure: ENDARTERECTOMY FEMORAL;  Surgeon: Annice Needy, MD;  Location: [facility];  Service: Vascular;  Laterality: Bilateral;  Left SFA and Tibial intervention  . LEFT HEART CATH AND CORONARY ANGIOGRAPHY Left 06/03/2017   Procedure: LEFT HEART CATH AND CORONARY ANGIOGRAPHY;  Surgeon: Alwyn Pea, MD;  Location: [facility];  Service: Cardiovascular;  Laterality: Left;  . LOWER EXTREMITY ANGIOGRAPHY Left 02/03/2020   Procedure: LOWER EXTREMITY ANGIOGRAPHY;  Surgeon: Annice Needy, MD;  Location: [facility];  Service: Cardiovascular;  Laterality: Left;  . TONSILLECTOMY      Family History  Problem Relation Age of Onset  . Diabetes Mother   . Hypertension Mother   . Lupus Mother   . Heart attack Father   . Cancer Brother   . Cancer Brother     Allergies  Allergen Reactions  . Acetaminophen-Codeine Other (See Comments)    Other reaction(s): Other (See Comments) Nausea and "hot flash" Nausea and "hot flash"   . Codeine Nausea Only  . Varenicline Nausea Only    Sour taste in  mouth    CBC Latest Ref Rng & Units 01/07/2021 12/29/2020 12/29/2020  WBC 4.0 - 10.5 K/uL 10.1 15.4(H) 21.0(H)  Hemoglobin 13.0 - 17.0 g/dL 12.0(L) 10.8(L) 11.9(L)  Hematocrit 39.0 - 52.0 % 32.5(L) 29.6(L) 32.9(L)  Platelets 150 - 400 K/uL 401(H) 240 256      CMP     Component Value Date/Time   NA 127 (L) 01/07/2021 0928   K 4.0 01/07/2021 0928   CL 89 (L) 01/07/2021 0928   CO2 29 01/07/2021 0928   GLUCOSE 132 (H) 01/07/2021 0928   BUN 9 01/07/2021 0928   CREATININE 0.67 01/07/2021 0928   CALCIUM 8.5 (L) 01/07/2021 0928   PROT 6.7 01/07/2021 0928   ALBUMIN 3.5 01/07/2021 0928   AST 27 01/07/2021 0928   ALT 27 01/07/2021 0928   ALKPHOS 78 01/07/2021 0928   BILITOT 1.0 01/07/2021 0928   GFRNONAA >60 01/07/2021 0928   GFRAA >60 06/03/2017 1331     VAS [place] ABI WITH/WO TBI  Result Date: 02/02/2021  LOWER EXTREMITY DOPPLER STUDY Patient Name:  IVAN LACHER  Date of Exam:   01/31/2021 Medical Rec #: [id number]       Accession #:    [id number] Date of Birth: [birth year]      Patient Gender: M Patient Age:   46 years Exam [facility] Procedure:      VAS [place] ABI WITH/WO TBI Referring Phys: Barbara Cower DEW --------------------------------------------------------------------------------  Indications: Peripheral artery disease, and S/P Angioplasty with Stent.  Vascular Interventions: 02/03/2020: Aortogram and Selective Left Lower Extremity                         Angiogram. PTA of the Left Anterior Tibial Artery with                         51mm balloon. PTA of the Left SFA and most Proximal                         Popliteal Artery. Viabahn stent placement x2 to the Left                         SFA.  12/29/2020 Angio Lt. PTA, tibioperoneal trunk, Lt. ATA and                         popliteal artery. Bilateral CIA, SFA and profunda                         endarterectomy. Thrombectomy Lt SFA and popliteal. Comparison Study: 11/28/2020 Performing Technologist:  Charlane Ferretti RT (R)(VS)  Examination Guidelines: A complete evaluation includes at minimum, Doppler waveform signals and systolic blood pressure reading at the level of bilateral brachial, anterior tibial, and posterior tibial arteries, when vessel segments are accessible. Bilateral testing is considered an integral part of a complete examination. Photoelectric Plethysmograph (PPG) waveforms and toe systolic pressure readings are included as required and additional duplex testing as needed. Limited examinations for reoccurring indications may be performed as noted.  ABI Findings: +---------+------------------+-----+--------+--------+ Right    Rt Pressure (mmHg)IndexWaveformComment  +---------+------------------+-----+--------+--------+ Brachial 170                                     +---------+------------------+-----+--------+--------+ ATA      164                    biphasic         +---------+------------------+-----+--------+--------+ PTA      172               0.99 biphasic         +---------+------------------+-----+--------+--------+ Great Toe126               0.72 Dampened         +---------+------------------+-----+--------+--------+ +---------+------------------+-----+----------+-------+ Left     Lt Pressure (mmHg)IndexWaveform  Comment +---------+------------------+-----+----------+-------+ Brachial 174                                      +---------+------------------+-----+----------+-------+ ATA      184                    monophasic        +---------+------------------+-----+----------+-------+ PTA      151               0.87 monophasic        +---------+------------------+-----+----------+-------+ Great Toe113               0.65 Dampened          +---------+------------------+-----+----------+-------+ +-------+-----------+-----------+------------+------------+ ABI/TBIToday's ABIToday's TBIPrevious ABIPrevious TBI  +-------+-----------+-----------+------------+------------+ Right  1.01       .74        .75         .70          +-------+-----------+-----------+------------+------------+ Left   1.08       .66        1.15        1.03         +-------+-----------+-----------+------------+------------+  Bilateral ABIs appear essentially unchanged compared to prior study on 11/28/2020.  Summary: Right: Resting right ankle-brachial index is within normal range. No evidence of significant right lower extremity arterial disease. The right toe-brachial index is normal. Right TBI appears essentially unchanged as compared to the previous exam on 11/28/2020. Left: Resting left ankle-brachial index is within normal range. No evidence of significant left lower extremity arterial disease. The left toe-brachial  index is abnormal. Left TBI appears slightly decreased as compared to the previous exam on 11/28/2020.  *See table(s) above for measurements and observations.  Electronically signed by Leotis Pain MD on 02/02/2021 at 8:51:57 AM.    Final        Assessment & Plan:   1. Postoperative wound dehiscence, initial encounter His wounds are very nearly healed.  Patient is instructed to continue with current wound dressings.  We will have the patient return in 3 months for noninvasive studies.  2. Cigarette smoker Smoking cessation was discussed, 3-10 minutes spent on this topic specifically    Current Outpatient Medications on File Prior to Visit  Medication Sig Dispense Refill  . acetaminophen (TYLENOL) 325 MG tablet Take 650 mg by mouth every 6 (six) hours as needed.    Marland Kitchen amLODipine (NORVASC) 10 MG tablet Take 10 mg by mouth daily.    . APIXABAN (ELIQUIS) VTE STARTER PACK (10MG  AND 5MG ) Take as directed on package: start with two-5mg  tablets twice daily for 7 days. On day 8, switch to one-5mg  tablet twice daily. 1 each 0  . aspirin EC 81 MG tablet Take 1 tablet (81 mg total) by mouth daily. 150 tablet 2  . atorvastatin  (LIPITOR) 40 MG tablet Take 40 mg by mouth daily.    Marland Kitchen BREO ELLIPTA 200-25 MCG/INH AEPB Inhale 1 puff into the lungs daily.    . clopidogrel (PLAVIX) 75 MG tablet Take 1 tablet (75 mg total) by mouth daily. 30 tablet 11  . HYDROcodone-acetaminophen (NORCO/VICODIN) 5-325 MG tablet Take 1 tablet by mouth every 4 (four) hours as needed. 30 tablet 0  . isosorbide mononitrate (IMDUR) 30 MG 24 hr tablet Take 30 mg by mouth daily.  5  . lisinopril-hydrochlorothiazide (PRINZIDE,ZESTORETIC) 20-25 MG per tablet Take 1 tablet by mouth daily.    . metoprolol succinate (TOPROL-XL) 25 MG 24 hr tablet Take 25 mg by mouth daily.     . naproxen (NAPROSYN) 500 MG tablet Take 1 tablet (500 mg total) by mouth 2 (two) times daily. 30 tablet 0  . sodium chloride (OCEAN) 0.65 % SOLN nasal spray Place 1 spray into both nostrils daily as needed for congestion.    . sulfamethoxazole-trimethoprim (BACTRIM DS) 800-160 MG tablet Take 1 tablet by mouth 2 (two) times daily. 14 tablet 0   No current facility-administered medications on file prior to visit.    There are no Patient Instructions on file for this visit. No follow-ups on file.   Kris Hartmann, NP

## 2021-03-19 DIAGNOSIS — R609 Edema, unspecified: Secondary | ICD-10-CM | POA: Diagnosis not present

## 2021-03-19 DIAGNOSIS — G629 Polyneuropathy, unspecified: Secondary | ICD-10-CM | POA: Diagnosis not present

## 2021-03-19 DIAGNOSIS — I824Y2 Acute embolism and thrombosis of unspecified deep veins of left proximal lower extremity: Secondary | ICD-10-CM | POA: Diagnosis not present

## 2021-03-19 DIAGNOSIS — R600 Localized edema: Secondary | ICD-10-CM

## 2021-03-19 DIAGNOSIS — E7800 Pure hypercholesterolemia, unspecified: Secondary | ICD-10-CM | POA: Diagnosis not present

## 2021-03-19 HISTORY — DX: Localized edema: R60.0

## 2021-05-24 DIAGNOSIS — J449 Chronic obstructive pulmonary disease, unspecified: Secondary | ICD-10-CM | POA: Diagnosis not present

## 2021-05-24 DIAGNOSIS — R079 Chest pain, unspecified: Secondary | ICD-10-CM | POA: Diagnosis not present

## 2021-05-24 DIAGNOSIS — Z720 Tobacco use: Secondary | ICD-10-CM | POA: Diagnosis not present

## 2021-05-24 DIAGNOSIS — I2510 Atherosclerotic heart disease of native coronary artery without angina pectoris: Secondary | ICD-10-CM | POA: Diagnosis not present

## 2021-05-28 ENCOUNTER — Other Ambulatory Visit (INDEPENDENT_AMBULATORY_CARE_PROVIDER_SITE_OTHER): Payer: Self-pay | Admitting: Nurse Practitioner

## 2021-05-28 DIAGNOSIS — I739 Peripheral vascular disease, unspecified: Secondary | ICD-10-CM

## 2021-05-28 DIAGNOSIS — Z9889 Other specified postprocedural states: Secondary | ICD-10-CM

## 2021-05-29 ENCOUNTER — Ambulatory Visit (INDEPENDENT_AMBULATORY_CARE_PROVIDER_SITE_OTHER): Payer: 59 | Admitting: Vascular Surgery

## 2021-05-29 ENCOUNTER — Encounter (INDEPENDENT_AMBULATORY_CARE_PROVIDER_SITE_OTHER): Payer: Self-pay | Admitting: Vascular Surgery

## 2021-05-29 ENCOUNTER — Other Ambulatory Visit: Payer: Self-pay

## 2021-05-29 ENCOUNTER — Ambulatory Visit (INDEPENDENT_AMBULATORY_CARE_PROVIDER_SITE_OTHER): Payer: 59

## 2021-05-29 VITALS — BP 116/75 | HR 73 | Resp 16 | Wt 171.8 lb

## 2021-05-29 DIAGNOSIS — I70229 Atherosclerosis of native arteries of extremities with rest pain, unspecified extremity: Secondary | ICD-10-CM

## 2021-05-29 DIAGNOSIS — Z9889 Other specified postprocedural states: Secondary | ICD-10-CM | POA: Diagnosis not present

## 2021-05-29 DIAGNOSIS — E7800 Pure hypercholesterolemia, unspecified: Secondary | ICD-10-CM | POA: Diagnosis not present

## 2021-05-29 DIAGNOSIS — I739 Peripheral vascular disease, unspecified: Secondary | ICD-10-CM

## 2021-05-29 DIAGNOSIS — I10 Essential (primary) hypertension: Secondary | ICD-10-CM

## 2021-05-29 NOTE — Progress Notes (Signed)
MRN : [id number]  Scott [name] is a 61 y.o. (12-14-1960) male who presents with chief complaint of  Chief Complaint  Patient presents with   Follow-up    Ultrasound follow up  .  History of Present Illness: Patient returns today in follow up of his severe peripheral arterial disease.  He underwent extensive revascularization last year about 3-1/2 months ago.  He still has a lot of neuropathic pain and discoloration in his left foot and lower leg.  This is also associated with swelling which was severe before the procedure and remains noticeable.  No new ulcerations or wounds.  ABIs today are normal at 1.09 on the right and 1.00 on the left with triphasic waveforms and normal digital pressures consistent with no arterial insufficiency  Current Outpatient Medications  Medication Sig Dispense Refill   acetaminophen (TYLENOL) 325 MG tablet Take 650 mg by mouth every 6 (six) hours as needed.     amLODipine (NORVASC) 10 MG tablet Take 10 mg by mouth daily.     APIXABAN (ELIQUIS) VTE STARTER PACK (10MG  AND 5MG ) Take as directed on package: start with two-5mg  tablets twice daily for 7 days. On day 8, switch to one-5mg  tablet twice daily. 1 each 0   aspirin EC 81 MG tablet Take 1 tablet (81 mg total) by mouth daily. (Patient taking differently: Take 162 mg by mouth daily.) 150 tablet 2   atorvastatin (LIPITOR) 40 MG tablet Take 40 mg by mouth daily.     BREO ELLIPTA 200-25 MCG/INH AEPB Inhale 1 puff into the lungs daily.     cholecalciferol (VITAMIN D3) 25 MCG (1000 UNIT) tablet Take 1,000 Units by mouth daily.     clopidogrel (PLAVIX) 75 MG tablet Take 1 tablet (75 mg total) by mouth daily. 30 tablet 11   gabapentin (NEURONTIN) 100 MG capsule Take 1 capsule by mouth 3 (three) times daily.     HYDROcodone-acetaminophen (NORCO/VICODIN) 5-325 MG tablet Take 1 tablet by mouth every 4 (four) hours as needed. 30 tablet 0   isosorbide mononitrate (IMDUR) 30 MG 24 hr tablet Take 30 mg by mouth daily.   5   lisinopril-hydrochlorothiazide (PRINZIDE,ZESTORETIC) 20-25 MG per tablet Take 1 tablet by mouth daily.     metoprolol succinate (TOPROL-XL) 25 MG 24 hr tablet Take 25 mg by mouth daily.      sodium chloride (OCEAN) 0.65 % SOLN nasal spray Place 1 spray into both nostrils daily as needed for congestion.     torsemide (DEMADEX) 5 MG tablet Take 5 mg by mouth daily.     vitamin B-12 (CYANOCOBALAMIN) 500 MCG tablet Take 500 mcg by mouth daily.     naproxen (NAPROSYN) 500 MG tablet Take 1 tablet (500 mg total) by mouth 2 (two) times daily. (Patient not taking: Reported on 05/29/2021) 30 tablet 0   sulfamethoxazole-trimethoprim (BACTRIM DS) 800-160 MG tablet Take 1 tablet by mouth 2 (two) times daily. (Patient not taking: Reported on 05/29/2021) 14 tablet 0   No current facility-administered medications for this visit.    Past Medical History:  Diagnosis Date   Anxiety    Arthritis    COPD (chronic obstructive pulmonary disease) ([place])    Coronary artery disease    COVID-19 2022   Depression    Dyspnea    Dysrhythmia    Hypertension    Pre-diabetes     Past Surgical History:  Procedure Laterality Date   CHOLECYSTECTOMY     CHOLECYSTECTOMY  2000   ENDARTERECTOMY FEMORAL  Bilateral 12/29/2020   Procedure: ENDARTERECTOMY FEMORAL;  Surgeon: Algernon Huxley, MD;  Location: [facility];  Service: Vascular;  Laterality: Bilateral;  Left SFA and Tibial intervention   LEFT HEART CATH AND CORONARY ANGIOGRAPHY Left 06/03/2017   Procedure: LEFT HEART CATH AND CORONARY ANGIOGRAPHY;  Surgeon: Yolonda Kida, MD;  Location: [facility];  Service: Cardiovascular;  Laterality: Left;   LOWER EXTREMITY ANGIOGRAPHY Left 02/03/2020   Procedure: LOWER EXTREMITY ANGIOGRAPHY;  Surgeon: Algernon Huxley, MD;  Location: [facility];  Service: Cardiovascular;  Laterality: Left;   TONSILLECTOMY       Social History   Tobacco Use   Smoking status: Every Day    Packs/day: 1.00    Types: Cigarettes    Smokeless tobacco: Never  Substance Use Topics   Alcohol use: Yes    Comment: 3-4 beers per day   Drug use: No      Family History  Problem Relation Age of Onset   Diabetes Mother    Hypertension Mother    Lupus Mother    Heart attack Father    Cancer Brother    Cancer Brother      Allergies  Allergen Reactions   Acetaminophen-Codeine Other (See Comments)    Other reaction(s): Other (See Comments) Nausea and "hot flash" Nausea and "hot flash"    Codeine Nausea Only   Varenicline Nausea Only    Sour taste in mouth     REVIEW OF SYSTEMS (Negative unless checked)  Constitutional: [] Weight loss  [] Fever  [] Chills Cardiac: [] Chest pain   [] Chest pressure   [] Palpitations   [] Shortness of breath when laying flat   [] Shortness of breath at rest   [] Shortness of breath with exertion. Vascular:  [x] Pain in legs with walking   [] Pain in legs at rest   [] Pain in legs when laying flat   [] Claudication   [] Pain in feet when walking  [x] Pain in feet at rest  [] Pain in feet when laying flat   [] History of DVT   [] Phlebitis   [] Swelling in legs   [] Varicose veins   [] Non-healing ulcers Pulmonary:   [] Uses home oxygen   [] Productive cough   [] Hemoptysis   [] Wheeze  [] COPD   [] Asthma Neurologic:  [] Dizziness  [] Blackouts   [] Seizures   [] History of stroke   [] History of TIA  [] Aphasia   [] Temporary blindness   [] Dysphagia   [] Weakness or numbness in arms   [x] Weakness or numbness in legs Musculoskeletal:  [x] Arthritis   [] Joint swelling   [] Joint pain   [] Low back pain Hematologic:  [] Easy bruising  [] Easy bleeding   [] Hypercoagulable state   [] Anemic   Gastrointestinal:  [] Blood in stool   [] Vomiting blood  [] Gastroesophageal reflux/heartburn   [] Abdominal pain Genitourinary:  [] Chronic kidney disease   [] Difficult urination  [] Frequent urination  [] Burning with urination   [] Hematuria Skin:  [] Rashes   [] Ulcers   [] Wounds Psychological:  [] History of anxiety   []  History of major  depression.  Physical Examination  BP 116/75 (BP Location: Right Arm)    Pulse 73    Resp 16    Wt 171 lb 12.8 oz (77.9 kg)    BMI 24.65 kg/m  Gen:  WD/WN, NAD Head: [place]/AT, No temporalis wasting. Ear/Nose/Throat: Hearing grossly intact, nares w/o erythema or drainage Eyes: Conjunctiva clear. Sclera non-icteric Neck: Supple.  Trachea midline Pulmonary:  Good air movement, no use of accessory muscles.  Cardiac: RRR, no JVD Vascular:  Vessel Right Left  Radial Palpable Palpable                          PT Palpable Palpable  DP Palpable Palpable   Gastrointestinal: soft, non-tender/non-distended. No guarding/reflex.  Musculoskeletal: M/S 5/5 throughout.  No deformity or atrophy.  Trace right lower extremity edema, 1-2+ left lower extremity edema. Neurologic: Sensation grossly intact in extremities.  Symmetrical.  Speech is fluent.  Psychiatric: Judgment intact, Mood & affect appropriate for pt's clinical situation. Dermatologic: No rashes or ulcers noted.  No cellulitis or open wounds.      Labs No results found for this or any previous visit (from the past 2160 hour(s)).  Radiology No results found.  Assessment/Plan  Atherosclerosis of artery of extremity with rest pain (HCC) ABIs today are normal at 1.09 on the right and 1.00 on the left with triphasic waveforms and normal digital pressures consistent with no arterial insufficiency.  His perfusion is now excellent.  He still has a lot of neuropathic pain but he was profoundly ischemic for an extended time prior to his revascularization and presentation.  We will plan on seeing him back in about 3 months with noninvasive studies.  Continue to try to ambulate and exercise is much as possible.  Continue current medical regimen.  Hypertension blood pressure control important in reducing the progression of atherosclerotic disease. On appropriate oral medications.   Hypercholesterolemia lipid control important in reducing  the progression of atherosclerotic disease. Continue statin therapy    Leotis Pain, MD  05/29/2021 1:31 PM    This note was created with Dragon medical transcription system.  Any errors from dictation are purely unintentional

## 2021-05-29 NOTE — Assessment & Plan Note (Signed)
lipid control important in reducing the progression of atherosclerotic disease. Continue statin therapy  

## 2021-05-29 NOTE — Assessment & Plan Note (Signed)
blood pressure control important in reducing the progression of atherosclerotic disease. On appropriate oral medications.  

## 2021-05-29 NOTE — Assessment & Plan Note (Signed)
ABIs today are normal at 1.09 on the right and 1.00 on the left with triphasic waveforms and normal digital pressures consistent with no arterial insufficiency.  His perfusion is now excellent.  He still has a lot of neuropathic pain but he was profoundly ischemic for an extended time prior to his revascularization and presentation.  We will plan on seeing him back in about 3 months with noninvasive studies.  Continue to try to ambulate and exercise is much as possible.  Continue current medical regimen.

## 2021-06-05 DIAGNOSIS — J449 Chronic obstructive pulmonary disease, unspecified: Secondary | ICD-10-CM | POA: Diagnosis not present

## 2021-06-05 DIAGNOSIS — R69 Illness, unspecified: Secondary | ICD-10-CM | POA: Diagnosis not present

## 2021-06-05 DIAGNOSIS — R0609 Other forms of dyspnea: Secondary | ICD-10-CM | POA: Diagnosis not present

## 2021-06-28 DIAGNOSIS — J449 Chronic obstructive pulmonary disease, unspecified: Secondary | ICD-10-CM | POA: Diagnosis not present

## 2021-06-28 DIAGNOSIS — Z87891 Personal history of nicotine dependence: Secondary | ICD-10-CM | POA: Diagnosis not present

## 2021-06-28 DIAGNOSIS — R0602 Shortness of breath: Secondary | ICD-10-CM | POA: Diagnosis not present

## 2021-07-03 ENCOUNTER — Other Ambulatory Visit: Payer: Self-pay | Admitting: *Deleted

## 2021-07-03 DIAGNOSIS — Z87891 Personal history of nicotine dependence: Secondary | ICD-10-CM

## 2021-07-03 DIAGNOSIS — F17210 Nicotine dependence, cigarettes, uncomplicated: Secondary | ICD-10-CM

## 2021-07-10 ENCOUNTER — Ambulatory Visit (INDEPENDENT_AMBULATORY_CARE_PROVIDER_SITE_OTHER): Payer: 59 | Admitting: Acute Care

## 2021-07-10 ENCOUNTER — Other Ambulatory Visit: Payer: Self-pay

## 2021-07-10 ENCOUNTER — Ambulatory Visit
Admission: RE | Admit: 2021-07-10 | Discharge: 2021-07-10 | Disposition: A | Discharge status: Home / Self Care | Payer: 59 | Source: Ambulatory Visit | Attending: Acute Care | Admitting: Acute Care

## 2021-07-10 ENCOUNTER — Encounter: Payer: Self-pay | Admitting: Acute Care

## 2021-07-10 DIAGNOSIS — F17210 Nicotine dependence, cigarettes, uncomplicated: Secondary | ICD-10-CM

## 2021-07-10 DIAGNOSIS — Z87891 Personal history of nicotine dependence: Secondary | ICD-10-CM

## 2021-07-10 DIAGNOSIS — R69 Illness, unspecified: Secondary | ICD-10-CM | POA: Diagnosis not present

## 2021-07-10 NOTE — Progress Notes (Signed)
Virtual Visit via Telephone Note ? ?I connected with Scott [name] on 07/10/21 at 10:30 AM EDT by telephone and verified that I am speaking with the correct person using two identifiers. ? ?Location: ?Patient:  At home ?Provider: [address] [place], [place], [address]  ?  ?I discussed the limitations, risks, security and privacy concerns of performing an evaluation and management service by telephone and the availability of in person appointments. I also discussed with the patient that there may be a patient responsible charge related to this service. The patient expressed understanding and agreed to proceed. ? ? ?Shared Decision Making Visit Lung Cancer Screening Program ?[phone]) ? ? ?Eligibility: ?Age 61 y.o. ?Pack Years Smoking History Calculation 22 pack year smoking history ?(# packs/per year x # years smoked) ?Recent History of coughing up blood  no ?Unexplained weight loss? no ?( >Than 15 pounds within the last 6 months ) ?Prior History Lung / other cancer no ?(Diagnosis within the last 5 years already requiring surveillance chest CT Scans). ?Smoking Status Current Smoker ?Former Smokers: Years since quit:  NA ? Quit Date:  NA ? ?Visit Components: ?Discussion included one or more decision making aids. yes ?Discussion included risk/benefits of screening. yes ?Discussion included potential follow up diagnostic testing for abnormal scans. yes ?Discussion included meaning and risk of over diagnosis. yes ?Discussion included meaning and risk of False Positives. yes ?Discussion included meaning of total radiation exposure. yes ? ?Counseling Included: ?Importance of adherence to annual lung cancer LDCT screening. yes ?Impact of comorbidities on ability to participate in the program. yes ?Ability and willingness to under diagnostic treatment. yes ? ?Smoking Cessation Counseling: ?Current Smokers:  ?Discussed importance of smoking cessation. yes ?Information about tobacco cessation classes and interventions  provided to patient. yes ?Patient provided with "ticket" for LDCT Scan. yes ?Symptomatic Patient. no ? Counseling NA ?Diagnosis Code: Tobacco Use Z72.0 ?Asymptomatic Patient yes ? Counseling (Intermediate counseling: > three minutes counseling) I9485 ?Former Smokers:  ?Discussed the importance of maintaining cigarette abstinence. yes ?Diagnosis Code: Personal History of Nicotine Dependence. I62.703 ?Information about tobacco cessation classes and interventions provided to patient. Yes ?Patient provided with "ticket" for LDCT Scan. yes ?Written Order for Lung Cancer Screening with LDCT placed in Epic. Yes ?(CT Chest Lung Cancer Screening Low Dose W/O CM) [id number] ?Z12.2-Screening of respiratory organs ?Z87.891-Personal history of nicotine dependence ? ?I have spent 25 minutes of face to face/ virtual visit   time with  [name] discussing the risks and benefits of lung cancer screening. We viewed / discussed a power point together that explained in detail the above noted topics. We paused at intervals to allow for questions to be asked and answered to ensure understanding.We discussed that the single most powerful action that he can take to decrease his risk of developing lung cancer is to quit smoking. We discussed whether or not he is ready to commit to setting a quit date. We discussed options for tools to aid in quitting smoking including nicotine replacement therapy, non-nicotine medications, support groups, Quit Smart classes, and behavior modification. We discussed that often times setting smaller, more achievable goals, such as eliminating 1 cigarette a day for a week and then 2 cigarettes a day for a week can be helpful in slowly decreasing the number of cigarettes smoked. This allows for a sense of accomplishment as well as providing a clinical benefit. I provided  him  with smoking cessation  information  with contact information for community resources, classes, free  nicotine replacement therapy, and  access to mobile apps, text messaging, and on-line smoking cessation help. I have also provided  him  the office contact information in the event he needs to contact me, or the screening staff. We discussed the time and location of the scan, and that either Abigail Miyamoto RN, Karlton Lemon, RN  or I will call / send a letter with the results within 24-72 hours of receiving them. The patient verbalized understanding of all of  the above and had no further questions upon leaving the office. They have my contact information in the event they have any further questions. ? ?I spent 4 minutes counseling on smoking cessation and the health risks of continued tobacco abuse. ? ?I explained to the patient that there has been a high incidence of coronary artery disease noted on these exams. I explained that this is a non-gated exam therefore degree or severity cannot be determined. This patient is on statin therapy. I have asked the patient to follow-up with their PCP regarding any incidental finding of coronary artery disease and management with diet or medication as their PCP  feels is clinically indicated. The patient verbalized understanding of the above and had no further questions upon completion of the visit. ? ?  ? ? ?Bevelyn Ngo, NP ?07/10/2021 ? ? ? ? ? ? ?

## 2021-07-10 NOTE — Patient Instructions (Signed)
Thank you for participating in the [facility]. °It was our pleasure to meet you today. °We will call you with the results of your scan within the next few days. °Your scan will be assigned a Lung RADS category score by the physicians reading the scans.  °This Lung RADS score determines follow up scanning.  °See below for description of categories, and follow up screening recommendations. °We will be in touch to schedule your follow up screening annually or based on recommendations of our providers. °We will fax a copy of your scan results to your Primary Care Physician, or the physician who referred you to the program, to ensure they have the results. °Please call the office if you have any questions or concerns regarding your scanning experience or results.  °Our office number is [phone]. °Please speak with Denise Phelps, RN. She is our Lung Cancer Screening RN. °If she is unavailable when you call, please have the office staff send her a message. She will return your call at her earliest convenience. °Remember, if your scan is normal, we will scan you annually as long as you continue to meet the criteria for the program. (Age 55-77, Current smoker or smoker who has quit within the last 15 years). °If you are a smoker, remember, quitting is the single most powerful action that you can take to decrease your risk of lung cancer and other pulmonary, breathing related problems. °We know quitting is hard, and we are here to help.  °Please let us know if there is anything we can do to help you meet your goal of quitting. °If you are a former smoker, congratulations. We are proud of you! Remain smoke free! °Remember you can refer friends or family members through the number above.  °We will screen them to make sure they meet criteria for the program. °Thank you for helping us take better care of you by participating in Lung Screening. ° °You can receive free nicotine replacement therapy  ( patches, gum or mints) by calling 1-800-QUIT NOW. Please call so we can get you on the path to becoming  a non-smoker. I know it is hard, but you can do this! ° °Lung RADS Categories: ° °Lung RADS 1: no nodules or definitely non-concerning nodules.  °Recommendation is for a repeat annual scan in 12 months. ° °Lung RADS 2:  nodules that are non-concerning in appearance and behavior with a very low likelihood of becoming an active cancer. °Recommendation is for a repeat annual scan in 12 months. ° °Lung RADS 3: nodules that are probably non-concerning , includes nodules with a low likelihood of becoming an active cancer.  Recommendation is for a 6-month repeat screening scan. Often noted after an upper respiratory illness. We will be in touch to make sure you have no questions, and to schedule your 6-month scan. ° °Lung RADS 4 A: nodules with concerning findings, recommendation is most often for a follow up scan in 3 months or additional testing based on our provider's assessment of the scan. We will be in touch to make sure you have no questions and to schedule the recommended 3 month follow up scan. ° °Lung RADS 4 B:  indicates findings that are concerning. We will be in touch with you to schedule additional diagnostic testing based on our provider's  assessment of the scan. ° °Hypnosis for smoking cessation  °[employer] °[phone] ° °Acupuncture for smoking cessation  °[facility] °[phone]  °

## 2021-07-12 ENCOUNTER — Telehealth: Payer: Self-pay | Admitting: Acute Care

## 2021-07-12 ENCOUNTER — Other Ambulatory Visit: Payer: Self-pay

## 2021-07-12 DIAGNOSIS — Z87891 Personal history of nicotine dependence: Secondary | ICD-10-CM

## 2021-07-12 DIAGNOSIS — F17210 Nicotine dependence, cigarettes, uncomplicated: Secondary | ICD-10-CM

## 2021-07-12 NOTE — Telephone Encounter (Signed)
Spoke with patient regarding LDCT results. Reviewed lung nodules were noted as not suspicious for lung cancer.  Other findings were atherosclerosis, emphysema and aortic valve calcifications.  Patient is on statin medication and has follow ups with cardiology and vascular providers.  He has placed a call to his PCP to review results with him also, but has not had a discussion as of this call.  Recommendation for possible further evaluation for aortic valve calcification can be further discussed with his provider.  Patient requested CT results be faxed to PCP, Dr. Festus Barren and Dr. Dorothyann Peng.  This has been completed per patient request.  Patient had no further questions and will follow up with PCP.   ?

## 2021-07-25 DIAGNOSIS — R0789 Other chest pain: Secondary | ICD-10-CM | POA: Diagnosis not present

## 2021-07-25 DIAGNOSIS — R0609 Other forms of dyspnea: Secondary | ICD-10-CM | POA: Diagnosis not present

## 2021-07-25 DIAGNOSIS — J449 Chronic obstructive pulmonary disease, unspecified: Secondary | ICD-10-CM | POA: Diagnosis not present

## 2021-08-01 ENCOUNTER — Other Ambulatory Visit
Admission: RE | Admit: 2021-08-01 | Discharge: 2021-08-01 | Disposition: A | Discharge status: Home / Self Care | Payer: 59 | Source: Ambulatory Visit | Attending: Internal Medicine | Admitting: Internal Medicine

## 2021-08-01 DIAGNOSIS — Z79899 Other long term (current) drug therapy: Secondary | ICD-10-CM | POA: Insufficient documentation

## 2021-08-01 DIAGNOSIS — I2510 Atherosclerotic heart disease of native coronary artery without angina pectoris: Secondary | ICD-10-CM | POA: Diagnosis not present

## 2021-08-01 DIAGNOSIS — R079 Chest pain, unspecified: Secondary | ICD-10-CM | POA: Diagnosis not present

## 2021-08-01 DIAGNOSIS — J449 Chronic obstructive pulmonary disease, unspecified: Secondary | ICD-10-CM | POA: Diagnosis not present

## 2021-08-01 LAB — BRAIN NATRIURETIC PEPTIDE: B Natriuretic Peptide: 64.9 pg/mL (ref 0.0–100.0)

## 2021-08-22 ENCOUNTER — Encounter
Admission: RE | Disposition: A | Discharge status: Home / Self Care | Payer: Self-pay | Source: Home / Self Care | Attending: Internal Medicine

## 2021-08-22 ENCOUNTER — Encounter: Payer: Self-pay | Admitting: Internal Medicine

## 2021-08-22 ENCOUNTER — Ambulatory Visit
Admission: RE | Admit: 2021-08-22 | Discharge: 2021-08-22 | Disposition: A | Discharge status: Home / Self Care | Payer: 59 | Attending: Internal Medicine | Admitting: Internal Medicine

## 2021-08-22 ENCOUNTER — Other Ambulatory Visit: Payer: Self-pay

## 2021-08-22 DIAGNOSIS — I200 Unstable angina: Secondary | ICD-10-CM

## 2021-08-22 DIAGNOSIS — I25110 Atherosclerotic heart disease of native coronary artery with unstable angina pectoris: Secondary | ICD-10-CM | POA: Diagnosis not present

## 2021-08-22 HISTORY — PX: LEFT HEART CATH AND CORONARY ANGIOGRAPHY: CATH118249

## 2021-08-22 LAB — CARDIAC CATHETERIZATION: Cath EF Quantitative: 60 %

## 2021-08-22 SURGERY — LEFT HEART CATH AND CORONARY ANGIOGRAPHY
Anesthesia: Moderate Sedation

## 2021-08-22 MED ORDER — IOHEXOL 300 MG/ML  SOLN
INTRAMUSCULAR | Status: DC | PRN
  Administered 2021-08-22: 95 mL

## 2021-08-22 MED ORDER — LABETALOL HCL 5 MG/ML IV SOLN: 10.0000 mg | INTRAVENOUS | Status: DC | PRN

## 2021-08-22 MED ORDER — VERAPAMIL HCL 2.5 MG/ML IV SOLN
INTRAVENOUS | Status: DC | PRN
  Administered 2021-08-22: 2.5 mg via INTRA_ARTERIAL

## 2021-08-22 MED ORDER — ASPIRIN 81 MG PO CHEW
81.0000 mg | CHEWABLE_TABLET | ORAL | Status: AC
  Administered 2021-08-22: 81 mg via ORAL

## 2021-08-22 MED ORDER — LIDOCAINE HCL (PF) 1 % IJ SOLN
INTRAMUSCULAR | Status: DC | PRN
  Administered 2021-08-22: 2 mL

## 2021-08-22 MED ORDER — MIDAZOLAM HCL 2 MG/2ML IJ SOLN
INTRAMUSCULAR | Status: AC
  Filled 2021-08-22: qty 2

## 2021-08-22 MED ORDER — SODIUM CHLORIDE 0.9 % WEIGHT BASED INFUSION
1.0000 mL/kg/h | INTRAVENOUS | Status: DC
  Administered 2021-08-22: 1 mL/kg/h via INTRAVENOUS

## 2021-08-22 MED ORDER — FENTANYL CITRATE (PF) 100 MCG/2ML IJ SOLN
INTRAMUSCULAR | Status: DC | PRN
  Administered 2021-08-22 (×2): 25 ug via INTRAVENOUS

## 2021-08-22 MED ORDER — LIDOCAINE HCL 1 % IJ SOLN
INTRAMUSCULAR | Status: AC
  Filled 2021-08-22: qty 20

## 2021-08-22 MED ORDER — FENTANYL CITRATE (PF) 100 MCG/2ML IJ SOLN
INTRAMUSCULAR | Status: AC
  Filled 2021-08-22: qty 2

## 2021-08-22 MED ORDER — SODIUM CHLORIDE 0.9 % WEIGHT BASED INFUSION: 1.0000 mL/kg/h | INTRAVENOUS | Status: DC

## 2021-08-22 MED ORDER — SODIUM CHLORIDE 0.9% FLUSH: 3.0000 mL | Freq: Two times a day (BID) | INTRAVENOUS | Status: DC

## 2021-08-22 MED ORDER — HYDRALAZINE HCL 20 MG/ML IJ SOLN: 10.0000 mg | INTRAMUSCULAR | Status: DC | PRN

## 2021-08-22 MED ORDER — HEPARIN SODIUM (PORCINE) 1000 UNIT/ML IJ SOLN
INTRAMUSCULAR | Status: DC | PRN
  Administered 2021-08-22: 4000 [IU] via INTRAVENOUS

## 2021-08-22 MED ORDER — HEPARIN (PORCINE) IN NACL 1000-0.9 UT/500ML-% IV SOLN
INTRAVENOUS | Status: AC
  Filled 2021-08-22: qty 1000

## 2021-08-22 MED ORDER — SODIUM CHLORIDE 0.9 % WEIGHT BASED INFUSION
3.0000 mL/kg/h | INTRAVENOUS | Status: AC
  Administered 2021-08-22: 3 mL/kg/h via INTRAVENOUS

## 2021-08-22 MED ORDER — SODIUM CHLORIDE 0.9 % IV SOLN: 250.0000 mL | INTRAVENOUS | Status: DC | PRN

## 2021-08-22 MED ORDER — SODIUM CHLORIDE 0.9% FLUSH: 3.0000 mL | INTRAVENOUS | Status: DC | PRN

## 2021-08-22 MED ORDER — HEPARIN (PORCINE) IN NACL 1000-0.9 UT/500ML-% IV SOLN
INTRAVENOUS | Status: DC | PRN
  Administered 2021-08-22 (×2): 500 mL

## 2021-08-22 MED ORDER — ONDANSETRON HCL 4 MG/2ML IJ SOLN: 4.0000 mg | Freq: Four times a day (QID) | INTRAMUSCULAR | Status: DC | PRN

## 2021-08-22 MED ORDER — ASPIRIN 81 MG PO CHEW
CHEWABLE_TABLET | ORAL | Status: AC
  Filled 2021-08-22: qty 1

## 2021-08-22 MED ORDER — MIDAZOLAM HCL 2 MG/2ML IJ SOLN
INTRAMUSCULAR | Status: DC | PRN
  Administered 2021-08-22 (×2): 1 mg via INTRAVENOUS

## 2021-08-22 MED ORDER — HEPARIN SODIUM (PORCINE) 1000 UNIT/ML IJ SOLN
INTRAMUSCULAR | Status: AC
  Filled 2021-08-22: qty 10

## 2021-08-22 MED ORDER — VERAPAMIL HCL 2.5 MG/ML IV SOLN
INTRAVENOUS | Status: AC
  Filled 2021-08-22: qty 2

## 2021-08-22 SURGICAL SUPPLY — 10 items
CATH 5FR JL3.5 JR4 ANG PIG MP (CATHETERS) ×1 IMPLANT
DEVICE RAD TR BAND REGULAR (VASCULAR PRODUCTS) ×1 IMPLANT
DRAPE BRACHIAL (DRAPES) ×1 IMPLANT
GLIDESHEATH SLEND SS 6F .021 (SHEATH) ×1 IMPLANT
GUIDEWIRE INQWIRE 1.5J.035X260 (WIRE) IMPLANT
INQWIRE 1.5J .035X260CM (WIRE) ×2
PACK CARDIAC CATH (CUSTOM PROCEDURE TRAY) ×2 IMPLANT
PROTECTION STATION PRESSURIZED (MISCELLANEOUS) ×2
SET ATX SIMPLICITY (MISCELLANEOUS) ×1 IMPLANT
STATION PROTECTION PRESSURIZED (MISCELLANEOUS) IMPLANT

## 2021-08-23 ENCOUNTER — Encounter: Payer: Self-pay | Admitting: Internal Medicine

## 2021-09-06 DIAGNOSIS — I779 Disorder of arteries and arterioles, unspecified: Secondary | ICD-10-CM | POA: Diagnosis not present

## 2021-09-06 DIAGNOSIS — R69 Illness, unspecified: Secondary | ICD-10-CM | POA: Diagnosis not present

## 2021-09-06 DIAGNOSIS — J449 Chronic obstructive pulmonary disease, unspecified: Secondary | ICD-10-CM | POA: Diagnosis not present

## 2021-09-06 DIAGNOSIS — I471 Supraventricular tachycardia: Secondary | ICD-10-CM | POA: Diagnosis not present

## 2021-09-06 DIAGNOSIS — Z95828 Presence of other vascular implants and grafts: Secondary | ICD-10-CM | POA: Diagnosis not present

## 2021-09-06 DIAGNOSIS — Z720 Tobacco use: Secondary | ICD-10-CM | POA: Diagnosis not present

## 2021-09-06 DIAGNOSIS — Z8679 Personal history of other diseases of the circulatory system: Secondary | ICD-10-CM | POA: Diagnosis not present

## 2021-09-06 DIAGNOSIS — Z9889 Other specified postprocedural states: Secondary | ICD-10-CM | POA: Diagnosis not present

## 2021-10-25 ENCOUNTER — Telehealth (INDEPENDENT_AMBULATORY_CARE_PROVIDER_SITE_OTHER): Payer: Self-pay

## 2021-10-25 NOTE — Telephone Encounter (Signed)
Patient left a voicemail stating that he was having a lot pain with lower abdomen and back. Also he is having swelling in his groin area. Patient requested to move up his appointment to a sooner date. Patient was advise to go the ED by Vivia Birmingham NP but patient did not wish to go the ED despite being advise to go multiple times.Scott [name]

## 2021-10-31 DIAGNOSIS — R0609 Other forms of dyspnea: Secondary | ICD-10-CM | POA: Diagnosis not present

## 2021-10-31 DIAGNOSIS — J449 Chronic obstructive pulmonary disease, unspecified: Secondary | ICD-10-CM | POA: Diagnosis not present

## 2021-11-06 ENCOUNTER — Other Ambulatory Visit (INDEPENDENT_AMBULATORY_CARE_PROVIDER_SITE_OTHER): Payer: Self-pay | Admitting: Vascular Surgery

## 2021-11-06 DIAGNOSIS — Z9889 Other specified postprocedural states: Secondary | ICD-10-CM

## 2021-11-07 ENCOUNTER — Encounter (INDEPENDENT_AMBULATORY_CARE_PROVIDER_SITE_OTHER): Payer: Self-pay | Admitting: Nurse Practitioner

## 2021-11-07 ENCOUNTER — Ambulatory Visit (INDEPENDENT_AMBULATORY_CARE_PROVIDER_SITE_OTHER): Payer: 59

## 2021-11-07 ENCOUNTER — Encounter (INDEPENDENT_AMBULATORY_CARE_PROVIDER_SITE_OTHER): Payer: Self-pay | Admitting: *Deleted

## 2021-11-07 ENCOUNTER — Ambulatory Visit (INDEPENDENT_AMBULATORY_CARE_PROVIDER_SITE_OTHER): Payer: 59 | Admitting: Nurse Practitioner

## 2021-11-07 VITALS — BP 138/89 | HR 70 | Resp 16 | Wt 173.0 lb

## 2021-11-07 DIAGNOSIS — I739 Peripheral vascular disease, unspecified: Secondary | ICD-10-CM | POA: Diagnosis not present

## 2021-11-07 DIAGNOSIS — I714 Abdominal aortic aneurysm, without rupture, unspecified: Secondary | ICD-10-CM

## 2021-11-07 DIAGNOSIS — F17210 Nicotine dependence, cigarettes, uncomplicated: Secondary | ICD-10-CM

## 2021-11-07 DIAGNOSIS — R69 Illness, unspecified: Secondary | ICD-10-CM | POA: Diagnosis not present

## 2021-11-07 DIAGNOSIS — Z9889 Other specified postprocedural states: Secondary | ICD-10-CM

## 2021-11-07 DIAGNOSIS — I10 Essential (primary) hypertension: Secondary | ICD-10-CM | POA: Diagnosis not present

## 2021-11-07 DIAGNOSIS — I70229 Atherosclerosis of native arteries of extremities with rest pain, unspecified extremity: Secondary | ICD-10-CM

## 2021-11-07 DIAGNOSIS — I7143 Infrarenal abdominal aortic aneurysm, without rupture: Secondary | ICD-10-CM

## 2021-11-07 NOTE — Progress Notes (Signed)
Subjective:    Patient ID: Scott [name], male    DOB: [birth year], 61 y.o.   MRN: [id number] Chief Complaint  Patient presents with   Follow-up    Ultrasound follow up    Mild to moderate is a 61 year old male who presents today for early follow-up due to having pain in his lower back in his right groin.  He notes that at times it makes it hard for him to walk.  He notes that the pain is much worse when he strains and does things such as coughing.  He notes that it is severe enough to make him stop when he is doing.  Pain is also severe enough that it stops his ability to go to work on a daily basis as his job is very physical.  He denies any open wounds or ulcerations.  Denies any issues with blood pressure.  He is denies any obvious signs of distal embolization.  Today the patient's abdominal aortic aneurysm measures 3.8 cm.  The patient has an ABI of 1.17 on the right and 1.10 on the left.  He has primarily triphasic tibial artery waveforms with normal toe waveforms it is noted that the aorta has a large amount of thrombus with greater than 50% stenosis in the distal aorta.  Previous aortic measurement was 3.7 cm.  That was approximately 1 year ago.    Review of Systems  Gastrointestinal:  Positive for abdominal pain.  Musculoskeletal:  Positive for back pain and gait problem.  All other systems reviewed and are negative.      Objective:   Physical Exam Vitals reviewed.  HENT:     Head: Normocephalic.  Cardiovascular:     Rate and Rhythm: Normal rate.     Pulses: Normal pulses.  Pulmonary:     Effort: Pulmonary effort is normal.  Skin:    General: Skin is warm and dry.  Neurological:     Mental Status: He is alert and oriented to person, place, and time.  Psychiatric:        Mood and Affect: Mood normal.        Behavior: Behavior normal.        Thought Content: Thought content normal.        Judgment: Judgment normal.     BP 138/89 (BP Location: Right Arm)   Pulse  70   Resp 16   Wt 173 lb (78.5 kg)   BMI 24.82 kg/m   Past Medical History:  Diagnosis Date   Anxiety    Arthritis    COPD (chronic obstructive pulmonary disease) (HCC)    Coronary artery disease    COVID-19 2022   Depression    Dyspnea    Dysrhythmia    Hypertension    Pre-diabetes     Social History   Socioeconomic History   Marital status: Divorced    Spouse name: Not on file   Number of children: Not on file   Years of education: Not on file   Highest education level: Not on file  Occupational History   Not on file  Tobacco Use   Smoking status: Every Day    Packs/day: 1.00    Types: Cigarettes   Smokeless tobacco: Never  Substance and Sexual Activity   Alcohol use: Yes    Comment: 3-4 beers per day   Drug use: No   Sexual activity: Not on file  Other Topics Concern   Not on file  Social History Narrative  Lives with sister   Social Determinants of Health   Financial Resource Strain: Not on file  Food Insecurity: Not on file  Transportation Needs: Not on file  Physical Activity: Not on file  Stress: Not on file  Social Connections: Not on file  Intimate Partner Violence: Not on file    Past Surgical History:  Procedure Laterality Date   CHOLECYSTECTOMY     CHOLECYSTECTOMY  2000   ENDARTERECTOMY FEMORAL Bilateral 12/29/2020   Procedure: ENDARTERECTOMY FEMORAL;  Surgeon: Annice Needy, MD;  Location: [facility];  Service: Vascular;  Laterality: Bilateral;  Left SFA and Tibial intervention   LEFT HEART CATH AND CORONARY ANGIOGRAPHY Left 06/03/2017   Procedure: LEFT HEART CATH AND CORONARY ANGIOGRAPHY;  Surgeon: Alwyn Pea, MD;  Location: [facility];  Service: Cardiovascular;  Laterality: Left;   LEFT HEART CATH AND CORONARY ANGIOGRAPHY N/A 08/22/2021   Procedure: LEFT HEART CATH AND CORONARY ANGIOGRAPHY;  Surgeon: Alwyn Pea, MD;  Location: [facility];  Service: Cardiovascular;  Laterality: N/A;   LOWER EXTREMITY  ANGIOGRAPHY Left 02/03/2020   Procedure: LOWER EXTREMITY ANGIOGRAPHY;  Surgeon: Annice Needy, MD;  Location: [facility];  Service: Cardiovascular;  Laterality: Left;   TONSILLECTOMY      Family History  Problem Relation Age of Onset   Diabetes Mother    Hypertension Mother    Lupus Mother    Heart attack Father    Cancer Brother    Cancer Brother     Allergies  Allergen Reactions   Acetaminophen-Codeine Other (See Comments)    Nausea and "hot flash"    Chantix [Varenicline] Nausea Only    Sour taste in mouth   Codeine Nausea Only       Latest Ref Rng & Units 01/07/2021    9:28 AM 12/29/2020   11:49 PM 12/29/2020    3:16 PM  CBC  WBC 4.0 - 10.5 K/uL 10.1  15.4  21.0   Hemoglobin 13.0 - 17.0 g/dL 16.1  09.6  04.5   Hematocrit 39.0 - 52.0 % 32.5  29.6  32.9   Platelets 150 - 400 K/uL 401  240  256       CMP     Component Value Date/Time   NA 127 (L) 01/07/2021 0928   K 4.0 01/07/2021 0928   CL 89 (L) 01/07/2021 0928   CO2 29 01/07/2021 0928   GLUCOSE 132 (H) 01/07/2021 0928   BUN 9 01/07/2021 0928   CREATININE 0.67 01/07/2021 0928   CALCIUM 8.5 (L) 01/07/2021 0928   PROT 6.7 01/07/2021 0928   ALBUMIN 3.5 01/07/2021 0928   AST 27 01/07/2021 0928   ALT 27 01/07/2021 0928   ALKPHOS 78 01/07/2021 0928   BILITOT 1.0 01/07/2021 0928   GFRNONAA >60 01/07/2021 0928   GFRAA >60 06/03/2017 1331     No results found.     Assessment & Plan:   1. 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  His noninvasive study showed his aneurysm is only at 3.8 cm at its largest.  However, there does note to be significant thrombus within the aorta and ultrasound is not able to determine if this is flow-limiting at all.  Based on this we will plan on a CT a as noted below.  2. Cigarette smoker Smoking cessation was discussed, 3-10 minutes spent on this topic  specifically  3. Hypertension, unspecified type Continue antihypertensive medications as already ordered, these medications have been reviewed and there are no changes at this time.   4. Atherosclerosis of artery of extremity with rest pain (HCC) While the patient's aneurysm is a stable size.  There is concern that there is significant thrombus noted within his distal aorta that is causing a greater than 50% stenosis.  Is possible this is the cause of the patient's groin pain however his lower back pain is likely not related to this.  We will have the patient undergo a CT scan in order to determine if this is possibly flow-limiting and if repair of the aorta is necessary due to thrombus. - CT ANGIO AO+BIFEM W & OR WO CONTRAST; Future   Current Outpatient Medications on File Prior to Visit  Medication Sig Dispense Refill   amLODipine (NORVASC) 10 MG tablet Take 10 mg by mouth daily.     apixaban (ELIQUIS) 5 MG TABS tablet Take 5 mg by mouth 2 (two) times daily.     atorvastatin (LIPITOR) 40 MG tablet Take 40 mg by mouth daily.     BREO ELLIPTA 200-25 MCG/INH AEPB Inhale 1 puff into the lungs daily.     Carboxymethylcellul-Glycerin (CLEAR EYES FOR DRY EYES) 1-0.25 % SOLN Place 1 drop into both eyes daily.     cholecalciferol (VITAMIN D3) 25 MCG (1000 UNIT) tablet Take 1,000 Units by mouth daily.     clopidogrel (PLAVIX) 75 MG tablet Take 1 tablet (75 mg total) by mouth daily. 30 tablet 11   gabapentin (NEURONTIN) 100 MG capsule Take 100 mg by mouth 3 (three) times daily.     isosorbide mononitrate (IMDUR) 30 MG 24 hr tablet Take 30 mg by mouth daily.  5   lisinopril-hydrochlorothiazide (ZESTORETIC) 20-12.5 MG tablet Take 1 tablet by mouth daily.     metoprolol succinate (TOPROL-XL) 25 MG 24 hr tablet Take 25 mg by mouth daily.      nicotine (NICODERM CQ - DOSED IN MG/24 HOURS) 21 mg/24hr patch Place 21 mg onto the skin daily.     sodium chloride (OCEAN) 0.65 % SOLN nasal spray Place 1 spray  into both nostrils daily as needed for congestion.     torsemide (DEMADEX) 5 MG tablet Take 5 mg by mouth daily.     vitamin B-12 (CYANOCOBALAMIN) 500 MCG tablet Take 500 mcg by mouth daily.     No current facility-administered medications on file prior to visit.    There are no Patient Instructions on file for this visit. No follow-ups on file.   Georgiana Spinner, NP

## 2021-11-08 ENCOUNTER — Encounter (INDEPENDENT_AMBULATORY_CARE_PROVIDER_SITE_OTHER): Payer: Self-pay | Admitting: Nurse Practitioner

## 2021-11-09 ENCOUNTER — Telehealth (INDEPENDENT_AMBULATORY_CARE_PROVIDER_SITE_OTHER): Payer: Self-pay | Admitting: Vascular Surgery

## 2021-11-09 NOTE — Telephone Encounter (Signed)
Spoke with pt to advise that CT had been authorized. Gave pt [phone] for scheduling and advised him to call us back to make appt with Dr. Wyn Quaker for CT results. Nothing further needed at this time.

## 2021-11-16 ENCOUNTER — Ambulatory Visit
Admission: RE | Admit: 2021-11-16 | Discharge: 2021-11-16 | Disposition: A | Discharge status: Home / Self Care | Payer: 59 | Source: Ambulatory Visit | Attending: Nurse Practitioner | Admitting: Nurse Practitioner

## 2021-11-16 DIAGNOSIS — I513 Intracardiac thrombosis, not elsewhere classified: Secondary | ICD-10-CM | POA: Diagnosis not present

## 2021-11-16 DIAGNOSIS — I70229 Atherosclerosis of native arteries of extremities with rest pain, unspecified extremity: Secondary | ICD-10-CM

## 2021-11-16 DIAGNOSIS — I7410 Embolism and thrombosis of unspecified parts of aorta: Secondary | ICD-10-CM | POA: Diagnosis not present

## 2021-11-16 DIAGNOSIS — S2241XA Multiple fractures of ribs, right side, initial encounter for closed fracture: Secondary | ICD-10-CM | POA: Diagnosis not present

## 2021-11-16 DIAGNOSIS — I714 Abdominal aortic aneurysm, without rupture, unspecified: Secondary | ICD-10-CM | POA: Diagnosis not present

## 2021-11-16 LAB — POCT I-STAT CREATININE: Creatinine, Ser: 0.8 mg/dL (ref 0.61–1.24)

## 2021-11-16 MED ORDER — IOHEXOL 350 MG/ML SOLN
100.0000 mL | Freq: Once | INTRAVENOUS | Status: AC | PRN
  Administered 2021-11-16: 100 mL via INTRAVENOUS

## 2021-11-23 ENCOUNTER — Encounter (INDEPENDENT_AMBULATORY_CARE_PROVIDER_SITE_OTHER): Payer: Self-pay | Admitting: Vascular Surgery

## 2021-11-23 ENCOUNTER — Ambulatory Visit (INDEPENDENT_AMBULATORY_CARE_PROVIDER_SITE_OTHER): Payer: 59 | Admitting: Vascular Surgery

## 2021-11-23 VITALS — BP 155/100 | HR 83 | Resp 16 | Wt 174.2 lb

## 2021-11-23 DIAGNOSIS — I70229 Atherosclerosis of native arteries of extremities with rest pain, unspecified extremity: Secondary | ICD-10-CM | POA: Diagnosis not present

## 2021-11-23 DIAGNOSIS — I10 Essential (primary) hypertension: Secondary | ICD-10-CM

## 2021-11-23 DIAGNOSIS — I7143 Infrarenal abdominal aortic aneurysm, without rupture: Secondary | ICD-10-CM

## 2021-11-23 NOTE — Assessment & Plan Note (Addendum)
I then dependently reviewed his CT angiogram.  His maximal aortic diameter measured approximately 4.2 cm.  At this point, he does not meet threshold size for prophylactic repair but we will want to follow this on 28-month intervals going forward.  I have discussed that this is unlikely to be a cause of any symptoms or problems at this point, but we will continue to monitor this.  I have discussed the primary risk factors for growth would be his continued tobacco use as well as his poorly controlled hypertension.  Return in 6 months with duplex.

## 2021-11-23 NOTE — Assessment & Plan Note (Signed)
ABIs are stable at last check with good perfusion.  Had extensive left lower extremity revascularization about a year ago.  Recheck in 6 months.

## 2021-11-23 NOTE — Progress Notes (Signed)
MRN : [id number]  Scott [name] is a 61 y.o. (08/28/1960) male who presents with chief complaint of  Chief Complaint  Patient presents with   Follow-up    Ct results  .  History of Present Illness: Patient returns today in follow up of his abdominal aortic aneurysm after undergoing a CT angiogram which I have reviewed.  The official report is of a 4.2 cm bilobed infrarenal abdominal aortic aneurysm with a moderate amount of mural thrombus but certainly not flow-limiting.  His flow appears to be well preserved in the aorta and iliac segments as well as the lower extremities.  The flow was confirmed with noninvasive studies several weeks ago.  He continues to have a lot of pain in his right side.  There was no other obvious finding on the CT scan that explain this pain.  He also has neuropathic pain in his left hand and his left foot.  Current Outpatient Medications  Medication Sig Dispense Refill   amLODipine (NORVASC) 10 MG tablet Take 10 mg by mouth daily.     atorvastatin (LIPITOR) 40 MG tablet Take 40 mg by mouth daily.     BREO ELLIPTA 200-25 MCG/INH AEPB Inhale 1 puff into the lungs daily.     Carboxymethylcellul-Glycerin (CLEAR EYES FOR DRY EYES) 1-0.25 % SOLN Place 1 drop into both eyes daily.     cholecalciferol (VITAMIN D3) 25 MCG (1000 UNIT) tablet Take 1,000 Units by mouth daily.     clopidogrel (PLAVIX) 75 MG tablet Take 1 tablet (75 mg total) by mouth daily. 30 tablet 11   gabapentin (NEURONTIN) 100 MG capsule Take 100 mg by mouth 3 (three) times daily.     isosorbide mononitrate (IMDUR) 30 MG 24 hr tablet Take 30 mg by mouth daily.  5   lisinopril-hydrochlorothiazide (ZESTORETIC) 20-12.5 MG tablet Take 1 tablet by mouth daily.     metoprolol succinate (TOPROL-XL) 25 MG 24 hr tablet Take 25 mg by mouth daily.      nicotine (NICODERM CQ - DOSED IN MG/24 HOURS) 21 mg/24hr patch Place 21 mg onto the skin daily.     sodium chloride (OCEAN) 0.65 % SOLN nasal spray Place 1 spray  into both nostrils daily as needed for congestion.     torsemide (DEMADEX) 5 MG tablet Take 5 mg by mouth daily.     vitamin B-12 (CYANOCOBALAMIN) 500 MCG tablet Take 500 mcg by mouth daily.     apixaban (ELIQUIS) 5 MG TABS tablet Take 5 mg by mouth 2 (two) times daily. (Patient not taking: Reported on 11/23/2021)     No current facility-administered medications for this visit.    Past Medical History:  Diagnosis Date   Anxiety    Arthritis    COPD (chronic obstructive pulmonary disease) (HCC)    Coronary artery disease    COVID-19 2022   Depression    Dyspnea    Dysrhythmia    Hypertension    Pre-diabetes     Past Surgical History:  Procedure Laterality Date   CHOLECYSTECTOMY     CHOLECYSTECTOMY  2000   ENDARTERECTOMY FEMORAL Bilateral 12/29/2020   Procedure: ENDARTERECTOMY FEMORAL;  Surgeon: Annice Needy, MD;  Location: [facility];  Service: Vascular;  Laterality: Bilateral;  Left SFA and Tibial intervention   LEFT HEART CATH AND CORONARY ANGIOGRAPHY Left 06/03/2017   Procedure: LEFT HEART CATH AND CORONARY ANGIOGRAPHY;  Surgeon: Alwyn Pea, MD;  Location: [facility];  Service: Cardiovascular;  Laterality: Left;  LEFT HEART CATH AND CORONARY ANGIOGRAPHY N/A 08/22/2021   Procedure: LEFT HEART CATH AND CORONARY ANGIOGRAPHY;  Surgeon: Alwyn Pea, MD;  Location: [facility];  Service: Cardiovascular;  Laterality: N/A;   LOWER EXTREMITY ANGIOGRAPHY Left 02/03/2020   Procedure: LOWER EXTREMITY ANGIOGRAPHY;  Surgeon: Annice Needy, MD;  Location: [facility];  Service: Cardiovascular;  Laterality: Left;   TONSILLECTOMY       Social History   Tobacco Use   Smoking status: Every Day    Packs/day: 1.00    Types: Cigarettes   Smokeless tobacco: Never  Substance Use Topics   Alcohol use: Yes    Comment: 3-4 beers per day   Drug use: No      Family History  Problem Relation Age of Onset   Diabetes Mother    Hypertension Mother    Lupus  Mother    Heart attack Father    Cancer Brother    Cancer Brother      Allergies  Allergen Reactions   Acetaminophen-Codeine Other (See Comments)    Nausea and "hot flash"    Chantix [Varenicline] Nausea Only    Sour taste in mouth   Codeine Nausea Only     REVIEW OF SYSTEMS (Negative unless checked)  Constitutional: [] Weight loss  [] Fever  [] Chills Cardiac: [] Chest pain   [] Chest pressure   [] Palpitations   [] Shortness of breath when laying flat   [] Shortness of breath at rest   [] Shortness of breath with exertion. Vascular:  [] Pain in legs with walking   [x] Pain in legs at rest   [] Pain in legs when laying flat   [] Claudication   [] Pain in feet when walking  [] Pain in feet at rest  [] Pain in feet when laying flat   [] History of DVT   [] Phlebitis   [x] Swelling in legs   [] Varicose veins   [] Non-healing ulcers Pulmonary:   [] Uses home oxygen   [] Productive cough   [] Hemoptysis   [] Wheeze  [x] COPD   [] Asthma Neurologic:  [] Dizziness  [] Blackouts   [] Seizures   [] History of stroke   [] History of TIA  [] Aphasia   [] Temporary blindness   [] Dysphagia   [] Weakness or numbness in arms   [x] Weakness or numbness in legs Musculoskeletal:  [x] Arthritis   [] Joint swelling   [x] Joint pain   [] Low back pain Hematologic:  [] Easy bruising  [] Easy bleeding   [] Hypercoagulable state   [] Anemic   Gastrointestinal:  [] Blood in stool   [] Vomiting blood  [] Gastroesophageal reflux/heartburn   [] Abdominal pain Genitourinary:  [] Chronic kidney disease   [] Difficult urination  [] Frequent urination  [] Burning with urination   [] Hematuria Skin:  [] Rashes   [] Ulcers   [] Wounds Psychological:  [x] History of anxiety   []  History of major depression.  Physical Examination  BP (!) 155/100 (BP Location: Right Arm)   Pulse 83   Resp 16   Wt 174 lb 3.2 oz (79 kg)   BMI 25.00 kg/m  Gen:  WD/WN, NAD Head: [place]/AT, No temporalis wasting. Ear/Nose/Throat: Hearing grossly intact, nares w/o erythema or drainage Eyes:  Conjunctiva clear. Sclera non-icteric Neck: Supple.  Trachea midline Pulmonary:  Good air movement, no use of accessory muscles.  Cardiac: RRR, no JVD Vascular:  Vessel Right Left  Radial Palpable Palpable                          PT Palpable Palpable  DP Palpable Palpable   Gastrointestinal: soft, non-tender/non-distended. No  guarding/reflex.  Musculoskeletal: M/S 5/5 throughout.  No deformity or atrophy. Trace RLE edema, 1-2+ LLE edema. Neurologic: Sensation grossly intact in extremities.  Symmetrical.  Speech is fluent.  Psychiatric: Judgment intact, Mood & affect appropriate for pt's clinical situation. Dermatologic: No rashes or ulcers noted.  No cellulitis or open wounds.      Labs Recent Results (from the past 2160 hour(s))  I-STAT creatinine     Status: None   Collection Time: 11/16/21  1:06 PM  Result Value Ref Range   Creatinine, Ser 0.80 0.61 - 1.24 mg/dL    Radiology CT ANGIO AO+BIFEM W & OR WO CONTRAST  Result Date: 11/16/2021 CLINICAL DATA:  Abdominal aortic aneurysm, lower extremity stents EXAM: CT ANGIOGRAPHY OF ABDOMINAL AORTA WITH ILIOFEMORAL RUNOFF TECHNIQUE: Multidetector CT imaging of the abdomen, pelvis and lower extremities was performed using the standard protocol during bolus administration of intravenous contrast. Multiplanar CT image reconstructions and MIPs were obtained to evaluate the vascular anatomy. RADIATION DOSE REDUCTION: This exam was performed according to the departmental dose-optimization program which includes automated exposure control, adjustment of the mA and/or kV according to patient size and/or use of iterative reconstruction technique. CONTRAST:  OMNIPAQUE IOHEXOL 350 MG/ML SOLN COMPARISON:  Prior CTA runoff 12/18/2020 FINDINGS: VASCULAR Aorta: Bilobed aneurysmal dilation of the abdominal aorta with a maximal transverse diameter of 4.2 cm. Wall adherent mural thrombus is present. Celiac: No significant stenosis, aneurysm or  dissection. Variant hepatic arterial anatomy. The left hepatic artery is replaced to the left gastric artery. The right hepatic artery is replaced to the SMA. SMA: Mild atherosclerotic plaque. No significant stenosis, dissection or aneurysm. Replaced right hepatic artery. Renals: Solitary renal arteries bilaterally. Calcified atherosclerotic plaque at the origin the left renal artery results in moderate stenosis. No significant stenosis present on the right. No changes of fibromuscular dysplasia. IMA: Occluded at the origin. RIGHT Lower Extremity Inflow: Extensive calcified atherosclerotic plaque. No dissection or aneurysm. Moderate focal stenosis of the external iliac artery. Outflow: Surgical changes suggest prior right femoral endarterectomy. Profunda femoral branches are widely patent. Heterogeneous atherosclerotic plaque present in the proximal SFA results in significant focal stenosis. Heavily calcified atherosclerotic plaque in the P3 segment of the popliteal artery results in at least moderate stenosis. Runoff: Multifocal calcified disease throughout the runoff arteries. Patent 3 vessel runoff to the ankle. LEFT Lower Extremity Inflow: Scattered calcified atherosclerotic plaque. Heavily calcified circumferential atherosclerotic plaque results in moderate stenosis of the distal external iliac artery. No dissection or aneurysm. Outflow: Surgical changes suggest prior femoral endarterectomy. Widely patent profunda femoral branches. Long segment stent in the proximal and mid superficial femoral artery remains widely patent. No evidence of in stent stenosis. Calcified atherosclerotic plaque throughout the popliteal artery with probable mild stenosis. Runoff: Scattered calcified atherosclerotic plaque throughout the runoff arteries. Patent 3 vessel runoff to the ankle. Veins: No obvious venous abnormality within the limitations of this arterial phase study. Review of the MIP images confirms the above findings.  NON-VASCULAR Lower chest: No acute abnormality. Hepatobiliary: No focal liver abnormality is seen. Status post cholecystectomy. No biliary dilatation. Pancreas: Unremarkable. No pancreatic ductal dilatation or surrounding inflammatory changes. Spleen: Normal in size without focal abnormality. Adrenals/Urinary Tract: Adrenal glands are unremarkable. Kidneys are normal, without renal calculi, focal lesion, or hydronephrosis. Bladder is unremarkable. Stomach/Bowel: Colonic diverticular disease without CT evidence of active inflammation. No evidence of obstruction or focal bowel wall thickening. Normal appendix in the right lower quadrant. The terminal ileum is unremarkable. Lymphatic: No suspicious lymphadenopathy.  Reproductive: Prostate is unremarkable. Other: No abdominal wall hernia or abnormality. No abdominopelvic ascites. Musculoskeletal: No acute fracture or aggressive appearing lytic or blastic osseous lesion. Remote right-sided rib fractures. IMPRESSION: VASCULAR 1. Evidence of prior bilateral femoral cutdown and endarterectomy without evidence of complication. 2. Widely patent stent system extending through the proximal and mid left superficial femoral artery. 3. Extensive calcified native aortic and peripheral arterial disease as detailed above. Areas of stenosis are present in the bilateral external iliac arteries, right superficial femoral artery, bilateral popliteal arteries and likely the bilateral runoff arteries. 4. Bilobed fusiform abdominal aortic aneurysm measuring up to 4.2 cm in maximal diameter. Recommend follow-up every 12 months and vascular consultation. This recommendation follows ACR consensus guidelines: White Paper of the ACR Incidental Findings Committee II on Vascular Findings. J Am Coll Radiol 2013; 10:789-794. 5. Variant hepatic arterial anatomy as above. 6. Mild to moderate stenosis at the origin of the left renal artery. NON-VASCULAR 1. No acute abnormality. 2. Ancillary findings as  above. Signed, Sterling Big, MD, RPVI [facility] Radiology Electronically Signed   By: Malachy Moan M.D.   On: 11/16/2021 16:00   VAS [place] ABI WITH/WO TBI  Result Date: 11/09/2021 ABDOMINAL AORTA STUDY Patient Name:  VIRGAL WARMUTH  Date of Exam:   11/07/2021 Medical Rec #: [id number]       Accession #:    [id number] Date of Birth: 19-Dec-1960      Patient Gender: M Patient Age:   38 years Exam Location:  [facility] Procedure:      VAS US AORTA/IVC/ILIACS Referring Phys: Levora Dredge --------------------------------------------------------------------------------  Indications: Follow up exam for known AAA. rt groin pain Vascular Interventions: 02/03/2020: Aortogram and Selective Left Lower Extremity                         Angiogram. PTA of the Left Anterior Tibial Artery with                         3mm balloon. PTA of the Left SFA and most Proximal                         Popliteal Artery. Viabahn stent placement x2 to the Left                         SFA. Limitations: Air/bowel gas and patient discomfort.  Comparison Study: 11/2020 Performing Technologist: Salvadore Farber RVT  Examination Guidelines: A complete evaluation includes B-mode imaging, spectral Doppler, color Doppler, and power Doppler as needed of all accessible portions of each vessel. Bilateral testing is considered an integral part of a complete examination. Limited examinations for reoccurring indications may be performed as noted.  Abdominal Aorta Findings: +-------------+-------+----------+----------+----------+--------+--------+ Location     AP (cm)Trans (cm)PSV (cm/s)Waveform  ThrombusComments +-------------+-------+----------+----------+----------+--------+--------+ Proximal     2.07   2.01      58        monophasic                 +-------------+-------+----------+----------+----------+--------+--------+ Mid          2.57   2.10      109        monophasic                 +-------------+-------+----------+----------+----------+--------+--------+ Distal       3.80  3.63                          Present          +-------------+-------+----------+----------+----------+--------+--------+ RT CIA Prox  0.7    0.8       83        biphasic                   +-------------+-------+----------+----------+----------+--------+--------+ RT EIA Prox                   105       triphasic                  +-------------+-------+----------+----------+----------+--------+--------+ RT EIA Mid                    101       triphasic                  +-------------+-------+----------+----------+----------+--------+--------+ RT EIA Distal                 106       triphasic                  +-------------+-------+----------+----------+----------+--------+--------+ LT CIA Prox  0.8    0.7       79        biphasic                   +-------------+-------+----------+----------+----------+--------+--------+ LT CIA Mid                    128       monophasic                 +-------------+-------+----------+----------+----------+--------+--------+ LT CIA Distal                 78        biphasic                   +-------------+-------+----------+----------+----------+--------+--------+   Summary: Abdominal Aorta: There is evidence of abnormal dilatation of the distal Abdominal aorta. Unable to visualize flow in the distal aorta but flow is seen in the proximal CIA's bilaterally suggesting some patency. Severe ammount of thrombus seen in the AAA, as was identified on previous CTA. The largest aortic diameter remains essentially unchanged compared to prior exam. Previous diameter measurement was 3.7 cm obtained on 11/2020. Stenosis: +------------+-------------+--------------------+ Location    Stenosis     Comments             +------------+-------------+--------------------+ Distal Aorta>50% stenosissignificant  thrombus +------------+-------------+--------------------+   *See table(s) above for measurements and observations.  Electronically signed by Festus Barren MD on 11/09/2021 at 8:47:15 AM.    Final    AAA Duplex  Result Date: 11/09/2021  LOWER EXTREMITY DOPPLER STUDY Patient Name:  DEAN GOLDNER  Date of Exam:   11/07/2021 Medical Rec #: [id number]       Accession #:    [id number] Date of Birth: 13-Feb-1961      Patient Gender: M Patient Age:   58 years Exam Location:  [facility] Procedure:      VAS [place] ABI WITH/WO TBI Referring Phys: Barbara Cower Emmah Bratcher --------------------------------------------------------------------------------  Indications: Peripheral artery disease, and S/P Angioplasty with Stent.  Vascular Interventions: 02/03/2020: Aortogram and Selective Left Lower Extremity  Angiogram. PTA of the Left Anterior Tibial Artery with                         3mm balloon. PTA of the Left SFA and most Proximal                         Popliteal Artery. Viabahn stent placement x2 to the Left                         SFA.                         12/29/2020 Angio Lt. PTA, tibioperoneal trunk, Lt. ATA and                         popliteal artery. Bilateral CIA, SFA and profunda                         endarterectomy. Thrombectomy Lt SFA and popliteal. Performing Technologist: Salvadore Farbererry Knight RVT  Examination Guidelines: A complete evaluation includes at minimum, Doppler waveform signals and systolic blood pressure reading at the level of bilateral brachial, anterior tibial, and posterior tibial arteries, when vessel segments are accessible. Bilateral testing is considered an integral part of a complete examination. Photoelectric Plethysmograph (PPG) waveforms and toe systolic pressure readings are included as required and additional duplex testing as needed. Limited examinations for reoccurring indications may be performed as noted.  ABI Findings:  +---------+------------------+-----+---------+--------+ Right    Rt Pressure (mmHg)IndexWaveform Comment  +---------+------------------+-----+---------+--------+ Brachial 143                                      +---------+------------------+-----+---------+--------+ ATA      159               1.11 biphasic          +---------+------------------+-----+---------+--------+ PTA      167               1.17 triphasic         +---------+------------------+-----+---------+--------+ Great Toe151               1.06 Normal            +---------+------------------+-----+---------+--------+ +---------+------------------+-----+---------+-------+ Left     Lt Pressure (mmHg)IndexWaveform Comment +---------+------------------+-----+---------+-------+ ATA      135               0.94 triphasic        +---------+------------------+-----+---------+-------+ PTA      158               1.10 triphasic        +---------+------------------+-----+---------+-------+ Great Toe154               1.08 Normal           +---------+------------------+-----+---------+-------+ +-------+-----------+-----------+------------+------------+ ABI/TBIToday's ABIToday's TBIPrevious ABIPrevious TBI +-------+-----------+-----------+------------+------------+ Right  1.17       1.06       1.09        .88          +-------+-----------+-----------+------------+------------+ Left   1.10       1.08       1.00        1.10         +-------+-----------+-----------+------------+------------+  Bilateral ABIs and TBIs appear essentially unchanged compared to prior study on 05/2021.  Summary: Right: Resting right ankle-brachial index is within normal range. No evidence of significant right lower extremity arterial disease. The right toe-brachial index is normal. Left: Resting left ankle-brachial index is within normal range. No evidence of significant left lower extremity arterial disease. The left  toe-brachial index is normal. *See table(s) above for measurements and observations.  Electronically signed by Festus Barren MD on 11/09/2021 at 8:47:06 AM.    Final     Assessment/Plan  AAA (abdominal aortic aneurysm) without rupture (HCC) I then dependently reviewed his CT angiogram.  His maximal aortic diameter measured approximately 4.2 cm.  At this point, he does not meet threshold size for prophylactic repair but we will want to follow this on 53-month intervals going forward.  I have discussed that this is unlikely to be a cause of any symptoms or problems at this point, but we will continue to monitor this.  I have discussed the primary risk factors for growth would be his continued tobacco use as well as his poorly controlled hypertension.  Return in 6 months with duplex.  Atherosclerosis of artery of extremity with rest pain (HCC) ABIs are stable at last check with good perfusion.  Had extensive left lower extremity revascularization about a year ago.  Recheck in 6 months.  Hypertension blood pressure control important in reducing the progression of atherosclerotic disease and aneurysmal growth. On appropriate oral medications.    Festus Barren, MD  11/23/2021 11:21 AM    This note was created with Dragon medical transcription system.  Any errors from dictation are purely unintentional

## 2021-11-23 NOTE — Assessment & Plan Note (Signed)
blood pressure control important in reducing the progression of atherosclerotic disease and aneurysmal growth. On appropriate oral medications.  

## 2021-11-27 ENCOUNTER — Ambulatory Visit (INDEPENDENT_AMBULATORY_CARE_PROVIDER_SITE_OTHER): Payer: 59 | Admitting: Vascular Surgery

## 2021-11-27 ENCOUNTER — Other Ambulatory Visit (INDEPENDENT_AMBULATORY_CARE_PROVIDER_SITE_OTHER): Payer: 59

## 2021-12-04 ENCOUNTER — Encounter (INDEPENDENT_AMBULATORY_CARE_PROVIDER_SITE_OTHER): Payer: 59

## 2021-12-04 ENCOUNTER — Ambulatory Visit (INDEPENDENT_AMBULATORY_CARE_PROVIDER_SITE_OTHER): Payer: 59 | Admitting: Vascular Surgery

## 2022-02-07 ENCOUNTER — Encounter (INDEPENDENT_AMBULATORY_CARE_PROVIDER_SITE_OTHER): Payer: 59

## 2022-02-07 ENCOUNTER — Ambulatory Visit (INDEPENDENT_AMBULATORY_CARE_PROVIDER_SITE_OTHER): Payer: 59 | Admitting: Nurse Practitioner

## 2022-03-04 DIAGNOSIS — R0602 Shortness of breath: Secondary | ICD-10-CM | POA: Diagnosis not present

## 2022-03-04 DIAGNOSIS — J449 Chronic obstructive pulmonary disease, unspecified: Secondary | ICD-10-CM | POA: Diagnosis not present

## 2022-03-04 DIAGNOSIS — R69 Illness, unspecified: Secondary | ICD-10-CM | POA: Diagnosis not present

## 2022-03-07 DIAGNOSIS — R2689 Other abnormalities of gait and mobility: Secondary | ICD-10-CM | POA: Diagnosis not present

## 2022-03-07 DIAGNOSIS — R202 Paresthesia of skin: Secondary | ICD-10-CM | POA: Diagnosis not present

## 2022-03-07 DIAGNOSIS — M79671 Pain in right foot: Secondary | ICD-10-CM | POA: Diagnosis not present

## 2022-03-07 DIAGNOSIS — M79672 Pain in left foot: Secondary | ICD-10-CM | POA: Diagnosis not present

## 2022-03-07 DIAGNOSIS — R42 Dizziness and giddiness: Secondary | ICD-10-CM | POA: Diagnosis not present

## 2022-03-07 DIAGNOSIS — R200 Anesthesia of skin: Secondary | ICD-10-CM | POA: Diagnosis not present

## 2022-03-07 DIAGNOSIS — R208 Other disturbances of skin sensation: Secondary | ICD-10-CM | POA: Diagnosis not present

## 2022-03-07 DIAGNOSIS — R531 Weakness: Secondary | ICD-10-CM | POA: Diagnosis not present

## 2022-03-19 DIAGNOSIS — M722 Plantar fascial fibromatosis: Secondary | ICD-10-CM | POA: Diagnosis not present

## 2022-03-19 DIAGNOSIS — M79671 Pain in right foot: Secondary | ICD-10-CM | POA: Diagnosis not present

## 2022-03-19 DIAGNOSIS — Z7901 Long term (current) use of anticoagulants: Secondary | ICD-10-CM | POA: Diagnosis not present

## 2022-03-19 DIAGNOSIS — B070 Plantar wart: Secondary | ICD-10-CM | POA: Diagnosis not present

## 2022-03-19 DIAGNOSIS — M79672 Pain in left foot: Secondary | ICD-10-CM | POA: Diagnosis not present

## 2022-03-21 DIAGNOSIS — J449 Chronic obstructive pulmonary disease, unspecified: Secondary | ICD-10-CM | POA: Diagnosis not present

## 2022-03-21 DIAGNOSIS — Z720 Tobacco use: Secondary | ICD-10-CM | POA: Diagnosis not present

## 2022-03-21 DIAGNOSIS — I471 Supraventricular tachycardia, unspecified: Secondary | ICD-10-CM | POA: Diagnosis not present

## 2022-03-21 DIAGNOSIS — Z9889 Other specified postprocedural states: Secondary | ICD-10-CM | POA: Diagnosis not present

## 2022-03-21 DIAGNOSIS — Z8679 Personal history of other diseases of the circulatory system: Secondary | ICD-10-CM | POA: Diagnosis not present

## 2022-03-21 DIAGNOSIS — I779 Disorder of arteries and arterioles, unspecified: Secondary | ICD-10-CM | POA: Diagnosis not present

## 2022-03-21 DIAGNOSIS — R69 Illness, unspecified: Secondary | ICD-10-CM | POA: Diagnosis not present

## 2022-03-21 DIAGNOSIS — Z95828 Presence of other vascular implants and grafts: Secondary | ICD-10-CM | POA: Diagnosis not present

## 2022-03-21 DIAGNOSIS — I10 Essential (primary) hypertension: Secondary | ICD-10-CM | POA: Diagnosis not present

## 2022-03-21 DIAGNOSIS — I70213 Atherosclerosis of native arteries of extremities with intermittent claudication, bilateral legs: Secondary | ICD-10-CM | POA: Diagnosis not present

## 2022-03-21 DIAGNOSIS — I25118 Atherosclerotic heart disease of native coronary artery with other forms of angina pectoris: Secondary | ICD-10-CM | POA: Diagnosis not present

## 2022-04-11 DIAGNOSIS — R200 Anesthesia of skin: Secondary | ICD-10-CM | POA: Diagnosis not present

## 2022-04-11 DIAGNOSIS — R531 Weakness: Secondary | ICD-10-CM | POA: Diagnosis not present

## 2022-04-11 DIAGNOSIS — M722 Plantar fascial fibromatosis: Secondary | ICD-10-CM | POA: Diagnosis not present

## 2022-04-11 DIAGNOSIS — R202 Paresthesia of skin: Secondary | ICD-10-CM | POA: Diagnosis not present

## 2022-04-11 DIAGNOSIS — M79672 Pain in left foot: Secondary | ICD-10-CM | POA: Diagnosis not present

## 2022-04-11 DIAGNOSIS — R42 Dizziness and giddiness: Secondary | ICD-10-CM | POA: Diagnosis not present

## 2022-04-11 DIAGNOSIS — R208 Other disturbances of skin sensation: Secondary | ICD-10-CM | POA: Diagnosis not present

## 2022-04-11 DIAGNOSIS — M79671 Pain in right foot: Secondary | ICD-10-CM | POA: Diagnosis not present

## 2022-04-11 DIAGNOSIS — R2689 Other abnormalities of gait and mobility: Secondary | ICD-10-CM | POA: Diagnosis not present

## 2022-04-11 DIAGNOSIS — B070 Plantar wart: Secondary | ICD-10-CM | POA: Diagnosis not present

## 2022-04-23 ENCOUNTER — Ambulatory Visit: Payer: Self-pay

## 2022-04-23 NOTE — Telephone Encounter (Signed)
  Chief Complaint: left facial drooping and weakness Symptoms: BP elevation to 156/98 Frequency: last night Pertinent Negatives: Patient denies other sx Disposition: [x] ED /[] Urgent Care (no appt availability in office) / [] Appointment(In office/virtual)/ []  [facility]/ [] Home Care/ [] Refused Recommended Disposition /[] [place] [facility]/ []  Follow-up with PCP Additional Notes:  Reason for Disposition  [1] Weakness (i.e., paralysis, loss of muscle strength) of the face, arm / hand, or leg / foot on one side of the body AND [2] sudden onset AND [3] present now  (Exception: Bell's palsy suspected [i.e., weakness only on one side of the face, developing over hours to days, no other symptoms].)    Left facial drooping  Answer Assessment - Initial Assessment Questions 1. SYMPTOM: "What is the main symptom you are concerned about?" (e.g., weakness, numbness)     Laft facial dropping 2. ONSET: "When did this start?" (minutes, hours, days; while sleeping)     Last night 3. LAST NORMAL: "When was the last time you (the patient) were normal (no symptoms)?"     Earlier yesterday 4. PATTERN "Does this come and go, or has it been constant since it started?"  "Is it present now?"     Constnat- yes 5. CARDIAC SYMPTOMS: "Have you had any of the following symptoms: chest pain, difficulty breathing, palpitations?"     Did not ask  6. NEUROLOGIC SYMPTOMS: "Have you had any of the following symptoms: headache, dizziness, vision loss, double vision, changes in speech, unsteady on your feet?"     Weakness of left face 7. OTHER SYMPTOMS: "Do you have any other symptoms?"     BP 158/98 8. PREGNANCY: "Is there any chance you are pregnant?" "When was your last menstrual period?"     N/a  Protocols used: Neurologic Deficit-A-AH

## 2022-05-03 DIAGNOSIS — I10 Essential (primary) hypertension: Secondary | ICD-10-CM | POA: Diagnosis not present

## 2022-05-03 DIAGNOSIS — E559 Vitamin D deficiency, unspecified: Secondary | ICD-10-CM | POA: Diagnosis not present

## 2022-05-03 DIAGNOSIS — R0989 Other specified symptoms and signs involving the circulatory and respiratory systems: Secondary | ICD-10-CM | POA: Diagnosis not present

## 2022-05-03 DIAGNOSIS — Z9889 Other specified postprocedural states: Secondary | ICD-10-CM | POA: Diagnosis not present

## 2022-05-03 DIAGNOSIS — R079 Chest pain, unspecified: Secondary | ICD-10-CM | POA: Diagnosis not present

## 2022-05-03 DIAGNOSIS — I70229 Atherosclerosis of native arteries of extremities with rest pain, unspecified extremity: Secondary | ICD-10-CM | POA: Diagnosis not present

## 2022-05-03 DIAGNOSIS — Z131 Encounter for screening for diabetes mellitus: Secondary | ICD-10-CM | POA: Diagnosis not present

## 2022-05-03 DIAGNOSIS — I70213 Atherosclerosis of native arteries of extremities with intermittent claudication, bilateral legs: Secondary | ICD-10-CM | POA: Diagnosis not present

## 2022-05-03 DIAGNOSIS — J449 Chronic obstructive pulmonary disease, unspecified: Secondary | ICD-10-CM | POA: Diagnosis not present

## 2022-05-03 DIAGNOSIS — Z0000 Encounter for general adult medical examination without abnormal findings: Secondary | ICD-10-CM | POA: Diagnosis not present

## 2022-05-03 DIAGNOSIS — Z125 Encounter for screening for malignant neoplasm of prostate: Secondary | ICD-10-CM | POA: Diagnosis not present

## 2022-05-03 DIAGNOSIS — I25118 Atherosclerotic heart disease of native coronary artery with other forms of angina pectoris: Secondary | ICD-10-CM | POA: Diagnosis not present

## 2022-05-03 DIAGNOSIS — I714 Abdominal aortic aneurysm, without rupture, unspecified: Secondary | ICD-10-CM | POA: Diagnosis not present

## 2022-05-03 DIAGNOSIS — I471 Supraventricular tachycardia, unspecified: Secondary | ICD-10-CM | POA: Diagnosis not present

## 2022-05-03 DIAGNOSIS — E7800 Pure hypercholesterolemia, unspecified: Secondary | ICD-10-CM | POA: Diagnosis not present

## 2022-05-03 DIAGNOSIS — I70223 Atherosclerosis of native arteries of extremities with rest pain, bilateral legs: Secondary | ICD-10-CM | POA: Diagnosis not present

## 2022-05-03 DIAGNOSIS — R609 Edema, unspecified: Secondary | ICD-10-CM | POA: Diagnosis not present

## 2022-05-15 ENCOUNTER — Other Ambulatory Visit: Payer: Self-pay | Admitting: Gerontology

## 2022-05-15 DIAGNOSIS — R0989 Other specified symptoms and signs involving the circulatory and respiratory systems: Secondary | ICD-10-CM

## 2022-05-16 ENCOUNTER — Other Ambulatory Visit: Payer: Self-pay | Admitting: Gerontology

## 2022-05-16 DIAGNOSIS — Z0000 Encounter for general adult medical examination without abnormal findings: Secondary | ICD-10-CM

## 2022-05-16 DIAGNOSIS — R0989 Other specified symptoms and signs involving the circulatory and respiratory systems: Secondary | ICD-10-CM

## 2022-05-23 DIAGNOSIS — M79671 Pain in right foot: Secondary | ICD-10-CM | POA: Diagnosis not present

## 2022-05-23 DIAGNOSIS — M79672 Pain in left foot: Secondary | ICD-10-CM | POA: Diagnosis not present

## 2022-05-23 DIAGNOSIS — B070 Plantar wart: Secondary | ICD-10-CM | POA: Diagnosis not present

## 2022-05-23 DIAGNOSIS — Z7901 Long term (current) use of anticoagulants: Secondary | ICD-10-CM | POA: Diagnosis not present

## 2022-05-23 DIAGNOSIS — M722 Plantar fascial fibromatosis: Secondary | ICD-10-CM | POA: Diagnosis not present

## 2022-05-28 ENCOUNTER — Encounter (INDEPENDENT_AMBULATORY_CARE_PROVIDER_SITE_OTHER): Payer: Self-pay | Admitting: Vascular Surgery

## 2022-05-28 ENCOUNTER — Ambulatory Visit (INDEPENDENT_AMBULATORY_CARE_PROVIDER_SITE_OTHER): Payer: 59

## 2022-05-28 ENCOUNTER — Ambulatory Visit (INDEPENDENT_AMBULATORY_CARE_PROVIDER_SITE_OTHER): Payer: 59 | Admitting: Vascular Surgery

## 2022-05-28 VITALS — BP 157/95 | HR 65 | Ht 70.0 in | Wt 174.0 lb

## 2022-05-28 DIAGNOSIS — I7143 Infrarenal abdominal aortic aneurysm, without rupture: Secondary | ICD-10-CM

## 2022-05-28 DIAGNOSIS — I70229 Atherosclerosis of native arteries of extremities with rest pain, unspecified extremity: Secondary | ICD-10-CM

## 2022-05-28 DIAGNOSIS — I10 Essential (primary) hypertension: Secondary | ICD-10-CM

## 2022-05-28 NOTE — Assessment & Plan Note (Signed)
Duplex today shows a stable 4 cm infrarenal abdominal aortic.  No role for intervention at this size.  Continue to follow with duplex in 6 months.

## 2022-05-28 NOTE — Assessment & Plan Note (Signed)
ABIs today are 0.91 on the right and 1.01 on the left with good digital waveforms and pressures. He still has neuropathic symptoms but his perfusion is intact after revascularization.  Recheck in 6 months. Continue current medications.

## 2022-05-28 NOTE — Progress Notes (Signed)
MRN : [id number]  Scott [name] is a 62 y.o. (1960/05/03) male who presents with chief complaint of  Chief Complaint  Patient presents with   Follow-up    6 month follow up AAA + ABI  .  History of Present Illness: Patient returns today in follow up of multiple vascular issues.  He has been diagnosed with COPD and had a small stroke since his last visit.  He has no aneurysm related symptoms. Specifically, the patient denies new back or abdominal pain, or signs of peripheral embolization.  Duplex today shows a stable 4 cm infrarenal abdominal aortic. He is also s/p revascularization for rest pain with persistent neuropathic symptoms.  No open wounds or infection. ABIs today are 0.91 on the right and 1.01 on the left with good digital waveforms and pressures.  Current Outpatient Medications  Medication Sig Dispense Refill   amLODipine (NORVASC) 10 MG tablet Take 10 mg by mouth daily.     atorvastatin (LIPITOR) 40 MG tablet Take 40 mg by mouth daily.     cholecalciferol (VITAMIN D3) 25 MCG (1000 UNIT) tablet Take 1,000 Units by mouth daily.     gabapentin (NEURONTIN) 100 MG capsule Take 100 mg by mouth 3 (three) times daily.     lisinopril-hydrochlorothiazide (ZESTORETIC) 20-12.5 MG tablet Take 1 tablet by mouth daily.     BREO ELLIPTA 200-25 MCG/INH AEPB Inhale 1 puff into the lungs daily.     Carboxymethylcellul-Glycerin (CLEAR EYES FOR DRY EYES) 1-0.25 % SOLN Place 1 drop into both eyes daily.     isosorbide mononitrate (IMDUR) 30 MG 24 hr tablet Take 30 mg by mouth daily.  5   metoprolol succinate (TOPROL-XL) 25 MG 24 hr tablet Take 25 mg by mouth daily.      nicotine (NICODERM CQ - DOSED IN MG/24 HOURS) 21 mg/24hr patch Place 21 mg onto the skin daily.     sodium chloride (OCEAN) 0.65 % SOLN nasal spray Place 1 spray into both nostrils daily as needed for congestion.     vitamin B-12 (CYANOCOBALAMIN) 500 MCG tablet Take 500 mcg by mouth daily.     No current facility-administered  medications for this visit.    Past Medical History:  Diagnosis Date   Anxiety    Arthritis    COPD (chronic obstructive pulmonary disease) ([place])    Coronary artery disease    COVID-19 2022   Depression    Dyspnea    Dysrhythmia    Hypertension    Pre-diabetes     Past Surgical History:  Procedure Laterality Date   CHOLECYSTECTOMY     CHOLECYSTECTOMY  2000   ENDARTERECTOMY FEMORAL Bilateral 12/29/2020   Procedure: ENDARTERECTOMY FEMORAL;  Surgeon: Algernon Huxley, MD;  Location: [facility];  Service: Vascular;  Laterality: Bilateral;  Left SFA and Tibial intervention   LEFT HEART CATH AND CORONARY ANGIOGRAPHY Left 06/03/2017   Procedure: LEFT HEART CATH AND CORONARY ANGIOGRAPHY;  Surgeon: Yolonda Kida, MD;  Location: [place] [facility];  Service: Cardiovascular;  Laterality: Left;   LEFT HEART CATH AND CORONARY ANGIOGRAPHY N/A 08/22/2021   Procedure: LEFT HEART CATH AND CORONARY ANGIOGRAPHY;  Surgeon: Yolonda Kida, MD;  Location: [place] [facility];  Service: Cardiovascular;  Laterality: N/A;   LOWER EXTREMITY ANGIOGRAPHY Left 02/03/2020   Procedure: LOWER EXTREMITY ANGIOGRAPHY;  Surgeon: Algernon Huxley, MD;  Location: [place] [facility];  Service: Cardiovascular;  Laterality: Left;   TONSILLECTOMY       Social  History   Tobacco Use   Smoking status: Every Day    Packs/day: 1.00    Types: Cigarettes   Smokeless tobacco: Never  Substance Use Topics   Alcohol use: Yes    Comment: 3-4 beers per day   Drug use: No     Family History  Problem Relation Age of Onset   Diabetes Mother    Hypertension Mother    Lupus Mother    Heart attack Father    Cancer Brother    Cancer Brother      Allergies  Allergen Reactions   Acetaminophen-Codeine Other (See Comments)    Nausea and "hot flash"    Chantix [Varenicline] Nausea Only    Sour taste in mouth   Codeine Nausea Only    REVIEW OF SYSTEMS (Negative unless checked)   Constitutional: [] Weight loss   [] Fever  [] Chills Cardiac: [] Chest pain   [] Chest pressure   [] Palpitations   [] Shortness of breath when laying flat   [] Shortness of breath at rest   [] Shortness of breath with exertion. Vascular:  [] Pain in legs with walking   [x] Pain in legs at rest   [] Pain in legs when laying flat   [] Claudication   [] Pain in feet when walking  [] Pain in feet at rest  [] Pain in feet when laying flat   [] History of DVT   [] Phlebitis   [x] Swelling in legs   [] Varicose veins   [] Non-healing ulcers Pulmonary:   [] Uses home oxygen   [] Productive cough   [] Hemoptysis   [] Wheeze  [x] COPD   [] Asthma Neurologic:  [] Dizziness  [] Blackouts   [] Seizures   [] History of stroke   [] History of TIA  [] Aphasia   [] Temporary blindness   [] Dysphagia   [] Weakness or numbness in arms   [x] Weakness or numbness in legs Musculoskeletal:  [x] Arthritis   [] Joint swelling   [x] Joint pain   [] Low back pain Hematologic:  [] Easy bruising  [] Easy bleeding   [] Hypercoagulable state   [] Anemic   Gastrointestinal:  [] Blood in stool   [] Vomiting blood  [] Gastroesophageal reflux/heartburn   [] Abdominal pain Genitourinary:  [] Chronic kidney disease   [] Difficult urination  [] Frequent urination  [] Burning with urination   [] Hematuria Skin:  [] Rashes   [] Ulcers   [] Wounds Psychological:  [x] History of anxiety   []  History of major depression.  Physical Examination  BP (!) 157/95   Pulse 65   Ht 5\' 10"  (1.778 m)   Wt 174 lb (78.9 kg)   BMI 24.97 kg/m  Gen:  WD/WN, NAD. Appears older than stated age. Head: [place]/AT, No temporalis wasting. Ear/Nose/Throat: Hearing grossly intact, nares w/o erythema or drainage Eyes: Conjunctiva clear. Sclera non-icteric Neck: Supple.  Trachea midline Pulmonary:  Good air movement, no use of accessory muscles.  Cardiac: RRR, no JVD Vascular:  Vessel Right Left  Radial Palpable Palpable                          PT Palpable Palpable  DP Palpable Palpable   Gastrointestinal: soft,  non-tender/non-distended. No guarding/reflex.  Musculoskeletal: M/S 5/5 throughout.  No deformity or atrophy. Trace LE edema. Neurologic: Sensation grossly intact in extremities.  Symmetrical.  Speech is fluent.  Psychiatric: Judgment intact, Mood & affect appropriate for pt's clinical situation. Dermatologic: No rashes or ulcers noted.  No cellulitis or open wounds.     Labs No results found for this or any previous visit (from the past 2160 hour(s)).  Radiology No results found.  Assessment/Plan Hypertension blood pressure control important in reducing the progression of atherosclerotic disease and aneurysmal growth. On appropriate oral medications.  AAA (abdominal aortic aneurysm) without rupture (HCC) Duplex today shows a stable 4 cm infrarenal abdominal aortic.  No role for intervention at this size.  Continue to follow with duplex in 6 months.  Atherosclerosis of artery of extremity with rest pain (HCC) ABIs today are 0.91 on the right and 1.01 on the left with good digital waveforms and pressures. He still has neuropathic symptoms but his perfusion is intact after revascularization.  Recheck in 6 months. Continue current medications.     Leotis Pain, MD  05/28/2022 10:25 AM    This note was created with Dragon medical transcription system.  Any errors from dictation are purely unintentional

## 2022-05-29 DIAGNOSIS — M79671 Pain in right foot: Secondary | ICD-10-CM | POA: Diagnosis not present

## 2022-05-29 DIAGNOSIS — R200 Anesthesia of skin: Secondary | ICD-10-CM | POA: Diagnosis not present

## 2022-05-29 DIAGNOSIS — M79672 Pain in left foot: Secondary | ICD-10-CM | POA: Diagnosis not present

## 2022-05-29 DIAGNOSIS — R208 Other disturbances of skin sensation: Secondary | ICD-10-CM | POA: Diagnosis not present

## 2022-05-29 DIAGNOSIS — R2689 Other abnormalities of gait and mobility: Secondary | ICD-10-CM | POA: Diagnosis not present

## 2022-05-29 DIAGNOSIS — R202 Paresthesia of skin: Secondary | ICD-10-CM | POA: Diagnosis not present

## 2022-05-29 DIAGNOSIS — R531 Weakness: Secondary | ICD-10-CM | POA: Diagnosis not present

## 2022-05-29 DIAGNOSIS — R42 Dizziness and giddiness: Secondary | ICD-10-CM | POA: Diagnosis not present

## 2022-05-31 LAB — VAS US ABI WITH/WO TBI
Left ABI: 1.01
Right ABI: 0.91

## 2022-06-06 DIAGNOSIS — M79671 Pain in right foot: Secondary | ICD-10-CM | POA: Diagnosis not present

## 2022-06-06 DIAGNOSIS — Z7901 Long term (current) use of anticoagulants: Secondary | ICD-10-CM | POA: Diagnosis not present

## 2022-06-06 DIAGNOSIS — M79672 Pain in left foot: Secondary | ICD-10-CM | POA: Diagnosis not present

## 2022-06-06 DIAGNOSIS — B070 Plantar wart: Secondary | ICD-10-CM | POA: Diagnosis not present

## 2022-06-11 ENCOUNTER — Ambulatory Visit
Admission: RE | Admit: 2022-06-11 | Discharge: 2022-06-11 | Disposition: A | Discharge status: Home / Self Care | Payer: 59 | Source: Ambulatory Visit | Attending: Gerontology | Admitting: Gerontology

## 2022-06-11 DIAGNOSIS — R0989 Other specified symptoms and signs involving the circulatory and respiratory systems: Secondary | ICD-10-CM

## 2022-06-11 DIAGNOSIS — Z0000 Encounter for general adult medical examination without abnormal findings: Secondary | ICD-10-CM

## 2022-06-11 MED ORDER — GADOPICLENOL 0.5 MMOL/ML IV SOLN
7.5000 mL | Freq: Once | INTRAVENOUS | Status: AC | PRN
  Administered 2022-06-11: 7.5 mL via INTRAVENOUS

## 2022-06-14 DIAGNOSIS — R079 Chest pain, unspecified: Secondary | ICD-10-CM | POA: Diagnosis not present

## 2022-06-14 DIAGNOSIS — G8929 Other chronic pain: Secondary | ICD-10-CM | POA: Insufficient documentation

## 2022-06-14 DIAGNOSIS — M79672 Pain in left foot: Secondary | ICD-10-CM | POA: Diagnosis not present

## 2022-06-14 DIAGNOSIS — R0989 Other specified symptoms and signs involving the circulatory and respiratory systems: Secondary | ICD-10-CM | POA: Diagnosis not present

## 2022-06-14 DIAGNOSIS — I10 Essential (primary) hypertension: Secondary | ICD-10-CM | POA: Diagnosis not present

## 2022-06-14 DIAGNOSIS — J449 Chronic obstructive pulmonary disease, unspecified: Secondary | ICD-10-CM | POA: Diagnosis not present

## 2022-06-18 IMAGING — CT CT CHEST LUNG CANCER SCREENING LOW DOSE W/O CM
1 series · 10 of 10 positions shown, 13 images · non-contrast
Comparison: Chest CT 12/18/2020.

CLINICAL DATA: 60-year-old male current smoker with 22 pack-year
history of smoking. Lung cancer screening examination.



[ct lung segmentation data · axial · 0.74mm/px · z∈[-371,-371]mm · 10 of 316 frames shown]
[frame 1/316  mediastinal]
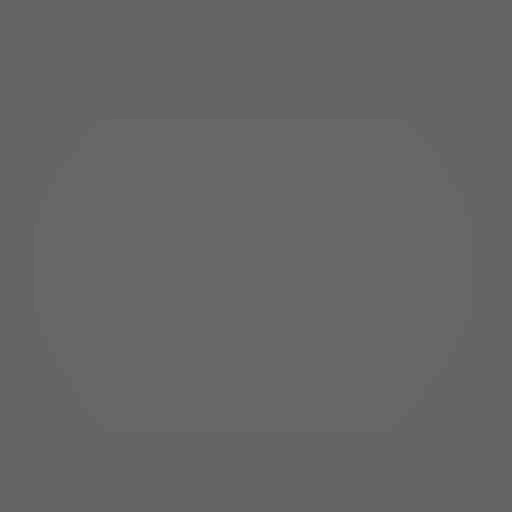
[frame 1/316  lung]
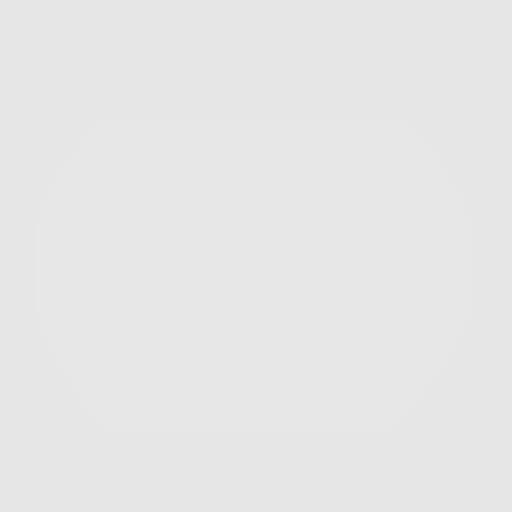
[frame 36/316  lung]
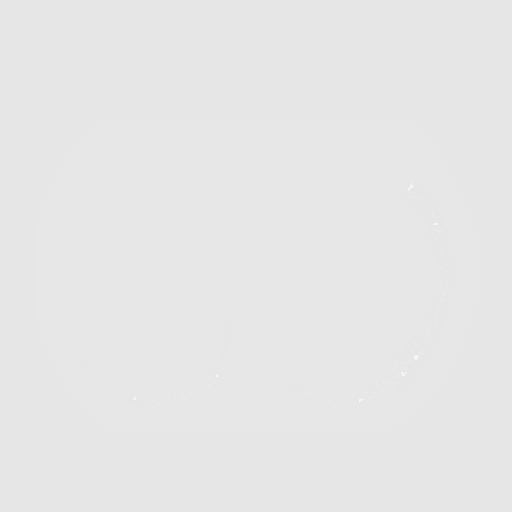
[frame 71/316  lung]
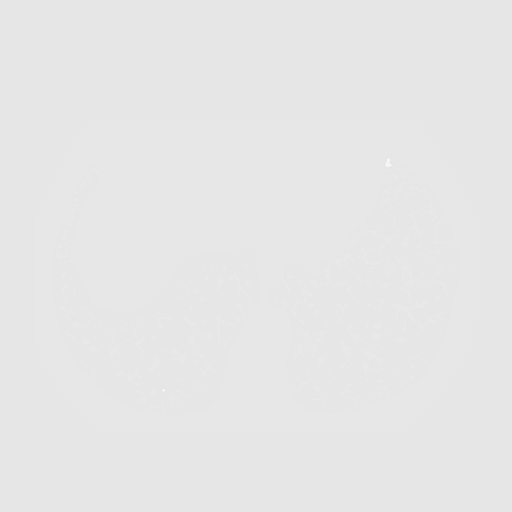
[frame 106/316  lung]
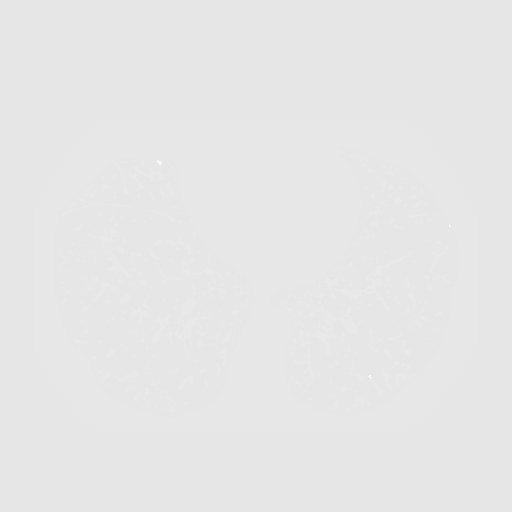
[frame 141/316  mediastinal]
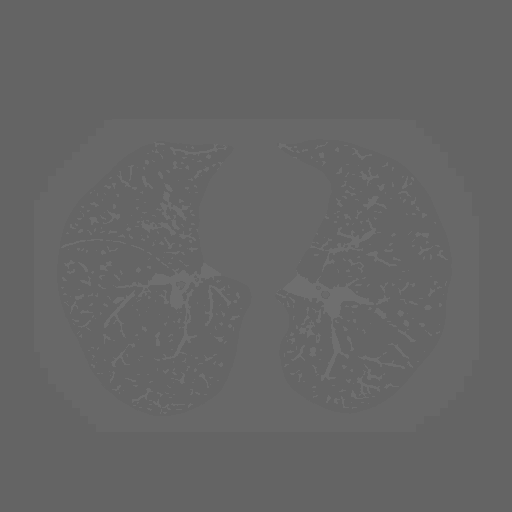
[frame 141/316  lung]
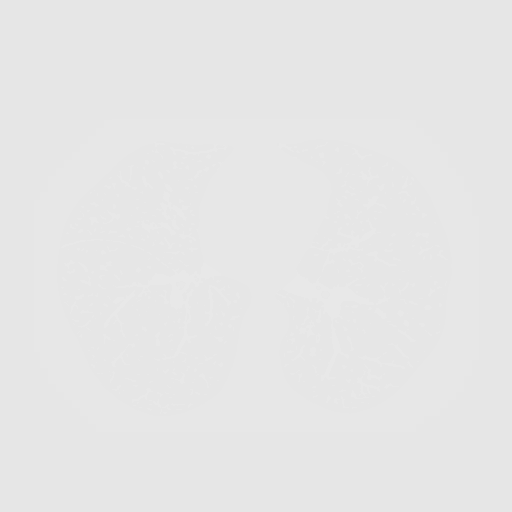
[frame 176/316  lung]
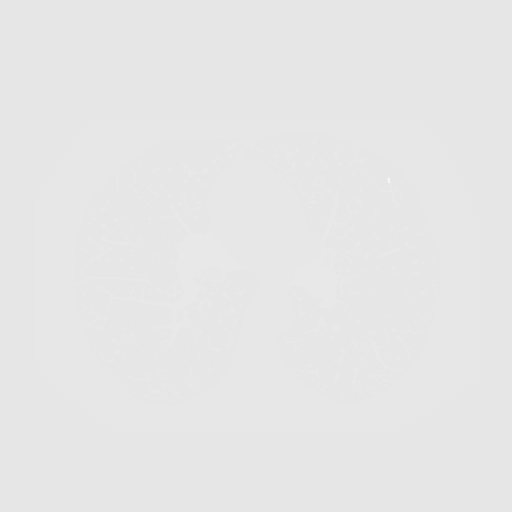
[frame 211/316  lung]
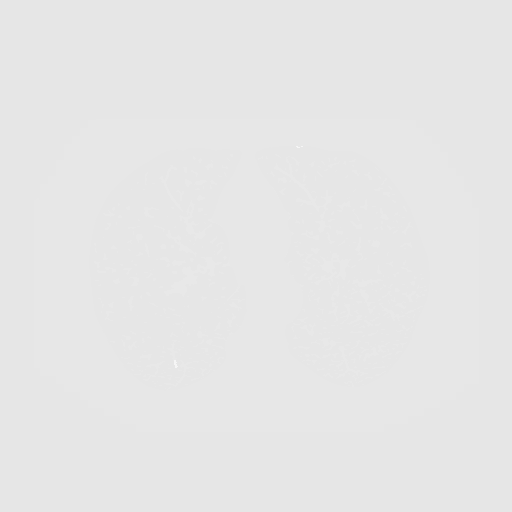
[frame 246/316  lung]
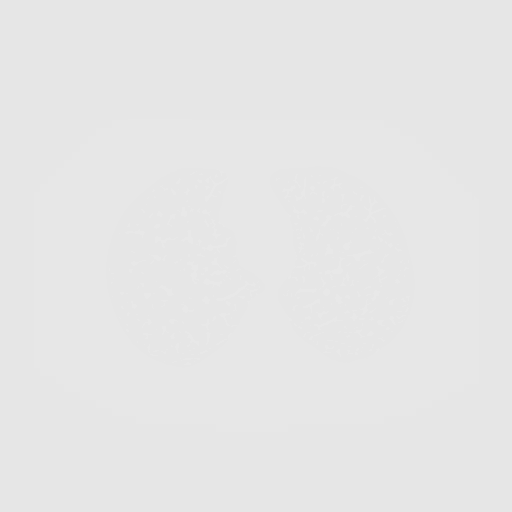
[frame 281/316  mediastinal]
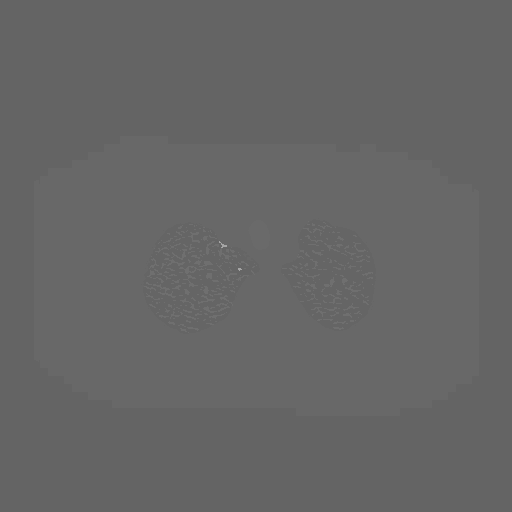
[frame 281/316  lung]
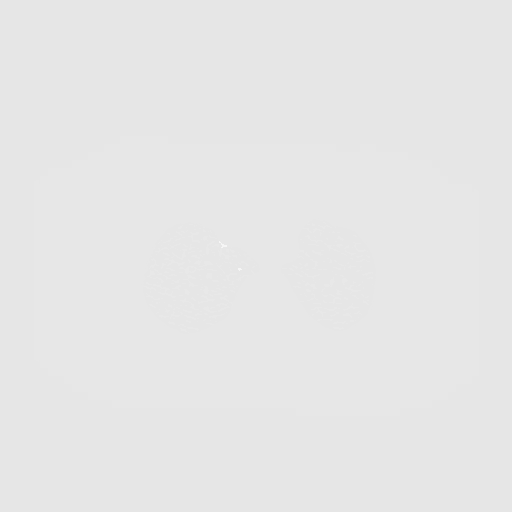
[frame 316/316  lung]
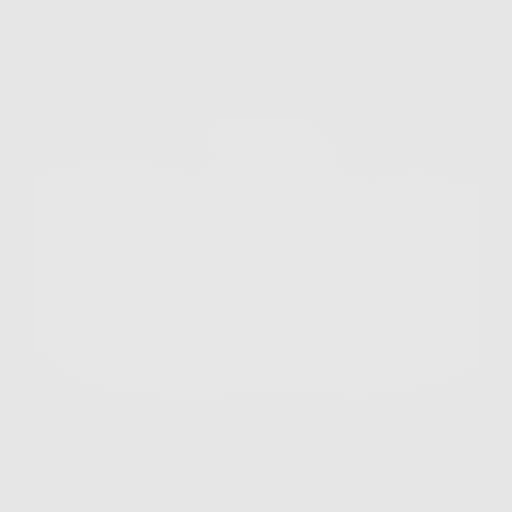

[10 of 10 positions shown; findings below may reference images not displayed]

FINDINGS: Cardiovascular: Heart size is normal. There is no significant
pericardial fluid, thickening or pericardial calcification. There is
aortic atherosclerosis, as well as atherosclerosis of the great
vessels of the mediastinum and the coronary arteries, including
calcified atherosclerotic plaque in the left main, left anterior
descending, left circumflex and right coronary arteries.
Calcifications of the aortic valve.

Mediastinum/Nodes: No pathologically enlarged mediastinal or hilar
lymph nodes. Please note that accurate exclusion of hilar adenopathy
is limited on noncontrast CT scans. Esophagus is unremarkable in
appearance. No axillary lymphadenopathy.

Lungs/Pleura: Small pulmonary nodule in the periphery of the left
upper lobe (axial image 127 of series 3), with a volume derived mean
diameter of 2.4 mm. No larger more suspicious appearing pulmonary
nodules or masses are noted. No acute consolidative airspace
disease. No pleural effusions. Diffuse bronchial wall thickening
with very mild centrilobular and paraseptal emphysema.

Upper Abdomen: Aortic atherosclerosis.

Musculoskeletal: Old fractures of the lateral aspect of the right
eighth and ninth ribs are noted. There are no aggressive appearing
lytic or blastic lesions noted in the visualized portions of the
skeleton.
IMPRESSION: 1. Lung-RADS 2S, benign appearance or behavior. Continue annual
screening with low-dose chest CT without contrast in 12 months.
2. The "S" modifier above refers to potentially clinically
significant non lung cancer related findings. Specifically, there is
aortic atherosclerosis, in addition to left main and three-vessel
coronary artery disease. Please note that although the presence of
coronary artery calcium documents the presence of coronary artery
disease, the severity of this disease and any potential stenosis
cannot be assessed on this non-gated CT examination. Assessment for
potential risk factor modification, dietary therapy or pharmacologic
therapy may be warranted, if clinically indicated.
3. Mild diffuse bronchial wall thickening with mild centrilobular
and paraseptal emphysema; imaging findings suggestive of underlying
COPD.
4. There are calcifications of the aortic valve. Echocardiographic
correlation for evaluation of potential valvular dysfunction may be
warranted if clinically indicated.

Aortic Atherosclerosis (YZ3X4-ZBI.I) and Emphysema (YZ3X4-HAU.5).

## 2022-06-19 DIAGNOSIS — R202 Paresthesia of skin: Secondary | ICD-10-CM | POA: Diagnosis not present

## 2022-06-19 DIAGNOSIS — G90523 Complex regional pain syndrome I of lower limb, bilateral: Secondary | ICD-10-CM | POA: Diagnosis not present

## 2022-06-19 DIAGNOSIS — M79672 Pain in left foot: Secondary | ICD-10-CM | POA: Diagnosis not present

## 2022-06-19 DIAGNOSIS — R208 Other disturbances of skin sensation: Secondary | ICD-10-CM | POA: Diagnosis not present

## 2022-06-19 DIAGNOSIS — R2689 Other abnormalities of gait and mobility: Secondary | ICD-10-CM | POA: Diagnosis not present

## 2022-06-19 DIAGNOSIS — M79671 Pain in right foot: Secondary | ICD-10-CM | POA: Diagnosis not present

## 2022-06-19 DIAGNOSIS — R200 Anesthesia of skin: Secondary | ICD-10-CM | POA: Diagnosis not present

## 2022-06-19 DIAGNOSIS — R42 Dizziness and giddiness: Secondary | ICD-10-CM | POA: Diagnosis not present

## 2022-06-19 DIAGNOSIS — R531 Weakness: Secondary | ICD-10-CM | POA: Diagnosis not present

## 2022-07-03 DIAGNOSIS — J449 Chronic obstructive pulmonary disease, unspecified: Secondary | ICD-10-CM | POA: Diagnosis not present

## 2022-07-03 DIAGNOSIS — R0789 Other chest pain: Secondary | ICD-10-CM | POA: Diagnosis not present

## 2022-07-03 DIAGNOSIS — R0602 Shortness of breath: Secondary | ICD-10-CM | POA: Diagnosis not present

## 2022-07-03 DIAGNOSIS — F17210 Nicotine dependence, cigarettes, uncomplicated: Secondary | ICD-10-CM | POA: Diagnosis not present

## 2022-07-11 ENCOUNTER — Ambulatory Visit
Admission: RE | Admit: 2022-07-11 | Discharge: 2022-07-11 | Disposition: A | Discharge status: Home / Self Care | Payer: 59 | Source: Ambulatory Visit | Attending: Acute Care | Admitting: Acute Care

## 2022-07-11 DIAGNOSIS — F17210 Nicotine dependence, cigarettes, uncomplicated: Secondary | ICD-10-CM

## 2022-07-11 DIAGNOSIS — Z87891 Personal history of nicotine dependence: Secondary | ICD-10-CM

## 2022-07-12 ENCOUNTER — Telehealth: Payer: Self-pay | Admitting: Acute Care

## 2022-07-12 NOTE — Telephone Encounter (Signed)
Called and spoke with Tiffany. She was calling in regards to the lung cancer CT the patient recently completed. Below is a copy of the impression:   IMPRESSION: 1. Lung-RADS 4BS, suspicious. Additional imaging evaluation or consultation with Pulmonology or Thoracic Surgery recommended. 2. The "S" modifier above refers to potentially clinically significant non lung cancer related findings. Specifically, there is aortic atherosclerosis, in addition to left main and three-vessel coronary artery disease. Please note that although the presence of coronary artery calcium documents the presence of coronary artery disease, the severity of this disease and any potential stenosis cannot be assessed on this non-gated CT examination. Assessment for potential risk factor modification, dietary therapy or pharmacologic therapy may be warranted, if clinically indicated. 3. Mild diffuse bronchial wall thickening with mild centrilobular and paraseptal emphysema; imaging findings suggestive of underlying COPD. 4. There are calcifications of the aortic valve. Echocardiographic correlation for evaluation of potential valvular dysfunction may be warranted if clinically indicated.   These results will be called to the ordering clinician or representative by the Radiologist Assistant, and communication documented in the PACS or [employer].   Aortic Atherosclerosis (ICD10-I70.0) and Emphysema (ICD10-J43.9).     Electronically Signed   By: Vinnie Langton M.D.   On: 07/12/2022 08:37  Will route to Judson Roch and the lung cancer screening pool for follow up.

## 2022-07-12 NOTE — Telephone Encounter (Signed)
[facility] Call report/.

## 2022-07-15 NOTE — Telephone Encounter (Signed)
Attempted to contact pt regarding CT results. Voicemail not set up yet. Unable to leave a message. Will call back . Pt has appt with Dr Raul Del on 07/17/22 for recheck from recent sickness.

## 2022-07-15 NOTE — Telephone Encounter (Signed)
Called and spoke with pt and advised of new nodules seen on Chest CT. Pt was seen and treated by Dr Raul Del on 07/04/22 with respiratory symptoms but pt reports continuing to have prod cough with "lots of mucus". Pt is scheduled to see Dr Raul Del for follow up on 07/17/22. We will await the results from this visit to see when Dr Raul Del would like to repeat the CT.  Pt also advised of calcifications of the aortic valve. Echo was recommended. Results sent to pt's cardiologist , Dr Clayborn Bigness.  Pt verbalized understanding and had no further questions.

## 2022-07-18 DIAGNOSIS — I779 Disorder of arteries and arterioles, unspecified: Secondary | ICD-10-CM | POA: Diagnosis not present

## 2022-07-18 DIAGNOSIS — Z86718 Personal history of other venous thrombosis and embolism: Secondary | ICD-10-CM | POA: Diagnosis not present

## 2022-07-18 DIAGNOSIS — I471 Supraventricular tachycardia, unspecified: Secondary | ICD-10-CM | POA: Diagnosis not present

## 2022-07-18 DIAGNOSIS — Z95828 Presence of other vascular implants and grafts: Secondary | ICD-10-CM | POA: Diagnosis not present

## 2022-07-18 DIAGNOSIS — Z720 Tobacco use: Secondary | ICD-10-CM | POA: Diagnosis not present

## 2022-07-18 DIAGNOSIS — J449 Chronic obstructive pulmonary disease, unspecified: Secondary | ICD-10-CM | POA: Diagnosis not present

## 2022-07-18 DIAGNOSIS — R609 Edema, unspecified: Secondary | ICD-10-CM | POA: Diagnosis not present

## 2022-07-18 DIAGNOSIS — I10 Essential (primary) hypertension: Secondary | ICD-10-CM | POA: Diagnosis not present

## 2022-07-18 DIAGNOSIS — I2510 Atherosclerotic heart disease of native coronary artery without angina pectoris: Secondary | ICD-10-CM | POA: Diagnosis not present

## 2022-07-18 DIAGNOSIS — Z9889 Other specified postprocedural states: Secondary | ICD-10-CM | POA: Diagnosis not present

## 2022-07-18 DIAGNOSIS — Z8679 Personal history of other diseases of the circulatory system: Secondary | ICD-10-CM | POA: Diagnosis not present

## 2022-07-19 ENCOUNTER — Other Ambulatory Visit: Payer: Self-pay | Admitting: *Deleted

## 2022-07-19 DIAGNOSIS — Z87891 Personal history of nicotine dependence: Secondary | ICD-10-CM

## 2022-07-19 DIAGNOSIS — R911 Solitary pulmonary nodule: Secondary | ICD-10-CM

## 2022-07-19 NOTE — Telephone Encounter (Signed)
Per Dr Ilda Foil note from 07/17/22 he treated pt with Levaquin and wants a 2 mth f/u CT. I went ahead and placed a CT order for then and will make sure at the time that another CT has not been scheduled.

## 2022-08-19 DIAGNOSIS — M79671 Pain in right foot: Secondary | ICD-10-CM | POA: Diagnosis not present

## 2022-08-19 DIAGNOSIS — M79672 Pain in left foot: Secondary | ICD-10-CM | POA: Diagnosis not present

## 2022-08-19 DIAGNOSIS — R42 Dizziness and giddiness: Secondary | ICD-10-CM | POA: Diagnosis not present

## 2022-08-19 DIAGNOSIS — R2689 Other abnormalities of gait and mobility: Secondary | ICD-10-CM | POA: Diagnosis not present

## 2022-08-19 DIAGNOSIS — R531 Weakness: Secondary | ICD-10-CM | POA: Diagnosis not present

## 2022-08-19 DIAGNOSIS — R202 Paresthesia of skin: Secondary | ICD-10-CM | POA: Diagnosis not present

## 2022-08-19 DIAGNOSIS — R200 Anesthesia of skin: Secondary | ICD-10-CM | POA: Diagnosis not present

## 2022-08-19 DIAGNOSIS — R208 Other disturbances of skin sensation: Secondary | ICD-10-CM | POA: Diagnosis not present

## 2022-09-17 ENCOUNTER — Ambulatory Visit
Admission: RE | Admit: 2022-09-17 | Discharge: 2022-09-17 | Disposition: A | Discharge status: Home / Self Care | Payer: 59 | Source: Ambulatory Visit | Attending: Acute Care | Admitting: Acute Care

## 2022-09-17 DIAGNOSIS — I77810 Thoracic aortic ectasia: Secondary | ICD-10-CM

## 2022-09-17 DIAGNOSIS — R911 Solitary pulmonary nodule: Secondary | ICD-10-CM

## 2022-09-17 DIAGNOSIS — Z87891 Personal history of nicotine dependence: Secondary | ICD-10-CM

## 2022-09-17 DIAGNOSIS — Z0389 Encounter for observation for other suspected diseases and conditions ruled out: Secondary | ICD-10-CM | POA: Diagnosis not present

## 2022-09-17 HISTORY — DX: Thoracic aortic ectasia: I77.810

## 2022-09-20 ENCOUNTER — Telehealth: Payer: Self-pay | Admitting: Acute Care

## 2022-09-20 DIAGNOSIS — J449 Chronic obstructive pulmonary disease, unspecified: Secondary | ICD-10-CM | POA: Diagnosis not present

## 2022-09-20 DIAGNOSIS — F17210 Nicotine dependence, cigarettes, uncomplicated: Secondary | ICD-10-CM | POA: Diagnosis not present

## 2022-09-20 NOTE — Telephone Encounter (Signed)
Call repoort

## 2022-09-20 NOTE — Telephone Encounter (Signed)
Patient has appointment with Dr. Meredeth Ide. Will follow up after his review

## 2022-09-20 NOTE — Telephone Encounter (Signed)
Received call report from [facility] with [place] radiology on low dose CT.   Sarah, please advise. Thanks

## 2022-09-25 ENCOUNTER — Telehealth: Payer: Self-pay | Admitting: Acute Care

## 2022-09-25 NOTE — Telephone Encounter (Signed)
Noted. Will hold in nodule pool to follow.

## 2022-09-25 NOTE — Telephone Encounter (Signed)
I have called the patient with the results of the low dose Ct results. He is being followed for his abnormal Ct scans by Dr. Mayo Ao. He has scheduled him for a PET scan , and then I believe is planning on bronchoscopy and biopsy, as there is no significant change after treatment with antibiotics.  We did discuss that his aortic aneurysm is stable, and that he has 3 vessel CAD, and aortic atherosclerosis. He is followed by vascular surgery and cardiology for these findings. He also has notation of fatty liver.  He has follow up with all appropriate specialties. I have included them on this message at his request. Sherre Lain, and Robynn Pane, Please follow Dr. Gus Height findings and if negative we can have patient return to the screening population . Thanks so much        Low Dose CT Chest 09/17/2022 Cardiovascular: Normal heart size. No significant pericardial effusion/thickening. Three-vessel coronary atherosclerosis. Atherosclerotic thoracic aorta with dilated 4.0 cm ascending thoracic aorta. Normal caliber pulmonary arteries.   Mediastinum/Nodes: No significant thyroid nodules. Unremarkable esophagus. No pathologically enlarged axillary, mediastinal or hilar lymph nodes, noting limited sensitivity for the detection of hilar adenopathy on this noncontrast study.   Lungs/Pleura: No pneumothorax. No pleural effusion. Mild centrilobular emphysema with diffuse bronchial wall thickening. No acute consolidative airspace disease or lung masses. Persistent occlusive endobronchial density centrally in the right upper lobe measuring 13.0 mm in volume derived mean diameter (series 3/image 65), not substantially changed from 07/11/2022 CT. Persistent ill-defined solid adjacent posterior right upper lobe nodule measuring 9.3 mm in volume derived mean diameter (series 3/image 58), not substantially changed. No new significant pulmonary nodules.   Upper abdomen: Diffuse hepatic steatosis.    Musculoskeletal: No aggressive appearing focal osseous lesions. Moderate thoracic spondylosis. Chronic ununited posterolateral right eighth rib fracture. Chronic healed posterior right ninth rib fracture.   IMPRESSION: 1. Lung-RADS 4A, suspicious. Follow up low-dose chest CT without contrast in 3 months (please use the following order, "CT CHEST LCS NODULE FOLLOW-UP W/O CM") is recommended. Persistent occlusive endobronchial density centrally in the right upper lobe with adjacent persistent ill-defined posterior right upper lobe 9 mm nodule. No substantial change since recent 07/11/2022 screening chest CT. Endobronchial malignancy not excluded. Consider PET-CT for further characterization and/or bronchoscopic evaluation. 2. Dilated 4.0 cm ascending thoracic aorta, for which 12 month CT follow-up is recommended. 3. Three-vessel coronary atherosclerosis. 4. Diffuse hepatic steatosis. 5. Aortic Atherosclerosis (ICD10-I70.0) and Emphysema (ICD10-J43.9).

## 2022-09-27 ENCOUNTER — Other Ambulatory Visit: Payer: Self-pay | Admitting: Specialist

## 2022-09-27 DIAGNOSIS — R911 Solitary pulmonary nodule: Secondary | ICD-10-CM

## 2022-09-27 DIAGNOSIS — J181 Lobar pneumonia, unspecified organism: Secondary | ICD-10-CM

## 2022-10-01 ENCOUNTER — Ambulatory Visit
Admission: RE | Admit: 2022-10-01 | Discharge: 2022-10-01 | Disposition: A | Discharge status: Home / Self Care | Payer: 59 | Source: Ambulatory Visit | Attending: Specialist | Admitting: Specialist

## 2022-10-01 DIAGNOSIS — I7143 Infrarenal abdominal aortic aneurysm, without rupture: Secondary | ICD-10-CM | POA: Diagnosis not present

## 2022-10-01 DIAGNOSIS — R911 Solitary pulmonary nodule: Secondary | ICD-10-CM | POA: Diagnosis not present

## 2022-10-01 DIAGNOSIS — I2510 Atherosclerotic heart disease of native coronary artery without angina pectoris: Secondary | ICD-10-CM | POA: Insufficient documentation

## 2022-10-01 DIAGNOSIS — R160 Hepatomegaly, not elsewhere classified: Secondary | ICD-10-CM | POA: Diagnosis not present

## 2022-10-01 DIAGNOSIS — I700 Atherosclerosis of aorta: Secondary | ICD-10-CM | POA: Insufficient documentation

## 2022-10-01 DIAGNOSIS — J439 Emphysema, unspecified: Secondary | ICD-10-CM | POA: Insufficient documentation

## 2022-10-01 DIAGNOSIS — K118 Other diseases of salivary glands: Secondary | ICD-10-CM

## 2022-10-01 DIAGNOSIS — K4090 Unilateral inguinal hernia, without obstruction or gangrene, not specified as recurrent: Secondary | ICD-10-CM | POA: Insufficient documentation

## 2022-10-01 DIAGNOSIS — J181 Lobar pneumonia, unspecified organism: Secondary | ICD-10-CM

## 2022-10-01 HISTORY — DX: Other diseases of salivary glands: K11.8

## 2022-10-01 LAB — GLUCOSE, CAPILLARY: Glucose-Capillary: 93 mg/dL (ref 70–99)

## 2022-10-01 MED ORDER — FLUDEOXYGLUCOSE F - 18 (FDG) INJECTION
9.5000 | Freq: Once | INTRAVENOUS | Status: AC | PRN
  Administered 2022-10-01: 9.5 via INTRAVENOUS

## 2022-10-15 NOTE — Telephone Encounter (Signed)
Per telephone note from 10/09/22 from Dr Reita Cliche office. Pt has been referred to ENT for parotid nodule. Dr Meredeth Ide to repeat Chest CT in 4 months. I have placed pt on my follow up list for 4 months.

## 2022-10-21 DIAGNOSIS — H903 Sensorineural hearing loss, bilateral: Secondary | ICD-10-CM | POA: Diagnosis not present

## 2022-10-21 DIAGNOSIS — D110 Benign neoplasm of parotid gland: Secondary | ICD-10-CM | POA: Diagnosis not present

## 2022-10-21 DIAGNOSIS — H6123 Impacted cerumen, bilateral: Secondary | ICD-10-CM | POA: Diagnosis not present

## 2022-10-21 DIAGNOSIS — H90A22 Sensorineural hearing loss, unilateral, left ear, with restricted hearing on the contralateral side: Secondary | ICD-10-CM | POA: Diagnosis not present

## 2022-10-29 ENCOUNTER — Other Ambulatory Visit: Payer: Self-pay | Admitting: Otolaryngology

## 2022-10-29 DIAGNOSIS — K118 Other diseases of salivary glands: Secondary | ICD-10-CM

## 2022-10-30 NOTE — Progress Notes (Signed)
Scott Balm, MD sent to Scott [name] S PROCEDURE / BIOPSY REVIEW Date: 10/25/22  Requested Biopsy site: L parotid nodule   Reason for request: PET+ Imaging review: Best seen on PET 10/01/22  Decision: Approved Imaging modality to perform: Ultrasound Schedule with: No sedation / Local anesthetic Schedule for: Any VIR  Additional comments: @VIR : hard to see discrete lesion on correlative noncon CT  Please contact me with questions, concerns, or if issue pertaining to this request arise.  Dayne Scott Balm, MD [facility]

## 2022-11-01 DIAGNOSIS — I10 Essential (primary) hypertension: Secondary | ICD-10-CM | POA: Diagnosis not present

## 2022-11-01 DIAGNOSIS — E538 Deficiency of other specified B group vitamins: Secondary | ICD-10-CM | POA: Diagnosis not present

## 2022-11-01 DIAGNOSIS — E559 Vitamin D deficiency, unspecified: Secondary | ICD-10-CM | POA: Diagnosis not present

## 2022-11-01 DIAGNOSIS — E7800 Pure hypercholesterolemia, unspecified: Secondary | ICD-10-CM | POA: Diagnosis not present

## 2022-11-01 DIAGNOSIS — J309 Allergic rhinitis, unspecified: Secondary | ICD-10-CM | POA: Diagnosis not present

## 2022-11-01 DIAGNOSIS — F419 Anxiety disorder, unspecified: Secondary | ICD-10-CM | POA: Diagnosis not present

## 2022-11-01 DIAGNOSIS — I471 Supraventricular tachycardia, unspecified: Secondary | ICD-10-CM | POA: Diagnosis not present

## 2022-11-01 DIAGNOSIS — Z09 Encounter for follow-up examination after completed treatment for conditions other than malignant neoplasm: Secondary | ICD-10-CM | POA: Diagnosis not present

## 2022-11-01 DIAGNOSIS — I25118 Atherosclerotic heart disease of native coronary artery with other forms of angina pectoris: Secondary | ICD-10-CM | POA: Diagnosis not present

## 2022-11-01 DIAGNOSIS — R7303 Prediabetes: Secondary | ICD-10-CM | POA: Diagnosis not present

## 2022-11-01 DIAGNOSIS — J449 Chronic obstructive pulmonary disease, unspecified: Secondary | ICD-10-CM | POA: Diagnosis not present

## 2022-11-04 NOTE — Progress Notes (Signed)
Patient for US guided core LT Parotid gland biopsy on Tues 11/05/2022, I called and spoke with the patient on the phone and gave pre-procedure instructions. Pt was made aware to be here at 12:30p and check in at the [facility] registration desk. Pt stated understanding.  Called 11/04/2022

## 2022-11-05 ENCOUNTER — Other Ambulatory Visit: Payer: Self-pay | Admitting: Otolaryngology

## 2022-11-05 ENCOUNTER — Ambulatory Visit
Admission: RE | Admit: 2022-11-05 | Discharge: 2022-11-05 | Disposition: A | Discharge status: Home / Self Care | Payer: 59 | Source: Ambulatory Visit | Attending: Otolaryngology | Admitting: Otolaryngology

## 2022-11-05 DIAGNOSIS — K118 Other diseases of salivary glands: Secondary | ICD-10-CM | POA: Insufficient documentation

## 2022-11-05 DIAGNOSIS — R599 Enlarged lymph nodes, unspecified: Secondary | ICD-10-CM | POA: Diagnosis not present

## 2022-11-05 MED ORDER — LIDOCAINE HCL (PF) 1 % IJ SOLN
10.0000 mL | Freq: Once | INTRAMUSCULAR | Status: AC
  Administered 2022-11-05: 10 mL via SUBCUTANEOUS
  Filled 2022-11-05: qty 10

## 2022-11-05 NOTE — Procedures (Signed)
Interventional Radiology Procedure:   Indications: Hypermetabolic left parotid nodule on PET CT  Procedure: US guided core of left parotid nodule and adjacent lymph node  Findings: Small left parotid with an adjacent lymph node in the inferior left parotid gland.  Core biopsies obtained from both.    Complications: None     EBL: Minimal  Plan: Discharge to home.   Makinzi Prieur R. Lowella Dandy, MD  Pager: [phone]

## 2022-11-05 NOTE — Discharge Instructions (Signed)
Instructions discussed with patient. PCS 

## 2022-11-20 DIAGNOSIS — R918 Other nonspecific abnormal finding of lung field: Secondary | ICD-10-CM | POA: Diagnosis not present

## 2022-11-20 DIAGNOSIS — J449 Chronic obstructive pulmonary disease, unspecified: Secondary | ICD-10-CM | POA: Diagnosis not present

## 2022-11-20 DIAGNOSIS — R053 Chronic cough: Secondary | ICD-10-CM | POA: Diagnosis not present

## 2022-11-22 ENCOUNTER — Other Ambulatory Visit (INDEPENDENT_AMBULATORY_CARE_PROVIDER_SITE_OTHER): Payer: Self-pay | Admitting: Vascular Surgery

## 2022-11-22 DIAGNOSIS — I6523 Occlusion and stenosis of bilateral carotid arteries: Secondary | ICD-10-CM

## 2022-11-26 ENCOUNTER — Ambulatory Visit (INDEPENDENT_AMBULATORY_CARE_PROVIDER_SITE_OTHER): Payer: 59

## 2022-11-26 ENCOUNTER — Encounter (INDEPENDENT_AMBULATORY_CARE_PROVIDER_SITE_OTHER): Payer: 59

## 2022-11-26 ENCOUNTER — Ambulatory Visit (INDEPENDENT_AMBULATORY_CARE_PROVIDER_SITE_OTHER): Payer: 59 | Admitting: Nurse Practitioner

## 2022-11-26 ENCOUNTER — Encounter (INDEPENDENT_AMBULATORY_CARE_PROVIDER_SITE_OTHER): Payer: Self-pay | Admitting: Vascular Surgery

## 2022-11-26 ENCOUNTER — Ambulatory Visit (INDEPENDENT_AMBULATORY_CARE_PROVIDER_SITE_OTHER): Payer: 59 | Admitting: Vascular Surgery

## 2022-11-26 VITALS — BP 169/97 | HR 72 | Resp 18 | Ht 70.0 in | Wt 179.8 lb

## 2022-11-26 DIAGNOSIS — I7143 Infrarenal abdominal aortic aneurysm, without rupture: Secondary | ICD-10-CM

## 2022-11-26 DIAGNOSIS — I10 Essential (primary) hypertension: Secondary | ICD-10-CM | POA: Diagnosis not present

## 2022-11-26 DIAGNOSIS — E7800 Pure hypercholesterolemia, unspecified: Secondary | ICD-10-CM | POA: Diagnosis not present

## 2022-11-26 DIAGNOSIS — I890 Lymphedema, not elsewhere classified: Secondary | ICD-10-CM | POA: Diagnosis not present

## 2022-11-26 DIAGNOSIS — I6523 Occlusion and stenosis of bilateral carotid arteries: Secondary | ICD-10-CM

## 2022-11-26 DIAGNOSIS — I70229 Atherosclerosis of native arteries of extremities with rest pain, unspecified extremity: Secondary | ICD-10-CM

## 2022-11-26 NOTE — Assessment & Plan Note (Signed)
Left leg swelling.  Compression, elevation, and activity are important to keep the swelling under control.

## 2022-11-26 NOTE — Assessment & Plan Note (Signed)
lipid control important in reducing the progression of atherosclerotic disease. Continue statin therapy  

## 2022-11-26 NOTE — Assessment & Plan Note (Signed)
Duplex today shows growth of his abdominal aortic aneurysm up to 4.4 cm in maximal diameter.  This was 4 cm 6 months ago.  This was 4.3 cm on the PET scan a couple of months ago as well.  He is getting CT or PET scans every few months and we can follow this as well as 33-month duplex in our office.  We discussed that 5 cm would be our typical size for prophylactic repair and if he continues to grow, this may be something we have to discuss in the future.

## 2022-11-26 NOTE — Progress Notes (Signed)
MRN : [id number]  Scott [name] is a 62 y.o. (1960-10-07) male who presents with chief complaint of  Chief Complaint  Patient presents with   Follow-up    6 month aaa + abi .  Marland Kitchen  History of Present Illness: Patient returns today in follow up of multiple vascular issues.  He is having worsening left leg swelling for a variety of reasons.  He has lymphedema after his extensive revascularization of that leg for rest pain in the past.  He has also had plantars warts removed earlier this year and that seems to have flared up his swelling.  He intermittently is worn his compression socks and does try to elevate his legs.  No new ulceration or infection. As for his PAD, he has some neuropathy but overall his symptoms are stable.  No rest pain or ulceration.  His ABIs today are slightly decreased on the right at 0.75 and stable and normal on the left at 1.03.  Digit pressures are 123 on the right and 163 on the left.  Current Outpatient Medications  Medication Sig Dispense Refill   albuterol (VENTOLIN HFA) 108 (90 Base) MCG/ACT inhaler Inhale 2 puffs into the lungs every 6 (six) hours as needed.     atorvastatin (LIPITOR) 40 MG tablet Take 40 mg by mouth daily.     budesonide-formoterol (SYMBICORT) 160-4.5 MCG/ACT inhaler Inhale 2 puffs into the lungs.     cholecalciferol (VITAMIN D3) 25 MCG (1000 UNIT) tablet Take 1,000 Units by mouth daily.     cloNIDine (CATAPRES) 0.1 MG tablet Take 0.1 mg by mouth 2 (two) times daily.     clopidogrel (PLAVIX) 75 MG tablet Take 75 mg by mouth daily.     ipratropium-albuterol (DUONEB) 0.5-2.5 (3) MG/3ML SOLN Inhale 3 mLs into the lungs.     isosorbide mononitrate (IMDUR) 30 MG 24 hr tablet Take 30 mg by mouth daily.  5   lisinopril-hydrochlorothiazide (ZESTORETIC) 20-12.5 MG tablet Take 1 tablet by mouth daily.     metoprolol succinate (TOPROL-XL) 25 MG 24 hr tablet Take 25 mg by mouth daily.      vitamin B-12 (CYANOCOBALAMIN) 500 MCG tablet Take 500 mcg by  mouth daily.     No current facility-administered medications for this visit.    Past Medical History:  Diagnosis Date   Anxiety    Arthritis    COPD (chronic obstructive pulmonary disease) (HCC)    Coronary artery disease    COVID-19 2022   Depression    Dyspnea    Dysrhythmia    Hypertension    Pre-diabetes     Past Surgical History:  Procedure Laterality Date   CHOLECYSTECTOMY     CHOLECYSTECTOMY  2000   ENDARTERECTOMY FEMORAL Bilateral 12/29/2020   Procedure: ENDARTERECTOMY FEMORAL;  Surgeon: Annice Needy, MD;  Location: [facility];  Service: Vascular;  Laterality: Bilateral;  Left SFA and Tibial intervention   LEFT HEART CATH AND CORONARY ANGIOGRAPHY Left 06/03/2017   Procedure: LEFT HEART CATH AND CORONARY ANGIOGRAPHY;  Surgeon: Alwyn Pea, MD;  Location: [facility];  Service: Cardiovascular;  Laterality: Left;   LEFT HEART CATH AND CORONARY ANGIOGRAPHY N/A 08/22/2021   Procedure: LEFT HEART CATH AND CORONARY ANGIOGRAPHY;  Surgeon: Alwyn Pea, MD;  Location: [facility];  Service: Cardiovascular;  Laterality: N/A;   LOWER EXTREMITY ANGIOGRAPHY Left 02/03/2020   Procedure: LOWER EXTREMITY ANGIOGRAPHY;  Surgeon: Annice Needy, MD;  Location: [facility];  Service:  Cardiovascular;  Laterality: Left;   TONSILLECTOMY       Social History   Tobacco Use   Smoking status: Every Day    Current packs/day: 1.00    Types: Cigarettes   Smokeless tobacco: Never  Substance Use Topics   Alcohol use: Yes    Comment: 3-4 beers per day   Drug use: No      Family History  Problem Relation Age of Onset   Diabetes Mother    Hypertension Mother    Lupus Mother    Heart attack Father    Cancer Brother    Cancer Brother      Allergies  Allergen Reactions   Acetaminophen-Codeine Other (See Comments)    Nausea and "hot flash"    Chantix [Varenicline] Nausea Only    Sour taste in mouth   Codeine Nausea Only    REVIEW OF SYSTEMS  (Negative unless checked)   Constitutional: [] Weight loss  [] Fever  [] Chills Cardiac: [] Chest pain   [] Chest pressure   [] Palpitations   [] Shortness of breath when laying flat   [] Shortness of breath at rest   [] Shortness of breath with exertion. Vascular:  [] Pain in legs with walking   [x] Pain in legs at rest   [] Pain in legs when laying flat   [] Claudication   [] Pain in feet when walking  [] Pain in feet at rest  [] Pain in feet when laying flat   [] History of DVT   [] Phlebitis   [x] Swelling in legs   [] Varicose veins   [] Non-healing ulcers Pulmonary:   [] Uses home oxygen   [] Productive cough   [] Hemoptysis   [] Wheeze  [x] COPD   [] Asthma Neurologic:  [] Dizziness  [] Blackouts   [] Seizures   [] History of stroke   [] History of TIA  [] Aphasia   [] Temporary blindness   [] Dysphagia   [] Weakness or numbness in arms   [x] Weakness or numbness in legs Musculoskeletal:  [x] Arthritis   [] Joint swelling   [x] Joint pain   [] Low back pain Hematologic:  [] Easy bruising  [] Easy bleeding   [] Hypercoagulable state   [] Anemic   Gastrointestinal:  [] Blood in stool   [] Vomiting blood  [] Gastroesophageal reflux/heartburn   [] Abdominal pain Genitourinary:  [] Chronic kidney disease   [] Difficult urination  [] Frequent urination  [] Burning with urination   [] Hematuria Skin:  [] Rashes   [] Ulcers   [] Wounds Psychological:  [x] History of anxiety   []  History of major depression.  Physical Examination  BP (!) 169/97 (BP Location: Right Arm)   Pulse 72   Resp 18   Ht 5\' 10"  (1.778 m)   Wt 179 lb 12.8 oz (81.6 kg)   BMI 25.80 kg/m  Gen:  WD/WN, NAD Head: /AT, No temporalis wasting. Ear/Nose/Throat: Hearing grossly intact, nares w/o erythema or drainage Eyes: Conjunctiva clear. Sclera non-icteric Neck: Supple.  Trachea midline Pulmonary:  Good air movement, no use of accessory muscles.  Cardiac: RRR, no JVD Vascular:  Vessel Right Left  Radial Palpable Palpable                          PT 1+ Palpable Not  Palpable  DP 1+ Palpable 1+ Palpable   Gastrointestinal: soft, non-tender/non-distended. No guarding/reflex.  Musculoskeletal: M/S 5/5 throughout.  No deformity or atrophy. 2+ LLE edema.  Some stasis dermatitis changes with mild to moderate hyperpigmentation is also present. Neurologic: Sensation grossly intact in extremities.  Symmetrical.  Speech is fluent.  Psychiatric: Judgment intact, Mood & affect appropriate for pt's clinical  situation. Dermatologic: No rashes or ulcers noted.  No cellulitis or open wounds.      Labs Recent Results (from the past 2160 hour(s))  Glucose, capillary     Status: None   Collection Time: 10/01/22 10:17 AM  Result Value Ref Range   Glucose-Capillary 93 70 - 99 mg/dL    Comment: Glucose reference range applies only to samples taken after fasting for at least 8 hours.    Radiology [place] CORE BIOPSY (SALIVARY GLAND/PAROTID GLAND)  Result Date: 11/05/2022 INDICATION: 62 year old with hypermetabolic left parotid nodule on PET-CT. EXAM: 1. Ultrasound-guided core biopsy of left parotid nodule. 2. Ultrasound-guided core biopsy of left parotid lymph node MEDICATIONS: 1% lidocaine for local anesthetic ANESTHESIA/SEDATION: None FLUOROSCOPY TIME:  None COMPLICATIONS: None immediate. PROCEDURE: Informed written consent was obtained from the patient after a thorough discussion of the procedural risks, benefits and alternatives. All questions were addressed. A timeout was performed prior to the initiation of the procedure. Ultrasound of the left parotid gland demonstrateda small nodule in the inferior left parotid gland with an adjacent lymph node. Based on my review of the PET-CT, it is possible that both of these parotid lesions could have been hypermetabolic. Therefore, we proceeded with biopsying both lesions. The left side of the neck was prepped with chlorhexidine and sterile field was created. Skin was anesthetized using 1% lidocaine. Small incision was made. Using  ultrasound guidance, a 17 gauge coaxial needle was directed into the small parotid nodule. Total of 4 core biopsies were obtained from the parotid nodule using 18 gauge device. Specimens placed on a Telfa pad with saline. The 17 gauge coaxial needle was removed. A new 17 gauge coaxial needle was directed into the left parotid lymph node with ultrasound guidance. Total of 4 core biopsies were obtained in the lymph node with 18 gauge device. Specimens placed on Telfa pad with saline. 17 gauge needle was removed without complication. Bandage was placed. FINDINGS: Small hypoechoic nodule in the posteroinferior aspect of the left parotid gland that appears to correspond with the hypermetabolic area. This parotid nodule measures 0.7 x 0.5 x 0.5 cm. Just lateral and posterior to the parotid nodule, there is a prominent hypoechoic intraparotid lymph node that measured 1.3 x 0.6 x 0.9 cm. Biopsy needles confirmed within both lesions. No immediate bleeding or hematoma formation. IMPRESSION: 1. Ultrasound-guided core biopsy of left parotid nodule. 2. Ultrasound-guided core biopsy of left parotid lymph node. Electronically Signed   By: Richarda Overlie M.D.   On: 11/05/2022 16:05   [place] FNA BIOPSY SOFT TISSUE, EA ADDT'L LESION  Result Date: 11/05/2022 INDICATION: 62 year old with hypermetabolic left parotid nodule on PET-CT. EXAM: 1. Ultrasound-guided core biopsy of left parotid nodule. 2. Ultrasound-guided core biopsy of left parotid lymph node MEDICATIONS: 1% lidocaine for local anesthetic ANESTHESIA/SEDATION: None FLUOROSCOPY TIME:  None COMPLICATIONS: None immediate. PROCEDURE: Informed written consent was obtained from the patient after a thorough discussion of the procedural risks, benefits and alternatives. All questions were addressed. A timeout was performed prior to the initiation of the procedure. Ultrasound of the left parotid gland demonstrateda small nodule in the inferior left parotid gland with an adjacent lymph  node. Based on my review of the PET-CT, it is possible that both of these parotid lesions could have been hypermetabolic. Therefore, we proceeded with biopsying both lesions. The left side of the neck was prepped with chlorhexidine and sterile field was created. Skin was anesthetized using 1% lidocaine. Small incision was made. Using ultrasound guidance, a 17  gauge coaxial needle was directed into the small parotid nodule. Total of 4 core biopsies were obtained from the parotid nodule using 18 gauge device. Specimens placed on a Telfa pad with saline. The 17 gauge coaxial needle was removed. A new 17 gauge coaxial needle was directed into the left parotid lymph node with ultrasound guidance. Total of 4 core biopsies were obtained in the lymph node with 18 gauge device. Specimens placed on Telfa pad with saline. 17 gauge needle was removed without complication. Bandage was placed. FINDINGS: Small hypoechoic nodule in the posteroinferior aspect of the left parotid gland that appears to correspond with the hypermetabolic area. This parotid nodule measures 0.7 x 0.5 x 0.5 cm. Just lateral and posterior to the parotid nodule, there is a prominent hypoechoic intraparotid lymph node that measured 1.3 x 0.6 x 0.9 cm. Biopsy needles confirmed within both lesions. No immediate bleeding or hematoma formation. IMPRESSION: 1. Ultrasound-guided core biopsy of left parotid nodule. 2. Ultrasound-guided core biopsy of left parotid lymph node. Electronically Signed   By: Richarda Overlie M.D.   On: 11/05/2022 16:05    Assessment/Plan  Hypertension blood pressure control important in reducing the progression of atherosclerotic disease. On appropriate oral medications.   Hypercholesterolemia lipid control important in reducing the progression of atherosclerotic disease. Continue statin therapy   Atherosclerosis of artery of extremity with rest pain (HCC) His ABIs today are slightly decreased on the right at 0.75 and stable and  normal on the left at 1.03.  Digit pressures are 123 on the right and 163 on the left.  No current worrisome symptoms of ischemia.  Continue current medical regimen.  Recheck in 6 months with noninvasive studies.  AAA (abdominal aortic aneurysm) without rupture (HCC) Duplex today shows growth of his abdominal aortic aneurysm up to 4.4 cm in maximal diameter.  This was 4 cm 6 months ago.  This was 4.3 cm on the PET scan a couple of months ago as well.  He is getting CT or PET scans every few months and we can follow this as well as 11-month duplex in our office.  We discussed that 5 cm would be our typical size for prophylactic repair and if he continues to grow, this may be something we have to discuss in the future.  Lymphedema Left leg swelling.  Compression, elevation, and activity are important to keep the swelling under control.    Festus Barren, MD  11/26/2022 9:39 AM    This note was created with Dragon medical transcription system.  Any errors from dictation are purely unintentional

## 2022-11-26 NOTE — Assessment & Plan Note (Signed)
blood pressure control important in reducing the progression of atherosclerotic disease. On appropriate oral medications.  

## 2022-11-26 NOTE — Assessment & Plan Note (Signed)
His ABIs today are slightly decreased on the right at 0.75 and stable and normal on the left at 1.03.  Digit pressures are 123 on the right and 163 on the left.  No current worrisome symptoms of ischemia.  Continue current medical regimen.  Recheck in 6 months with noninvasive studies.

## 2022-12-02 ENCOUNTER — Other Ambulatory Visit: Payer: Self-pay | Admitting: Specialist

## 2022-12-02 DIAGNOSIS — R918 Other nonspecific abnormal finding of lung field: Secondary | ICD-10-CM

## 2023-01-02 ENCOUNTER — Ambulatory Visit
Admission: RE | Admit: 2023-01-02 | Discharge: 2023-01-02 | Disposition: A | Discharge status: Home / Self Care | Payer: 59 | Source: Ambulatory Visit | Attending: Specialist | Admitting: Specialist

## 2023-01-02 DIAGNOSIS — R918 Other nonspecific abnormal finding of lung field: Secondary | ICD-10-CM | POA: Insufficient documentation

## 2023-01-02 DIAGNOSIS — R911 Solitary pulmonary nodule: Secondary | ICD-10-CM | POA: Diagnosis not present

## 2023-01-02 DIAGNOSIS — I7143 Infrarenal abdominal aortic aneurysm, without rupture: Secondary | ICD-10-CM | POA: Diagnosis not present

## 2023-01-02 DIAGNOSIS — J432 Centrilobular emphysema: Secondary | ICD-10-CM | POA: Diagnosis not present

## 2023-01-06 DIAGNOSIS — J449 Chronic obstructive pulmonary disease, unspecified: Secondary | ICD-10-CM | POA: Diagnosis not present

## 2023-01-16 DIAGNOSIS — K4090 Unilateral inguinal hernia, without obstruction or gangrene, not specified as recurrent: Secondary | ICD-10-CM | POA: Insufficient documentation

## 2023-01-24 DIAGNOSIS — R600 Localized edema: Secondary | ICD-10-CM | POA: Diagnosis not present

## 2023-01-24 DIAGNOSIS — Z9889 Other specified postprocedural states: Secondary | ICD-10-CM | POA: Diagnosis not present

## 2023-01-24 DIAGNOSIS — I10 Essential (primary) hypertension: Secondary | ICD-10-CM | POA: Diagnosis not present

## 2023-01-24 DIAGNOSIS — M79605 Pain in left leg: Secondary | ICD-10-CM | POA: Diagnosis not present

## 2023-01-24 DIAGNOSIS — J449 Chronic obstructive pulmonary disease, unspecified: Secondary | ICD-10-CM | POA: Diagnosis not present

## 2023-01-24 DIAGNOSIS — I2510 Atherosclerotic heart disease of native coronary artery without angina pectoris: Secondary | ICD-10-CM | POA: Diagnosis not present

## 2023-01-24 DIAGNOSIS — I70213 Atherosclerosis of native arteries of extremities with intermittent claudication, bilateral legs: Secondary | ICD-10-CM | POA: Diagnosis not present

## 2023-01-24 DIAGNOSIS — I471 Supraventricular tachycardia, unspecified: Secondary | ICD-10-CM | POA: Diagnosis not present

## 2023-01-24 DIAGNOSIS — Z86718 Personal history of other venous thrombosis and embolism: Secondary | ICD-10-CM | POA: Diagnosis not present

## 2023-01-24 DIAGNOSIS — I779 Disorder of arteries and arterioles, unspecified: Secondary | ICD-10-CM | POA: Diagnosis not present

## 2023-01-24 DIAGNOSIS — Z95828 Presence of other vascular implants and grafts: Secondary | ICD-10-CM | POA: Diagnosis not present

## 2023-01-24 DIAGNOSIS — Z720 Tobacco use: Secondary | ICD-10-CM | POA: Diagnosis not present

## 2023-01-28 ENCOUNTER — Ambulatory Visit: Payer: Self-pay | Admitting: General Surgery

## 2023-01-28 DIAGNOSIS — K4090 Unilateral inguinal hernia, without obstruction or gangrene, not specified as recurrent: Secondary | ICD-10-CM | POA: Diagnosis not present

## 2023-01-28 NOTE — H&P (View-Only) (Signed)
PATIENT PROFILE: Scott [name] is a 62 y.o. male who presents to the Clinic for consultation at the request of [name], NP for evaluation of inguinal hernia.  PCP:  Mechele Collin, NP  HISTORY OF PRESENT ILLNESS: [name] reports feeling a lump in the right groin since January 2024.  He endorses that the lump is increasing in size.  Sometimes he feels a burning pain on the right groin.  No pain radiation.  Pain aggravated by certain activities.  Patient denies any episode of abdominal distention, nausea or vomiting.  Patient had a PET scan to follow-up with pulmonary nodules and there was identified possible bilateral inguinal hernia, bigger on the right side.  I personally evaluated the images   PROBLEM LIST: Problem List  Date Reviewed: 09/20/2022          Noted   Right inguinal hernia 01/16/2023   Overview    Contains small bowel See PET scan 6/24      Left inguinal hernia 01/16/2023   Overview    Containing fat See PET Scan 6/24      Lymphedema 11/26/2022   Overview    Last Assessment & Plan:   Formatting of this note might be different from the original.  Left leg swelling.  Compression, elevation, and activity are important to keep the swelling under control.      Chronic pain in left foot 06/14/2022   Suspected cerebrovascular accident 05/03/2022   Peripheral edema 03/19/2021   H/O deep venous thrombosis 01/09/2021   Overview    Last Assessment & Plan:  Formatting of this note might be different from the original. This may very well been present prior to his revascularization due to the poor flow as he had marked swelling at that time 2.  He is on anticoagulation now which is more than therapy for his DVT.      AAA (abdominal aortic aneurysm) without rupture (CMS-HCC) 11/28/2020   Overview    Last Assessment & Plan:  Formatting of this note might be different from the original. Although I measured out in transverse that over 4 cm, it still less than 5 cm and does  not need repaired at this time. Last Assessment & Plan:   Formatting of this note might be different from the original.  I then dependently reviewed his CT angiogram.  His maximal aortic diameter measured approximately 4.2 cm.  At this point, he does not meet threshold size for prophylactic repair but we will want to follow this on 64-month intervals going forward.  I have discussed that this is unlikely to be a cause of any symptoms or problems at this point, but we will continue to monitor this.  I have discussed the primary risk factors for growth would be his continued tobacco use as well as his poorly controlled hypertension.  Return in 6 months with duplex.      Carotid stenosis 11/28/2020   Overview    Last Assessment & Plan:  Formatting of this note might be different from the original. Duplex today shows mild carotid disease in the 1 to 39% range bilaterally.  Mild disease on appropriate medical therapy.  This can be checked in 2 years.      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 Rest pain is improved after revascularization, but he has significant reperfusion swelling and now has  a tibial DVT which was likely present before his revascularization as he already had marked swelling and profound ischemia for a couple weeks.  He will return in a couple weeks with ABIs.  Last Assessment & Plan:  Formatting of this note might be different from the original. ABIs today are normal at 1.09 on the right and 1.00 on the left with triphasic waveforms and normal digital pressures consistent with no arterial insufficiency.  His perfusion is now excellent.  He still has a lot of neuropathic pain but he was profoundly ischemic for an extended time prior to his revascularization and presentation.  We will plan on seeing him back in about 3 months with noninvasive  studies.  Continue to try to ambulate and exercise is much as possible.  Continue current medical regimen. Last Assessment & Plan:   Formatting of this note might be different from the original.  ABIs are stable at last check with good perfusion.  Had extensive left lower extremity revascularization about a year ago.  Recheck in 6 months.  >>OVERVIEW FOR ATHEROSCLEROSIS OF NATIVE ARTERIES OF EXTREMITIES WITH REST PAIN, UNSPECIFIED EXTREMITY (CMS-HCC) WRITTEN ON 05/03/2022  2:05 PM BY COWARD, SHANNON HIATT, NP  Last Assessment & Plan:  Formatting of this note might be different from the original. I have independently reviewed his CT scan.  He has what appears to be a new left common femoral artery occlusion as well as occlusion of his left SFA interventions.  His right common femoral artery is highly stenotic as is the origin of the SFA.  His symptoms have begun new on the left leg and he clearly has ischemic rest pain requiring dangling of his foot and this is a dramatic change from only about a month ago in the office.  This is a critical and limb threatening situation.  We have discussed the grave nature of the situation we will get him on the schedule this week for bilateral femoral endarterectomies and left SFA and tibial intervention.  We have discussed that his aneurysm is still less than 5 cm and does not need to be repaired at this time which would add a layer of complexity to his already difficult situation.  I have discussed the pathophysiology and natural history of peripheral arterial disease and I have discussed the reason and rationale for treatment as soon as possible.  He voices understanding and is agreeable to proceed.  >>OVERVIEW FOR ATHEROSCLEROSIS OF NATIVE ARTERIES OF EXTREMITY WITH INTERMITTENT CLAUDICATION (CMS-HCC) WRITTEN ON 01/12/2021  2:01 PM BY LEACH, SHANNON HIATT, NP  Last Assessment & Plan:  Formatting of this note might be different from the original. an arterial duplex  at the hospital which I have reviewed which showed a left SFA occlusion and suggested potential inflow disease on the left side as well.  No significant disease was noted on the duplex in the right leg. Recommend:  The patient has experienced increased symptoms and is now describing lifestyle limiting claudication and mild rest pain.  Given the severity of the patient's lower extremity symptoms the patient should undergo angiography and intervention.  Risk and benefits were reviewed the patient.  Indications for the procedure were reviewed.  All questions were answered, the patient agrees to proceed.   The patient should continue walking and begin a more formal exercise program.  The patient should continue antiplatelet therapy and aggressive treatment of the lipid abnormalities  The patient will follow up with me after the angiogram.  Last Assessment & Plan:  Formatting of this note might be different from the original. ABIs today are 0.75 on the right which is a drop from 0.9 at his last visit.  Left ABI is 1.15 with triphasic waveforms and normal digital pressures.  Does still have claudication symptoms on the right and has had a drop in his ABI, but his symptoms are reasonably stable.  We discussed if these were lifestyle limiting, intervention would certainly be appropriate.  He does not think they are lifestyle limiting at this time so we will continue to monitor this and I will plan to see him back in 6 months.  Continue current medical regimen.      Vitamin B12 deficiency 09/02/2018   Vitamin D deficiency 09/02/2018   Overview    Severe- Vitamin D 9.2 (09/01/2018)      Chest congestion 05/27/2018   High risk medication use 03/03/2018   Overview    lovastatin      Arthritis involving multiple sites 03/28/2017   Overview    Bilateral hands, family h/o RA - offered rheumatology lab work up, patient declined      Intermittent chest pain 03/28/2017   Status post ablation of ventricular  arrhythmia 03/28/2017   Family history of rheumatoid arthritis 03/28/2017   Dupuytren's contracture of both hands 03/28/2017   Daily consumption of alcohol 03/28/2017   SVT (supraventricular tachycardia) (CMS/HHS-HCC) 12/20/2015   Overview    S/p ablation at [facility] 02/28/16      Cigarette smoker 06/19/2015   Overview    Formatting of this note might be different from the original. Last Assessment & Plan:  Formatting of this note might be different from the original. Still smoking. Has not cut back any  Last Assessment & Plan:  Formatting of this note might be different from the original. We discussed this is a primary atherosclerotic risk factor and smoking cessation would reduce his risk of atherosclerotic disease.  It also affects the patency of any intervention.  Smoking cessation strongly recommended.      Hypercholesterolemia 06/19/2015   Overview    Formatting of this note might be different from the original. Last Assessment & Plan:  Formatting of this note might be different from the original. Doing well. Pt is taking medications as instructed. No complaints of myalgias.  Last Assessment & Plan:  Formatting of this note might be different from the original. lipid control important in reducing the progression of atherosclerotic disease. Continue statin therapy      Hypertension 12/03/2013   CAD (coronary artery disease) 12/03/2013   Anxiety 12/03/2013   Allergic rhinitis 12/03/2013   COPD (chronic obstructive pulmonary disease) (CMS/HHS-HCC) 12/03/2013   Overview    Declined treatment 03/2017 and Pulmonology referral       GENERAL REVIEW OF SYSTEMS:   General ROS: negative for - chills, fatigue, fever, weight gain or weight loss Allergy and Immunology ROS: negative for - hives  Hematological and Lymphatic ROS: negative for - bleeding problems or bruising, negative for palpable nodes Endocrine ROS: negative for - heat or cold intolerance, hair changes Respiratory ROS:  negative for - shortness of breath or wheezing.  Positive for coughing Cardiovascular ROS: no chest pain or palpitations GI ROS: negative for nausea, vomiting, abdominal pain, diarrhea, constipation Musculoskeletal ROS: negative for - joint swelling or muscle pain Neurological ROS: negative for - confusion, syncope Dermatological ROS: negative for pruritus and rash Psychiatric: negative for anxiety, depression, difficulty sleeping and memory loss  MEDICATIONS: Current Outpatient Medications  Medication Sig  Dispense Refill   albuterol MDI, PROVENTIL, VENTOLIN, PROAIR, HFA 90 mcg/actuation inhaler Inhale 2 inhalations into the lungs every 6 (six) hours as needed 18 each 3   budesonide-formoteroL (SYMBICORT) 160-4.5 mcg/actuation inhaler Inhale 2 inhalations into the lungs 2 (two) times daily 30.6 g 3   cholecalciferol (VITAMIN D3) 5,000 unit capsule Take 1 capsule (5,000 Units total) by mouth once daily for Vitamin D Deficiency. 360 capsule 11   cloNIDine HCL (CATAPRES) 0.1 MG tablet Take 1 tablet (0.1 mg total) by mouth 2 (two) times daily 60 tablet 11   clopidogreL (PLAVIX) 75 mg tablet take 1 tablet by mouth every day 90 tablet 3   cyanocobalamin (VITAMIN B12) 1000 MCG tablet Take 2 tablets daily for 2 weeks, then reduce to 1 tablet daily thereafter for Vitamin B12 Deficiency. 360 tablet 11   FUROsemide (LASIX) 20 MG tablet Take 1 tablet (20 mg total) by mouth once daily 90 tablet 3   ipratropium-albuteroL (DUO-NEB) nebulizer solution Take 3 mLs by nebulization 4 (four) times daily for 360 days 360 mL 4   lisinopriL-hydroCHLOROthiazide (ZESTORETIC) 20-12.5 mg tablet Take 2 tablets by mouth 2 (two) times daily 240 tablet 5   metoprolol succinate (TOPROL-XL) 100 MG XL tablet TAKE 1 TABLET BY MOUTH EVERY DAY 90 tablet 1   atorvastatin (LIPITOR) 40 MG tablet Take 1 tablet (40 mg total) by mouth once daily (Patient not taking: Reported on 01/24/2023) 90 tablet 3   isosorbide mononitrate (IMDUR) 60  MG ER tablet Take 1 tablet (60 mg total) by mouth once daily (Patient not taking: Reported on 01/24/2023) 90 tablet 3   No current facility-administered medications for this visit.    ALLERGIES: Tylenol-codeine solution, Acetaminophen-codeine, Chantix [varenicline], Gabapentin, and Lyrica [pregabalin]  PAST MEDICAL HISTORY: Past Medical History:  Diagnosis Date   Allergic rhinitis    Allergic state    Anxiety    Arrhythmia    Arthritis    CAD (coronary artery disease)    COPD (chronic obstructive pulmonary disease) (CMS/HHS-HCC)    COPD (chronic obstructive pulmonary disease) (CMS/HHS-HCC) 12/03/2013   Hyperlipidemia    Hypertension    Rib pain on right side 05/27/2018    PAST SURGICAL HISTORY: Past Surgical History:  Procedure Laterality Date   CHOLECYSTECTOMY  2000   OTHER SURGERY  08/28/2021   CATH   ABLATION ARRYTHMIA FOCUS     TONSILLECTOMY       FAMILY HISTORY: Family History  Problem Relation Name Age of Onset   Diabetes Mother     Heart disease Mother     Lupus Mother     Rheum arthritis Mother     Kidney disease Mother     High blood pressure (Hypertension) Brother         4 brothers   Liver cancer Cousin         2 cousins   Brain cancer Brother     Anesthesia problems Neg Hx       SOCIAL HISTORY: Social History   Socioeconomic History   Marital status: Single  Tobacco Use   Smoking status: Every Day    Current packs/day: 0.50    Average packs/day: 0.5 packs/day for 40.0 years (20.0 ttl pk-yrs)    Types: Cigarettes   Smokeless tobacco: Never  Vaping Use   Vaping status: Never Used  Substance and Sexual Activity   Alcohol use: Yes    Alcohol/week: 28.0 - 35.0 standard drinks of alcohol    Types: 28 - 35 Cans of beer  per week    Comment: 4-5 beers daily   Drug use: No   Sexual activity: Defer    PHYSICAL EXAM: Vitals:   01/28/23 1103  BP: (!) 178/98  Pulse: 74   Body mass index is 25.25 kg/m. Weight: 79.8 kg (176 lb)   GENERAL:  Alert, active, oriented x3  HEENT: Pupils equal reactive to light. Extraocular movements are intact. Sclera clear. Palpebral conjunctiva normal red color.Pharynx clear.  NECK: Supple with no palpable mass and no adenopathy.  LUNGS: Sound clear with no rales rhonchi or wheezes.  HEART: Regular rhythm S1 and S2 without murmur.  ABDOMEN: Soft and depressible, nontender with no palpable mass, no hepatomegaly.  Palpable hernia on right groin.  Weakness on the left groin.  EXTREMITIES: Well-developed well-nourished symmetrical with no dependent edema.  NEUROLOGICAL: Awake alert oriented, facial expression symmetrical, moving all extremities.  REVIEW OF DATA: I have reviewed the following data today: Office Visit on 11/01/2022  Component Date Value   Glucose 11/01/2022 99    Sodium 11/01/2022 136    Potassium 11/01/2022 4.0    Chloride 11/01/2022 96 (L)    Carbon Dioxide (CO2) 11/01/2022 26.1    Calcium 11/01/2022 9.8    Urea Nitrogen (BUN) 11/01/2022 6 (L)    Creatinine 11/01/2022 0.6 (L)    Glomerular Filtration Ra* 11/01/2022 109    BUN/Crea Ratio 11/01/2022 10.0    Anion Gap w/K 11/01/2022 17.9 (H)    Vitamin B12 11/01/2022 573    Vitamin D, 25-Hydroxy - * 11/01/2022 60.5      ASSESSMENT: Mr. Bodey is a 62 y.o. male presenting for consultation for right versus bilateral inguinal hernia.    The patient presents with a symptomatic, reducible right versus bilateral inguinal hernia. Patient was oriented about the diagnosis of inguinal hernia and its implication. The patient was oriented about the treatment alternatives (observation vs surgical repair). Due to patient symptoms, repair is recommended. Patient oriented about the surgical procedure, the use of mesh and its risk of complications such as: infection, bleeding, injury to vas deference, vasculature and testicle, injury to bowel or bladder, and chronic pain.   Also discussed about higher risk of complications and recurrence  due to chronic smoking.  Patient is being followed by pulmonology and he has not been able to stop smoking.  He understand the high risk but still not agreed to proceed due to recent family passing away due to history of strangulated hernia.  Non-recurrent unilateral inguinal hernia without obstruction or gangrene [K40.90]  PLAN: 1.  Robotic assisted laparoscopic right versus bilateral inguinal hernia repair with mesh (88416) 2.  We highly encourage to stop smoking 3.  Will need to hold Plavix 7 days before procedure 4.  Cardiology clearance 5.  Contact us if has any question or concern.   Patient verbalized understanding, all questions were answered, and were agreeable with the plan outlined above.    Carolan Shiver, MD  Electronically signed by Carolan Shiver, MD

## 2023-01-28 NOTE — H&P (Signed)
PATIENT PROFILE: Scott [name] is a 62 y.o. male who presents to the Clinic for consultation at the request of Scott Lipa, NP for evaluation of inguinal hernia.  PCP:  Scott Collin, NP  HISTORY OF PRESENT ILLNESS: Scott [name] reports feeling a lump in the right groin since January 2024.  He endorses that the lump is increasing in size.  Sometimes he feels a burning pain on the right groin.  No pain radiation.  Pain aggravated by certain activities.  Patient denies any episode of abdominal distention, nausea or vomiting.  Patient had a PET scan to follow-up with pulmonary nodules and there was identified possible bilateral inguinal hernia, bigger on the right side.  I personally evaluated the images   PROBLEM LIST: Problem List  Date Reviewed: 09/20/2022          Noted   Right inguinal hernia 01/16/2023   Overview    Contains small bowel See PET scan 6/24      Left inguinal hernia 01/16/2023   Overview    Containing fat See PET Scan 6/24      Lymphedema 11/26/2022   Overview    Last Assessment & Plan:   Formatting of this note might be different from the original.  Left leg swelling.  Compression, elevation, and activity are important to keep the swelling under control.      Chronic pain in left foot 06/14/2022   Suspected cerebrovascular accident 05/03/2022   Peripheral edema 03/19/2021   H/O deep venous thrombosis 01/09/2021   Overview    Last Assessment & Plan:  Formatting of this note might be different from the original. This may very well been present prior to his revascularization due to the poor flow as he had marked swelling at that time 2.  He is on anticoagulation now which is more than therapy for his DVT.      AAA (abdominal aortic aneurysm) without rupture (CMS-HCC) 11/28/2020   Overview    Last Assessment & Plan:  Formatting of this note might be different from the original. Although I measured out in transverse that over 4 cm, it still less than 5 cm and does  not need repaired at this time. Last Assessment & Plan:   Formatting of this note might be different from the original.  I then dependently reviewed his CT angiogram.  His maximal aortic diameter measured approximately 4.2 cm.  At this point, he does not meet threshold size for prophylactic repair but we will want to follow this on 64-month intervals going forward.  I have discussed that this is unlikely to be a cause of any symptoms or problems at this point, but we will continue to monitor this.  I have discussed the primary risk factors for growth would be his continued tobacco use as well as his poorly controlled hypertension.  Return in 6 months with duplex.      Carotid stenosis 11/28/2020   Overview    Last Assessment & Plan:  Formatting of this note might be different from the original. Duplex today shows mild carotid disease in the 1 to 39% range bilaterally.  Mild disease on appropriate medical therapy.  This can be checked in 2 years.      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 Rest pain is improved after revascularization, but he has significant reperfusion swelling and now has  a tibial DVT which was likely present before his revascularization as he already had marked swelling and profound ischemia for a couple weeks.  He will return in a couple weeks with ABIs.  Last Assessment & Plan:  Formatting of this note might be different from the original. ABIs today are normal at 1.09 on the right and 1.00 on the left with triphasic waveforms and normal digital pressures consistent with no arterial insufficiency.  His perfusion is now excellent.  He still has a lot of neuropathic pain but he was profoundly ischemic for an extended time prior to his revascularization and presentation.  We will plan on seeing him back in about 3 months with noninvasive  studies.  Continue to try to ambulate and exercise is much as possible.  Continue current medical regimen. Last Assessment & Plan:   Formatting of this note might be different from the original.  ABIs are stable at last check with good perfusion.  Had extensive left lower extremity revascularization about a year ago.  Recheck in 6 months.  >>OVERVIEW FOR ATHEROSCLEROSIS OF NATIVE ARTERIES OF EXTREMITIES WITH REST PAIN, UNSPECIFIED EXTREMITY (CMS-HCC) WRITTEN ON 05/03/2022  2:05 PM BY COWARD, SHANNON HIATT, NP  Last Assessment & Plan:  Formatting of this note might be different from the original. I have independently reviewed his CT scan.  He has what appears to be a new left common femoral artery occlusion as well as occlusion of his left SFA interventions.  His right common femoral artery is highly stenotic as is the origin of the SFA.  His symptoms have begun new on the left leg and he clearly has ischemic rest pain requiring dangling of his foot and this is a dramatic change from only about a month ago in the office.  This is a critical and limb threatening situation.  We have discussed the grave nature of the situation we will get him on the schedule this week for bilateral femoral endarterectomies and left SFA and tibial intervention.  We have discussed that his aneurysm is still less than 5 cm and does not need to be repaired at this time which would add a layer of complexity to his already difficult situation.  I have discussed the pathophysiology and natural history of peripheral arterial disease and I have discussed the reason and rationale for treatment as soon as possible.  He voices understanding and is agreeable to proceed.  >>OVERVIEW FOR ATHEROSCLEROSIS OF NATIVE ARTERIES OF EXTREMITY WITH INTERMITTENT CLAUDICATION (CMS-HCC) WRITTEN ON 01/12/2021  2:01 PM BY LEACH, SHANNON HIATT, NP  Last Assessment & Plan:  Formatting of this note might be different from the original. an arterial duplex  at the hospital which I have reviewed which showed a left SFA occlusion and suggested potential inflow disease on the left side as well.  No significant disease was noted on the duplex in the right leg. Recommend:  The patient has experienced increased symptoms and is now describing lifestyle limiting claudication and mild rest pain.  Given the severity of the patient's lower extremity symptoms the patient should undergo angiography and intervention.  Risk and benefits were reviewed the patient.  Indications for the procedure were reviewed.  All questions were answered, the patient agrees to proceed.   The patient should continue walking and begin a more formal exercise program.  The patient should continue antiplatelet therapy and aggressive treatment of the lipid abnormalities  The patient will follow up with me after the angiogram.  Last Assessment & Plan:  Formatting of this note might be different from the original. ABIs today are 0.75 on the right which is a drop from 0.9 at his last visit.  Left ABI is 1.15 with triphasic waveforms and normal digital pressures.  Does still have claudication symptoms on the right and has had a drop in his ABI, but his symptoms are reasonably stable.  We discussed if these were lifestyle limiting, intervention would certainly be appropriate.  He does not think they are lifestyle limiting at this time so we will continue to monitor this and I will plan to see him back in 6 months.  Continue current medical regimen.      Vitamin B12 deficiency 09/02/2018   Vitamin D deficiency 09/02/2018   Overview    Severe- Vitamin D 9.2 (09/01/2018)      Chest congestion 05/27/2018   High risk medication use 03/03/2018   Overview    lovastatin      Arthritis involving multiple sites 03/28/2017   Overview    Bilateral hands, family h/o RA - offered rheumatology lab work up, patient declined      Intermittent chest pain 03/28/2017   Status post ablation of ventricular  arrhythmia 03/28/2017   Family history of rheumatoid arthritis 03/28/2017   Dupuytren's contracture of both hands 03/28/2017   Daily consumption of alcohol 03/28/2017   SVT (supraventricular tachycardia) (CMS/HHS-HCC) 12/20/2015   Overview    S/p ablation at [facility] 02/28/16      Cigarette smoker 06/19/2015   Overview    Formatting of this note might be different from the original. Last Assessment & Plan:  Formatting of this note might be different from the original. Still smoking. Has not cut back any  Last Assessment & Plan:  Formatting of this note might be different from the original. We discussed this is a primary atherosclerotic risk factor and smoking cessation would reduce his risk of atherosclerotic disease.  It also affects the patency of any intervention.  Smoking cessation strongly recommended.      Hypercholesterolemia 06/19/2015   Overview    Formatting of this note might be different from the original. Last Assessment & Plan:  Formatting of this note might be different from the original. Doing well. Pt is taking medications as instructed. No complaints of myalgias.  Last Assessment & Plan:  Formatting of this note might be different from the original. lipid control important in reducing the progression of atherosclerotic disease. Continue statin therapy      Hypertension 12/03/2013   CAD (coronary artery disease) 12/03/2013   Anxiety 12/03/2013   Allergic rhinitis 12/03/2013   COPD (chronic obstructive pulmonary disease) (CMS/HHS-HCC) 12/03/2013   Overview    Declined treatment 03/2017 and Pulmonology referral       GENERAL REVIEW OF SYSTEMS:   General ROS: negative for - chills, fatigue, fever, weight gain or weight loss Allergy and Immunology ROS: negative for - hives  Hematological and Lymphatic ROS: negative for - bleeding problems or bruising, negative for palpable nodes Endocrine ROS: negative for - heat or cold intolerance, hair changes Respiratory ROS:  negative for - shortness of breath or wheezing.  Positive for coughing Cardiovascular ROS: no chest pain or palpitations GI ROS: negative for nausea, vomiting, abdominal pain, diarrhea, constipation Musculoskeletal ROS: negative for - joint swelling or muscle pain Neurological ROS: negative for - confusion, syncope Dermatological ROS: negative for pruritus and rash Psychiatric: negative for anxiety, depression, difficulty sleeping and memory loss  MEDICATIONS: Current Outpatient Medications  Medication Sig  Dispense Refill   albuterol MDI, PROVENTIL, VENTOLIN, PROAIR, HFA 90 mcg/actuation inhaler Inhale 2 inhalations into the lungs every 6 (six) hours as needed 18 each 3   budesonide-formoteroL (SYMBICORT) 160-4.5 mcg/actuation inhaler Inhale 2 inhalations into the lungs 2 (two) times daily 30.6 g 3   cholecalciferol (VITAMIN D3) 5,000 unit capsule Take 1 capsule (5,000 Units total) by mouth once daily for Vitamin D Deficiency. 360 capsule 11   cloNIDine HCL (CATAPRES) 0.1 MG tablet Take 1 tablet (0.1 mg total) by mouth 2 (two) times daily 60 tablet 11   clopidogreL (PLAVIX) 75 mg tablet take 1 tablet by mouth every day 90 tablet 3   cyanocobalamin (VITAMIN B12) 1000 MCG tablet Take 2 tablets daily for 2 weeks, then reduce to 1 tablet daily thereafter for Vitamin B12 Deficiency. 360 tablet 11   FUROsemide (LASIX) 20 MG tablet Take 1 tablet (20 mg total) by mouth once daily 90 tablet 3   ipratropium-albuteroL (DUO-NEB) nebulizer solution Take 3 mLs by nebulization 4 (four) times daily for 360 days 360 mL 4   lisinopriL-hydroCHLOROthiazide (ZESTORETIC) 20-12.5 mg tablet Take 2 tablets by mouth 2 (two) times daily 240 tablet 5   metoprolol succinate (TOPROL-XL) 100 MG XL tablet TAKE 1 TABLET BY MOUTH EVERY DAY 90 tablet 1   atorvastatin (LIPITOR) 40 MG tablet Take 1 tablet (40 mg total) by mouth once daily (Patient not taking: Reported on 01/24/2023) 90 tablet 3   isosorbide mononitrate (IMDUR) 60  MG ER tablet Take 1 tablet (60 mg total) by mouth once daily (Patient not taking: Reported on 01/24/2023) 90 tablet 3   No current facility-administered medications for this visit.    ALLERGIES: Tylenol-codeine solution, Acetaminophen-codeine, Chantix [varenicline], Gabapentin, and Lyrica [pregabalin]  PAST MEDICAL HISTORY: Past Medical History:  Diagnosis Date   Allergic rhinitis    Allergic state    Anxiety    Arrhythmia    Arthritis    CAD (coronary artery disease)    COPD (chronic obstructive pulmonary disease) (CMS/HHS-HCC)    COPD (chronic obstructive pulmonary disease) (CMS/HHS-HCC) 12/03/2013   Hyperlipidemia    Hypertension    Rib pain on right side 05/27/2018    PAST SURGICAL HISTORY: Past Surgical History:  Procedure Laterality Date   CHOLECYSTECTOMY  2000   OTHER SURGERY  08/28/2021   CATH   ABLATION ARRYTHMIA FOCUS     TONSILLECTOMY       FAMILY HISTORY: Family History  Problem Relation Name Age of Onset   Diabetes Mother     Heart disease Mother     Lupus Mother     Rheum arthritis Mother     Kidney disease Mother     High blood pressure (Hypertension) Brother         4 brothers   Liver cancer Cousin         2 cousins   Brain cancer Brother     Anesthesia problems Neg Hx       SOCIAL HISTORY: Social History   Socioeconomic History   Marital status: Single  Tobacco Use   Smoking status: Every Day    Current packs/day: 0.50    Average packs/day: 0.5 packs/day for 40.0 years (20.0 ttl pk-yrs)    Types: Cigarettes   Smokeless tobacco: Never  Vaping Use   Vaping status: Never Used  Substance and Sexual Activity   Alcohol use: Yes    Alcohol/week: 28.0 - 35.0 standard drinks of alcohol    Types: 28 - 35 Cans of beer  per week    Comment: 4-5 beers daily   Drug use: No   Sexual activity: Defer    PHYSICAL EXAM: Vitals:   01/28/23 1103  BP: (!) 178/98  Pulse: 74   Body mass index is 25.25 kg/m. Weight: 79.8 kg (176 lb)   GENERAL:  Alert, active, oriented x3  HEENT: Pupils equal reactive to light. Extraocular movements are intact. Sclera clear. Palpebral conjunctiva normal red color.Pharynx clear.  NECK: Supple with no palpable mass and no adenopathy.  LUNGS: Sound clear with no rales rhonchi or wheezes.  HEART: Regular rhythm S1 and S2 without murmur.  ABDOMEN: Soft and depressible, nontender with no palpable mass, no hepatomegaly.  Palpable hernia on right groin.  Weakness on the left groin.  EXTREMITIES: Well-developed well-nourished symmetrical with no dependent edema.  NEUROLOGICAL: Awake alert oriented, facial expression symmetrical, moving all extremities.  REVIEW OF DATA: I have reviewed the following data today: Office Visit on 11/01/2022  Component Date Value   Glucose 11/01/2022 99    Sodium 11/01/2022 136    Potassium 11/01/2022 4.0    Chloride 11/01/2022 96 (L)    Carbon Dioxide (CO2) 11/01/2022 26.1    Calcium 11/01/2022 9.8    Urea Nitrogen (BUN) 11/01/2022 6 (L)    Creatinine 11/01/2022 0.6 (L)    Glomerular Filtration Ra* 11/01/2022 109    BUN/Crea Ratio 11/01/2022 10.0    Anion Gap w/K 11/01/2022 17.9 (H)    Vitamin B12 11/01/2022 573    Vitamin D, 25-Hydroxy - * 11/01/2022 60.5      ASSESSMENT: Mr. Bodey is a 62 y.o. male presenting for consultation for right versus bilateral inguinal hernia.    The patient presents with a symptomatic, reducible right versus bilateral inguinal hernia. Patient was oriented about the diagnosis of inguinal hernia and its implication. The patient was oriented about the treatment alternatives (observation vs surgical repair). Due to patient symptoms, repair is recommended. Patient oriented about the surgical procedure, the use of mesh and its risk of complications such as: infection, bleeding, injury to vas deference, vasculature and testicle, injury to bowel or bladder, and chronic pain.   Also discussed about higher risk of complications and recurrence  due to chronic smoking.  Patient is being followed by pulmonology and he has not been able to stop smoking.  He understand the high risk but still not agreed to proceed due to recent family passing away due to history of strangulated hernia.  Non-recurrent unilateral inguinal hernia without obstruction or gangrene [K40.90]  PLAN: 1.  Robotic assisted laparoscopic right versus bilateral inguinal hernia repair with mesh (88416) 2.  We highly encourage to stop smoking 3.  Will need to hold Plavix 7 days before procedure 4.  Cardiology clearance 5.  Contact us if has any question or concern.   Patient verbalized understanding, all questions were answered, and were agreeable with the plan outlined above.    Carolan Shiver, MD  Electronically signed by Carolan Shiver, MD

## 2023-02-04 ENCOUNTER — Other Ambulatory Visit: Payer: Self-pay

## 2023-02-04 ENCOUNTER — Encounter
Admission: RE | Admit: 2023-02-04 | Discharge: 2023-02-04 | Disposition: A | Discharge status: Home / Self Care | Payer: 59 | Source: Ambulatory Visit | Attending: General Surgery | Admitting: General Surgery

## 2023-02-04 VITALS — Ht 71.0 in | Wt 175.0 lb

## 2023-02-04 DIAGNOSIS — Z8679 Personal history of other diseases of the circulatory system: Secondary | ICD-10-CM

## 2023-02-04 DIAGNOSIS — R079 Chest pain, unspecified: Secondary | ICD-10-CM

## 2023-02-04 DIAGNOSIS — Z79899 Other long term (current) drug therapy: Secondary | ICD-10-CM

## 2023-02-04 DIAGNOSIS — I471 Supraventricular tachycardia, unspecified: Secondary | ICD-10-CM

## 2023-02-04 DIAGNOSIS — I10 Essential (primary) hypertension: Secondary | ICD-10-CM

## 2023-02-04 HISTORY — DX: Acute embolism and thrombosis of unspecified deep veins of unspecified lower extremity: I82.409

## 2023-02-04 HISTORY — DX: Gastro-esophageal reflux disease without esophagitis: K21.9

## 2023-02-04 HISTORY — DX: Supraventricular tachycardia, unspecified: I47.10

## 2023-02-04 HISTORY — DX: Occlusion and stenosis of unspecified carotid artery: I65.29

## 2023-02-04 HISTORY — DX: Abdominal aortic aneurysm, without rupture, unspecified: I71.40

## 2023-02-04 HISTORY — DX: Angina pectoris, unspecified: I20.9

## 2023-02-04 HISTORY — DX: Disorder of arteries and arterioles, unspecified: I77.9

## 2023-02-04 NOTE — Patient Instructions (Addendum)
Your procedure is scheduled on: Wednesday 02/12/23 To find out your arrival time, please call [phone] between 1PM - 3PM on:   Tuesday 02/11/23 Report to the Registration Desk on the 1st floor of the [facility]. Free Valet parking is available.  If your arrival time is 6:00 am, do not arrive before that time as the [facility] entrance doors do not open until 6:00 am.  REMEMBER: Instructions that are not followed completely may result in serious medical risk, up to and including death; or upon the discretion of your surgeon and anesthesiologist your surgery may need to be rescheduled.  Do not eat food after midnight the night before surgery.  No gum chewing or hard candies.  You may however, drink CLEAR liquids up to 3 hours before you are scheduled to arrive for your surgery. Do not drink anything within 3 hours of your scheduled arrival time.  Clear liquids include: - water  - apple juice without pulp - gatorade (not RED colors) - black coffee or tea (Do NOT add milk or creamers to the coffee or tea) Do NOT drink anything that is not on this list.  One week prior to surgery: Stop Anti-inflammatories (NSAIDS) such as Advil, Aleve, Ibuprofen, Motrin, Naproxen, Naprosyn and Aspirin based products such as Excedrin, Goody's Powder, BC Powder. You may however, continue to take Tylenol if needed for pain up until the day of surgery.  Stop ANY OVER THE COUNTER supplements until after surgery.  Continue taking all prescribed medications with the exception of the following: Plavix, last dose until after surgery is today 02/04/23  TAKE ONLY THESE MEDICATIONS THE MORNING OF SURGERY WITH A SIP OF WATER:  atorvastatin (LIPITOR) 40 MG tablet  cloNIDine (CATAPRES) 0.1 MG tablet  famotidine (PEPCID) 20 MG tablet Antacid (take one the night before and one on the morning of surgery - helps to prevent nausea after surgery.) metoprolol succinate (TOPROL-XL) 100 MG 24 hr tablet   Use  inhalers on the day of surgery and bring to the hospital.  No Alcohol for 24 hours before or after surgery.  No Smoking including e-cigarettes for 24 hours before surgery.  No chewable tobacco products for at least 6 hours before surgery.  No nicotine patches on the day of surgery.  Do not use any "recreational" drugs for at least a week (preferably 2 weeks) before your surgery.  Please be advised that the combination of cocaine and anesthesia may have negative outcomes, up to and including death. If you test positive for cocaine, your surgery will be cancelled.  On the morning of surgery brush your teeth with toothpaste and water, you may rinse your mouth with mouthwash if you wish. Do not swallow any toothpaste or mouthwash.  Use CHG Soap or wipes as directed on instruction sheet.  Do not wear lotions, powders, or perfumes.   Do not shave body hair from the neck down 48 hours before surgery.  Wear comfortable clothing (specific to your surgery type) to the hospital.  Do not wear jewelry, make-up, hairpins, clips or nail polish.  For welded (permanent) jewelry: bracelets, anklets, waist bands, etc.  Please have this removed prior to surgery.  If it is not removed, there is a chance that hospital personnel will need to cut it off on the day of surgery. Contact lenses, hearing aids and dentures may not be worn into surgery.  Do not bring valuables to the hospital. [facility] is not responsible for any missing/lost belongings or valuables.  Notify your doctor if there is any change in your medical condition (cold, fever, infection).  If you are being discharged the day of surgery, you will not be allowed to drive home. You will need a responsible individual to drive you home and stay with you for 24 hours after surgery.   If you are taking public transportation, you will need to have a responsible individual with you.  If you are being admitted to the hospital overnight, leave  your suitcase in the car. After surgery it may be brought to your room.  In case of increased patient census, it may be necessary for you, the patient, to continue your postoperative care in the Same Day Surgery department.  After surgery, you can help prevent lung complications by doing breathing exercises.  Take deep breaths and cough every 1-2 hours. Your doctor may order a device called an Incentive Spirometer to help you take deep breaths. When coughing or sneezing, hold a pillow firmly against your incision with both hands. This is called "splinting." Doing this helps protect your incision. It also decreases belly discomfort.  Surgery Visitation Policy:  Patients undergoing a surgery or procedure may have two family members or support persons with them as long as the person is not COVID-19 positive or experiencing its symptoms.   Inpatient Visitation:    Visiting hours are 7 a.m. to 8 p.m. Up to four visitors are allowed at one time in a patient room. The visitors may rotate out with other people during the day. One designated support person (adult) may remain overnight.  Please call the Pre-admissions Testing Dept. at [phone] if you have any questions about these instructions.     Preparing for Surgery with CHLORHEXIDINE GLUCONATE (CHG) Soap  Chlorhexidine Gluconate (CHG) Soap  o An antiseptic cleaner that kills germs and bonds with the skin to continue killing germs even after washing  o Used for showering the night before surgery and morning of surgery  Before surgery, you can play an important role by reducing the number of germs on your skin.  CHG (Chlorhexidine gluconate) soap is an antiseptic cleanser which kills germs and bonds with the skin to continue killing germs even after washing.  Please do not use if you have an allergy to CHG or antibacterial soaps. If your skin becomes reddened/irritated stop using the CHG.  1. Shower the NIGHT BEFORE SURGERY and the  MORNING OF SURGERY with CHG soap.  2. If you choose to wash your hair, wash your hair first as usual with your normal shampoo.  3. After shampooing, rinse your hair and body thoroughly to remove the shampoo.  4. Use CHG as you would any other liquid soap. You can apply CHG directly to the skin and wash gently with a scrungie or a clean washcloth.  5. Apply the CHG soap to your body only from the neck down. Do not use on open wounds or open sores. Avoid contact with your eyes, ears, mouth, and genitals (private parts). Wash face and genitals (private parts) with your normal soap.  6. Wash thoroughly, paying special attention to the area where your surgery will be performed.  7. Thoroughly rinse your body with warm water.  8. Do not shower/wash with your normal soap after using and rinsing off the CHG soap.  9. Pat yourself dry with a clean towel.  10. Wear clean pajamas to bed the night before surgery.  12. Place clean sheets on your bed the night of your  first shower and do not sleep with pets.  13. Shower again with the CHG soap on the day of surgery prior to arriving at the hospital.  14. Do not apply any deodorants/lotions/powders.  15. Please wear clean clothes to the hospital.

## 2023-02-05 ENCOUNTER — Encounter: Payer: Self-pay | Admitting: Urgent Care

## 2023-02-05 ENCOUNTER — Inpatient Hospital Stay
Admission: RE | Admit: 2023-02-05 | Discharge: 2023-02-05 | Discharge status: Home / Self Care | Payer: 59 | Source: Ambulatory Visit | Attending: General Surgery

## 2023-02-05 ENCOUNTER — Telehealth: Payer: Self-pay | Admitting: Urgent Care

## 2023-02-05 DIAGNOSIS — Z9889 Other specified postprocedural states: Secondary | ICD-10-CM | POA: Diagnosis not present

## 2023-02-05 DIAGNOSIS — X58XXXA Exposure to other specified factors, initial encounter: Secondary | ICD-10-CM | POA: Diagnosis not present

## 2023-02-05 DIAGNOSIS — I10 Essential (primary) hypertension: Secondary | ICD-10-CM | POA: Diagnosis not present

## 2023-02-05 DIAGNOSIS — E876 Hypokalemia: Secondary | ICD-10-CM | POA: Diagnosis not present

## 2023-02-05 DIAGNOSIS — I471 Supraventricular tachycardia, unspecified: Secondary | ICD-10-CM | POA: Diagnosis not present

## 2023-02-05 DIAGNOSIS — Z79899 Other long term (current) drug therapy: Secondary | ICD-10-CM

## 2023-02-05 DIAGNOSIS — Z8679 Personal history of other diseases of the circulatory system: Secondary | ICD-10-CM | POA: Insufficient documentation

## 2023-02-05 DIAGNOSIS — E871 Hypo-osmolality and hyponatremia: Secondary | ICD-10-CM

## 2023-02-05 DIAGNOSIS — T502X5A Adverse effect of carbonic-anhydrase inhibitors, benzothiadiazides and other diuretics, initial encounter: Secondary | ICD-10-CM | POA: Insufficient documentation

## 2023-02-05 DIAGNOSIS — R079 Chest pain, unspecified: Secondary | ICD-10-CM | POA: Insufficient documentation

## 2023-02-05 DIAGNOSIS — Z01818 Encounter for other preprocedural examination: Secondary | ICD-10-CM | POA: Insufficient documentation

## 2023-02-05 DIAGNOSIS — Z01812 Encounter for preprocedural laboratory examination: Secondary | ICD-10-CM

## 2023-02-05 LAB — CBC
HCT: 43.5 % (ref 39.0–52.0)
Hemoglobin: 16.3 g/dL (ref 13.0–17.0)
MCH: 34.7 pg — ABNORMAL HIGH (ref 26.0–34.0)
MCHC: 37.5 g/dL — ABNORMAL HIGH (ref 30.0–36.0)
MCV: 92.6 fL (ref 80.0–100.0)
Platelets: 188 10*3/uL (ref 150–400)
RBC: 4.7 MIL/uL (ref 4.22–5.81)
RDW: 11.9 % (ref 11.5–15.5)
WBC: 4.9 10*3/uL (ref 4.0–10.5)
nRBC: 0 % (ref 0.0–0.2)

## 2023-02-05 LAB — BASIC METABOLIC PANEL
Anion gap: 14 (ref 5–15)
BUN: <5 mg/dL — ABNORMAL LOW (ref 8–23)
CO2: 31 mmol/L (ref 22–32)
Calcium: 9.1 mg/dL (ref 8.9–10.3)
Chloride: 82 mmol/L — ABNORMAL LOW (ref 98–111)
Creatinine, Ser: 0.74 mg/dL (ref 0.61–1.24)
GFR, Estimated: >60 mL/min (ref >60)
Glucose, Bld: 114 mg/dL — ABNORMAL HIGH (ref 70–99)
Potassium: 2.8 mmol/L — ABNORMAL LOW (ref 3.5–5.1)
Sodium: 127 mmol/L — ABNORMAL LOW (ref 135–145)

## 2023-02-05 LAB — MAGNESIUM: Magnesium: 2.3 mg/dL (ref 1.7–2.4)

## 2023-02-05 MED ORDER — POTASSIUM CHLORIDE CRYS ER 20 MEQ PO TBCR
EXTENDED_RELEASE_TABLET | ORAL | 0 refills | Status: DC
Start: 2023-02-05 — End: 2024-01-01

## 2023-02-05 NOTE — Progress Notes (Signed)
[facility] Perioperative Services: Pre-Admission/Anesthesia Testing  Abnormal Lab Notification and Treatment Plan of Care   Date: 02/05/23  Name: Scott [name] MRN:   [id number]  Re: Abnormal labs noted during PAT appointment   Notified:  Provider Name Provider Role Notification Mode  Carolan Shiver, MD General Surgery (Surgeon) Routed and/or faxed via Samuella Cota, NP Primary Care Provider Routed and/or faxed via [facility]   Clinical Information and Notes:  ABNORMAL LAB VALUE(S): Lab Results  Component Value Date   NA 127 (L) 02/05/2023   K 2.8 (L) 02/05/2023   Scott [name] is scheduled for an elective XI ROBOTIC ASSISTED BILATERAL INGUINAL HERNIA on 02/12/2023. In review of his medication reconciliation, it is noted that the patient is taking prescribed diuretic medications (furosemide 20 mg daily, HCTZ 12.5 mg BID) daily.   Please note, in efforts to promote a safe and effective anesthetic course, per current guidelines/standards set by the [facility] anesthesia team, the minimal acceptable K+ level for the patient to proceed with general anesthesia is 3.0 mmol/L. With that being said, if the patient is already too low to proceed with surgery until K+ is better optimized. In efforts to prevent case cancellation, will make efforts to optimize pre-surgical K+ level so that patient can safely undergo the planned surgical intervention.   Impression and Plan:  Scott [name] found to be HYPOkalemic at 2.8 mmol/L and HYPOnatremic at 127 mmol/L on preoperative labs. In review of recent lab studies, the low sodium level is chronic and at patient's baseline; ranges 124-130 mmol/L.  Renal function normal; Estimated Creatinine Clearance: 102 mL/min (by C-G formula based on SCr of 0.74 mg/dL).   Mg level was added and found to be normal at: 2.3 mg/dL  Called patient to discuss results and plans for correction of noted electrolyte derangements. No answer and VM  was not set up. Attempted to contact emergency contact (sister - Scott [name]) who is listed at the same address as the patient; LDMOM notifying them of results.   He is on both loop and thiazide diuretic therapy. Patient is not currently taking any  type of K+ supplement. Discussed diuretic therapy as likely etiology of both his low Na++ and K+ levels, especially in the setting of normal Mg level and in the absence of GI related symptoms (no diarrhea). Reviewed other potential causes, including decreased intake of dietary Na++ and K+ and warmer weather resulting in increased insensible losses. Reviewed plans for preoperative optimization as follows:   Meds ordered this encounter  Medications   potassium chloride SA (KLOR-CON M) 20 MEQ tablet    Sig: Take 2 tablets (40 mEq) today, then take 1 tablet (20 mEq) daily until surgery. Be sure to take dose on day of procedure. Follow up with PCP for repeat labs.    Dispense:  9 tablet    Refill:  0    Please contact patient once Rx is filled and ready. This is for preoperative optimization and needs to be started ASAP.   Encouraged patient to follow up with PCP about 2-3 weeks postoperatively to have labs rechecked to ensure that levels are remaining within normal range. Discussed nutritional intake of K+ rich foods as an adjunctive way to keep his K+ levels normal; list of K+ rich foods provided. Also mentioned ORS, however advised him not to rely solely on these drinks, as they are high in Na+, and he has a HTN diagnosis.   Will send copy of  this note to surgeon and PCP to make them aware of K+ level and plans for correction. Discussed that PCP may elect to pursue a change in diuretic therapy to a K+ sparing type medication, or alternatively, they may consider adding a daily K+ supplement if levels remain low on recheck. Order entered to recheck K+ on the day of his surgery to ensure optimization. Wished patient the best of luck with his upcoming surgery  and subsequent recovery. He was encouraged to return call to the PAT clinic, or to his surgeon's office, should any questions or concerns arise between now and the time of his surgery.  Encounter Diagnoses  Name Primary?   Pre-operative laboratory examination Yes   Long term current use of diuretic    Diuretic-induced hypokalemia    Hyponatremia    Scott Mulling, MSN, APRN, FNP-C, CEN [facility]  Perioperative Services Nurse Practitioner Phone: [phone] 02/05/23 3:05 PM  NOTE: This note has been prepared using Dragon dictation software. Despite my best ability to proofread, there is always the potential that unintentional transcriptional errors may still occur from this process.

## 2023-02-07 ENCOUNTER — Encounter: Payer: Self-pay | Admitting: General Surgery

## 2023-02-07 NOTE — Progress Notes (Incomplete)
Perioperative / Anesthesia Services  Pre-Admission Testing Clinical Review / Pre-Operative Anesthesia Consult  Date: 02/11/23  Patient Demographics:  Name: Scott [name] DOB:   October 10, 1960 MRN:   [id number]  Planned Surgical Procedure(s):    Case: [id number] Date/Time: 02/12/23 1129   Procedure: XI ROBOTIC ASSISTED BILATERAL INGUINAL HERNIA (Bilateral: Inguinal)   Anesthesia type: General   Pre-op diagnosis: K40.90 non recurrent unilateral inguinal hernia w/o obstruction or gangrene   Location: [facility] OR ROOM 04 / [facility] ORS FOR ANESTHESIA GROUP   Surgeons: Carolan Shiver, MD     NOTE: Available PAT nursing documentation and vital signs have been reviewed. Clinical nursing staff has updated patient's PMH/PSHx, current medication list, and drug allergies/intolerances to ensure comprehensive history available to assist in medical decision making as it pertains to the aforementioned surgical procedure and anticipated anesthetic course. Extensive review of available clinical information personally performed. [place] PMH and PSHx updated with any diagnoses/procedures that  may have been inadvertently omitted during his intake with the pre-admission testing department's nursing staff.  Clinical Discussion:  Scott [name] is a 62 y.o. male who is submitted for pre-surgical anesthesia review and clearance prior to him undergoing the above procedure. Patient is a Current Smoker. Pertinent PMH includes: CAD, AAA, dilatation of the thoracic aorta, SVT (s/p ablation), DVT, PVD (s/p BILATERAL femoral endarterectomies), BILATERAL carotid artery disease, aortic atherosclerosis, angina, HTN, HLD, prediabetes, COPD, GERD (on daily H2 blocker), BILATERAL inguinal hernia, OA, depression, anxiety.  Patient is followed by cardiology Juliann Pares, MD). He was last seen in the cardiology clinic on 01/24/2023; notes reviewed. At the time of his clinic visit, patient doing well overall from a cardiovascular  perspective.  Patient with chronic exertional dyspnea that was reported to be stable and at baseline.  Patient also with complaints of BILATERAL lower extremity pain associated with known PVD.  Patient with chronic peripheral edema that is partially relieved with elevation and diuretic use.  Patient denied any chest pain, PND, orthopnea, palpitations, weakness, fatigue, vertiginous symptoms, or presyncope/syncope. Patient with a past medical history significant for cardiovascular diagnoses. Documented physical exam was grossly benign, providing no evidence of acute exacerbation and/or decompensation of the patient's known cardiovascular conditions.  Patient underwent diagnostic LEFT heart catheterization on 06/03/2017 revealing minor single-vessel CAD.  There was a 25% lesion noted in the proximal LAD.  Given the nonobstructive nature of his coronary artery disease, the decision was made to defer intervention opting for medical management.  Patient with a history of SVT.  Definitive treatment with ablation procedure was performed on 02/28/2016; no recurrence.  Repeat diagnostic LEFT heart catheterization was performed on 08/22/2021 revealing multivessel CAD; 25% proximal LAD, 15% ostial-mid LM, 25% LM to proximal LAD, 25% ostial-mid LCx, and 25% ostial OM2.  Again, given the nonobstructive nature of his coronary artery disease, intervention was deferred opting for continued medical management.  Patient with a known infrarenal AAA dating back to at least 02/2020, at which time aneurysmal defect measured 3.8 cm.  Since that time, defect has been serially monitored for progression.  PET/CT was performed on 10/01/2022 revealing interval increase in size to 4.3 cm.  Follow-up AAA duplex was performed on 11/26/2022 demonstrating minimal increase in size to 4.4 cm.  Given patient's PVD diagnosis, he is on daily antithrombotic therapy using clopidogrel.  Patient is reportedly compliant with therapy with no  evidence reports of GI/GU related bleeding.  Blood pressure uncontrolled at 152/92 mmHg on currently prescribed alpha blocker (clonidine), diuretic (furosemide), nitrate (isosorbide mononitrate),  ACEi/diuretic (lisinopril-HCTZ), and beta-blocker (metoprolol succinate) on therapies.  Patient is on atorvastatin for his HLD diagnosis and ASCVD prevention.  Patient has a prediabetes diagnosis.  His most recent hemoglobin A1c was 5.9% when checked on 05/03/2022. Patient does not have an OSAH diagnosis.  Functional capacity somewhat limited by lower extremity pain and chronic exertional dyspnea.  With that said, patient is still able to complete all of his ADLs/IADLs independently without significant cardiovascular limitation.  Per the [facility], patient is able to achieve >4 METS of physical activity without experiencing any significant degrees of angina/anginal equivalent symptoms.  No changes were made to his medication regimen.  Patient follow-up with outpatient cardiology in 6 months or sooner if needed.  Scott [name] is scheduled for elective XI ROBOTIC ASSISTED BILATERAL INGUINAL HERNIA repair on 02/12/2023 with Dr. Carolan Shiver, MD.  Given patient's past medical history significant for cardiovascular diagnoses, presurgical cardiac clearance was sought by the PAT team.  Per cardiology, "this patient is optimized for surgery and may proceed with the planned procedural course with a MILD to MODERATE risk of significant perioperative cardiovascular complications".  Again, this patient is on daily oral antithrombotic therapy. He has been instructed on recommendations for holding his clopidogrel for 7 days prior to his procedure with plans to restart as soon as postoperative bleeding risk felt to be minimized by his attending surgeon. The patient has been instructed that his last dose of his clopidogrel should be on 02/04/2023.  Patient denies previous perioperative complications with anesthesia in the past.  In review of the available records, it is noted that patient underwent a general anesthetic course here at [facility] ([facility]) in 12/2020 without documented complications.      02/04/2023   10:19 AM 11/26/2022    8:39 AM 11/05/2022    2:50 PM  Vitals with BMI  Height 5\' 11"  5\' 10"    Weight 175 lbs 179 lbs 13 oz   BMI 24.42 25.8   Systolic  169 187  Diastolic  97 111  Pulse  72 80    Providers/Specialists:   NOTE: Primary physician provider listed below. Patient may have been seen by APP or partner within same practice.   PROVIDER ROLE / SPECIALTY LAST Beverely Pace, MD General Surgery (Surgeon) 01/28/2023  Luciana Axe, NP Primary Care Provider 11/01/2022  Rudean Hitt, MD Cardiology 01/24/2023  Ned Clines, MD Pulmonary Medicine 11/20/2022  Theora Master, MD Neurology 08/19/2022   Allergies:  Acetaminophen-codeine, Chantix [varenicline], and Codeine  Current Home Medications:   No current facility-administered medications for this encounter.    albuterol (VENTOLIN HFA) 108 (90 Base) MCG/ACT inhaler   atorvastatin (LIPITOR) 40 MG tablet   budesonide-formoterol (SYMBICORT) 160-4.5 MCG/ACT inhaler   cholecalciferol (VITAMIN D3) 25 MCG (1000 UNIT) tablet   cloNIDine (CATAPRES) 0.1 MG tablet   clopidogrel (PLAVIX) 75 MG tablet   famotidine (PEPCID) 20 MG tablet   furosemide (LASIX) 20 MG tablet   ipratropium-albuterol (DUONEB) 0.5-2.5 (3) MG/3ML SOLN   isosorbide mononitrate (IMDUR) 30 MG 24 hr tablet   lisinopril-hydrochlorothiazide (ZESTORETIC) 20-12.5 MG tablet   metoprolol succinate (TOPROL-XL) 100 MG 24 hr tablet   potassium chloride SA (KLOR-CON M) 20 MEQ tablet   vitamin B-12 (CYANOCOBALAMIN) 500 MCG tablet   History:   Past Medical History:  Diagnosis Date   AAA (abdominal aortic aneurysm) without rupture (HCC) 02/23/2020   a.) AAA duplex 02/23/2020: 3.8 cm; b.) AAA duplex 11/28/2020: 3.7 cm; c.) CTA  AO + BIFEM 12/18/2020: 3.8 x 3.4 cm; d.) AAA duplex 11/07/2021: 3.7 cm; e.) CTA AO + BIFEM 11/16/2021: 4.2 cm; f.) AAA duplex 05/28/2022: 4.0 cm; g.) AAA duplex 11/26/2022: 4.4 cm   Alcoholism (HCC)    Anginal pain (HCC)    Anxiety    Aortic atherosclerosis (HCC)    Arthritis    Bilateral carotid artery disease (HCC) 06/20/2020   a.) carotid doppler 06/20/2020: 1-39% RICA, 40-59% LICA; b.) carotid doppler 11/28/2020, 11/26/2022: 1-39% BICA   Bilateral inguinal hernia    CAD (coronary artery disease) 06/03/2017   a.) LHC 06/03/2017: 25% pLAD - med mgmt; b.) LHC 08/22/2021: 25% pLAD, 15% o-mLM, 25% dLM-pLAD, 25% o-mLCx, 25% oD2 - med mgmt   COPD (chronic obstructive pulmonary disease) (HCC)    Coronary artery disease    COVID-19 2022   Deep vein thrombosis (DVT) of left lower extremity (HCC)    Depression    Dilatation of thoracic aorta (HCC) 09/17/2022   a.) CT chest 09/17/2022: 4.0 cm; b.) CT chest 01/02/2023: 3.9 cm   Diverticulosis    Dupuytren contracture of both hands    Dyspnea    GERD (gastroesophageal reflux disease)    Hepatic steatosis    HLD (hyperlipidemia)    Hypertension    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   a.) PET CT 10/01/2022: 6 mm with SUV max of 6.4; b.) s.p FNA 11/05/2022 --> pathology negative for malignancy (DDx oncocytosis vs oncocytoma)   On chronic clopidogrel therapy    PAOD (peripheral arterial occlusive disease) (HCC)    a.) s/p BILATERAL endarterectomies 12/29/2020   Pre-diabetes    SVT (supraventricular tachycardia) (HCC)    a.) s/p ablation 02/28/2016   Past Surgical History:  Procedure Laterality Date   ANKLE SURGERY     "bracelet"   CHOLECYSTECTOMY  2000   COLONOSCOPY     ENDARTERECTOMY FEMORAL Bilateral 12/29/2020   Procedure: ENDARTERECTOMY FEMORAL;  Surgeon: Annice Needy, MD;  Location: [facility] ORS;  Service: Vascular;  Laterality: Bilateral;  Left SFA and Tibial intervention   LEFT HEART CATH AND CORONARY ANGIOGRAPHY Left 06/03/2017    Procedure: LEFT HEART CATH AND CORONARY ANGIOGRAPHY;  Surgeon: Alwyn Pea, MD;  Location: [facility] INVASIVE CV LAB;  Service: Cardiovascular;  Laterality: Left;   LEFT HEART CATH AND CORONARY ANGIOGRAPHY N/A 08/22/2021   Procedure: LEFT HEART CATH AND CORONARY ANGIOGRAPHY;  Surgeon: Alwyn Pea, MD;  Location: [facility] INVASIVE CV LAB;  Service: Cardiovascular;  Laterality: N/A;   LOWER EXTREMITY ANGIOGRAPHY Left 02/03/2020   Procedure: LOWER EXTREMITY ANGIOGRAPHY;  Surgeon: Annice Needy, MD;  Location: [facility] INVASIVE CV LAB;  Service: Cardiovascular;  Laterality: Left;   SVT ABLATION N/A 02/28/2016   TONSILLECTOMY     Family History  Problem Relation Age of Onset   Diabetes Mother    Hypertension Mother    Lupus Mother    Heart attack Father    Cancer Brother    Cancer Brother    Social History   Tobacco Use   Smoking status: Every Day    Current packs/day: 1.00    Types: Cigarettes   Smokeless tobacco: Never  Vaping Use   Vaping status: Never Used  Substance Use Topics   Alcohol use: Yes    Comment: 3-4 beers per day   Drug use: No    Pertinent Clinical Results:  LABS:   No visits with results within 3 Day(s) from this visit.  Latest known visit with results is:  [facility]  Outpatient Visit on 02/05/2023  Component Date Value Ref Range Status   Sodium 02/05/2023 127 (L)  135 - 145 mmol/L Final   Potassium 02/05/2023 2.8 (L)  3.5 - 5.1 mmol/L Final   Chloride 02/05/2023 82 (L)  98 - 111 mmol/L Final   CO2 02/05/2023 31  22 - 32 mmol/L Final   Glucose, Bld 02/05/2023 114 (H)  70 - 99 mg/dL Final   Glucose reference range applies only to samples taken after fasting for at least 8 hours.   BUN 02/05/2023 <5 (L)  8 - 23 mg/dL Final   Creatinine, Ser 02/05/2023 0.74  0.61 - 1.24 mg/dL Final   Calcium 16/01/9603 9.1  8.9 - 10.3 mg/dL Final   GFR, Estimated 02/05/2023 >60  >60 mL/min Final   Comment: (NOTE) Calculated using the CKD-EPI Creatinine Equation (2021)     Anion gap 02/05/2023 14  5 - 15 Final   Performed at [facility], [address] [place], [place] [address]   WBC 02/05/2023 4.9  4.0 - 10.5 K/uL Final   RBC 02/05/2023 4.70  4.22 - 5.81 MIL/uL Final   Hemoglobin 02/05/2023 16.3  13.0 - 17.0 g/dL Final   HCT 11/91/4782 43.5  39.0 - 52.0 % Final   MCV 02/05/2023 92.6  80.0 - 100.0 fL Final   MCH 02/05/2023 34.7 (H)  26.0 - 34.0 pg Final   MCHC 02/05/2023 37.5 (H)  30.0 - 36.0 g/dL Final   RDW 95/62/1308 11.9  11.5 - 15.5 % Final   Platelets 02/05/2023 188  150 - 400 K/uL Final   nRBC 02/05/2023 0.0  0.0 - 0.2 % Final   Performed at [facility], [address] [place], [place] [address]   Magnesium 02/05/2023 2.3  1.7 - 2.4 mg/dL Final   Performed at [facility], [address] [place], [place] [address]    ECG: Date: 02/05/2023 Time ECG obtained: 1001 AM Rate: 67 bpm Rhythm: normal sinus; ILBBB Axis (leads I and aVF): Normal Intervals: PR 208 ms. QRS 118 ms. QTc 445 ms. ST segment and T wave changes: No evidence of acute ST segment elevation or depression.   Comparison: Similar to previous tracing obtained on 08/22/2021   IMAGING / PROCEDURES: CT CHEST WO CONTRAST performed on 01/02/2023 Right upper lobe peribronchovascular nodules with associated endobronchial debris, unchanged from 09/09/2022. Recommend return to annual lung cancer screening CT. Age advanced three-vessel coronary artery calcification. Hepatic steatosis. Partially imaged infrarenal aortic aneurysm, better measured on 10/01/2022 and at which time 12 month follow-up and vascular consultation were recommended. Aortic atherosclerosis Emphysema   VAS US CAROTID performed on 11/26/2022 Velocities in the right ICA are consistent with a 1-39% stenosis. Non-hemodynamically significant plaque <50% noted in the CCA.  Velocities in the left ICA are consistent with a 1-39% stenosis. Non-hemodynamically significant plaque <50% noted in the  CCA. The ECA appears >50% stenosed.  Left vertebral artery demonstrates antegrade flow.  Right vertebral artery was not visualized.  Normal flow hemodynamics were seen in bilateral subclavian arteries.   AAA DUPLEX performed on 11/26/2022 There is evidence of abnormal dilatation of the mid abdominal aorta. The largest aortic measurement is 4.4 cm. The largest aortic diameter has increased compared to prior exam. Previous diameter measurement was 4.0 cm obtained on 05/28/2022.   VAS [place] ABI WITH/WO TBI performed on 11/26/2022 Resting right ankle-brachial index indicates moderate right lower extremity arterial disease. The right toe-brachial index is abnormal.  Resting left ankle-brachial index is within  normal range. The left toe-brachial index is normal.   NM PET IMAGE INITIAL (PI) SKULL BASE TO THIGH performed on 10/01/2022 Right upper lobe endobronchial debris and adjacent nodules are too small for PET resolution. Endobronchial debris has been present since 07/10/2021. Adjacent nodules are unchanged from 07/11/2022. Recommend follow-up CT chest without contrast in 3-6 months in further evaluation, as malignancy cannot be excluded. Hypermetabolic 6 mm left parotid nodule. Malignancy cannot be excluded. Steatotic enlarged liver. 4.3 cm infrarenal aortic aneurysm. Recommend follow-up every 12 months and vascular consultation. This recommendation follows ACR consensus guidelines: White Paper of the ACR Incidental Findings Committee II on Vascular Findings. J Am Coll Radiol 2013; 10:789-794. Right inguinal hernia contains small bowel. Aortic atherosclerosis Coronary artery calcification. Emphysema  CT CHEST LCS NODULE F/U LOW DOSE WO CONTRAST performed on 09/17/2022 Lung-RADS 4A, suspicious. Follow up low-dose chest CT without contrast in 3 months (please use the following order, "CT CHEST LCS NODULE FOLLOW-UP W/O CM") is recommended. Persistent occlusive endobronchial density centrally in the  right upper lobe with adjacent persistent ill-defined posterior right upper lobe 9 mm nodule. No substantial change since recent 07/11/2022 screening chest CT. Endobronchial malignancy not excluded. Consider PET-CT for further characterization and/or bronchoscopic evaluation. Dilated 4.0 cm ascending thoracic aorta, for which 12 month CT follow-up is recommended. Three-vessel coronary atherosclerosis. Diffuse hepatic steatosis. Aortic atherosclerosis  Emphysema  LEFT HEART CATHETERIZATION AND CORONARY ANGIOGRAPHY performed on 08/22/2021 Normal LV systolic function with an EF of 55-65% Normal LVEDP Normal overall wall motion Multivessel CAD Prox LAD lesion is 25% stenosed. Ost LM to Mid LM lesion is 15% stenosed. Dist LM to Prox LAD lesion is 25% stenosed. Ost Cx to Mid Cx lesion is 25% stenosed with 25% stenosed side branch in Ost 2nd Mrg. Intervention deferred. Medical management recommended.    Impression and Plan:  Scott [name] has been referred for pre-anesthesia review and clearance prior to him undergoing the planned anesthetic and procedural courses. Available labs, pertinent testing, and imaging results were personally reviewed by me in preparation for upcoming operative/procedural course. [facility] medical record has been updated following extensive record review and patient interview with PAT staff.   This patient has been appropriately cleared by cardiology with an overall MILD to MODERATE risk of experiencing significant perioperative cardiovascular complications. Based on clinical review performed today (02/11/23), barring any significant acute changes in the patient's overall condition, it is anticipated that he will be able to proceed with the planned surgical intervention. Any acute changes in clinical condition may necessitate his procedure being postponed and/or cancelled. Patient will meet with anesthesia team (MD and/or CRNA) on the day of his procedure for preoperative  evaluation/assessment. Questions regarding anesthetic course will be fielded at that time.   Pre-surgical instructions were reviewed with the patient during his PAT appointment, and questions were fielded to satisfaction by PAT clinical staff. He has been instructed on which medications that he will need to hold prior to surgery, as well as the ones that have been deemed safe/appropriate to take on the day of his procedure. As part of the general education provided by PAT, patient made aware both verbally and in writing, that he would need to abstain from the use of any illegal substances during his perioperative course.  He was advised that failure to follow the provided instructions could necessitate case cancellation or result in serious perioperative complications up to and including death. Patient encouraged to contact PAT and/or his surgeon's office to discuss any questions  or concerns that may arise prior to surgery; verbalized understanding.   Quentin Mulling, MSN, APRN, FNP-C, CEN [facility]  Perioperative Services Nurse Practitioner Phone: [phone] Fax: [phone] 02/11/23 11:29 AM  NOTE: This note has been prepared using Dragon dictation software. Despite my best ability to proofread, there is always the potential that unintentional transcriptional errors may still occur from this process.

## 2023-02-11 ENCOUNTER — Encounter: Payer: Self-pay | Admitting: General Surgery

## 2023-02-11 MED ORDER — ORAL CARE MOUTH RINSE: 15.0000 mL | Freq: Once | OROMUCOSAL | Status: AC

## 2023-02-11 MED ORDER — CEFAZOLIN SODIUM-DEXTROSE 2-4 GM/100ML-% IV SOLN
2.0000 g | INTRAVENOUS | Status: AC
  Administered 2023-02-12: 2 g via INTRAVENOUS

## 2023-02-11 MED ORDER — CHLORHEXIDINE GLUCONATE 0.12 % MT SOLN
15.0000 mL | Freq: Once | OROMUCOSAL | Status: AC
  Administered 2023-02-12: 15 mL via OROMUCOSAL

## 2023-02-11 MED ORDER — LACTATED RINGERS IV SOLN: INTRAVENOUS | Status: DC

## 2023-02-12 ENCOUNTER — Encounter
Admission: RE | Disposition: A | Discharge status: Home / Self Care | Payer: Self-pay | Source: Ambulatory Visit | Attending: General Surgery

## 2023-02-12 ENCOUNTER — Ambulatory Visit
Admission: RE | Admit: 2023-02-12 | Discharge: 2023-02-12 | Disposition: A | Discharge status: Home / Self Care | Payer: 59 | Source: Ambulatory Visit | Attending: General Surgery | Admitting: General Surgery

## 2023-02-12 ENCOUNTER — Ambulatory Visit: Payer: 59 | Admitting: Urgent Care

## 2023-02-12 ENCOUNTER — Encounter: Payer: Self-pay | Admitting: General Surgery

## 2023-02-12 ENCOUNTER — Other Ambulatory Visit: Payer: Self-pay

## 2023-02-12 DIAGNOSIS — I7143 Infrarenal abdominal aortic aneurysm, without rupture: Secondary | ICD-10-CM | POA: Diagnosis not present

## 2023-02-12 DIAGNOSIS — Z79899 Other long term (current) drug therapy: Secondary | ICD-10-CM | POA: Insufficient documentation

## 2023-02-12 DIAGNOSIS — K760 Fatty (change of) liver, not elsewhere classified: Secondary | ICD-10-CM | POA: Diagnosis not present

## 2023-02-12 DIAGNOSIS — I2510 Atherosclerotic heart disease of native coronary artery without angina pectoris: Secondary | ICD-10-CM | POA: Diagnosis not present

## 2023-02-12 DIAGNOSIS — E7800 Pure hypercholesterolemia, unspecified: Secondary | ICD-10-CM | POA: Insufficient documentation

## 2023-02-12 DIAGNOSIS — Z9049 Acquired absence of other specified parts of digestive tract: Secondary | ICD-10-CM | POA: Insufficient documentation

## 2023-02-12 DIAGNOSIS — R7303 Prediabetes: Secondary | ICD-10-CM | POA: Insufficient documentation

## 2023-02-12 DIAGNOSIS — Z86718 Personal history of other venous thrombosis and embolism: Secondary | ICD-10-CM | POA: Diagnosis not present

## 2023-02-12 DIAGNOSIS — I471 Supraventricular tachycardia, unspecified: Secondary | ICD-10-CM | POA: Diagnosis not present

## 2023-02-12 DIAGNOSIS — Z7902 Long term (current) use of antithrombotics/antiplatelets: Secondary | ICD-10-CM | POA: Diagnosis not present

## 2023-02-12 DIAGNOSIS — I10 Essential (primary) hypertension: Secondary | ICD-10-CM | POA: Diagnosis not present

## 2023-02-12 DIAGNOSIS — E876 Hypokalemia: Secondary | ICD-10-CM

## 2023-02-12 DIAGNOSIS — D176 Benign lipomatous neoplasm of spermatic cord: Secondary | ICD-10-CM | POA: Insufficient documentation

## 2023-02-12 DIAGNOSIS — Z01812 Encounter for preprocedural laboratory examination: Secondary | ICD-10-CM

## 2023-02-12 DIAGNOSIS — K219 Gastro-esophageal reflux disease without esophagitis: Secondary | ICD-10-CM | POA: Diagnosis not present

## 2023-02-12 DIAGNOSIS — F17210 Nicotine dependence, cigarettes, uncomplicated: Secondary | ICD-10-CM | POA: Diagnosis not present

## 2023-02-12 DIAGNOSIS — I739 Peripheral vascular disease, unspecified: Secondary | ICD-10-CM | POA: Diagnosis not present

## 2023-02-12 DIAGNOSIS — F17200 Nicotine dependence, unspecified, uncomplicated: Secondary | ICD-10-CM | POA: Diagnosis not present

## 2023-02-12 DIAGNOSIS — K4090 Unilateral inguinal hernia, without obstruction or gangrene, not specified as recurrent: Secondary | ICD-10-CM | POA: Insufficient documentation

## 2023-02-12 DIAGNOSIS — J449 Chronic obstructive pulmonary disease, unspecified: Secondary | ICD-10-CM | POA: Diagnosis not present

## 2023-02-12 DIAGNOSIS — Z8616 Personal history of COVID-19: Secondary | ICD-10-CM | POA: Insufficient documentation

## 2023-02-12 DIAGNOSIS — I700 Atherosclerosis of aorta: Secondary | ICD-10-CM | POA: Insufficient documentation

## 2023-02-12 HISTORY — PX: INSERTION OF MESH: SHX5868

## 2023-02-12 HISTORY — DX: Acute embolism and thrombosis of unspecified deep veins of left lower extremity: I82.402

## 2023-02-12 HISTORY — DX: Hyperlipidemia, unspecified: E78.5

## 2023-02-12 HISTORY — DX: Alcohol dependence, uncomplicated: F10.20

## 2023-02-12 HISTORY — DX: Long term (current) use of anticoagulants: Z79.01

## 2023-02-12 HISTORY — DX: Atherosclerosis of aorta: I70.0

## 2023-02-12 HISTORY — DX: Palmar fascial fibromatosis (dupuytren): M72.0

## 2023-02-12 HISTORY — DX: Bilateral inguinal hernia, without obstruction or gangrene, not specified as recurrent: K40.20

## 2023-02-12 HISTORY — DX: Diverticulosis of intestine, part unspecified, without perforation or abscess without bleeding: K57.90

## 2023-02-12 HISTORY — DX: Fatty (change of) liver, not elsewhere classified: K76.0

## 2023-02-12 LAB — POCT I-STAT, CHEM 8
BUN: 7 mg/dL — ABNORMAL LOW (ref 8–23)
Calcium, Ion: 1.06 mmol/L — ABNORMAL LOW (ref 1.15–1.40)
Chloride: 90 mmol/L — ABNORMAL LOW (ref 98–111)
Creatinine, Ser: 0.7 mg/dL (ref 0.61–1.24)
Glucose, Bld: 124 mg/dL — ABNORMAL HIGH (ref 70–99)
HCT: 44 % (ref 39.0–52.0)
Hemoglobin: 15 g/dL (ref 13.0–17.0)
Potassium: 4 mmol/L (ref 3.5–5.1)
Sodium: 129 mmol/L — ABNORMAL LOW (ref 135–145)
TCO2: 27 mmol/L (ref 22–32)

## 2023-02-12 SURGERY — REPAIR, HERNIA, INGUINAL, BILATERAL, ROBOT-ASSISTED
Anesthesia: General | Site: Inguinal | Laterality: Right

## 2023-02-12 MED ORDER — LIDOCAINE HCL (CARDIAC) PF 100 MG/5ML IV SOSY
PREFILLED_SYRINGE | INTRAVENOUS | Status: DC | PRN
  Administered 2023-02-12: 80 mg via INTRAVENOUS

## 2023-02-12 MED ORDER — PROPOFOL 10 MG/ML IV BOLUS
INTRAVENOUS | Status: AC
  Filled 2023-02-12: qty 20

## 2023-02-12 MED ORDER — OXYCODONE HCL 5 MG PO TABS: 5.0000 mg | ORAL_TABLET | ORAL | 0 refills | Status: DC | PRN

## 2023-02-12 MED ORDER — KETOROLAC TROMETHAMINE 30 MG/ML IJ SOLN
INTRAMUSCULAR | Status: AC
  Filled 2023-02-12: qty 1

## 2023-02-12 MED ORDER — FENTANYL CITRATE (PF) 100 MCG/2ML IJ SOLN
INTRAMUSCULAR | Status: AC
  Filled 2023-02-12: qty 2

## 2023-02-12 MED ORDER — BUPIVACAINE-EPINEPHRINE 0.25% -1:200000 IJ SOLN
INTRAMUSCULAR | Status: DC | PRN
  Administered 2023-02-12: 20 mL

## 2023-02-12 MED ORDER — BUPIVACAINE-EPINEPHRINE (PF) 0.25% -1:200000 IJ SOLN
INTRAMUSCULAR | Status: AC
  Filled 2023-02-12: qty 30

## 2023-02-12 MED ORDER — OXYCODONE HCL 5 MG PO TABS
ORAL_TABLET | ORAL | Status: AC
  Filled 2023-02-12: qty 1

## 2023-02-12 MED ORDER — ONDANSETRON HCL 4 MG PO TABS: 4.0000 mg | ORAL_TABLET | Freq: Every day | ORAL | 0 refills | Status: DC | PRN

## 2023-02-12 MED ORDER — PROPOFOL 10 MG/ML IV BOLUS
INTRAVENOUS | Status: DC | PRN
  Administered 2023-02-12: 20 ug/kg/min via INTRAVENOUS
  Administered 2023-02-12: 200 mg via INTRAVENOUS

## 2023-02-12 MED ORDER — OXYCODONE HCL 5 MG PO TABS
5.0000 mg | ORAL_TABLET | Freq: Once | ORAL | Status: AC | PRN
  Administered 2023-02-12: 5 mg via ORAL

## 2023-02-12 MED ORDER — DEXAMETHASONE SODIUM PHOSPHATE 10 MG/ML IJ SOLN
INTRAMUSCULAR | Status: DC | PRN
  Administered 2023-02-12: 5 mg via INTRAVENOUS

## 2023-02-12 MED ORDER — DEXMEDETOMIDINE HCL IN NACL 80 MCG/20ML IV SOLN
INTRAVENOUS | Status: DC | PRN
  Administered 2023-02-12 (×2): 4 ug via INTRAVENOUS

## 2023-02-12 MED ORDER — ONDANSETRON HCL 4 MG/2ML IJ SOLN
INTRAMUSCULAR | Status: DC | PRN
  Administered 2023-02-12: 4 mg via INTRAVENOUS

## 2023-02-12 MED ORDER — SUGAMMADEX SODIUM 200 MG/2ML IV SOLN
INTRAVENOUS | Status: DC | PRN
  Administered 2023-02-12: 200 mg via INTRAVENOUS

## 2023-02-12 MED ORDER — MIDAZOLAM HCL 2 MG/2ML IJ SOLN
INTRAMUSCULAR | Status: AC
  Filled 2023-02-12: qty 2

## 2023-02-12 MED ORDER — ONDANSETRON HCL 4 MG/2ML IJ SOLN
INTRAMUSCULAR | Status: AC
  Filled 2023-02-12: qty 2

## 2023-02-12 MED ORDER — CHLORHEXIDINE GLUCONATE 0.12 % MT SOLN
OROMUCOSAL | Status: AC
  Filled 2023-02-12: qty 15

## 2023-02-12 MED ORDER — FENTANYL CITRATE (PF) 100 MCG/2ML IJ SOLN
25.0000 ug | INTRAMUSCULAR | Status: DC | PRN
  Administered 2023-02-12 (×2): 25 ug via INTRAVENOUS
  Administered 2023-02-12: 50 ug via INTRAVENOUS
  Administered 2023-02-12: 25 ug via INTRAVENOUS

## 2023-02-12 MED ORDER — ROCURONIUM BROMIDE 100 MG/10ML IV SOLN
INTRAVENOUS | Status: DC | PRN
  Administered 2023-02-12: 20 mg via INTRAVENOUS
  Administered 2023-02-12: 50 mg via INTRAVENOUS

## 2023-02-12 MED ORDER — LIDOCAINE HCL (PF) 2 % IJ SOLN
INTRAMUSCULAR | Status: AC
  Filled 2023-02-12: qty 5

## 2023-02-12 MED ORDER — OXYCODONE HCL 5 MG/5ML PO SOLN
5.0000 mg | Freq: Once | ORAL | Status: AC | PRN
Start: 2023-02-12 — End: 2023-02-12

## 2023-02-12 MED ORDER — ROCURONIUM BROMIDE 10 MG/ML (PF) SYRINGE
PREFILLED_SYRINGE | INTRAVENOUS | Status: AC
  Filled 2023-02-12: qty 10

## 2023-02-12 MED ORDER — KETOROLAC TROMETHAMINE 30 MG/ML IJ SOLN
INTRAMUSCULAR | Status: DC | PRN
  Administered 2023-02-12: 15 mg via INTRAVENOUS

## 2023-02-12 MED ORDER — 0.9 % SODIUM CHLORIDE (POUR BTL) OPTIME
TOPICAL | Status: DC | PRN
  Administered 2023-02-12: 500 mL

## 2023-02-12 MED ORDER — DEXAMETHASONE SODIUM PHOSPHATE 10 MG/ML IJ SOLN
INTRAMUSCULAR | Status: AC
  Filled 2023-02-12: qty 1

## 2023-02-12 MED ORDER — FENTANYL CITRATE (PF) 100 MCG/2ML IJ SOLN
INTRAMUSCULAR | Status: DC | PRN
  Administered 2023-02-12: 50 ug via INTRAVENOUS

## 2023-02-12 MED ORDER — ALBUTEROL SULFATE HFA 108 (90 BASE) MCG/ACT IN AERS
INHALATION_SPRAY | RESPIRATORY_TRACT | Status: DC | PRN
  Administered 2023-02-12 (×2): 6 via RESPIRATORY_TRACT

## 2023-02-12 MED ORDER — CEFAZOLIN SODIUM-DEXTROSE 2-4 GM/100ML-% IV SOLN
INTRAVENOUS | Status: AC
Start: 2023-02-12 — End: ?
  Filled 2023-02-12: qty 100

## 2023-02-12 SURGICAL SUPPLY — 48 items
ADH SKN CLS APL DERMABOND .7 (GAUZE/BANDAGES/DRESSINGS) ×2
BAG PRESSURE INF REUSE 1000 (BAG) IMPLANT
BLADE SURG SZ11 CARB STEEL (BLADE) ×2 IMPLANT
COVER TIP SHEARS 8 DVNC (MISCELLANEOUS) ×2 IMPLANT
COVER WAND RF STERILE (DRAPES) ×2 IMPLANT
DERMABOND ADVANCED .7 DNX12 (GAUZE/BANDAGES/DRESSINGS) ×2 IMPLANT
DRAPE ARM DVNC X/XI (DISPOSABLE) ×6 IMPLANT
DRAPE COLUMN DVNC XI (DISPOSABLE) ×2 IMPLANT
ELECT REM PT RETURN 9FT ADLT (ELECTROSURGICAL) ×2
ELECTRODE REM PT RTRN 9FT ADLT (ELECTROSURGICAL) ×2 IMPLANT
FORCEPS BPLR R/ABLATION 8 DVNC (INSTRUMENTS) ×2 IMPLANT
GLOVE BIO SURGEON STRL SZ 6.5 (GLOVE) ×4 IMPLANT
GLOVE BIOGEL PI IND STRL 6.5 (GLOVE) ×4 IMPLANT
GOWN STRL REUS W/ TWL LRG LVL3 (GOWN DISPOSABLE) ×6 IMPLANT
GOWN STRL REUS W/TWL LRG LVL3 (GOWN DISPOSABLE) ×6
IRRIGATOR SUCT 8 DISP DVNC XI (IRRIGATION / IRRIGATOR) IMPLANT
IV CATH ANGIO 12GX3 LT BLUE (NEEDLE) IMPLANT
IV NS 1000ML (IV SOLUTION)
IV NS 1000ML BAXH (IV SOLUTION) IMPLANT
KIT PINK PAD W/HEAD ARE REST (MISCELLANEOUS) ×2
KIT PINK PAD W/HEAD ARM REST (MISCELLANEOUS) ×2 IMPLANT
LABEL OR SOLS (LABEL) IMPLANT
MANIFOLD NEPTUNE II (INSTRUMENTS) ×2 IMPLANT
MESH 3DMAX MID 5X7 RT XLRG (Mesh General) IMPLANT
NDL DRIVE SUT CUT DVNC (INSTRUMENTS) ×2 IMPLANT
NDL HYPO 22X1.5 SAFETY MO (MISCELLANEOUS) ×2 IMPLANT
NDL INSUFFLATION 14GA 120MM (NEEDLE) ×2 IMPLANT
NEEDLE DRIVE SUT CUT DVNC (INSTRUMENTS) ×2
NEEDLE HYPO 22X1.5 SAFETY MO (MISCELLANEOUS) ×2
NEEDLE INSUFFLATION 14GA 120MM (NEEDLE) ×2
OBTURATOR OPTICAL STND 8 DVNC (TROCAR) ×2
OBTURATOR OPTICALSTD 8 DVNC (TROCAR) ×2 IMPLANT
PACK LAP CHOLECYSTECTOMY (MISCELLANEOUS) ×2 IMPLANT
SCISSORS MNPLR CVD DVNC XI (INSTRUMENTS) ×2 IMPLANT
SEAL UNIV 5-12 XI (MISCELLANEOUS) ×6 IMPLANT
SET TUBE SMOKE EVAC HIGH FLOW (TUBING) ×2 IMPLANT
SOL ELECTROSURG ANTI STICK (MISCELLANEOUS) ×2
SOLUTION ELECTROSURG ANTI STCK (MISCELLANEOUS) ×2 IMPLANT
SUT MNCRL 4-0 (SUTURE) ×2
SUT MNCRL 4-0 27XMFL (SUTURE) ×2
SUT VIC AB 2-0 SH 27 (SUTURE) ×2
SUT VIC AB 2-0 SH 27XBRD (SUTURE) ×2 IMPLANT
SUT VLOC 90 S/L VL9 GS22 (SUTURE) ×2 IMPLANT
SUTURE MNCRL 4-0 27XMF (SUTURE) ×2 IMPLANT
TAPE TRANSPORE STRL 2 31045 (GAUZE/BANDAGES/DRESSINGS) IMPLANT
TRAP FLUID SMOKE EVACUATOR (MISCELLANEOUS) ×2 IMPLANT
TRAY FOLEY MTR SLVR 16FR STAT (SET/KITS/TRAYS/PACK) ×2 IMPLANT
WATER STERILE IRR 500ML POUR (IV SOLUTION) ×2 IMPLANT

## 2023-02-12 NOTE — Discharge Instructions (Addendum)
?  Diet: Resume home heart healthy regular diet.  ? ?Activity: No heavy lifting >20 pounds (children, pets, laundry, garbage) or strenuous activity until follow-up, but light activity and walking are encouraged. Do not drive or drink alcohol if taking narcotic pain medications. ? ?Wound care: May shower with soapy water and pat dry (do not rub incisions), but no baths or submerging incision underwater until follow-up. (no swimming)  ? ?Medications: Resume all home medications. For mild to moderate pain: acetaminophen (Tylenol) or ibuprofen (if no kidney disease). Combining Tylenol with alcohol can substantially increase your risk of causing liver disease. Narcotic pain medications, if prescribed, can be used for severe pain, though may cause nausea, constipation, and drowsiness. If you do not need the narcotic pain medication, you do not need to fill the prescription. ? ?Call office ([phone]) at any time if any questions, worsening pain, fevers/chills, bleeding, drainage from incision site, or other concerns. ? ? ?AMBULATORY SURGERY  ?DISCHARGE INSTRUCTIONS ? ? ?The drugs that you were given will stay in your system until tomorrow so for the next 24 hours you should not: ? ?Drive an automobile ?Make any legal decisions ?Drink any alcoholic beverage ? ? ?You may resume regular meals tomorrow.  Today it is better to start with liquids and gradually work up to solid foods. ? ?You may eat anything you prefer, but it is better to start with liquids, then soup and crackers, and gradually work up to solid foods. ? ? ?Please notify your doctor immediately if you have any unusual bleeding, trouble breathing, redness and pain at the surgery site, drainage, fever, or pain not relieved by medication. ? ? ? ?Additional Instructions: ? ? ? ? ? ? ? ?Please contact your physician with any problems or Same Day Surgery at [phone], Monday through Friday 6 am to 4 pm, or [facility] number at [phone].  ?

## 2023-02-12 NOTE — Transfer of Care (Signed)
Immediate Anesthesia Transfer of Care Note  Patient: DOMNICK ITKIN  Procedure(s) Performed: XI ROBOTIC ASSISTED BILATERAL INGUINAL HERNIA (Bilateral: Inguinal) INSERTION OF MESH (Bilateral: Inguinal)  Patient Location: PACU  Anesthesia Type:General  Level of Consciousness: awake  Airway & Oxygen Therapy: Patient Spontanous Breathing and Patient connected to face mask oxygen  Post-op Assessment: Report given to RN and Post -op Vital signs reviewed and stable  Post vital signs: Reviewed and stable  Last Vitals:  Vitals Value Taken Time  BP 157/87 02/12/23 1324  Temp    Pulse 76 02/12/23 1329  Resp 14 02/12/23 1329  SpO2 99 % 02/12/23 1329  Vitals shown include unfiled device data.  Last Pain:  Vitals:   02/12/23 0939  TempSrc: Oral  PainSc:       Patients Stated Pain Goal: 0 (02/12/23 6387)  Complications: No notable events documented.

## 2023-02-12 NOTE — Anesthesia Procedure Notes (Signed)
Procedure Name: Intubation Date/Time: 02/12/2023 11:57 AM  Performed by: Elisabeth Pigeon, CRNAPre-anesthesia Checklist: Patient identified, Patient being monitored, Timeout performed, Emergency Drugs available and Suction available Patient Re-evaluated:Patient Re-evaluated prior to induction Oxygen Delivery Method: Circle system utilized Preoxygenation: Pre-oxygenation with 100% oxygen Induction Type: IV induction Ventilation: Mask ventilation without difficulty Laryngoscope Size: Mac, McGraph and 4 Grade View: Grade I Tube type: Oral Tube size: 7.0 mm Number of attempts: 1 Airway Equipment and Method: Stylet Placement Confirmation: ETT inserted through vocal cords under direct vision, positive ETCO2 and breath sounds checked- equal and bilateral Secured at: 22 cm Tube secured with: Tape Dental Injury: Teeth and Oropharynx as per pre-operative assessment

## 2023-02-12 NOTE — Op Note (Signed)
Preoperative diagnosis: Right inguinal hernia.   Postoperative diagnosis: Right inguinal hernia.  Procedure: Robotic assisted Laparoscopic Transabdominal preperitoneal laparoscopic (TAPP) repair of right inguinal hernia.  Anesthesia: GETA  Surgeon: Dr. Hazle Quant  Wound Classification: Clean  Indications:  Patient is a 62 y.o. male developed a symptomatic right inguinal hernia. Repair was indicated.  Findings: 1. Right indirect Inguinal hernia identified 2. Vas deferens and cord structures identified and preserved 3. Bard Extra Large 3D Max MID Anatomical mesh used for repair 4. Adequate hemostasis.   Description of procedure:  The patient was taken to the operating room and the correct side of surgery was verified. The patient was placed supine with right arm tucked at the side. After obtaining adequate anesthesia, the patient's abdomen was prepped and draped in standard sterile fashion. A time-out was completed verifying correct patient, procedure, site, positioning, and implant(s) and/or special equipment prior to beginning this procedure.  An incision was made in a natural skin line above the umbilicus. The fascia was elevated and the Veress needle inserted. Proper position was confirmed by aspiration and saline meniscus test.  The abdomen was insufflated with carbon dioxide to a pressure of 15 mmHg. The patient tolerated insufflation well.  Abdominal cavity was entered using Optiview technique with a millimeter trocar.  No injury was identified.  Another 2 mm trocars were placed lateral to each rectus muscle.  Scissors and bipolar forceps were inserted under direct visualization. At the robotic console: Transverse peritoneal incision is made about 8 cm superior to the inguinal defect. Medial to the epigastric vessels, the parietal compartment is dissected to visualize the rectus muscle. This is carried down to the symphysis pubis and the retropubic space is dissected to expose at least  2 cm contralateral to the midline. Cooper's ligament is exposed and cleared at least 2 cm below the ligament to ensure adequate space for the inferior border of the mesh. Hesselbach's triangle is cleared assessing for a direct hernia. Lateral to the epigastric vessels, the dissection is carried out in visceral compartment continuing in the true preperitoneal plane. Indirect hernia sac, was carefully reduced and separated from the cord structures with medial retraction and a combination of blunt/sharp dissection and focused cautery. This dissection was continued until the cord structures are "parietalized" completely, allowing for visualization of the reflected peritoneum that is continuous with the line originating 2 cm below Coopers medially and across the psoas muscle in the lateral compartment.  The internal ring was interrogated for a cord lipoma. The cord lipoma was reduced to the retroperitoneum and seated dorsal to the preperitoneal mesh. Having achieved a complete dissection with a critical view of the entire myopectineal orifice, an XL mesh was then positioned centered at the iliopubic tract with the medial side crossing the midline and the inferior edge positioned 2 cm below Coopers ligament. The lateral aspect of the mesh extended 3-5 cm beyond the lateral edge of the psoas. The mesh is fixated using an interrupted suture placed to the ipsilateral Coopers ligament. A second suture was done at the medial superior aspect of the mesh fixating this to the rectus complex.  The peritoneal flap is closed with running barbed suture. Additional holes in the peritoneum closed with suture. Preperitoneal space gas aspirated to visualize the peritoneum apposed directly against the mesh and ensure no folding, lifting, or buckling of the mesh. Skin is closed, sterile dressings are applied.  The patient tolerated the procedure well and was taken to the postanesthesia care unit in stable  condition  Specimen:  None  Complications: None  Estimated Blood Loss: 5 mL

## 2023-02-12 NOTE — Anesthesia Postprocedure Evaluation (Signed)
Anesthesia Post Note  Patient: Scott [name]  Procedure(s) Performed: XI ROBOTIC ASSISTED RIGHT INGUINAL HERNIA (Right: Inguinal) INSERTION OF MESH (Bilateral: Inguinal)  Patient location during evaluation: PACU Anesthesia Type: General Level of consciousness: awake and alert Pain management: pain level controlled Vital Signs Assessment: post-procedure vital signs reviewed and stable Respiratory status: spontaneous breathing, nonlabored ventilation, respiratory function stable and patient connected to nasal cannula oxygen Cardiovascular status: blood pressure returned to baseline and stable Postop Assessment: no apparent nausea or vomiting Anesthetic complications: no   No notable events documented.   Last Vitals:  Vitals:   02/12/23 1405 02/12/23 1410  BP:    Pulse: 75 73  Resp: 11 (!) 8  Temp:  (!) 36.4 C  SpO2: 94% 92%    Last Pain:  Vitals:   02/12/23 1410  TempSrc:   PainSc: 3                  Louie Boston

## 2023-02-12 NOTE — Anesthesia Preprocedure Evaluation (Signed)
Anesthesia Evaluation  Patient identified by MRN, date of birth, ID band Patient awake    Reviewed: Allergy & Precautions, NPO status , Patient's Chart, lab work & pertinent test results  History of Anesthesia Complications Negative for: history of anesthetic complications  Airway Mallampati: III  TM Distance: >3 FB Neck ROM: full    Dental  (+) Chipped   Pulmonary COPD, Current Smoker   Pulmonary exam normal        Cardiovascular hypertension, (-) angina + CAD and + Peripheral Vascular Disease  (-) DOE Normal cardiovascular exam  Hx of DVT AAA   Neuro/Psych  PSYCHIATRIC DISORDERS Anxiety Depression    negative neurological ROS     GI/Hepatic Neg liver ROS,GERD  Medicated,,  Endo/Other  negative endocrine ROS    Renal/GU      Musculoskeletal  (+) Arthritis ,    Abdominal   Peds  Hematology negative hematology ROS (+)   Anesthesia Other Findings Reviewed and agree with Carlis Stable, NP preop assessment   Past Medical History: 02/23/2020: AAA (abdominal aortic aneurysm) without rupture (HCC)     Comment:  a.) AAA duplex 02/23/2020: 3.8 cm; b.) AAA duplex               11/28/2020: 3.7 cm; c.) CTA AO + BIFEM 12/18/2020: 3.8 x               3.4 cm; d.) AAA duplex 11/07/2021: 3.7 cm; e.) CTA AO +               BIFEM 11/16/2021: 4.2 cm; f.) AAA duplex 05/28/2022: 4.0               cm; g.) AAA duplex 11/26/2022: 4.4 cm No date: Alcoholism (HCC) No date: Anginal pain (HCC) No date: Anxiety No date: Aortic atherosclerosis (HCC) No date: Arthritis 06/20/2020: Bilateral carotid artery disease (HCC)     Comment:  a.) carotid doppler 06/20/2020: 1-39% RICA, 40-59% LICA;              b.) carotid doppler 11/28/2020, 11/26/2022: 1-39% BICA No date: Bilateral inguinal hernia 06/03/2017: CAD (coronary artery disease)     Comment:  a.) LHC 06/03/2017: 25% pLAD - med mgmt; b.) LHC               08/22/2021: 25% pLAD, 15%  o-mLM, 25% dLM-pLAD, 25%               o-mLCx, 25% oD2 - med mgmt No date: COPD (chronic obstructive pulmonary disease) (HCC) No date: Coronary artery disease 2022: COVID-19 No date: Deep vein thrombosis (DVT) of left lower extremity (HCC) No date: Depression 09/17/2022: Dilatation of thoracic aorta (HCC)     Comment:  a.) CT chest 09/17/2022: 4.0 cm; b.) CT chest               01/02/2023: 3.9 cm No date: Diverticulosis No date: Dupuytren contracture of both hands No date: Dyspnea No date: GERD (gastroesophageal reflux disease) No date: Hepatic steatosis No date: HLD (hyperlipidemia) No date: Hypertension 10/01/2022: Mass of left parotid gland     Comment:  a.) PET CT 10/01/2022: 6 mm with SUV max of 6.4; b.) s.p              FNA 11/05/2022 --> pathology negative for malignancy (DDx              oncocytosis vs oncocytoma) No date: On chronic clopidogrel therapy No date: PAOD (peripheral arterial occlusive disease) (HCC)  Comment:  a.) s/p BILATERAL endarterectomies 12/29/2020 No date: Pre-diabetes No date: SVT (supraventricular tachycardia) (HCC)     Comment:  a.) s/p ablation 02/28/2016  Past Surgical History: No date: ANKLE SURGERY     Comment:  "bracelet" 2000: CHOLECYSTECTOMY No date: COLONOSCOPY 12/29/2020: ENDARTERECTOMY FEMORAL; Bilateral     Comment:  Procedure: ENDARTERECTOMY FEMORAL;  Surgeon: Annice Needy, MD;  Location: [facility] ORS;  Service: Vascular;                Laterality: Bilateral;  Left SFA and Tibial intervention 06/03/2017: LEFT HEART CATH AND CORONARY ANGIOGRAPHY; Left     Comment:  Procedure: LEFT HEART CATH AND CORONARY ANGIOGRAPHY;                Surgeon: Alwyn Pea, MD;  Location: [facility] INVASIVE              CV LAB;  Service: Cardiovascular;  Laterality: Left; 08/22/2021: LEFT HEART CATH AND CORONARY ANGIOGRAPHY; N/A     Comment:  Procedure: LEFT HEART CATH AND CORONARY ANGIOGRAPHY;                Surgeon: Alwyn Pea,  MD;  Location: [facility] INVASIVE              CV LAB;  Service: Cardiovascular;  Laterality: N/A; 02/03/2020: LOWER EXTREMITY ANGIOGRAPHY; Left     Comment:  Procedure: LOWER EXTREMITY ANGIOGRAPHY;  Surgeon: Annice Needy, MD;  Location: [facility] INVASIVE CV LAB;  Service:               Cardiovascular;  Laterality: Left; 02/28/2016: SVT ABLATION; N/A No date: TONSILLECTOMY  BMI    Body Mass Index: 24.41 kg/m      Reproductive/Obstetrics negative OB ROS                             Anesthesia Physical Anesthesia Plan  ASA: 3  Anesthesia Plan: General ETT   Post-op Pain Management: Toradol IV (intra-op)*, Ofirmev IV (intra-op)* and Ketamine IV*   Induction: Intravenous  PONV Risk Score and Plan: 2 and Ondansetron, Dexamethasone, Midazolam and Treatment may vary due to age or medical condition  Airway Management Planned: Oral ETT  Additional Equipment:   Intra-op Plan:   Post-operative Plan: Extubation in OR  Informed Consent: I have reviewed the patients History and Physical, chart, labs and discussed the procedure including the risks, benefits and alternatives for the proposed anesthesia with the patient or authorized representative who has indicated his/her understanding and acceptance.     Dental Advisory Given  Plan Discussed with: Anesthesiologist, CRNA and Surgeon  Anesthesia Plan Comments: (Patient consented for risks of anesthesia including but not limited to:  - adverse reactions to medications - damage to eyes, teeth, lips or other oral mucosa - nerve damage due to positioning  - sore throat or hoarseness - Damage to heart, brain, nerves, lungs, other parts of body or loss of life  Patient voiced understanding and assent.)        Anesthesia Quick Evaluation

## 2023-02-12 NOTE — Interval H&P Note (Signed)
History and Physical Interval Note:  02/12/2023 11:22 AM  Scott [name]  has presented today for surgery, with the diagnosis of K40.90 non recurrent unilateral inguinal hernia w/o obstruction or gangrene.  The various methods of treatment have been discussed with the patient and family. After consideration of risks, benefits and other options for treatment, the patient has consented to  Procedure(s): XI ROBOTIC ASSISTED BILATERAL INGUINAL HERNIA (Bilateral) as a surgical intervention.  [name] history has been reviewed, patient examined, no change in status, stable for surgery.  I have reviewed the patient's chart and labs.  Questions were answered to the patient's satisfaction.     Carolan Shiver

## 2023-02-13 ENCOUNTER — Telehealth: Payer: Self-pay | Admitting: Emergency Medicine

## 2023-02-13 ENCOUNTER — Encounter: Payer: Self-pay | Admitting: General Surgery

## 2023-02-13 NOTE — Telephone Encounter (Signed)
Pt called [facility] and reports that his middle incision was open when he woke from nap and had some blood from it.  Reports did stop bleeding and wound only open slightly.  Spoke with dr Maia Plan and pt unable to come to office today because he does not have a way there.  Per dr Maia Plan pt instructed to come to office at 8 am tomorrow.  He will cover with telfa and tape until then.  Pt verbalized understanding of conversation.

## 2023-02-25 ENCOUNTER — Other Ambulatory Visit: Payer: Self-pay | Admitting: *Deleted

## 2023-02-25 DIAGNOSIS — Z122 Encounter for screening for malignant neoplasm of respiratory organs: Secondary | ICD-10-CM

## 2023-02-25 DIAGNOSIS — Z87891 Personal history of nicotine dependence: Secondary | ICD-10-CM

## 2023-02-25 DIAGNOSIS — F17210 Nicotine dependence, cigarettes, uncomplicated: Secondary | ICD-10-CM

## 2023-02-28 DIAGNOSIS — J449 Chronic obstructive pulmonary disease, unspecified: Secondary | ICD-10-CM | POA: Diagnosis not present

## 2023-02-28 DIAGNOSIS — F17210 Nicotine dependence, cigarettes, uncomplicated: Secondary | ICD-10-CM | POA: Diagnosis not present

## 2023-02-28 DIAGNOSIS — R911 Solitary pulmonary nodule: Secondary | ICD-10-CM | POA: Diagnosis not present

## 2023-03-17 ENCOUNTER — Ambulatory Visit (INDEPENDENT_AMBULATORY_CARE_PROVIDER_SITE_OTHER): Payer: 59 | Admitting: Podiatry

## 2023-03-17 ENCOUNTER — Encounter: Payer: Self-pay | Admitting: Podiatry

## 2023-03-17 ENCOUNTER — Ambulatory Visit: Payer: 59 | Admitting: Pain Medicine

## 2023-03-17 DIAGNOSIS — D2370 Other benign neoplasm of skin of unspecified lower limb, including hip: Secondary | ICD-10-CM

## 2023-03-18 NOTE — Progress Notes (Signed)
  Subjective:  Patient ID: Scott [name], male    DOB: 1961-02-14,  MRN: [id number]  Chief Complaint  Patient presents with   Callouses    "I have Plantars Warts on the bottom of my feet."   Foot Swelling    "My left foot stays red and it swells all the time."    Discussed the use of AI scribe software for clinical note transcription with the patient, who gave verbal consent to proceed.  History of Present Illness   The patient, with a history of peripheral vascular disease and recent hernia surgery, presents with persistent foot warts. He was previously treated by Dr. Ether Griffins who debrided the warts and applied an acid. The patient denies using any home remedies. He reports that the warts on the left foot are more painful. He also has a callus on the right foot. He has been disabled since last January and does not do a lot of walking. He has never worn inserts or orthotics. He is a current smoker and is trying to quit with a nicotine patch.          Objective:    Physical Exam   EXTREMITIES: Left foot warm, right foot cooler. Palpable dorsalis pedis (DP) pulse on the left, weakly palpable pedal pulse (PP) on the left, and weekly palpable dorsalis pedalis (DP) pulse on the right. Capillary refill time intact. SKIN: Benign appearing skin lesions on the left foot, submetatarsal 3 and 4 on the left foot, and 4 on the right foot. Lesions debrided with a sharp scalpel to expose the lesion and enucleate the core. Salinocaine ointment applied. Callus lesion noted on the left side.       No images are attached to the encounter.    Results   Procedure: Debridement of skin lesions Description: Lesions were debrided with a sharp scalpel to expose the lesion and enucleate the core. Salinocaine ointment was applied.      Assessment:   1. Benign neoplasm of skin of lower extremity, unspecified laterality      Plan:  Patient was evaluated and treated and all questions  answered.  Assessment and Plan    Plantar Warts   Debrided warts on both feet, more severe on the left, during the visit and applied salicylic acid ointment. Recommended nightly application of over-the-counter salicylic acid (maximum strength 40%) for home use. Scheduled a follow-up in 1 month to assess progress, given previous treatments with acid and debridement were ineffective.  Callus   Advised on the use of a pumice stone and lotion for home management of callus formation on the left heel.  Foot Pressure and Pain   Suggested a trial of over-the-counter orthotics from [employer] for possible pressure points contributing to wart formation and discomfort, noting no current use of orthotics. Offered an appointment with in-house orthotist for custom orthotics if the trial is ineffective.  Follow-up   A return visit is scheduled in approximately 1 month (week before Christmas or after New Year's) to assess wart treatment progress and discuss any new issues.          Return in about 23 days (around 04/09/2023) for wart treatment.

## 2023-04-03 NOTE — Progress Notes (Unsigned)
Patient: Scott [name]  Service Category: E/M  Provider: Oswaldo Done, MD  DOB: [birth year]  DOS: 04/07/2023  Referring Provider: Alwyn Pea, MD  MRN: [id number]  Setting: Ambulatory outpatient  PCP: Luciana Axe, NP  Type: New Patient  Specialty: Interventional Pain Management    Location: Office  Delivery: Face-to-face     Primary Reason(s) for Visit: Encounter for initial evaluation of one or more chronic problems (new to examiner) potentially causing chronic pain, and posing a threat to normal musculoskeletal function. (Level of risk: High) CC: No chief complaint on file.  HPI  Scott [name] is a 62 y.o. year old, male patient, who comes for the first time to our practice referred by Dorothyann Peng D, MD for our initial evaluation of his chronic pain. He has Allergic rhinitis; Anxiety; Arthritis involving multiple sites; CAD (coronary artery disease); Chest congestion; Cigarette smoker; COPD (chronic obstructive pulmonary disease) (HCC); Daily consumption of alcohol; Dupuytren's contracture of both hands; Family history of rheumatoid arthritis; High risk medication use; Hypercholesterolemia; Hypertension; Intermittent chest pain; PAOD (peripheral arterial occlusive disease) (HCC); Rib pain on right side; Status post ablation of ventricular arrhythmia; SVT (supraventricular tachycardia) (HCC); Vitamin B12 deficiency; Vitamin D deficiency; Atherosclerosis of native arteries of extremity with intermittent claudication (HCC); AAA (abdominal aortic aneurysm) without rupture (HCC); Carotid stenosis; Atherosclerosis of native arteries of extremity with rest pain (HCC); Atherosclerosis of artery of extremity with rest pain (HCC); DVT (deep venous thrombosis) (HCC); Lymphedema; Chronic pain in left foot; H/O deep venous thrombosis; Left inguinal hernia; Right inguinal hernia; Suspected cerebrovascular accident; Peripheral edema; Chronic pain syndrome; Pharmacologic therapy; Disorder of skeletal  system; and Problems influencing health status on their problem list. Today he comes in for evaluation of his No chief complaint on file.  Pain Assessment: Location:     Radiating:   Onset:   Duration:   Quality:   Severity:  /10 (subjective, self-reported pain score)  Effect on ADL:   Timing:   Modifying factors:   BP:    HR:    Onset and Duration: {Hx; Onset and Duration:210120511} Cause of pain: {Hx; Cause:210120521} Severity: {Pain Severity:210120502} Timing: {Symptoms; Timing:210120501} Aggravating Factors: {Causes; Aggravating pain factors:210120507} Alleviating Factors: {Causes; Alleviating Factors:210120500} Associated Problems: {Hx; Associated problems:210120515} Quality of Pain: {Hx; Symptom quality or Descriptor:210120531} Previous Examinations or Tests: {Hx; Previous examinations or test:210120529} Previous Treatments: {Hx; Previous Treatment:210120503}  Scott [name] is being evaluated for possible interventional pain management therapies for the treatment of his chronic pain.  Discussed the use of AI scribe software for clinical note transcription with the patient, who gave verbal consent to proceed.  History of Present Illness           *** Scott [name] has been informed that this initial visit was an evaluation only.  On the follow up appointment I will go over the results, including ordered tests and available interventional therapies. At that time he will have the opportunity to decide whether to proceed with offered therapies or not. In the event that Scott [name] prefers avoiding interventional options, this will conclude our involvement in the case.  Medication management recommendations may be provided upon request.  Patient informed that diagnostic tests may be ordered to assist in identifying underlying causes, narrow the list of differential diagnoses and aid in determining candidacy for (or contraindications to) planned therapeutic interventions.  Historic  Controlled Substance Pharmacotherapy Review  PMP and historical list of controlled substances: Oxycodone Hcl (Ir) 5 Mg Tablet ;Pregabalin 25 Mg Capsule ;  Gabapentin 300 Mg Capsule  Most recently prescribed opioid analgesics:   None MME/day: 0 mg/day  Historical Monitoring: The patient  reports no history of drug use. List of prior UDS Testing: No results found for: "MDMA", "COCAINSCRNUR", "PCPSCRNUR", "PCPQUANT", "CANNABQUANT", "THCU", "ETH", "CBDTHCR", "D8THCCBX", "D9THCCBX" Historical Background Evaluation: [place] P[place]: PDMP reviewed during this encounter. Review of the past 48-months conducted.             PMP NARX Score Report:  Narcotic: 200 Sedative: 160 Stimulant: 000 [place] Department of public safety, offender search: Engineer, mining Information) Non-contributory Risk Assessment Profile: Aberrant behavior: None observed or detected today Risk factors for fatal opioid overdose: None identified today PMP NARX Overdose Risk Score: 270 Fatal overdose hazard ratio (HR): Calculation deferred Non-fatal overdose hazard ratio (HR): Calculation deferred Risk of opioid abuse or dependence: 0.7-3.0% with doses <= 36 MME/day and 6.1-26% with doses >= 120 MME/day. Substance use disorder (SUD) risk level: See below Personal History of Substance Abuse (SUD-Substance use disorder):  Alcohol:    Illegal Drugs:    Rx Drugs:    ORT Risk Level calculation:    ORT Scoring interpretation table:  Score <3 = Low Risk for SUD  Score between 4-7 = Moderate Risk for SUD  Score >8 = High Risk for Opioid Abuse   PHQ-2 Depression Scale:  Total score:    PHQ-2 Scoring interpretation table: (Score and probability of major depressive disorder)  Score 0 = No depression  Score 1 = 15.4% Probability  Score 2 = 21.1% Probability  Score 3 = 38.4% Probability  Score 4 = 45.5% Probability  Score 5 = 56.4% Probability  Score 6 = 78.6% Probability   PHQ-9 Depression Scale:  Total score:    PHQ-9 Scoring interpretation  table:  Score 0-4 = No depression  Score 5-9 = Mild depression  Score 10-14 = Moderate depression  Score 15-19 = Moderately severe depression  Score 20-27 = Severe depression (2.4 times higher risk of SUD and 2.89 times higher risk of overuse)   Pharmacologic Plan: As per protocol, I have not taken over any controlled substance management, pending the results of ordered tests and/or consults.            Initial impression: Pending review of available data and ordered tests.  Meds   Current Outpatient Medications:    albuterol (VENTOLIN HFA) 108 (90 Base) MCG/ACT inhaler, Inhale 2 puffs into the lungs every 6 (six) hours as needed., Disp: , Rfl:    atorvastatin (LIPITOR) 40 MG tablet, Take 40 mg by mouth daily., Disp: , Rfl:    budesonide-formoterol (SYMBICORT) 160-4.5 MCG/ACT inhaler, Inhale 2 puffs into the lungs., Disp: , Rfl:    cholecalciferol (VITAMIN D3) 25 MCG (1000 UNIT) tablet, Take 1,000 Units by mouth daily., Disp: , Rfl:    cloNIDine (CATAPRES) 0.1 MG tablet, Take 0.1 mg by mouth 2 (two) times daily., Disp: , Rfl:    clopidogrel (PLAVIX) 75 MG tablet, Take 75 mg by mouth daily., Disp: , Rfl:    famotidine (PEPCID) 20 MG tablet, Take 20 mg by mouth 2 (two) times daily as needed for heartburn or indigestion., Disp: , Rfl:    furosemide (LASIX) 20 MG tablet, Take 20 mg by mouth daily., Disp: , Rfl:    ipratropium-albuterol (DUONEB) 0.5-2.5 (3) MG/3ML SOLN, Inhale 3 mLs into the lungs every 6 (six) hours as needed., Disp: , Rfl:    isosorbide mononitrate (IMDUR) 30 MG 24 hr tablet, Take 30 mg by mouth daily as  needed., Disp: , Rfl: 5   lisinopril-hydrochlorothiazide (ZESTORETIC) 20-12.5 MG tablet, Take 1 tablet by mouth 2 (two) times daily., Disp: , Rfl:    metoprolol succinate (TOPROL-XL) 100 MG 24 hr tablet, Take 100 mg by mouth daily. Take with or immediately following a meal., Disp: , Rfl:    ondansetron (ZOFRAN) 4 MG tablet, Take 1 tablet (4 mg total) by mouth daily as needed  for nausea or vomiting. (Patient not taking: Reported on 03/17/2023), Disp: 10 tablet, Rfl: 0   oxyCODONE (ROXICODONE) 5 MG immediate release tablet, Take 1 tablet (5 mg total) by mouth every 4 (four) hours as needed for severe pain (pain score 7-10). (Patient not taking: Reported on 03/17/2023), Disp: 30 tablet, Rfl: 0   potassium chloride SA (KLOR-CON M) 20 MEQ tablet, Take 2 tablets (40 mEq) today, then take 1 tablet (20 mEq) daily until surgery. Be sure to take dose on day of procedure. Follow up with PCP for repeat labs., Disp: 9 tablet, Rfl: 0   vitamin B-12 (CYANOCOBALAMIN) 500 MCG tablet, Take 500 mcg by mouth daily., Disp: , Rfl:   Imaging Review  Elbow Imaging: Elbow-L DG Complete: Results for orders placed during the hospital encounter of 10/09/16 DG Elbow Complete Left  Narrative CLINICAL DATA:  Left elbow pain since 2 episodes of blunt trauma in the last week.  EXAM: LEFT ELBOW - COMPLETE 3+ VIEW  COMPARISON:  None  FINDINGS: There is calcification at the medial and lateral epicondyles consistent with chronic medial and lateral epicondylitis. There is no fracture or dislocation. No joint effusion. No significant soft tissue swelling.  IMPRESSION: Chronic degenerative changes of the medial and lateral epicondyles of the distal humerus. No acute abnormality.   Electronically Signed By: Francene Boyers M.D. On: 10/09/2016 15:29  Complexity Note: Imaging results reviewed.                         ROS  Cardiovascular: {Hx; Cardiovascular History:210120525} Pulmonary or Respiratory: {Hx; Pumonary and/or Respiratory History:210120523} Neurological: {Hx; Neurological:210120504} Psychological-Psychiatric: {Hx; Psychological-Psychiatric History:210120512} Gastrointestinal: {Hx; Gastrointestinal:210120527} Genitourinary: {Hx; Genitourinary:210120506} Hematological: {Hx; Hematological:210120510} Endocrine: {Hx; Endocrine history:210120509} Rheumatologic: {Hx;  Rheumatological:210120530} Musculoskeletal: {Hx; Musculoskeletal:210120528} Work History: {Hx; Work history:210120514}  Allergies  Scott [name] is allergic to acetaminophen-codeine, chantix [varenicline], and codeine.  Laboratory Chemistry Profile   Renal Lab Results  Component Value Date   BUN 7 (L) 02/12/2023   CREATININE 0.70 02/12/2023   GFRAA >60 06/03/2017   GFRNONAA >60 02/05/2023     Electrolytes Lab Results  Component Value Date   NA 129 (L) 02/12/2023   K 4.0 02/12/2023   CL 90 (L) 02/12/2023   CALCIUM 9.1 02/05/2023   MG 2.3 02/05/2023     Hepatic Lab Results  Component Value Date   AST 27 01/07/2021   ALT 27 01/07/2021   ALBUMIN 3.5 01/07/2021   ALKPHOS 78 01/07/2021     ID Lab Results  Component Value Date   SARSCOV2NAA NEGATIVE 12/28/2020     Bone No results found for: "VD25OH", "VD125OH2TOT", "WU9811BJ4", "NW2956OZ3", "25OHVITD1", "25OHVITD2", "25OHVITD3", "TESTOFREE", "TESTOSTERONE"   Endocrine Lab Results  Component Value Date   GLUCOSE 124 (H) 02/12/2023   HGBA1C 5.6 06/03/2017     Neuropathy Lab Results  Component Value Date   HGBA1C 5.6 06/03/2017     CNS No results found for: "COLORCSF", "APPEARCSF", "RBCCOUNTCSF", "WBCCSF", "POLYSCSF", "LYMPHSCSF", "EOSCSF", "PROTEINCSF", "GLUCCSF", "JCVIRUS", "CSFOLI", "IGGCSF", "LABACHR", "ACETBL"   Inflammation (CRP: Acute  ESR: Chronic) Lab Results  Component Value Date   LATICACIDVEN 1.5 01/07/2021     Rheumatology No results found for: "RF", "ANA", "LABURIC", "URICUR", "LYMEIGGIGMAB", "LYMEABIGMQN", "HLAB27"   Coagulation Lab Results  Component Value Date   INR 0.96 06/03/2017   LABPROT 12.7 06/03/2017   APTT 28 06/03/2017   PLT 188 02/05/2023     Cardiovascular Lab Results  Component Value Date   BNP 64.9 08/01/2021   TROPONINI <0.03 06/03/2017   HGB 15.0 02/12/2023   HCT 44.0 02/12/2023     Screening Lab Results  Component Value Date   SARSCOV2NAA NEGATIVE 12/28/2020      Cancer No results found for: "CEA", "CA125", "LABCA2"   Allergens No results found for: "ALMOND", "APPLE", "ASPARAGUS", "AVOCADO", "BANANA", "BARLEY", "BASIL", "BAYLEAF", "GREENBEAN", "LIMABEAN", "WHITEBEAN", "BEEFIGE", "REDBEET", "BLUEBERRY", "BROCCOLI", "CABBAGE", "MELON", "CARROT", "CASEIN", "CASHEWNUT", "CAULIFLOWER", "CELERY"     Note: Lab results reviewed.  PFSH  Drug: Scott [name]  reports no history of drug use. Alcohol:  reports current alcohol use. Tobacco:  reports that he has been smoking cigarettes. He has never used smokeless tobacco. 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 Family: family history includes Cancer in his brother and brother; Diabetes in his mother; Heart attack in his father; Hypertension in his mother; Lupus in his mother.  Past Surgical History:  Procedure Laterality Date   ANKLE SURGERY     "bracelet"   CHOLECYSTECTOMY  2000   COLONOSCOPY     ENDARTERECTOMY FEMORAL Bilateral 12/29/2020   Procedure: ENDARTERECTOMY FEMORAL;  Surgeon: Annice Needy, MD;  Location: [facility];  Service: Vascular;  Laterality: Bilateral;  Left SFA and Tibial  intervention   INSERTION OF MESH Bilateral 02/12/2023   Procedure: INSERTION OF MESH;  Surgeon: Carolan Shiver, MD;  Location: [facility];  Service: General;  Laterality: Bilateral;   LEFT HEART CATH AND CORONARY ANGIOGRAPHY Left 06/03/2017   Procedure: LEFT HEART CATH AND CORONARY ANGIOGRAPHY;  Surgeon: Alwyn Pea, MD;  Location: [facility];  Service: Cardiovascular;  Laterality: Left;   LEFT HEART CATH AND CORONARY ANGIOGRAPHY N/A 08/22/2021   Procedure: LEFT HEART CATH AND CORONARY ANGIOGRAPHY;  Surgeon: Alwyn Pea, MD;  Location: [facility];  Service: Cardiovascular;  Laterality: N/A;   LOWER EXTREMITY ANGIOGRAPHY Left 02/03/2020   Procedure: LOWER EXTREMITY ANGIOGRAPHY;  Surgeon: Annice Needy, MD;  Location: [facility];  Service: Cardiovascular;  Laterality: Left;   SVT ABLATION N/A 02/28/2016   TONSILLECTOMY     Active Ambulatory Problems    Diagnosis Date Noted   Allergic rhinitis 12/03/2013   Anxiety 12/03/2013   Arthritis involving multiple sites 03/28/2017   CAD (coronary artery disease) 12/03/2013   Chest congestion 05/27/2018   Cigarette smoker 06/19/2015   COPD (chronic obstructive pulmonary disease) (HCC) 12/03/2013   Daily consumption of alcohol 03/28/2017   Dupuytren's contracture of both hands 03/28/2017   Family history of rheumatoid arthritis 03/28/2017   High  risk medication use 03/03/2018   Hypercholesterolemia 06/19/2015   Hypertension 12/03/2013   Intermittent chest pain 03/28/2017   PAOD (peripheral arterial occlusive disease) (HCC) 01/21/2020   Rib pain on right side 05/27/2018   Status post ablation of ventricular arrhythmia 03/28/2017   SVT (supraventricular tachycardia) (HCC) 12/20/2015   Vitamin B12 deficiency 09/02/2018   Vitamin D deficiency 09/02/2018   Atherosclerosis of native arteries of extremity with intermittent claudication (HCC) 02/01/2020   AAA (abdominal aortic aneurysm) without rupture  (HCC) 11/28/2020   Carotid stenosis 11/28/2020   Atherosclerosis of native arteries of extremity with rest pain (HCC) 12/26/2020   Atherosclerosis of artery of extremity with rest pain (HCC) 12/29/2020   DVT (deep venous thrombosis) (HCC) 01/09/2021   Lymphedema 11/26/2022   Chronic pain in left foot 06/14/2022   H/O deep venous thrombosis 01/09/2021   Left inguinal hernia 01/16/2023   Right inguinal hernia 01/16/2023   Suspected cerebrovascular accident 05/03/2022   Peripheral edema 03/19/2021   Chronic pain syndrome 04/06/2023   Pharmacologic therapy 04/06/2023   Disorder of skeletal system 04/06/2023   Problems influencing health status 04/06/2023   Resolved Ambulatory Problems    Diagnosis Date Noted   No Resolved Ambulatory Problems   Past Medical History:  Diagnosis Date   Alcoholism (HCC)    Anginal pain (HCC)    Aortic atherosclerosis (HCC)    Arthritis    Bilateral carotid artery disease (HCC) 06/20/2020   Bilateral inguinal hernia    Coronary artery disease    COVID-19 2022   Deep vein thrombosis (DVT) of left lower extremity (HCC)    Depression    Dilatation of thoracic aorta (HCC) 09/17/2022   Diverticulosis    Dupuytren contracture of both hands    Dyspnea    GERD (gastroesophageal reflux disease)    Hepatic steatosis    HLD (hyperlipidemia)    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   On chronic clopidogrel therapy    Pre-diabetes    Constitutional Exam  General appearance: Well nourished, well developed, and well hydrated. In no apparent acute distress There were no vitals filed for this visit. BMI Assessment: Estimated body mass index is 24.41 kg/m as calculated from the following:   Height as of 02/12/23: 5\' 11"  (1.803 m).   Weight as of 02/12/23: 175 lb (79.4 kg).  BMI interpretation table: BMI level Category Range association with higher incidence of chronic pain  <18 kg/m2 Underweight   18.5-24.9 kg/m2 Ideal body weight   25-29.9 kg/m2  Overweight Increased incidence by 20%  30-34.9 kg/m2 Obese (Class I) Increased incidence by 68%  35-39.9 kg/m2 Severe obesity (Class II) Increased incidence by 136%  >40 kg/m2 Extreme obesity (Class III) Increased incidence by 254%   Patient's current BMI Ideal Body weight  There is no height or weight on file to calculate BMI. Patient weight not recorded   BMI Readings from Last 4 Encounters:  02/12/23 24.41 kg/m  02/04/23 24.41 kg/m  11/26/22 25.80 kg/m  05/28/22 24.97 kg/m   Wt Readings from Last 4 Encounters:  02/12/23 175 lb (79.4 kg)  02/04/23 175 lb (79.4 kg)  11/26/22 179 lb 12.8 oz (81.6 kg)  05/28/22 174 lb (78.9 kg)    Psych/Mental status: Alert, oriented x 3 (person, place, & time)       Eyes: PERLA Respiratory: No evidence of acute respiratory distress  Assessment  Primary Diagnosis & Pertinent Problem List: The primary encounter diagnosis was Chronic pain syndrome. Diagnoses of Pharmacologic therapy, Disorder of  skeletal system, Problems influencing health status, Vitamin B12 deficiency, and Vitamin D deficiency were also pertinent to this visit.  Visit Diagnosis (New problems to examiner): 1. Chronic pain syndrome   2. Pharmacologic therapy   3. Disorder of skeletal system   4. Problems influencing health status   5. Vitamin B12 deficiency   6. Vitamin D deficiency    Plan of Care (Initial workup plan)  Note: Scott [name] was reminded that as per protocol, today's visit has been an evaluation only. We have not taken over the patient's controlled substance management.  Problem-specific plan: Assessment and Plan            Lab Orders  No laboratory test(s) ordered today   Imaging Orders  No imaging studies ordered today   Referral Orders  No referral(s) requested today   Procedure Orders    No procedure(s) ordered today   Pharmacotherapy (current): Medications ordered:  No orders of the defined types were placed in this  encounter.  Medications administered during this visit: Scott [name] had no medications administered during this visit.   Analgesic Pharmacotherapy:  Opioid Analgesics: For patients currently taking or requesting to take opioid analgesics, in accordance with [facility] Guidelines, we will assess their risks and indications for the use of these substances. After completing our evaluation, we may offer recommendations, but we no longer take patients for medication management. The prescribing physician will ultimately decide, based on his/her training and level of comfort whether to adopt any of the recommendations, including whether or not to prescribe such medicines.  Membrane stabilizer: To be determined at a later time  Muscle relaxant: To be determined at a later time  NSAID: To be determined at a later time  Other analgesic(s): To be determined at a later time   Interventional management options: Mr. Schmied was informed that there is no guarantee that he would be a candidate for interventional therapies. The decision will be based on the results of diagnostic studies, as well as Mr. Ree risk profile.  Procedure(s) under consideration:  Pending results of ordered studies      Interventional Therapies  Risk Factors  Considerations  Medical Comorbidities:     Planned  Pending:      Under consideration:   Pending   Completed:   None at this time   Therapeutic  Palliative (PRN) options:   None established   Completed by other providers:   None reported      Provider-requested follow-up: No follow-ups on file.  Future Appointments  Date Time Provider Department Center  04/07/2023 11:00 AM Delano Metz, MD [facility] None  04/28/2023 11:00 AM Edwin Cap, DPM [facility]  06/10/2023  9:00 AM [facility] None  06/10/2023  9:30 AM [facility] None  06/10/2023 10:15 AM Dew, Marlow Baars, MD [facility] None    Duration  of encounter: *** minutes.  Total time on encounter, as per AMA guidelines included both the face-to-face and non-face-to-face time personally spent by the physician and/or other qualified health care professional(s) on the day of the encounter (includes time in activities that require the physician or other qualified health care professional and does not include time in activities normally performed by clinical staff). Physician's time may include the following activities when performed: Preparing to see the patient (e.g., pre-charting review of records, searching for previously ordered imaging, lab work, and nerve conduction tests) Review of prior analgesic pharmacotherapies. Reviewing PMP Interpreting ordered tests (  e.g., lab work, imaging, nerve conduction tests) Performing post-procedure evaluations, including interpretation of diagnostic procedures Obtaining and/or reviewing separately obtained history Performing a medically appropriate examination and/or evaluation Counseling and educating the patient/family/caregiver Ordering medications, tests, or procedures Referring and communicating with other health care professionals (when not separately reported) Documenting clinical information in the electronic or other health record Independently interpreting results (not separately reported) and communicating results to the patient/ family/caregiver Care coordination (not separately reported)  Note by: Oswaldo Done, MD (AI and TTS technology used. I apologize for any typographical errors that were not detected and corrected.) Date: 04/07/2023; Time: 4:43 PM

## 2023-04-06 DIAGNOSIS — G894 Chronic pain syndrome: Secondary | ICD-10-CM | POA: Insufficient documentation

## 2023-04-06 DIAGNOSIS — M899 Disorder of bone, unspecified: Secondary | ICD-10-CM | POA: Insufficient documentation

## 2023-04-06 DIAGNOSIS — Z79899 Other long term (current) drug therapy: Secondary | ICD-10-CM | POA: Insufficient documentation

## 2023-04-06 DIAGNOSIS — G8929 Other chronic pain: Secondary | ICD-10-CM | POA: Insufficient documentation

## 2023-04-06 DIAGNOSIS — Z789 Other specified health status: Secondary | ICD-10-CM | POA: Insufficient documentation

## 2023-04-06 NOTE — Patient Instructions (Signed)

## 2023-04-07 ENCOUNTER — Ambulatory Visit: Payer: 59 | Attending: Pain Medicine | Admitting: Pain Medicine

## 2023-04-07 ENCOUNTER — Encounter: Payer: Self-pay | Admitting: Pain Medicine

## 2023-04-07 VITALS — BP 141/98 | HR 85 | Temp 97.7°F | Resp 18 | Ht 71.0 in | Wt 175.0 lb

## 2023-04-07 DIAGNOSIS — F419 Anxiety disorder, unspecified: Secondary | ICD-10-CM | POA: Diagnosis not present

## 2023-04-07 DIAGNOSIS — I2510 Atherosclerotic heart disease of native coronary artery without angina pectoris: Secondary | ICD-10-CM | POA: Insufficient documentation

## 2023-04-07 DIAGNOSIS — E7800 Pure hypercholesterolemia, unspecified: Secondary | ICD-10-CM | POA: Insufficient documentation

## 2023-04-07 DIAGNOSIS — R200 Anesthesia of skin: Secondary | ICD-10-CM | POA: Diagnosis not present

## 2023-04-07 DIAGNOSIS — J439 Emphysema, unspecified: Secondary | ICD-10-CM | POA: Diagnosis not present

## 2023-04-07 DIAGNOSIS — I739 Peripheral vascular disease, unspecified: Secondary | ICD-10-CM | POA: Insufficient documentation

## 2023-04-07 DIAGNOSIS — I471 Supraventricular tachycardia, unspecified: Secondary | ICD-10-CM | POA: Diagnosis not present

## 2023-04-07 DIAGNOSIS — Z7982 Long term (current) use of aspirin: Secondary | ICD-10-CM | POA: Diagnosis not present

## 2023-04-07 DIAGNOSIS — S40022A Contusion of left upper arm, initial encounter: Secondary | ICD-10-CM | POA: Insufficient documentation

## 2023-04-07 DIAGNOSIS — M7989 Other specified soft tissue disorders: Secondary | ICD-10-CM | POA: Diagnosis not present

## 2023-04-07 DIAGNOSIS — R0789 Other chest pain: Secondary | ICD-10-CM | POA: Diagnosis not present

## 2023-04-07 DIAGNOSIS — K4090 Unilateral inguinal hernia, without obstruction or gangrene, not specified as recurrent: Secondary | ICD-10-CM | POA: Diagnosis not present

## 2023-04-07 DIAGNOSIS — F17210 Nicotine dependence, cigarettes, uncomplicated: Secondary | ICD-10-CM | POA: Insufficient documentation

## 2023-04-07 DIAGNOSIS — E538 Deficiency of other specified B group vitamins: Secondary | ICD-10-CM | POA: Insufficient documentation

## 2023-04-07 DIAGNOSIS — Z7901 Long term (current) use of anticoagulants: Secondary | ICD-10-CM | POA: Insufficient documentation

## 2023-04-07 DIAGNOSIS — M79601 Pain in right arm: Secondary | ICD-10-CM | POA: Insufficient documentation

## 2023-04-07 DIAGNOSIS — S0990XA Unspecified injury of head, initial encounter: Secondary | ICD-10-CM | POA: Diagnosis not present

## 2023-04-07 DIAGNOSIS — R600 Localized edema: Secondary | ICD-10-CM | POA: Diagnosis not present

## 2023-04-07 DIAGNOSIS — I10 Essential (primary) hypertension: Secondary | ICD-10-CM | POA: Insufficient documentation

## 2023-04-07 DIAGNOSIS — I714 Abdominal aortic aneurysm, without rupture, unspecified: Secondary | ICD-10-CM | POA: Diagnosis not present

## 2023-04-07 DIAGNOSIS — Y92009 Unspecified place in unspecified non-institutional (private) residence as the place of occurrence of the external cause: Secondary | ICD-10-CM | POA: Insufficient documentation

## 2023-04-07 DIAGNOSIS — M79672 Pain in left foot: Secondary | ICD-10-CM | POA: Insufficient documentation

## 2023-04-07 DIAGNOSIS — M79661 Pain in right lower leg: Secondary | ICD-10-CM | POA: Diagnosis not present

## 2023-04-07 DIAGNOSIS — E559 Vitamin D deficiency, unspecified: Secondary | ICD-10-CM | POA: Diagnosis not present

## 2023-04-07 DIAGNOSIS — M25552 Pain in left hip: Secondary | ICD-10-CM | POA: Insufficient documentation

## 2023-04-07 DIAGNOSIS — G8929 Other chronic pain: Secondary | ICD-10-CM | POA: Insufficient documentation

## 2023-04-07 DIAGNOSIS — M79605 Pain in left leg: Secondary | ICD-10-CM | POA: Insufficient documentation

## 2023-04-07 DIAGNOSIS — Z789 Other specified health status: Secondary | ICD-10-CM

## 2023-04-07 DIAGNOSIS — W19XXXA Unspecified fall, initial encounter: Secondary | ICD-10-CM | POA: Insufficient documentation

## 2023-04-07 DIAGNOSIS — G894 Chronic pain syndrome: Secondary | ICD-10-CM

## 2023-04-07 DIAGNOSIS — M899 Disorder of bone, unspecified: Secondary | ICD-10-CM | POA: Diagnosis not present

## 2023-04-07 DIAGNOSIS — M79602 Pain in left arm: Secondary | ICD-10-CM | POA: Insufficient documentation

## 2023-04-07 DIAGNOSIS — Z86718 Personal history of other venous thrombosis and embolism: Secondary | ICD-10-CM | POA: Insufficient documentation

## 2023-04-07 DIAGNOSIS — Z79899 Other long term (current) drug therapy: Secondary | ICD-10-CM | POA: Insufficient documentation

## 2023-04-07 DIAGNOSIS — Z7902 Long term (current) use of antithrombotics/antiplatelets: Secondary | ICD-10-CM | POA: Insufficient documentation

## 2023-04-07 NOTE — Progress Notes (Signed)
Safety precautions to be maintained throughout the outpatient stay will include: orient to surroundings, keep bed in low position, maintain call bell within reach at all times, provide assistance with transfer out of bed and ambulation.   Patient reported to RN that he had a fall on 03/30/23. He states that he slipped from his rug at home. He landed on his left arm and hit his head as well. States he briefly loss level of consciousness. He states that he did not seek medical attention. Patient left arm is extremely bruised and small amount of bleeding in the forearm lateral of the elbow.  Patient states he decided on his own to stop his blood thinner, Plavix, stating "I was bleeding so much and could not get it to stop so I decided to stop the Plavix". Patient states he instead is taking ASA 81mg  x2 daily. Patient states his lower left leg and foot has increase in swelling since stopping the Plavix. Pt has history of blood clot.   Advised patient to not stop any blood thinners without consulting with cardiologist or his primary care doctor. Informed him he should call his PCP or cardiologist today to make an appointment and be seen. Dr. Shireen Quan has been made aware of this situation as well.   RN contacted his primary care clinic at [facility] in [place] to see if they can see him. They made an appointment for him to be seen today at 3pm. Patient has beenn made aware and verbalizes understanding and importance of going to see appointment.

## 2023-04-11 ENCOUNTER — Telehealth (INDEPENDENT_AMBULATORY_CARE_PROVIDER_SITE_OTHER): Payer: Self-pay

## 2023-04-11 ENCOUNTER — Other Ambulatory Visit: Payer: Self-pay | Admitting: Internal Medicine

## 2023-04-11 DIAGNOSIS — M7989 Other specified soft tissue disorders: Secondary | ICD-10-CM

## 2023-04-11 DIAGNOSIS — I739 Peripheral vascular disease, unspecified: Secondary | ICD-10-CM

## 2023-04-11 NOTE — Telephone Encounter (Signed)
I spoke with Vivia Birmingham NP and she recommended for the patient to be seen at the ED for evaluation. If the schedule allows the patient can be schedule for left le dvt study. Patient verbalized understanding to medical recommendations.

## 2023-04-11 NOTE — Telephone Encounter (Signed)
Patient left a message stating that he fell about 10 or 11 days ago. Patient is experience extreme swelling and redness  with left leg from the leg down to the foot. Patient was seen by his pain clinic provider and family physician. Both providers recommended for the patient to contact our office to schedule appointment. Patient is taking Plavix but is asking if he should restart Eliquis and if so if he will need prescription. Please Advise

## 2023-04-17 ENCOUNTER — Ambulatory Visit
Admission: RE | Admit: 2023-04-17 | Discharge: 2023-04-17 | Disposition: A | Discharge status: Home / Self Care | Payer: 59 | Source: Ambulatory Visit | Attending: Internal Medicine | Admitting: Internal Medicine

## 2023-04-17 DIAGNOSIS — I739 Peripheral vascular disease, unspecified: Secondary | ICD-10-CM

## 2023-04-17 DIAGNOSIS — M7989 Other specified soft tissue disorders: Secondary | ICD-10-CM

## 2023-04-17 DIAGNOSIS — R600 Localized edema: Secondary | ICD-10-CM | POA: Diagnosis not present

## 2023-04-25 DIAGNOSIS — Z86718 Personal history of other venous thrombosis and embolism: Secondary | ICD-10-CM | POA: Diagnosis not present

## 2023-04-25 DIAGNOSIS — Z1331 Encounter for screening for depression: Secondary | ICD-10-CM | POA: Diagnosis not present

## 2023-04-25 DIAGNOSIS — Z79899 Other long term (current) drug therapy: Secondary | ICD-10-CM | POA: Diagnosis not present

## 2023-04-25 DIAGNOSIS — Z0000 Encounter for general adult medical examination without abnormal findings: Secondary | ICD-10-CM | POA: Diagnosis not present

## 2023-04-25 DIAGNOSIS — E7800 Pure hypercholesterolemia, unspecified: Secondary | ICD-10-CM | POA: Diagnosis not present

## 2023-04-25 DIAGNOSIS — I70229 Atherosclerosis of native arteries of extremities with rest pain, unspecified extremity: Secondary | ICD-10-CM | POA: Diagnosis not present

## 2023-04-25 DIAGNOSIS — I25118 Atherosclerotic heart disease of native coronary artery with other forms of angina pectoris: Secondary | ICD-10-CM | POA: Diagnosis not present

## 2023-04-25 DIAGNOSIS — I10 Essential (primary) hypertension: Secondary | ICD-10-CM | POA: Diagnosis not present

## 2023-04-25 DIAGNOSIS — R079 Chest pain, unspecified: Secondary | ICD-10-CM | POA: Diagnosis not present

## 2023-04-25 DIAGNOSIS — I714 Abdominal aortic aneurysm, without rupture, unspecified: Secondary | ICD-10-CM | POA: Diagnosis not present

## 2023-04-25 DIAGNOSIS — Z9889 Other specified postprocedural states: Secondary | ICD-10-CM | POA: Diagnosis not present

## 2023-04-25 DIAGNOSIS — Z1339 Encounter for screening examination for other mental health and behavioral disorders: Secondary | ICD-10-CM | POA: Diagnosis not present

## 2023-04-25 DIAGNOSIS — I471 Supraventricular tachycardia, unspecified: Secondary | ICD-10-CM | POA: Diagnosis not present

## 2023-04-25 DIAGNOSIS — J449 Chronic obstructive pulmonary disease, unspecified: Secondary | ICD-10-CM | POA: Diagnosis not present

## 2023-04-28 ENCOUNTER — Ambulatory Visit (INDEPENDENT_AMBULATORY_CARE_PROVIDER_SITE_OTHER): Payer: 59 | Admitting: Podiatry

## 2023-04-28 ENCOUNTER — Encounter: Payer: Self-pay | Admitting: Podiatry

## 2023-04-28 DIAGNOSIS — D2370 Other benign neoplasm of skin of unspecified lower limb, including hip: Secondary | ICD-10-CM

## 2023-04-28 NOTE — Progress Notes (Signed)
  Subjective:  Patient ID: Scott [name], male    DOB: 04-03-1961,  MRN: [id number]  Chief Complaint  Patient presents with   Plantar Warts    It started improving but now, I'm back to where I started.  I had a bad fall so I wasn't able to do much to it.    Discussed the use of AI scribe software for clinical note transcription with the patient, who gave verbal consent to proceed.  History of Present Illness   He returns for follow-up has had some skin peeling he was unable to find a salicylic acid         Objective:    Physical Exam   EXTREMITIES: Left foot warm, right foot cooler. Palpable dorsalis pedis (DP) pulse on the left, weakly palpable pedal pulse (PP) on the left, and weekly palpable dorsalis pedalis (DP) pulse on the right. Capillary refill time intact. SKIN: Benign appearing skin lesions on the left foot, submetatarsal 3 and 4 on the left foot, and 4 on the right foot.        No images are attached to the encounter.    Results   Procedure: Debridement of skin lesions Description: Lesions were debrided with a sharp scalpel to expose the lesion and enucleate the core. Salinocaine ointment was applied.      Assessment:   Encounter Diagnosis  Name Primary?   Benign neoplasm of skin of lower extremity, unspecified laterality Yes       Plan:  Patient was evaluated and treated and all questions answered.  Assessment and Plan    Plantar Warts   Debrided warts on both feet to expose the lesions and applied salicylic acid ointment.  Follow-up in 4 weeks to retreat lesions

## 2023-05-08 DIAGNOSIS — E559 Vitamin D deficiency, unspecified: Secondary | ICD-10-CM | POA: Diagnosis not present

## 2023-05-08 DIAGNOSIS — I10 Essential (primary) hypertension: Secondary | ICD-10-CM | POA: Diagnosis not present

## 2023-05-08 DIAGNOSIS — E7800 Pure hypercholesterolemia, unspecified: Secondary | ICD-10-CM | POA: Diagnosis not present

## 2023-05-08 DIAGNOSIS — Z1321 Encounter for screening for nutritional disorder: Secondary | ICD-10-CM | POA: Diagnosis not present

## 2023-05-08 DIAGNOSIS — I25118 Atherosclerotic heart disease of native coronary artery with other forms of angina pectoris: Secondary | ICD-10-CM | POA: Diagnosis not present

## 2023-05-08 DIAGNOSIS — Z131 Encounter for screening for diabetes mellitus: Secondary | ICD-10-CM | POA: Diagnosis not present

## 2023-05-08 DIAGNOSIS — E538 Deficiency of other specified B group vitamins: Secondary | ICD-10-CM | POA: Diagnosis not present

## 2023-05-08 DIAGNOSIS — Z125 Encounter for screening for malignant neoplasm of prostate: Secondary | ICD-10-CM | POA: Diagnosis not present

## 2023-05-26 ENCOUNTER — Ambulatory Visit (INDEPENDENT_AMBULATORY_CARE_PROVIDER_SITE_OTHER): Payer: 59 | Admitting: Podiatry

## 2023-06-10 ENCOUNTER — Encounter (INDEPENDENT_AMBULATORY_CARE_PROVIDER_SITE_OTHER): Payer: 59

## 2023-06-10 ENCOUNTER — Ambulatory Visit (INDEPENDENT_AMBULATORY_CARE_PROVIDER_SITE_OTHER): Payer: 59 | Admitting: Vascular Surgery

## 2023-06-10 ENCOUNTER — Other Ambulatory Visit (INDEPENDENT_AMBULATORY_CARE_PROVIDER_SITE_OTHER): Payer: 59

## 2023-06-23 ENCOUNTER — Ambulatory Visit: Payer: 59 | Admitting: Podiatry

## 2023-07-25 DIAGNOSIS — F17210 Nicotine dependence, cigarettes, uncomplicated: Secondary | ICD-10-CM | POA: Diagnosis not present

## 2023-07-25 DIAGNOSIS — J449 Chronic obstructive pulmonary disease, unspecified: Secondary | ICD-10-CM | POA: Diagnosis not present

## 2023-07-25 DIAGNOSIS — J301 Allergic rhinitis due to pollen: Secondary | ICD-10-CM | POA: Diagnosis not present

## 2023-07-25 DIAGNOSIS — R0609 Other forms of dyspnea: Secondary | ICD-10-CM | POA: Diagnosis not present

## 2023-07-31 DIAGNOSIS — I471 Supraventricular tachycardia, unspecified: Secondary | ICD-10-CM | POA: Diagnosis not present

## 2023-07-31 DIAGNOSIS — Z95828 Presence of other vascular implants and grafts: Secondary | ICD-10-CM | POA: Diagnosis not present

## 2023-07-31 DIAGNOSIS — Z86718 Personal history of other venous thrombosis and embolism: Secondary | ICD-10-CM | POA: Diagnosis not present

## 2023-07-31 DIAGNOSIS — Z9889 Other specified postprocedural states: Secondary | ICD-10-CM | POA: Diagnosis not present

## 2023-07-31 DIAGNOSIS — I2510 Atherosclerotic heart disease of native coronary artery without angina pectoris: Secondary | ICD-10-CM | POA: Diagnosis not present

## 2023-07-31 DIAGNOSIS — I714 Abdominal aortic aneurysm, without rupture, unspecified: Secondary | ICD-10-CM | POA: Diagnosis not present

## 2023-07-31 DIAGNOSIS — I70213 Atherosclerosis of native arteries of extremities with intermittent claudication, bilateral legs: Secondary | ICD-10-CM | POA: Diagnosis not present

## 2023-07-31 DIAGNOSIS — J449 Chronic obstructive pulmonary disease, unspecified: Secondary | ICD-10-CM | POA: Diagnosis not present

## 2023-07-31 DIAGNOSIS — L089 Local infection of the skin and subcutaneous tissue, unspecified: Secondary | ICD-10-CM | POA: Diagnosis not present

## 2023-07-31 DIAGNOSIS — I10 Essential (primary) hypertension: Secondary | ICD-10-CM | POA: Diagnosis not present

## 2023-07-31 DIAGNOSIS — Z720 Tobacco use: Secondary | ICD-10-CM | POA: Diagnosis not present

## 2023-07-31 DIAGNOSIS — R600 Localized edema: Secondary | ICD-10-CM | POA: Diagnosis not present

## 2023-08-04 ENCOUNTER — Encounter: Payer: Self-pay | Admitting: Podiatry

## 2023-08-04 ENCOUNTER — Ambulatory Visit: Payer: 59 | Admitting: Podiatry

## 2023-08-04 DIAGNOSIS — D2370 Other benign neoplasm of skin of unspecified lower limb, including hip: Secondary | ICD-10-CM

## 2023-08-04 NOTE — Progress Notes (Signed)
  Subjective:  Patient ID: Scott [name], male    DOB: Mar 05, 1961,  MRN: [id number]  Chief Complaint  Patient presents with   Plantar Warts    "It's not good.  My doctor said I have Cellulitis and has put me on an antibiotic, Cephalexin 500 mg."    Discussed the use of AI scribe software for clinical note transcription with the patient, who gave verbal consent to proceed.  History of Present Illness   He returns for follow-up has follow-up testing with vascular surgery this week.  Was not able to return due to insurance issue but this has been resolved.  They have been painful.  His cardiologist thought he had cellulitis of the left leg and put him on Keflex..         Objective:    Physical Exam   EXTREMITIES: Left foot warm, right foot cooler. Palpable dorsalis pedis (DP) pulse on the left, weakly palpable pedal pulse (PP) on the left, and weekly palpable dorsalis pedalis (DP) pulse on the right. Capillary refill time intact. SKIN: Benign appearing skin lesions on the left foot, submetatarsal 3 and 4 on the left foot, and 4 on the right foot.  No active cellulitis, erythema appears to be more consistent with a dependent rubor      No images are attached to the encounter.    Results   Procedure: Debridement of skin lesions Description: Lesions were debrided with a sharp scalpel to expose the lesion and enucleate the core. Salinocaine ointment was applied.      Assessment:   Encounter Diagnosis  Name Primary?   Benign neoplasm of skin of lower extremity, unspecified laterality Yes       Plan:  Patient was evaluated and treated and all questions answered.  Assessment and Plan    Okay to complete Keflex, likely wincing today is more dependent rubor than a true cellulitis.  He has no open lesions.  Plantar Warts   Benign skin lesions were debrided to a nucleated central core and expose the deepest portions of the lesion.  Salinocaine salicylic acid treatment was  applied today.  He will leave the dressing on for 24 hours and then remove and wash with soap and water.  Return in 1 month for retreatment.

## 2023-08-06 ENCOUNTER — Ambulatory Visit (INDEPENDENT_AMBULATORY_CARE_PROVIDER_SITE_OTHER): Payer: 59

## 2023-08-06 ENCOUNTER — Ambulatory Visit (INDEPENDENT_AMBULATORY_CARE_PROVIDER_SITE_OTHER): Payer: 59 | Admitting: Nurse Practitioner

## 2023-08-06 ENCOUNTER — Encounter (INDEPENDENT_AMBULATORY_CARE_PROVIDER_SITE_OTHER): Payer: Self-pay | Admitting: Nurse Practitioner

## 2023-08-06 VITALS — BP 149/93 | HR 61 | Resp 18 | Ht 71.0 in | Wt 169.6 lb

## 2023-08-06 DIAGNOSIS — I10 Essential (primary) hypertension: Secondary | ICD-10-CM | POA: Diagnosis not present

## 2023-08-06 DIAGNOSIS — I7143 Infrarenal abdominal aortic aneurysm, without rupture: Secondary | ICD-10-CM

## 2023-08-06 DIAGNOSIS — I739 Peripheral vascular disease, unspecified: Secondary | ICD-10-CM | POA: Diagnosis not present

## 2023-08-06 DIAGNOSIS — F17210 Nicotine dependence, cigarettes, uncomplicated: Secondary | ICD-10-CM

## 2023-08-06 DIAGNOSIS — Z9889 Other specified postprocedural states: Secondary | ICD-10-CM

## 2023-08-06 DIAGNOSIS — I70229 Atherosclerosis of native arteries of extremities with rest pain, unspecified extremity: Secondary | ICD-10-CM | POA: Diagnosis not present

## 2023-08-06 NOTE — Progress Notes (Signed)
 Subjective:    Patient ID: Scott [name], male    DOB: 1961-02-14, 63 y.o.   MRN: [id number] Chief Complaint  Patient presents with   Follow-up    6 month follow up with ABI & AAA    Scott [name] is a 63 year old male who returns today for follow-up regarding his peripheral arterial disease as well as abdominal aortic aneurysm.  He still continues to have swelling and numbness and tingling in his left lower extremity, which I suspect are more neuropathic in nature from his previous ischemic events.  He recently had some plantar wart shaved from his foot and I also suspect this is probably exacerbated this somewhat but he has no evidence of cellulitis noted today.  He does endorse having some claudication-like symptoms in his right lower extremity but no signs symptoms of distal embolization or rest pain.  He denies any abdominal pain as well.  Today he has an ABI of 0.66 on the right and 1.00 on the left.  His previous ABI was 0.75 on the right and 1.03 on the left.  He has monophasic tibial waveforms on the right and triphasic on the left.  His abdominal aortic aneurysm has increased from 4.4 cm to 4.8 cm from his last study on 11/26/2022.    Review of Systems  Cardiovascular:  Positive for leg swelling.  Skin:  Positive for wound.  All other systems reviewed and are negative.      Objective:   Physical Exam Vitals reviewed.  HENT:     Head: Normocephalic.  Cardiovascular:     Rate and Rhythm: Normal rate.     Pulses:          Dorsalis pedis pulses are detected w/ Doppler on the right side and detected w/ Doppler on the left side.       Posterior tibial pulses are detected w/ Doppler on the right side and detected w/ Doppler on the left side.  Pulmonary:     Effort: Pulmonary effort is normal.  Musculoskeletal:     Left lower leg: 1+ Edema present.  Skin:    General: Skin is warm and dry.  Neurological:     Mental Status: He is alert and oriented to person, place, and time.   Psychiatric:        Mood and Affect: Mood normal.        Behavior: Behavior normal.        Thought Content: Thought content normal.        Judgment: Judgment normal.     BP (!) 149/93   Pulse 61   Resp 18   Ht 5\' 11"  (1.803 m)   Wt 169 lb 9.6 oz (76.9 kg)   BMI 23.65 kg/m   Past Medical History:  Diagnosis Date   AAA (abdominal aortic aneurysm) without rupture (HCC) 02/23/2020   a.) AAA duplex 02/23/2020: 3.8 cm; b.) AAA duplex 11/28/2020: 3.7 cm; c.) CTA AO + BIFEM 12/18/2020: 3.8 x 3.4 cm; d.) AAA duplex 11/07/2021: 3.7 cm; e.) CTA AO + BIFEM 11/16/2021: 4.2 cm; f.) AAA duplex 05/28/2022: 4.0 cm; g.) AAA duplex 11/26/2022: 4.4 cm   Alcoholism (HCC)    Anginal pain (HCC)    Anxiety    Aortic atherosclerosis (HCC)    Arthritis    Bilateral carotid artery disease (HCC) 06/20/2020   a.) carotid doppler 06/20/2020: 1-39% RICA, 40-59% LICA; b.) carotid doppler 11/28/2020, 11/26/2022: 1-39% BICA   Bilateral inguinal hernia    CAD (coronary artery  disease) 06/03/2017   a.) LHC 06/03/2017: 25% pLAD - med mgmt; b.) LHC 08/22/2021: 25% pLAD, 15% o-mLM, 25% dLM-pLAD, 25% o-mLCx, 25% oD2 - med mgmt   COPD (chronic obstructive pulmonary disease) (HCC)    Coronary artery disease    COVID-19 2022   Deep vein thrombosis (DVT) of left lower extremity (HCC)    Depression    Dilatation of thoracic aorta (HCC) 09/17/2022   a.) CT chest 09/17/2022: 4.0 cm; b.) CT chest 01/02/2023: 3.9 cm   Diverticulosis    Dupuytren contracture of both hands    Dyspnea    GERD (gastroesophageal reflux disease)    Hepatic steatosis    HLD (hyperlipidemia)    Hypertension    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   a.) PET CT 10/01/2022: 6 mm with SUV max of 6.4; b.) s.p FNA 11/05/2022 --> pathology negative for malignancy (DDx oncocytosis vs oncocytoma)   On chronic clopidogrel therapy    PAOD (peripheral arterial occlusive disease) (HCC)    a.) s/p BILATERAL endarterectomies 12/29/2020   Pre-diabetes     SVT (supraventricular tachycardia) (HCC)    a.) s/p ablation 02/28/2016    Social History   Socioeconomic History   Marital status: Divorced    Spouse name: Not on file   Number of children: Not on file   Years of education: Not on file   Highest education level: Not on file  Occupational History   Not on file  Tobacco Use   Smoking status: Every Day    Current packs/day: 0.75    Types: Cigarettes   Smokeless tobacco: Never  Vaping Use   Vaping status: Never Used  Substance and Sexual Activity   Alcohol use: Yes    Comment: 3-4 beers per day   Drug use: No   Sexual activity: Not on file  Other Topics Concern   Not on file  Social History Narrative   Lives with sister   Social Drivers of Health   Financial Resource Strain: Low Risk  (02/28/2023)   Received from [facility]   Overall Financial Resource Strain (CARDIA)    Difficulty of Paying Living Expenses: Not hard at all  Food Insecurity: No Food Insecurity (02/28/2023)   Received from [facility]   Hunger Vital Sign    Ran Out of Food in the Last Year: Never true    Worried About Running Out of Food in the Last Year: Never true  Transportation Needs: No Transportation Needs (02/28/2023)   Received from [facility]   PRAPARE - Transportation    Lack of Transportation (Non-Medical): No    In the past 12 months, has lack of transportation kept you from medical appointments or from getting medications?: No  Physical Activity: Not on file  Stress: Not on file  Social Connections: Not on file  Intimate Partner Violence: Not on file    Past Surgical History:  Procedure Laterality Date   ANKLE SURGERY     "bracelet"   CHOLECYSTECTOMY  2000   COLONOSCOPY     ENDARTERECTOMY FEMORAL Bilateral 12/29/2020   Procedure: ENDARTERECTOMY FEMORAL;  Surgeon: Annice Needy, MD;  Location: [facility] ORS;  Service: Vascular;  Laterality: Bilateral;  Left SFA and Tibial  intervention   INSERTION OF MESH Bilateral 02/12/2023   Procedure: INSERTION OF MESH;  Surgeon: Carolan Shiver, MD;  Location: [facility] OR[facility];  Service: General;  Laterality: Bilateral;   LEFT HEART CATH AND CORONARY ANGIOGRAPHY Left 06/03/2017  Procedure: LEFT HEART CATH AND CORONARY ANGIOGRAPHY;  Surgeon: Antonette Batters, MD;  Location: [facility] IN[facility];  Service: Cardiovascular;  Laterality: Left;   LEFT HEART CATH AND CORONARY ANGIOGRAPHY N/A 08/22/2021   Procedure: LEFT HEART CATH AND CORONARY ANGIOGRAPHY;  Surgeon: Antonette Batters, MD;  Location: [facility];  Service: Cardiovascular;  Laterality: N/A;   LOWER EXTREMITY ANGIOGRAPHY Left 02/03/2020   Procedure: LOWER EXTREMITY ANGIOGRAPHY;  Surgeon: Celso College, MD;  Location: [facility] CV [facility];  Service: Cardiovascular;  Laterality: Left;   SVT ABLATION N/A 02/28/2016   TONSILLECTOMY      Family History  Problem Relation Age of Onset   Diabetes Mother    Hypertension Mother    Lupus Mother    Heart attack Father    Cancer Brother    Cancer Brother     Allergies  Allergen Reactions   Acetaminophen-Codeine Other (See Comments)    Nausea and "hot flash"    Chantix [Varenicline] Nausea Only    Sour taste in mouth   Codeine Nausea Only   Gabapentin Swelling   Pregabalin Swelling       Latest Ref Rng & Units 02/12/2023   10:10 AM 02/05/2023    9:54 AM 01/07/2021    9:28 AM  CBC  WBC 4.0 - 10.5 K/uL  4.9  10.1   Hemoglobin 13.0 - 17.0 g/dL 16.1  09.6  04.5   Hematocrit 39.0 - 52.0 % 44.0  43.5  32.5   Platelets 150 - 400 K/uL  188  401       CMP     Component Value Date/Time   NA 129 (L) 02/12/2023 1010   K 4.0 02/12/2023 1010   CL 90 (L) 02/12/2023 1010   CO2 31 02/05/2023 0954   GLUCOSE 124 (H) 02/12/2023 1010   BUN 7 (L) 02/12/2023 1010   CREATININE 0.70 02/12/2023 1010   CALCIUM 9.1 02/05/2023 0954   PROT 6.7 01/07/2021 0928   ALBUMIN 3.5 01/07/2021 0928   AST 27 01/07/2021 0928    ALT 27 01/07/2021 0928   ALKPHOS 78 01/07/2021 0928   BILITOT 1.0 01/07/2021 0928   GFRNONAA >60 02/05/2023 0954     No results found.     Assessment & Plan:   1. Infrarenal abdominal aortic aneurysm (AAA) without rupture (HCC) (Primary) Today the patient's abdominal aortic aneurysm has increased from 4.  4 cm to 4.8 cm.  This is just shy of 0.5 cm of rapid growth.  This is actually consistent with his previous studies where he also showed growth of 0.4 cm in 6 months.  We discussed that again 5 cm is typically our threshold for repair and given his rate of growth we may be discussing repair at his follow-up visit.  2. Primary hypertension Continue antihypertensive medications as already ordered, these medications have been reviewed and there are no changes at this time.  3. Peripheral arterial disease with history of revascularization (HCC) [name] left lower extremity is doing well however he does have decreased ABIs noted on his right.  At this time he does not have any significant issues but he did recently have plantar warts removed from the bottom of his feet.  I have advised him that if these are slow healing to contact us  as he may require an angiogram.  In addition we likely will need to have the patient's right lower extremity repair prior to any endovascular intervention for his abdominal aortic aneurysm.  4. Cigarette smoker Smoking cessation was discussed, 3-10 minutes spent on this topic specifically   Current Outpatient Medications on File Prior to Visit  Medication Sig Dispense Refill   albuterol (VENTOLIN HFA) 108 (90 Base) MCG/ACT inhaler Inhale 2 puffs into the lungs every 6 (six) hours as needed.     amLODipine (NORVASC) 10 MG tablet Take 10 mg by mouth daily.     apixaban (ELIQUIS) 5 MG TABS tablet Take 5 mg by mouth 2 (two) times daily.     atorvastatin (LIPITOR) 40 MG tablet Take 40 mg by mouth daily.     budesonide-formoterol (SYMBICORT) 160-4.5 MCG/ACT  inhaler Inhale 2 puffs into the lungs.     buPROPion (WELLBUTRIN XL) 150 MG 24 hr tablet Take 150 mg by mouth daily.     cephALEXin (KEFLEX) 500 MG capsule Take 500 mg by mouth 3 (three) times daily.     cholecalciferol (VITAMIN D3) 25 MCG (1000 UNIT) tablet Take 1,000 Units by mouth daily.     cloNIDine (CATAPRES) 0.1 MG tablet Take 0.2 mg by mouth 2 (two) times daily.     clopidogrel (PLAVIX) 75 MG tablet Take 75 mg by mouth daily.     famotidine (PEPCID) 20 MG tablet Take 20 mg by mouth 2 (two) times daily as needed for heartburn or indigestion.     furosemide (LASIX) 20 MG tablet Take 20 mg by mouth daily.     guaiFENesin (MUCINEX) 600 MG 12 hr tablet Take 600 mg by mouth 2 (two) times daily.     ipratropium-albuterol (DUONEB) 0.5-2.5 (3) MG/3ML SOLN Inhale 3 mLs into the lungs every 6 (six) hours as needed.     isosorbide mononitrate (IMDUR) 30 MG 24 hr tablet Take 30 mg by mouth daily as needed.  5   lisinopril-hydrochlorothiazide (ZESTORETIC) 20-12.5 MG tablet Take 1 tablet by mouth 2 (two) times daily.     metoprolol succinate (TOPROL-XL) 100 MG 24 hr tablet Take 100 mg by mouth daily. Take with or immediately following a meal.     potassium chloride SA (KLOR-CON M) 20 MEQ tablet Take 2 tablets (40 mEq) today, then take 1 tablet (20 mEq) daily until surgery. Be sure to take dose on day of procedure. Follow up with PCP for repeat labs. 9 tablet 0   vitamin B-12 (CYANOCOBALAMIN) 500 MCG tablet Take 500 mcg by mouth daily.     aspirin EC 81 MG tablet Take 162 mg by mouth daily. Swallow whole. (Patient not taking: Reported on 04/28/2023)     No current facility-administered medications on file prior to visit.    There are no Patient Instructions on file for this visit. No follow-ups on file.   Yasmen Cortner E Rudine Rieger, NP

## 2023-08-11 LAB — VAS US ABI WITH/WO TBI
Left ABI: 1
Right ABI: 0.66

## 2023-09-03 ENCOUNTER — Ambulatory Visit: Admitting: Podiatry

## 2023-09-09 ENCOUNTER — Encounter (INDEPENDENT_AMBULATORY_CARE_PROVIDER_SITE_OTHER): Payer: Self-pay

## 2023-09-24 ENCOUNTER — Ambulatory Visit: Admitting: Podiatry

## 2023-10-07 DIAGNOSIS — Z86718 Personal history of other venous thrombosis and embolism: Secondary | ICD-10-CM | POA: Diagnosis not present

## 2023-10-07 DIAGNOSIS — I471 Supraventricular tachycardia, unspecified: Secondary | ICD-10-CM | POA: Diagnosis not present

## 2023-10-07 DIAGNOSIS — Z8261 Family history of arthritis: Secondary | ICD-10-CM | POA: Diagnosis not present

## 2023-10-07 DIAGNOSIS — I10 Essential (primary) hypertension: Secondary | ICD-10-CM | POA: Diagnosis not present

## 2023-10-07 DIAGNOSIS — Z1331 Encounter for screening for depression: Secondary | ICD-10-CM | POA: Diagnosis not present

## 2023-10-07 DIAGNOSIS — F17200 Nicotine dependence, unspecified, uncomplicated: Secondary | ICD-10-CM | POA: Diagnosis not present

## 2023-10-07 DIAGNOSIS — Z09 Encounter for follow-up examination after completed treatment for conditions other than malignant neoplasm: Secondary | ICD-10-CM | POA: Diagnosis not present

## 2023-10-07 DIAGNOSIS — I70229 Atherosclerosis of native arteries of extremities with rest pain, unspecified extremity: Secondary | ICD-10-CM | POA: Diagnosis not present

## 2023-10-07 DIAGNOSIS — G441 Vascular headache, not elsewhere classified: Secondary | ICD-10-CM | POA: Diagnosis not present

## 2023-10-07 DIAGNOSIS — I25118 Atherosclerotic heart disease of native coronary artery with other forms of angina pectoris: Secondary | ICD-10-CM | POA: Diagnosis not present

## 2023-10-07 DIAGNOSIS — R079 Chest pain, unspecified: Secondary | ICD-10-CM | POA: Diagnosis not present

## 2023-10-07 DIAGNOSIS — M129 Arthropathy, unspecified: Secondary | ICD-10-CM | POA: Diagnosis not present

## 2023-10-08 DIAGNOSIS — K219 Gastro-esophageal reflux disease without esophagitis: Secondary | ICD-10-CM | POA: Diagnosis not present

## 2023-10-08 DIAGNOSIS — I471 Supraventricular tachycardia, unspecified: Secondary | ICD-10-CM | POA: Diagnosis not present

## 2023-10-08 DIAGNOSIS — Z8249 Family history of ischemic heart disease and other diseases of the circulatory system: Secondary | ICD-10-CM | POA: Diagnosis not present

## 2023-10-08 DIAGNOSIS — I2510 Atherosclerotic heart disease of native coronary artery without angina pectoris: Secondary | ICD-10-CM | POA: Diagnosis not present

## 2023-10-08 DIAGNOSIS — Z823 Family history of stroke: Secondary | ICD-10-CM | POA: Diagnosis not present

## 2023-10-08 DIAGNOSIS — G629 Polyneuropathy, unspecified: Secondary | ICD-10-CM | POA: Diagnosis not present

## 2023-10-08 DIAGNOSIS — I70223 Atherosclerosis of native arteries of extremities with rest pain, bilateral legs: Secondary | ICD-10-CM | POA: Diagnosis not present

## 2023-10-08 DIAGNOSIS — Z833 Family history of diabetes mellitus: Secondary | ICD-10-CM | POA: Diagnosis not present

## 2023-10-08 DIAGNOSIS — E785 Hyperlipidemia, unspecified: Secondary | ICD-10-CM | POA: Diagnosis not present

## 2023-10-08 DIAGNOSIS — I719 Aortic aneurysm of unspecified site, without rupture: Secondary | ICD-10-CM | POA: Diagnosis not present

## 2023-10-08 DIAGNOSIS — Z809 Family history of malignant neoplasm, unspecified: Secondary | ICD-10-CM | POA: Diagnosis not present

## 2023-10-08 DIAGNOSIS — Z9181 History of falling: Secondary | ICD-10-CM | POA: Diagnosis not present

## 2023-10-27 ENCOUNTER — Ambulatory Visit: Admitting: Podiatry

## 2023-10-27 ENCOUNTER — Encounter: Payer: Self-pay | Admitting: Podiatry

## 2023-10-27 VITALS — Ht 71.0 in | Wt 169.6 lb

## 2023-10-27 DIAGNOSIS — D2370 Other benign neoplasm of skin of unspecified lower limb, including hip: Secondary | ICD-10-CM | POA: Diagnosis not present

## 2023-10-27 NOTE — Progress Notes (Signed)
  Subjective:  Patient ID: Scott [name], male    DOB: April 21, 1961,  MRN: [id number]  Chief Complaint  Patient presents with   Plantar Warts    Pt is here to f/u on bilateral plantar warts on the bottom of his feet.    Discussed the use of AI scribe software for clinical note transcription with the patient, who gave verbal consent to proceed.  History of Present Illness   Returns for follow-up lesions have returned and become quite painful.  Has angiography scheduled for the fall      Objective:    Physical Exam   EXTREMITIES: Left foot warm, right foot cooler. Palpable dorsalis pedis (DP) pulse on the left, weakly palpable pedal pulse (PP) on the left, and weekly palpable dorsalis pedalis (DP) pulse on the right. Capillary refill time intact. SKIN: Benign appearing skin lesions on the left foot, submetatarsal 3 and 4 on the left foot, and 4 on the right foot, has a new 1 on the plantar heel as well     Results   Procedure: Debridement of skin lesions Description: Lesions were debrided with a sharp scalpel to expose the lesion and enucleate the core. Salinocaine ointment was applied to destroy lesions.      Assessment:   Encounter Diagnosis  Name Primary?   Benign neoplasm of skin of lower extremity, unspecified laterality Yes       Plan:  Patient was evaluated and treated and all questions answered.  Assessment and Plan       Plantar Warts   Benign skin lesions were debrided to a nucleated central core and expose the deepest portions of the lesion.  Salinocaine salicylic acid treatment was applied today.  He will leave the dressing on for 24 hours and then remove and wash with soap and water .  Return in 6 to 10 weeks for retreatment depending on how quickly they return

## 2023-10-30 ENCOUNTER — Ambulatory Visit

## 2023-10-30 VITALS — BP 160/94 | HR 98 | Ht 71.0 in | Wt 156.4 lb

## 2023-10-30 DIAGNOSIS — I471 Supraventricular tachycardia, unspecified: Secondary | ICD-10-CM | POA: Diagnosis not present

## 2023-10-30 DIAGNOSIS — Z789 Other specified health status: Secondary | ICD-10-CM

## 2023-10-30 DIAGNOSIS — I2510 Atherosclerotic heart disease of native coronary artery without angina pectoris: Secondary | ICD-10-CM | POA: Diagnosis not present

## 2023-10-30 NOTE — Progress Notes (Signed)
 New Patient Visit   Patient: Scott [name]   DOB: [birth year]   63 y.o. Male  MRN: [id number] Visit Date: 10/30/2023  Today's healthcare provider: Rhayne Chatwin A Clois Montavon, MD   Chief Complaint  Patient presents with   Establish Care   Subjective    Scott [name] is a 63 y.o. male who presents today as a new patient to establish care.   HPI   Patient seen to establish care.  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  He is a current smoker still smoking about half a pack a day.  He does see pulmonology.  Currently he has been to been which he uses on an as-needed basis.  He is on Symbicort 160/4.5 which he uses daily.  He would like to quit smoking but is finding difficulty getting adequate coverage for Chantix.  If he had tried Wellbutrin in the past with no success.  Patient has pulmonary nodule which is RADS 4 BS which is being followed by pulmonology as well.  He will be due for repeat CT in the next few months.  PET scan showing also hypermetabolic 6 mm left parotid nodule which was reactive and is being followed.  Patient has a history of alcohol  abuse.  Past imaging showing no steatotic enlarged liver.  He currently drinks 5-6 beers daily and is unable to quit drinking at this point.  Patient has a history of an aortic aneurysm which is currently at 4.3 cm.  He has established with vascular and will have follow-up imaging on a regular basis.  Patient has coronary artery disease with significant coronary artery calcification and atherosclerosis.  He is currently on daily aspirin .  Patient denies any current chest pain.  He is quite sedentary so cannot say that he has any shortness of breath.  Patient with mild diastolic dysfunction on echo from 2022 showing an EF of 45 to 50%.  Trivial regurgitation.  He had cardiac catheterization in 2023 showing 15 to 25% stenosis of most vessels.  Patient was started on Lasix for peripheral edema but it does not seem that he has  history of congestive heart failure.  He currently takes 20 mg daily.  Peripheral vascular disease with aortobifemoral bypass surgery in the past.  Patient has a history of supraventricular tachycardia with history of ablation.  Patient currently on metoprolol  XL 100 mg daily This is stable.  Hypertension treated with amlodipine  10 mg daily, lisinopril /hydrochlorothiazide  20/12.5 mg daily, metoprolol  100 mg daily.  Blood pressure at home in the 130 systolic.  Blood pressure today elevated in office currently at 160/94.  He reports the blood pressure is generally lower at home.  He also has clonidine  0.1 mg tablets which he takes on a as needed basis.  History of GERD for which he takes Pepcid  20 mg daily.  Patient does supplement with B12 500 mcg daily.  He reports his diet is poor.  He does have difficulties with mobility due to progressive peripheral vascular disease and overall sedentary lifestyle.  Significant pain in his hands and feet due to progressive osteoarthritis.  He is finding that this is interfering with mobility.  He does have noted deformity.  Rheumatology appointment pending.  Assessment & Plan   COPD/Emphysema  He will continue to see pulmonology and will continue with use of Symbicort and as needed Ventolin .  We discussed smoking cessation as the most urgent need.  He does live with a smoker and this makes it more difficult for  him to quit.  Will try and obtain further smoking cessation support for him when he is ready to quit. CAD - Patient on Lipitor 40 mg daily.  Continue with daily aspirin .  Have you remain on clopidogrel  75 mg.  He is extremely high risk from a coronary artery disease point due to alcohol  use and continued smoking.  He has as needed isosorbide  mononitrate  Peripheral vascular disease.  He is unable to remain on Eliquis  due to cost and still remain on Plavix  and daily aspirin .  Continue with atorvastatin  40 mg daily.   Hypertension.  Did give him blood  pressure log to keep at home.  Ideally he should not have to use as needed clonidine  if baseline medication is adequate.  Will follow-up with him in 3 to 4 weeks.  Follow-up sooner if blood pressure remains high at home   GERD. Patient to continue with famotidine  20 mg daily. Alcohol  abuse Patient is currently at 8 beers daily would like him to try and reduce to 5.  He does likely have some cirrhosis of the liver given imaging findings. 7.  Pulmonary nodule.  This will be followed with pulmonology.  Will follow-up with patient imaging results with she will report when he has recurrent CT.  Will obtain basic labs for this patient.  Overall close to help with cessation of alcohol  intake and smoking cessation.  Will try and obtain outside supportive services where possible  He may try Voltaren gel on his hands just for symptomatic relief.   Objective    BP (!) 160/94 (BP Location: Left Arm, Patient Position: Sitting, Cuff Size: Normal)   Pulse 98   Ht 5' 11 (1.803 m)   Wt 156 lb 6 oz (70.9 kg)   SpO2 99%   BMI 21.81 kg/m      Review of Systems  Constitutional:  Negative for chills, fever and weight loss.  Eyes:  Negative for blurred vision.  Respiratory:  Negative for cough and shortness of breath.   Cardiovascular:  Negative for chest pain and palpitations.  Skin:  Negative for rash.  Psychiatric/Behavioral:  Negative for depression. The patient is not nervous/anxious.    Physical Exam Physical Exam Vitals reviewed.  Constitutional:      Appearance: Normal appearance. Well-developed with normal weight.  Appears chronically ill HENT:     Head: Normocephalic and atraumatic.  Normal mucous membranes, no oral lesions Eyes:     Pupils: Pupils are equal, round, and reactive to light.  Neck:     Thyroid: No thyroid mass or thyromegaly.  Cardiovascular:     Rate and Rhythm: Normal rate and regular rhythm. Normal heart sounds. Normal peripheral pulses Pulmonary:     Normal breath  sounds with normal effort Abdominal:   Abdomen is soft, without tenderness or noted hepatosplenomegaly Musculoskeletal:        General: No swelling or edema.  Noted DIP deformity hands Neck    Cervical: No cervical adenopathy.  Skin:    General: Skin is warm and dry without noticeable rash.  Ecchymosis noted Neurological:     General: No focal deficit present.  Psychiatric:        Mood and Affect: Mood, behavior and cognition normal   No follow-ups on file.  Past Medical History:  Diagnosis Date   AAA (abdominal aortic aneurysm) without rupture (HCC) 02/23/2020   a.) AAA duplex 02/23/2020: 3.8 cm; b.) AAA duplex 11/28/2020: 3.7 cm; c.) CTA AO + BIFEM 12/18/2020: 3.8 x 3.4 cm;  d.) AAA duplex 11/07/2021: 3.7 cm; e.) CTA AO + BIFEM 11/16/2021: 4.2 cm; f.) AAA duplex 05/28/2022: 4.0 cm; g.) AAA duplex 11/26/2022: 4.4 cm   Alcoholism (HCC)    Anginal pain (HCC)    Anxiety    Aortic atherosclerosis (HCC)    Arthritis    Bilateral carotid artery disease (HCC) 06/20/2020   a.) carotid doppler 06/20/2020: 1-39% RICA, 40-59% LICA; b.) carotid doppler 11/28/2020, 11/26/2022: 1-39% BICA   Bilateral inguinal hernia    CAD (coronary artery disease) 06/03/2017   a.) LHC 06/03/2017: 25% pLAD - med mgmt; b.) LHC 08/22/2021: 25% pLAD, 15% o-mLM, 25% dLM-pLAD, 25% o-mLCx, 25% oD2 - med mgmt   COPD (chronic obstructive pulmonary disease) (HCC)    Coronary artery disease    COVID-19 2022   Deep vein thrombosis (DVT) of left lower extremity (HCC)    Depression    Dilatation of thoracic aorta (HCC) 09/17/2022   a.) CT chest 09/17/2022: 4.0 cm; b.) CT chest 01/02/2023: 3.9 cm   Diverticulosis    Dupuytren contracture of both hands    Dyspnea    GERD (gastroesophageal reflux disease)    Hepatic steatosis    HLD (hyperlipidemia)    Hypertension    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   a.) PET CT 10/01/2022: 6 mm with SUV max of 6.4; b.) s.p FNA 11/05/2022 --> pathology negative for malignancy  (DDx oncocytosis vs oncocytoma)   On chronic clopidogrel  therapy    PAOD (peripheral arterial occlusive disease) (HCC)    a.) s/p BILATERAL endarterectomies 12/29/2020   Pre-diabetes    SVT (supraventricular tachycardia) (HCC)    a.) s/p ablation 02/28/2016   Past Surgical History:  Procedure Laterality Date   ANKLE SURGERY     bracelet   CHOLECYSTECTOMY  2000   COLONOSCOPY     ENDARTERECTOMY FEMORAL Bilateral 12/29/2020   Procedure: ENDARTERECTOMY FEMORAL;  Surgeon: Marea Selinda RAMAN, MD;  Location: [facility];  Service: Vascular;  Laterality: Bilateral;  Left SFA and Tibial intervention   INSERTION OF MESH Bilateral 02/12/2023   Procedure: INSERTION OF MESH;  Surgeon: Rodolph Romano, MD;  Location: [facility];  Service: General;  Laterality: Bilateral;   LEFT HEART CATH AND CORONARY ANGIOGRAPHY Left 06/03/2017   Procedure: LEFT HEART CATH AND CORONARY ANGIOGRAPHY;  Surgeon: Florencio Cara BIRCH, MD;  Location: [facility];  Service: Cardiovascular;  Laterality: Left;   LEFT HEART CATH AND CORONARY ANGIOGRAPHY N/A 08/22/2021   Procedure: LEFT HEART CATH AND CORONARY ANGIOGRAPHY;  Surgeon: Florencio Cara BIRCH, MD;  Location: [facility];  Service: Cardiovascular;  Laterality: N/A;   LOWER EXTREMITY ANGIOGRAPHY Left 02/03/2020   Procedure: LOWER EXTREMITY ANGIOGRAPHY;  Surgeon: Marea Selinda RAMAN, MD;  Location: [facility];  Service: Cardiovascular;  Laterality: Left;   SVT ABLATION N/A 02/28/2016   TONSILLECTOMY     Family Status  Relation Name Status   Mother  Deceased   Father  Deceased   Brother  Deceased   Brother  Deceased  No partnership data on file   Family History  Problem Relation Age of Onset   Diabetes Mother    Hypertension Mother    Lupus Mother    Heart attack Father    Cancer Brother    Cancer Brother    Social History   Socioeconomic History   Marital status: Divorced    Spouse name: Not on file   Number of children: Not on file    Years of education: Not  on file   Highest education level: Not on file  Occupational History   Not on file  Tobacco Use   Smoking status: Every Day    Current packs/day: 0.75    Types: Cigarettes   Smokeless tobacco: Never  Vaping Use   Vaping status: Never Used  Substance and Sexual Activity   Alcohol  use: Yes    Comment: 3-4 beers per day   Drug use: No   Sexual activity: Not on file  Other Topics Concern   Not on file  Social History Narrative   Lives with sister   Social Drivers of Health   Financial Resource Strain: High Risk (10/07/2023)   [facility]   Overall Financial Resource Strain (CARDIA)    Difficulty of Paying Living Expenses: Very hard  Food Insecurity: No Food Insecurity (10/07/2023)   Received from [facility]   Hunger Vital Sign    Within the past 12 months, you worried that your food would run out before you got the money to buy more.: Never true    Within the past 12 months, the food you bought just didn't last and you didn't have money to get more.: Never true  Transportation Needs: Unmet Transportation Needs (10/07/2023)   Received from [facility] - Transportation    In the past 12 months, has lack of transportation kept you from medical appointments or from getting medications?: Yes    Lack of Transportation (Non-Medical): Yes  Physical Activity: Inactive (10/07/2023)   Received from [facility]   Exercise Vital Sign    On average, how many days per week do you engage in moderate to strenuous exercise (like a brisk walk)?: 0 days    On average, how many minutes do you engage in exercise at this level?: 0 min  Stress: Stress Concern Present (10/07/2023)   Received from [facility] - Occupational Stress Questionnaire    Feeling of Stress : Very much  Social Connections: Socially Isolated (10/07/2023)    Received from [facility]   Social Connection and Isolation Panel    In a typical week, how many times do you talk on the phone with family, friends, or neighbors?: Twice a week    How often do you get together with friends or relatives?: Twice a week    How often do you attend church or religious services?: Never    Do you belong to any clubs or organizations such as church groups, unions, fraternal or athletic groups, or school groups?: No    How often do you attend meetings of the clubs or organizations you belong to?: Never    Are you married, widowed, divorced, separated, never married, or living with a partner?: Divorced   Outpatient Medications Prior to Visit  Medication Sig   albuterol  (VENTOLIN  HFA) 108 (90 Base) MCG/ACT inhaler Inhale 2 puffs into the lungs every 6 (six) hours as needed.   amLODipine  (NORVASC ) 10 MG tablet Take 10 mg by mouth daily.   atorvastatin  (LIPITOR) 40 MG tablet Take 40 mg by mouth daily.   budesonide-formoterol  (SYMBICORT) 160-4.5 MCG/ACT inhaler Inhale 2 puffs into the lungs.   cholecalciferol (VITAMIN D3) 25 MCG (1000 UNIT) tablet Take 1,000 Units by mouth daily.   cloNIDine  (CATAPRES ) 0.1 MG tablet Take 0.2 mg by mouth 2 (two) times daily.   clopidogrel  (PLAVIX ) 75 MG tablet Take 75  mg by mouth daily.   famotidine  (PEPCID ) 20 MG tablet Take 20 mg by mouth 2 (two) times daily as needed for heartburn or indigestion.   furosemide (LASIX) 20 MG tablet Take 20 mg by mouth daily.   guaiFENesin  (MUCINEX ) 600 MG 12 hr tablet Take 600 mg by mouth 2 (two) times daily.   ipratropium-albuterol  (DUONEB) 0.5-2.5 (3) MG/3ML SOLN Inhale 3 mLs into the lungs every 6 (six) hours as needed.   isosorbide  mononitrate (IMDUR ) 30 MG 24 hr tablet Take 30 mg by mouth daily as needed.   lisinopril -hydrochlorothiazide  (ZESTORETIC ) 20-12.5 MG tablet Take 1 tablet by mouth 2 (two) times daily.   metoprolol  succinate (TOPROL -XL) 100 MG 24 hr tablet Take 100 mg by  mouth daily. Take with or immediately following a meal.   nortriptyline (PAMELOR) 25 MG capsule Take 25 mg by mouth at bedtime.   potassium chloride  SA (KLOR-CON  M) 20 MEQ tablet Take 2 tablets (40 mEq) today, then take 1 tablet (20 mEq) daily until surgery. Be sure to take dose on day of procedure. Follow up with PCP for repeat labs.   propranolol (INDERAL) 10 MG tablet Take 10 mg by mouth 3 (three) times daily as needed.   rizatriptan (MAXALT-MLT) 10 MG disintegrating tablet Take 10 mg by mouth as needed.   traMADol  (ULTRAM ) 50 MG tablet Take 50 mg by mouth every 6 (six) hours as needed.   vitamin B-12 (CYANOCOBALAMIN ) 500 MCG tablet Take 500 mcg by mouth daily.   apixaban  (ELIQUIS ) 5 MG TABS tablet Take 5 mg by mouth 2 (two) times daily. (Patient not taking: Reported on 10/30/2023)   aspirin  EC 81 MG tablet Take 162 mg by mouth daily. Swallow whole. (Patient not taking: Reported on 10/30/2023)   buPROPion (WELLBUTRIN XL) 150 MG 24 hr tablet Take 150 mg by mouth daily. (Patient not taking: Reported on 10/30/2023)   No facility-administered medications prior to visit.   Allergies  Allergen Reactions   Acetaminophen -Codeine Other (See Comments)    Nausea and hot flash    Chantix [Varenicline] Nausea Only    Sour taste in mouth   Codeine Nausea Only   Gabapentin Swelling   Pregabalin Swelling    Immunization History  Administered Date(s) Administered   Moderna Sars-Covid-2 Vaccination 09/11/2019, 10/22/2019    Health Maintenance  Topic Date Due   HIV Screening  Never done   Hepatitis C Screening  Never done   DTaP/Tdap/Td (1 - Tdap) Never done   Pneumococcal Vaccine 26-84 Years old (1 of 2 - PCV) Never done   Zoster Vaccines- Shingrix (1 of 2) Never done   Colonoscopy  Never done   COVID-19 Vaccine (3 - Moderna risk series) 11/19/2019   INFLUENZA VACCINE  11/21/2023   Lung Cancer Screening  01/02/2024   Hepatitis B Vaccines  Aged Out   HPV VACCINES  Aged Out   Meningococcal B  Vaccine  Aged Out    Patient Care Team: Shalicia Craghead A, MD as PCP - General (Family Medicine)      Depression Screen    10/30/2023   11:09 AM 04/07/2023   11:09 AM  PHQ 2/9 Scores  PHQ - 2 Score 1 0  PHQ- 9 Score 8       Parris DELENA Juneau, MD  [facility] [phone] (phone) [phone] (fax)  [facility]

## 2023-10-31 ENCOUNTER — Ambulatory Visit (HOSPITAL_BASED_OUTPATIENT_CLINIC_OR_DEPARTMENT_OTHER): Payer: Self-pay

## 2023-10-31 DIAGNOSIS — I2510 Atherosclerotic heart disease of native coronary artery without angina pectoris: Secondary | ICD-10-CM | POA: Diagnosis not present

## 2023-10-31 LAB — CBC WITH DIFFERENTIAL/PLATELET
Absolute Lymphocytes: 1276 {cells}/uL (ref 850–3900)
Absolute Monocytes: 539 {cells}/uL (ref 200–950)
Basophils Absolute: 58 {cells}/uL (ref 0–200)
Basophils Relative: 1 %
Eosinophils Absolute: 99 {cells}/uL (ref 15–500)
Eosinophils Relative: 1.7 %
HCT: 43.3 % (ref 38.5–50.0)
Hemoglobin: 15 g/dL (ref 13.2–17.1)
MCH: 32.1 pg (ref 27.0–33.0)
MCHC: 34.6 g/dL (ref 32.0–36.0)
MCV: 92.5 fL (ref 80.0–100.0)
MPV: 10.9 fL (ref 7.5–12.5)
Monocytes Relative: 9.3 %
Neutro Abs: 3828 {cells}/uL (ref 1500–7800)
Neutrophils Relative %: 66 %
Platelets: 159 Thousand/uL (ref 140–400)
RBC: 4.68 Million/uL (ref 4.20–5.80)
RDW: 14.7 % (ref 11.0–15.0)
Total Lymphocyte: 22 %
WBC: 5.8 Thousand/uL (ref 3.8–10.8)

## 2023-10-31 LAB — TSH+FREE T4: TSH W/REFLEX TO FT4: 1.35 m[IU]/L (ref 0.40–4.50)

## 2023-10-31 LAB — HEMOGLOBIN A1C
Hgb A1c MFr Bld: 5.8 % — ABNORMAL HIGH (ref <5.7)
Mean Plasma Glucose: 120 mg/dL
eAG (mmol/L): 6.6 mmol/L

## 2023-10-31 LAB — IRON,TIBC AND FERRITIN PANEL
%SAT: 49 % — ABNORMAL HIGH (ref 20–48)
Ferritin: 56 ng/mL (ref 24–380)
Iron: 177 ug/dL (ref 50–180)
TIBC: 359 ug/dL (ref 250–425)

## 2023-11-01 LAB — URINALYSIS, ROUTINE W REFLEX MICROSCOPIC
Bilirubin Urine: NEGATIVE
Glucose, UA: NEGATIVE
Hgb urine dipstick: NEGATIVE
Ketones, ur: NEGATIVE
Leukocytes,Ua: NEGATIVE
Nitrite: NEGATIVE
Protein, ur: NEGATIVE
Specific Gravity, Urine: 1.005 (ref 1.001–1.035)
pH: 7 (ref 5.0–8.0)

## 2023-11-06 NOTE — Progress Notes (Signed)
 Referring Physician:  Everlene Parris LABOR, MD [address] [place],  [place] [address]  Primary Physician:  Everlene Parris LABOR, MD  History of Present Illness: 11/11/2023 Scott [name] has a history of aortic aneurysm, alcoholism, coronary artery disease, current DVT, COPD is here today for chief complaint of numbness and tingling in bilateral hands and feet.  He states that he is having numbness and tingling from his elbow down which goes into all 5 fingers.  He has previously been on high doses of gabapentin and Lyrica which has not relieved his pain.  He has that he has some minimal back pain, but he is mostly bothered by the neuropathy in his feet.  He states that he has not been able to feel his left foot for years.  He adds that he often has muscle spasms and jerking in the muscles in his legs.  He adds that he feels as though he walks sideways and is falling quite often, at least once a week.  He states that his fingers and hands have made it quite difficult to drive, text, he is dropping things very often.     Allergies  Allergen Reactions   Acetaminophen -Codeine Other (See Comments)    Nausea and hot flash    Chantix [Varenicline] Nausea Only    Sour taste in mouth   Codeine Nausea Only   Gabapentin Swelling   Pregabalin Swelling    Weakness: none Bowel/Bladder Dysfunction: none  Conservative measures:  Physical therapy:  has not participated in PT Multimodal medical therapy including regular antiinflammatories: Tramadol   Injections:  no epidural steroid injections  Past Surgery: none  Scott [name] has symptoms of cervical myelopathy.  The symptoms are causing a significant impact on the patient's life.   Review of Systems:  A 10 point review of systems is negative, except for the pertinent positives and negatives detailed in the HPI.  Past Medical History: Past Medical History:  Diagnosis Date   AAA (abdominal aortic aneurysm) without rupture (HCC)  02/23/2020   a.) AAA duplex 02/23/2020: 3.8 cm; b.) AAA duplex 11/28/2020: 3.7 cm; c.) CTA AO + BIFEM 12/18/2020: 3.8 x 3.4 cm; d.) AAA duplex 11/07/2021: 3.7 cm; e.) CTA AO + BIFEM 11/16/2021: 4.2 cm; f.) AAA duplex 05/28/2022: 4.0 cm; g.) AAA duplex 11/26/2022: 4.4 cm   Alcoholism (HCC)    Anginal pain (HCC)    Anxiety    Aortic atherosclerosis (HCC)    Arthritis    Bilateral carotid artery disease (HCC) 06/20/2020   a.) carotid doppler 06/20/2020: 1-39% RICA, 40-59% LICA; b.) carotid doppler 11/28/2020, 11/26/2022: 1-39% BICA   Bilateral inguinal hernia    CAD (coronary artery disease) 06/03/2017   a.) LHC 06/03/2017: 25% pLAD - med mgmt; b.) LHC 08/22/2021: 25% pLAD, 15% o-mLM, 25% dLM-pLAD, 25% o-mLCx, 25% oD2 - med mgmt   COPD (chronic obstructive pulmonary disease) (HCC)    Coronary artery disease    COVID-19 2022   Deep vein thrombosis (DVT) of left lower extremity (HCC)    Depression    Dilatation of thoracic aorta (HCC) 09/17/2022   a.) CT chest 09/17/2022: 4.0 cm; b.) CT chest 01/02/2023: 3.9 cm   Diverticulosis    Dupuytren contracture of both hands    Dyspnea    GERD (gastroesophageal reflux disease)    Hepatic steatosis    HLD (hyperlipidemia)    Hypertension    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   a.) PET CT 10/01/2022: 6 mm with SUV  max of 6.4; b.) s.p FNA 11/05/2022 --> pathology negative for malignancy (DDx oncocytosis vs oncocytoma)   On chronic clopidogrel  therapy    PAOD (peripheral arterial occlusive disease) (HCC)    a.) s/p BILATERAL endarterectomies 12/29/2020   Pre-diabetes    SVT (supraventricular tachycardia) (HCC)    a.) s/p ablation 02/28/2016        Past Surgical History: Past Surgical History:  Procedure Laterality Date   ANKLE SURGERY     bracelet   CHOLECYSTECTOMY  2000   COLONOSCOPY     ENDARTERECTOMY FEMORAL Bilateral 12/29/2020   Procedure: ENDARTERECTOMY FEMORAL;  Surgeon: Marea Selinda RAMAN, MD;  Location: [facility];  Service:  Vascular;  Laterality: Bilateral;  Left SFA and Tibial intervention   INSERTION OF MESH Bilateral 02/12/2023   Procedure: INSERTION OF MESH;  Surgeon: Rodolph Romano, MD;  Location: [facility];  Service: General;  Laterality: Bilateral;   LEFT HEART CATH AND CORONARY ANGIOGRAPHY Left 06/03/2017   Procedure: LEFT HEART CATH AND CORONARY ANGIOGRAPHY;  Surgeon: Florencio Cara BIRCH, MD;  Location: [facility];  Service: Cardiovascular;  Laterality: Left;   LEFT HEART CATH AND CORONARY ANGIOGRAPHY N/A 08/22/2021   Procedure: LEFT HEART CATH AND CORONARY ANGIOGRAPHY;  Surgeon: Florencio Cara BIRCH, MD;  Location: [facility];  Service: Cardiovascular;  Laterality: N/A;   LOWER EXTREMITY ANGIOGRAPHY Left 02/03/2020   Procedure: LOWER EXTREMITY ANGIOGRAPHY;  Surgeon: Marea Selinda RAMAN, MD;  Location: [facility];  Service: Cardiovascular;  Laterality: Left;   SVT ABLATION N/A 02/28/2016   TONSILLECTOMY      Allergies: Allergies as of 11/11/2023 - Review Complete 10/30/2023  Allergen Reaction Noted   Acetaminophen -codeine Other (See Comments) 08/09/2013   Chantix [varenicline] Nausea Only 02/20/2016   Codeine Nausea Only 12/07/2014   Gabapentin Swelling 11/01/2022   Pregabalin Swelling 11/01/2022    Medications: Outpatient Encounter Medications as of 11/11/2023  Medication Sig   albuterol  (VENTOLIN  HFA) 108 (90 Base) MCG/ACT inhaler Inhale 2 puffs into the lungs every 6 (six) hours as needed.   amLODipine  (NORVASC ) 10 MG tablet Take 10 mg by mouth daily.   atorvastatin  (LIPITOR) 40 MG tablet Take 40 mg by mouth daily.   budesonide-formoterol  (SYMBICORT) 160-4.5 MCG/ACT inhaler Inhale 2 puffs into the lungs.   cholecalciferol (VITAMIN D3) 25 MCG (1000 UNIT) tablet Take 1,000 Units by mouth daily.   cloNIDine  (CATAPRES ) 0.1 MG tablet Take 0.2 mg by mouth 2 (two) times daily.   clopidogrel  (PLAVIX ) 75 MG tablet Take 75 mg by mouth daily.   famotidine  (PEPCID ) 20 MG tablet  Take 20 mg by mouth 2 (two) times daily as needed for heartburn or indigestion.   furosemide (LASIX) 20 MG tablet Take 20 mg by mouth daily.   guaiFENesin  (MUCINEX ) 600 MG 12 hr tablet Take 600 mg by mouth 2 (two) times daily.   ipratropium-albuterol  (DUONEB) 0.5-2.5 (3) MG/3ML SOLN Inhale 3 mLs into the lungs every 6 (six) hours as needed.   isosorbide  mononitrate (IMDUR ) 30 MG 24 hr tablet Take 30 mg by mouth daily as needed.   lisinopril -hydrochlorothiazide  (ZESTORETIC ) 20-12.5 MG tablet Take 1 tablet by mouth 2 (two) times daily.   metoprolol  succinate (TOPROL -XL) 100 MG 24 hr tablet Take 100 mg by mouth daily. Take with or immediately following a meal.   nicotine polacrilex (COMMIT) 4 MG lozenge SMARTSIG:1 Lozenge(s) By Mouth Every 2 Hours PRN   nortriptyline (PAMELOR) 25 MG capsule Take 25 mg by mouth at bedtime.   potassium chloride   SA (KLOR-CON  M) 20 MEQ tablet Take 2 tablets (40 mEq) today, then take 1 tablet (20 mEq) daily until surgery. Be sure to take dose on day of procedure. Follow up with PCP for repeat labs.   propranolol (INDERAL) 10 MG tablet Take 10 mg by mouth 3 (three) times daily as needed.   rizatriptan (MAXALT-MLT) 10 MG disintegrating tablet Take 10 mg by mouth as needed.   traMADol  (ULTRAM ) 50 MG tablet Take 50 mg by mouth every 6 (six) hours as needed.   vitamin B-12 (CYANOCOBALAMIN ) 500 MCG tablet Take 500 mcg by mouth daily.   apixaban  (ELIQUIS ) 5 MG TABS tablet Take 5 mg by mouth 2 (two) times daily. (Patient not taking: Reported on 11/11/2023)   aspirin  EC 81 MG tablet Take 162 mg by mouth daily. Swallow whole. (Patient not taking: Reported on 10/30/2023)   buPROPion (WELLBUTRIN XL) 150 MG 24 hr tablet Take 150 mg by mouth daily. (Patient not taking: Reported on 10/30/2023)   No facility-administered encounter medications on file as of 11/11/2023.    Social History: Social History   Tobacco Use   Smoking status: Every Day    Current packs/day: 0.75    Types:  Cigarettes   Smokeless tobacco: Never  Vaping Use   Vaping status: Never Used  Substance Use Topics   Alcohol  use: Yes    Comment: 3-4 beers per day   Drug use: No    ppd  6-8     Family Medical History: Family History  Problem Relation Age of Onset   Diabetes Mother    Hypertension Mother    Lupus Mother    Heart attack Father    Cancer Brother    Cancer Brother     Physical Examination: @VITALWITHPAIN @  General: Patient is well developed, well nourished, calm, collected, and in no apparent distress. Attention to examination is appropriate.  Psychiatric: Patient is non-anxious.  Head:  Pupils equal, round, and reactive to light.  ENT:  Oral mucosa appears well hydrated.  Neck:   Supple.  Full range of motion.  Respiratory: Patient is breathing without any difficulty.  Extremities: No edema.  Vascular: Palpable dorsal pedal pulses.  Skin:   On exposed skin, there are no abnormal skin lesions.  NEUROLOGICAL:     Awake, alert, oriented to person, place, and time.  Speech is clear and fluent. Fund of knowledge is appropriate.   Cranial Nerves: Pupils equal round and reactive to light.  Facial tone is symmetric.    ROM of spine: No tenderness palpation    Strength: Side Biceps Triceps Deltoid Interossei Grip Wrist Ext. Wrist Flex.  R 5 5 5  3-4 4 5 5   L 5 5 5  3-4 4 4+ 5   Side Iliopsoas Quads Hamstring PF DF EHL  R 5 5 5 5 5 5   L 5 5 5 5 5 5    + tinel of ulnar  Hypothenar wasting of the left hand.     + hoffman sign, 3+ reflexes in bilateral upper extremities. + cross adductor, 3+ patella and achilles.  No clonus. Patient has difficulty with ambulation.  Unable to walk in tandem.  Medical Decision Making  Imaging: No recent imaging.   Assessment and Plan: Scott [name] is a pleasant 63 y.o. male has a history of aortic aneurysm, alcoholism, coronary artery disease, current DVT, COPD is here today for chief complaint of numbness and tingling in  bilateral hands and feet.  He states that he is having numbness and  tingling from his elbow down which goes into all 5 fingers.  He has previously been on high doses of gabapentin and Lyrica which has not relieved his pain.  He has that he has some minimal back pain, but he is mostly bothered by the neuropathy in his feet.  He states that he has not been able to feel his left foot for years.  He adds that he often has muscle spasms and jerking in the muscles in his legs.  He adds that he feels as though he walks sideways and is falling quite often, at least once a week.  He states that his fingers and hands have made it quite difficult to drive, text, he is dropping things very often.  Detailed examination is as above.  He does have weakness in bilateral hands.  He is hyperreflexic throughout the upper and lower extremities.  He is unable to walk with a tandem gait.  Pleasure to see patient in clinic today.  Concerned due to his gait instability, progressing neuropathy, and hyperreflexia.  This partially could be related to his alcohol  use however need to rule out myelopathy with cervical and lumbar MRI.  Will have patient undergo x-rays today with flexion and extension.  Would also like him to undergo EMG of bilateral upper extremities for evaluation of cervical radiculopathy versus a peripheral neuropathy considering hypothenar wasting and positive Tinel of ulnar nerve.  Thank you for involving me in the care of this patient.   I spent a total of 45 minutes in both face-to-face and non-face-to-face activities for this visit on the date of this encounter including preparing to see the patient, obtaining and reviewing separately obtained history, forming medically appropriate examination, counseling patient, ordering additional tests, documenting clinical information.   Lyle Decamp, PA-C Dept. of Neurosurgery

## 2023-11-11 ENCOUNTER — Ambulatory Visit
Admission: RE | Admit: 2023-11-11 | Discharge: 2023-11-11 | Disposition: A | Discharge status: Home / Self Care | Attending: Physician Assistant | Admitting: Physician Assistant

## 2023-11-11 ENCOUNTER — Ambulatory Visit: Admitting: Physician Assistant

## 2023-11-11 ENCOUNTER — Other Ambulatory Visit: Payer: Self-pay

## 2023-11-11 ENCOUNTER — Encounter: Payer: Self-pay | Admitting: Neurology

## 2023-11-11 ENCOUNTER — Encounter: Payer: Self-pay | Admitting: Physician Assistant

## 2023-11-11 ENCOUNTER — Ambulatory Visit
Admission: RE | Admit: 2023-11-11 | Discharge: 2023-11-11 | Disposition: A | Discharge status: Home / Self Care | Source: Ambulatory Visit | Attending: Physician Assistant | Admitting: Physician Assistant

## 2023-11-11 VITALS — BP 138/94 | Ht 71.0 in | Wt 155.0 lb

## 2023-11-11 DIAGNOSIS — R292 Abnormal reflex: Secondary | ICD-10-CM | POA: Insufficient documentation

## 2023-11-11 DIAGNOSIS — M4186 Other forms of scoliosis, lumbar region: Secondary | ICD-10-CM | POA: Diagnosis not present

## 2023-11-11 DIAGNOSIS — R202 Paresthesia of skin: Secondary | ICD-10-CM

## 2023-11-11 DIAGNOSIS — G629 Polyneuropathy, unspecified: Secondary | ICD-10-CM

## 2023-11-11 DIAGNOSIS — R200 Anesthesia of skin: Secondary | ICD-10-CM

## 2023-11-11 DIAGNOSIS — R262 Difficulty in walking, not elsewhere classified: Secondary | ICD-10-CM | POA: Diagnosis not present

## 2023-11-11 DIAGNOSIS — M5126 Other intervertebral disc displacement, lumbar region: Secondary | ICD-10-CM | POA: Diagnosis not present

## 2023-11-11 DIAGNOSIS — G8929 Other chronic pain: Secondary | ICD-10-CM | POA: Insufficient documentation

## 2023-11-11 DIAGNOSIS — M62838 Other muscle spasm: Secondary | ICD-10-CM

## 2023-11-11 DIAGNOSIS — R519 Headache, unspecified: Secondary | ICD-10-CM | POA: Insufficient documentation

## 2023-11-11 DIAGNOSIS — M542 Cervicalgia: Secondary | ICD-10-CM | POA: Insufficient documentation

## 2023-11-11 DIAGNOSIS — M47816 Spondylosis without myelopathy or radiculopathy, lumbar region: Secondary | ICD-10-CM | POA: Diagnosis not present

## 2023-11-11 DIAGNOSIS — M545 Low back pain, unspecified: Secondary | ICD-10-CM

## 2023-11-11 DIAGNOSIS — M4312 Spondylolisthesis, cervical region: Secondary | ICD-10-CM | POA: Diagnosis not present

## 2023-11-11 DIAGNOSIS — F1010 Alcohol abuse, uncomplicated: Secondary | ICD-10-CM

## 2023-11-12 ENCOUNTER — Encounter: Payer: Self-pay | Admitting: Physician Assistant

## 2023-11-15 ENCOUNTER — Ambulatory Visit
Admission: RE | Admit: 2023-11-15 | Discharge: 2023-11-15 | Disposition: A | Discharge status: Home / Self Care | Source: Ambulatory Visit | Attending: Physician Assistant | Admitting: Physician Assistant

## 2023-11-15 DIAGNOSIS — I714 Abdominal aortic aneurysm, without rupture, unspecified: Secondary | ICD-10-CM | POA: Diagnosis not present

## 2023-11-15 DIAGNOSIS — G8929 Other chronic pain: Secondary | ICD-10-CM

## 2023-11-15 DIAGNOSIS — M47812 Spondylosis without myelopathy or radiculopathy, cervical region: Secondary | ICD-10-CM | POA: Diagnosis not present

## 2023-11-15 DIAGNOSIS — R200 Anesthesia of skin: Secondary | ICD-10-CM

## 2023-11-15 DIAGNOSIS — M5441 Lumbago with sciatica, right side: Secondary | ICD-10-CM | POA: Diagnosis not present

## 2023-11-15 DIAGNOSIS — R292 Abnormal reflex: Secondary | ICD-10-CM

## 2023-11-15 DIAGNOSIS — M542 Cervicalgia: Secondary | ICD-10-CM | POA: Diagnosis not present

## 2023-11-15 DIAGNOSIS — M5442 Lumbago with sciatica, left side: Secondary | ICD-10-CM | POA: Diagnosis not present

## 2023-11-15 DIAGNOSIS — M48061 Spinal stenosis, lumbar region without neurogenic claudication: Secondary | ICD-10-CM | POA: Diagnosis not present

## 2023-11-15 DIAGNOSIS — M79603 Pain in arm, unspecified: Secondary | ICD-10-CM | POA: Diagnosis not present

## 2023-11-18 ENCOUNTER — Ambulatory Visit (INDEPENDENT_AMBULATORY_CARE_PROVIDER_SITE_OTHER): Admitting: Physician Assistant

## 2023-11-18 ENCOUNTER — Encounter (INDEPENDENT_AMBULATORY_CARE_PROVIDER_SITE_OTHER): Payer: Self-pay | Admitting: Nurse Practitioner

## 2023-11-18 DIAGNOSIS — M545 Other chronic pain: Secondary | ICD-10-CM

## 2023-11-18 DIAGNOSIS — G959 Disease of spinal cord, unspecified: Secondary | ICD-10-CM

## 2023-11-18 DIAGNOSIS — G629 Polyneuropathy, unspecified: Secondary | ICD-10-CM | POA: Diagnosis not present

## 2023-11-18 DIAGNOSIS — I7133 Infrarenal abdominal aortic aneurysm, ruptured: Secondary | ICD-10-CM

## 2023-11-18 DIAGNOSIS — R200 Anesthesia of skin: Secondary | ICD-10-CM

## 2023-11-18 NOTE — Progress Notes (Signed)
 EXAM: MRI CERVICAL SPINE WITHOUT CONTRAST   TECHNIQUE: Multiplanar, multisequence MR imaging of the cervical spine was performed. No intravenous contrast was administered.   COMPARISON:  None Available.   FINDINGS: Alignment: Trace anterolisthesis C5 on C6 and straightening of normal cervical lordosis are seen.   Vertebrae: No fracture, evidence of discitis, or bone lesion. Multilevel degenerative endplate signal change is seen.   Cord: Normal signal throughout.   Posterior Fossa, vertebral arteries, paraspinal tissues: Negative.   Disc levels:   C2-3: Very shallow disc bulge and mild uncovertebral spurring without stenosis.   C3-4: Advanced facet degenerative change on the left with an associated small facet joint effusion. There is a disc bulge, uncovertebral spurring and ligamentum flavum thickening, asymmetrically worse on the left. Mild flattening of the ventral cord and marked left foraminal narrowing are seen. There is mild to moderate right foraminal narrowing.   C4-5: Disc bulge, bilateral facet degenerative change, ligamentum flavum thickening and uncovertebral spurring are seen. The ventral thecal sac is effaced. Moderate to severe foraminal narrowing is worse on the right.   C5-6: Left worse than right facet degenerative change, disc bulge and uncovertebral spurring are seen. There is also some ligamentum flavum thickening. Mild flattening of the ventral cord and marked bilateral foraminal narrowing are present.   C6-7: There is a shallow disc bulge, left worse than right uncovertebral degenerative change and some facet arthropathy. There is mild flattening of the ventral cord. Moderate to severe foraminal narrowing is worse on the left.   C7-T1: There is a shallow disc bulge and some uncovertebral disease. No stenosis.   IMPRESSION: 1. Multilevel cervical spondylosis as described above. There is mild flattening of the ventral cord at C3-4, C5-6 and  C6-7. 2. Marked left foraminal narrowing at C3-4, moderate to severe foraminal narrowing at C4-5 and C6-7 and marked bilateral foraminal narrowing at C5-6.     Narrative & Impression  CLINICAL DATA:  Low back pain with bilateral lower extremity pain and numbness.   EXAM: MRI LUMBAR SPINE WITHOUT CONTRAST   TECHNIQUE: Multiplanar, multisequence MR imaging of the lumbar spine was performed. No intravenous contrast was administered.   COMPARISON:  Plain films lumbar spine 11/11/2018. PET CT scan 10/01/2022.   FINDINGS: Segmentation:  Standard.   Alignment: Mild convex left curvature with the apex at approximately L2-3. Trace degenerative retrolisthesis L2 on L3.   Vertebrae:  No fracture, evidence of discitis, or bone lesion.   Conus medullaris and cauda equina: Conus extends to the T12-L1 level. Conus and cauda equina appear normal.   Paraspinal and other soft tissues: Abdominal aortic aneurysm is identified as seen on prior exams.   Disc levels:   T11-12 is imaged in the sagittal plane only. There is a very shallow disc bulge at this level but the central canal and foramina appear open.   T12-L1: Negative.   L1-2: Negative.   L2-3: A shallow disc bulge and somewhat dorsal epidural fat cause mild compression of the thecal sac. The foramina are open.   L3-4: There is a broad-based disc bulge, right worse than left facet degenerative change and some ligamentum flavum thickening. Dorsal epidural fat is somewhat prominent. There is moderately severe compression of the thecal sac and narrowing of both lateral recesses. Mild to moderate foraminal narrowing is worse on the right.   L4-5: Shallow broad-based disc bulge and mild facet degenerative change. There is mild central canal stenosis and bilateral foraminal narrowing.   L5-S1: Shallow disc bulge is somewhat more  prominent to the left. There is mild left foraminal narrowing. The central canal and right foramen  are open.   IMPRESSION: 1. Moderately severe compression of the thecal sac and narrowing of both lateral recesses at L3-4 due to a broad-based disc bulge, right worse than left facet degenerative change and ligamentum flavum thickening. Mild to moderate foraminal narrowing at this level is worse on the right. 2. Mild central canal stenosis at L2-3 and L4-5. 3. Mild left foraminal narrowing L5-S1. 4. Abdominal aortic aneurysm as seen on prior exams.      EXAM: LUMBAR SPINE - COMPLETE 4+ VIEW; CERVICAL SPINE - COMPLETE 4+ VIEW   COMPARISON:  No prior cervical spine study. Comparison is made with CTA abdomen and pelvis 11/16/2021.   FINDINGS: Four views of cervical spine with AP, lateral, and flexion extension lateral views:   There is osteopenia with no evidence of fractures. No traumatic listhesis is suspected.   There is moderately bulky facet hypertrophy C2-3 through C6-7. Multilevel uncinate spurring.   No precervical soft tissue thickening is seen. There is mild disc narrowing C3-4 and moderate disc space loss C4-5, C5-6, C6-7 and C7-T1.   At C2-3 there is 3 mm anterolisthesis in flexion and neutral, which resolves with extension.   At C3-4 there is 3 mm anterolisthesis in neutral, which increases to 4 mm in flexion and resolves with extension.   At C4-5 there is 2 mm anterolisthesis in neutral and flexion, which resolves with extension.   At C5-6 there is 3 mm anterolisthesis and neutral and flexion which resolves with extension.   There are no further alignment abnormalities, no widening of the anterior atlantodental joint in any position.   Lung apices are clear. There are heavy calcifications at the level of the carotid bifurcations.   Lumbar spine four views with AP, neutral lateral, and flexion and extension lateral views:   The right side marker was mistakenly placed on the left. There is mild levorotary lumbar scoliosis apex at L3.   There is  osteopenia with mild chronic anterior wedging of the T11 and 12 vertebral bodies. There are 5 lumbar type segments.   The lumbar vertebra are normal in heights with no evidence of fractures. Moderate lumbar spondylosis.   There is again noted mild disc space loss at L4-5 and L5-S1, normal disc heights above L4.   There is moderate facet hypertrophy from L3-4 down, mild facet spurring L2-3.   At L1-2, there is a grade 1 retrolisthesis measuring 4 mm in extension, 3 mm in neutral and 3 mm in flexion.   At L2-3, there is grade 1 retrolisthesis measuring 4 mm in all 3 positions.   At L3-4, grade 1 retrolisthesis measures 4 mm in extension, is 3 mm in neutral, 4 mm again in flexion.   The sagittal alignment is otherwise normal. There is extensive aortoiliac calcific plaque.   There is a large infrarenal fusiform to bulbous AAA, measuring maximum 6.3 cm AP and 5.7 cm transverse. Corresponding measurements in 2023 were 4.2 x 5.3 cm, respectively.   The SI joints unremarkable, as visualized.   IMPRESSION: 1. Osteopenia and degenerative change without evidence of cervical or lumbar fractures. 2. Grade 1 anterolisthesis C2-3 through C5-6, with resolution in extension. 3. Grade 1 retrolisthesis L1-2, L2-3, and L3-4, with variation on at L1-2 and L3-4. 4. Mild levorotary lumbar scoliosis. 5. Large infrarenal fusiform to bulbous AAA, measuring 6.3 x 5.7 cm, increased from 4.2 x 5.3 cm in 2023. Recommend repeat CTA  abdomen and pelvis and referral to a vascular specialist. *Vascular specialist has been reached out to as of 11/18/2023 regarding increased to AAA 6. Aortic and carotid atherosclerosis.     Spoke with the patient via telephone regarding his recent MRI results.  Concern for progressive myelopathy however also concerned that patient has current DVT, progressing emphysema, AAA, significant alcohol  use ongoing concurrently.  Patient's largest complaint is significant neuropathic  pain.  He was on very high doses of gabapentin and Lyrica previously, but that no longer is the case.  Unsure if he would qualify for potential stimulator in the future for his pain.  I counseled patient that there is a possibility that his burning neuropathic pain would not improve given that his multimodal in nature due to likely both his spinal issues as well as alcoholism.  He does understand this.  However would like him to see Dr. Claudene as soon as possible in clinic to discuss more of a plan moving forward.  He currently has a EMG scheduled for September.    This visit was performed via telephone.  Patient location: home Provider location: office  I spent a total of 20 minutes non-face-to-face activities for this visit on the date of this encounter including review of current clinical condition and response to treatment.  The patient is aware of and accepts the limits of this telehealth visit.

## 2023-11-19 ENCOUNTER — Telehealth (INDEPENDENT_AMBULATORY_CARE_PROVIDER_SITE_OTHER): Payer: Self-pay | Admitting: Vascular Surgery

## 2023-11-19 NOTE — Progress Notes (Signed)
 Spoke w/ patient and he would like a return call on Friday to schedule.

## 2023-11-19 NOTE — Telephone Encounter (Signed)
 ATC pt X 2 to advise of the emergent CT ordered by Orvin Daring, NP. Dr. Marea is wanting to have CT performed before patient's appt on Friday 8.1.25. Patient did not answer and no VM is set up to leave message. Unable to reach. [employer] only authorizes CT to be performed at [facility]. I will attempt call back.

## 2023-11-20 ENCOUNTER — Ambulatory Visit
Admission: RE | Admit: 2023-11-20 | Discharge: 2023-11-20 | Disposition: A | Discharge status: Home / Self Care | Source: Ambulatory Visit | Attending: Nurse Practitioner

## 2023-11-20 ENCOUNTER — Encounter (INDEPENDENT_AMBULATORY_CARE_PROVIDER_SITE_OTHER): Payer: Self-pay

## 2023-11-20 ENCOUNTER — Telehealth (INDEPENDENT_AMBULATORY_CARE_PROVIDER_SITE_OTHER): Payer: Self-pay | Admitting: Vascular Surgery

## 2023-11-20 DIAGNOSIS — I7133 Infrarenal abdominal aortic aneurysm, ruptured: Secondary | ICD-10-CM

## 2023-11-20 DIAGNOSIS — I701 Atherosclerosis of renal artery: Secondary | ICD-10-CM | POA: Diagnosis not present

## 2023-11-20 MED ORDER — IOPAMIDOL (ISOVUE-370) INJECTION 76%
100.0000 mL | Freq: Once | INTRAVENOUS | Status: AC | PRN
  Administered 2023-11-20: 80 mL via INTRAVENOUS

## 2023-11-20 NOTE — Telephone Encounter (Signed)
 Spoke with patient and gave information for [facility] for patient to call and schedule emergent CT before his appt tomorrow with Dr. Marea. I provided number and instructed to try and get CT performed today. Patient acknowledged and also acknowledged his appt with Dr. Marea at 11:15 tomorrow 8.1.25.

## 2023-11-21 ENCOUNTER — Encounter (INDEPENDENT_AMBULATORY_CARE_PROVIDER_SITE_OTHER): Payer: Self-pay | Admitting: Vascular Surgery

## 2023-11-21 ENCOUNTER — Ambulatory Visit (INDEPENDENT_AMBULATORY_CARE_PROVIDER_SITE_OTHER): Admitting: Vascular Surgery

## 2023-11-21 VITALS — BP 169/106 | HR 74 | Resp 18 | Wt 156.6 lb

## 2023-11-21 DIAGNOSIS — I70229 Atherosclerosis of native arteries of extremities with rest pain, unspecified extremity: Secondary | ICD-10-CM | POA: Diagnosis not present

## 2023-11-21 DIAGNOSIS — I10 Essential (primary) hypertension: Secondary | ICD-10-CM

## 2023-11-21 DIAGNOSIS — E7800 Pure hypercholesterolemia, unspecified: Secondary | ICD-10-CM | POA: Diagnosis not present

## 2023-11-21 DIAGNOSIS — I890 Lymphedema, not elsewhere classified: Secondary | ICD-10-CM

## 2023-11-21 DIAGNOSIS — I7143 Infrarenal abdominal aortic aneurysm, without rupture: Secondary | ICD-10-CM

## 2023-11-21 NOTE — Assessment & Plan Note (Signed)
 Previous revascularization.  Has noninvasive studies for later this year.  No current limb threatening symptoms.

## 2023-11-21 NOTE — Assessment & Plan Note (Signed)
 He had an MRI of his back done which suggested a marked increase in the size of his abdominal aortic aneurysm now measuring up to 6.3 cm in maximal diameter.  This prompted us  to order a CT angiogram which I have independently reviewed.  The official report is now of a 4.8 cm infrarenal abdominal aortic aneurysm.  It appears to be slightly larger than this by transverse dimensions, but clearly not the 6.3 cm that the MRI had suggested.  There is no evidence of dissection or rupture.  This still does demonstrate some growth from last year where it measured 4.4 cm in maximal diameter. We had a long talk with him today.  The MRI tends to dramatically overestimate the size of an aneurysm which was clearly the case here.  His aneurysm is growing and is approaching size that we will need to consider repair, but at this point continued observation should be fine.  He has an appointment later this year in about 3 months and should keep that.  Blood pressure control is important for reducing the risk of rupture.  His blood pressure log at home is actually been quite good.

## 2023-11-21 NOTE — Progress Notes (Signed)
 MRN : [id number]  Scott [name] is a 63 y.o. (March 28, 1961) male who presents with chief complaint of No chief complaint on file. SABRA  History of Present Illness: Patient returns today in follow up of his abdominal aortic aneurysm.  We have been following his aneurysm.  We also follow him for peripheral arterial disease.  He has significant neuropathic pain in the legs.  He remains on aspirin , Eliquis , and a statin agent.  He had an MRI of his back done which suggested a marked increase in the size of his abdominal aortic aneurysm now measuring up to 6.3 cm in maximal diameter.  This prompted us  to order a CT angiogram which I have independently reviewed.  The official report is now of a 4.8 cm infrarenal abdominal aortic aneurysm.  It appears to be slightly larger than this by transverse dimensions, but clearly not the 6.3 cm that the MRI had suggested.  There is no evidence of dissection or rupture.  This still does demonstrate some growth from last year where it measured 4.4 cm in maximal diameter.  Current Outpatient Medications  Medication Sig Dispense Refill   albuterol  (VENTOLIN  HFA) 108 (90 Base) MCG/ACT inhaler Inhale 2 puffs into the lungs every 6 (six) hours as needed.     amLODipine  (NORVASC ) 10 MG tablet Take 10 mg by mouth daily.     apixaban  (ELIQUIS ) 5 MG TABS tablet Take 5 mg by mouth 2 (two) times daily. (Patient not taking: Reported on 11/11/2023)     aspirin  EC 81 MG tablet Take 162 mg by mouth daily. Swallow whole. (Patient not taking: Reported on 10/30/2023)     atorvastatin  (LIPITOR) 40 MG tablet Take 40 mg by mouth daily.     budesonide-formoterol  (SYMBICORT) 160-4.5 MCG/ACT inhaler Inhale 2 puffs into the lungs.     buPROPion (WELLBUTRIN XL) 150 MG 24 hr tablet Take 150 mg by mouth daily. (Patient not taking: Reported on 10/30/2023)     cholecalciferol (VITAMIN D3) 25 MCG (1000 UNIT) tablet Take 1,000 Units by mouth daily.     cloNIDine  (CATAPRES ) 0.1 MG tablet Take 0.2 mg by  mouth 2 (two) times daily.     clopidogrel  (PLAVIX ) 75 MG tablet Take 75 mg by mouth daily.     famotidine  (PEPCID ) 20 MG tablet Take 20 mg by mouth 2 (two) times daily as needed for heartburn or indigestion.     furosemide (LASIX) 20 MG tablet Take 20 mg by mouth daily.     guaiFENesin  (MUCINEX ) 600 MG 12 hr tablet Take 600 mg by mouth 2 (two) times daily.     ipratropium-albuterol  (DUONEB) 0.5-2.5 (3) MG/3ML SOLN Inhale 3 mLs into the lungs every 6 (six) hours as needed.     isosorbide  mononitrate (IMDUR ) 30 MG 24 hr tablet Take 30 mg by mouth daily as needed.  5   lisinopril -hydrochlorothiazide  (ZESTORETIC ) 20-12.5 MG tablet Take 1 tablet by mouth 2 (two) times daily.     metoprolol  succinate (TOPROL -XL) 100 MG 24 hr tablet Take 100 mg by mouth daily. Take with or immediately following a meal.     nicotine polacrilex (COMMIT) 4 MG lozenge SMARTSIG:1 Lozenge(s) By Mouth Every 2 Hours PRN     nortriptyline (PAMELOR) 25 MG capsule Take 25 mg by mouth at bedtime.     potassium chloride  SA (KLOR-CON  M) 20 MEQ tablet Take 2 tablets (40 mEq) today, then take 1 tablet (20 mEq) daily until surgery. Be sure to take dose on day of  procedure. Follow up with PCP for repeat labs. 9 tablet 0   propranolol (INDERAL) 10 MG tablet Take 10 mg by mouth 3 (three) times daily as needed.     rizatriptan (MAXALT-MLT) 10 MG disintegrating tablet Take 10 mg by mouth as needed.     traMADol  (ULTRAM ) 50 MG tablet Take 50 mg by mouth every 6 (six) hours as needed.     vitamin B-12 (CYANOCOBALAMIN ) 500 MCG tablet Take 500 mcg by mouth daily.     No current facility-administered medications for this visit.    Past Medical History:  Diagnosis Date   AAA (abdominal aortic aneurysm) without rupture (HCC) 02/23/2020   a.) AAA duplex 02/23/2020: 3.8 cm; b.) AAA duplex 11/28/2020: 3.7 cm; c.) CTA AO + BIFEM 12/18/2020: 3.8 x 3.4 cm; d.) AAA duplex 11/07/2021: 3.7 cm; e.) CTA AO + BIFEM 11/16/2021: 4.2 cm; f.) AAA duplex  05/28/2022: 4.0 cm; g.) AAA duplex 11/26/2022: 4.4 cm   Alcoholism (HCC)    Anginal pain (HCC)    Anxiety    Aortic atherosclerosis (HCC)    Arthritis    Bilateral carotid artery disease (HCC) 06/20/2020   a.) carotid doppler 06/20/2020: 1-39% RICA, 40-59% LICA; b.) carotid doppler 11/28/2020, 11/26/2022: 1-39% BICA   Bilateral inguinal hernia    CAD (coronary artery disease) 06/03/2017   a.) LHC 06/03/2017: 25% pLAD - med mgmt; b.) LHC 08/22/2021: 25% pLAD, 15% o-mLM, 25% dLM-pLAD, 25% o-mLCx, 25% oD2 - med mgmt   COPD (chronic obstructive pulmonary disease) (HCC)    Coronary artery disease    COVID-19 2022   Deep vein thrombosis (DVT) of left lower extremity (HCC)    Depression    Dilatation of thoracic aorta (HCC) 09/17/2022   a.) CT chest 09/17/2022: 4.0 cm; b.) CT chest 01/02/2023: 3.9 cm   Diverticulosis    Dupuytren contracture of both hands    Dyspnea    GERD (gastroesophageal reflux disease)    Hepatic steatosis    HLD (hyperlipidemia)    Hypertension    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   a.) PET CT 10/01/2022: 6 mm with SUV max of 6.4; b.) s.p FNA 11/05/2022 --> pathology negative for malignancy (DDx oncocytosis vs oncocytoma)   On chronic clopidogrel  therapy    PAOD (peripheral arterial occlusive disease) (HCC)    a.) s/p BILATERAL endarterectomies 12/29/2020   Pre-diabetes    SVT (supraventricular tachycardia) (HCC)    a.) s/p ablation 02/28/2016    Past Surgical History:  Procedure Laterality Date   ANKLE SURGERY     bracelet   CHOLECYSTECTOMY  2000   COLONOSCOPY     ENDARTERECTOMY FEMORAL Bilateral 12/29/2020   Procedure: ENDARTERECTOMY FEMORAL;  Surgeon: Marea Selinda RAMAN, MD;  Location: [facility];  Service: Vascular;  Laterality: Bilateral;  Left SFA and Tibial intervention   INSERTION OF MESH Bilateral 02/12/2023   Procedure: INSERTION OF MESH;  Surgeon: Rodolph Romano, MD;  Location: [facility];  Service: General;  Laterality: Bilateral;   LEFT  HEART CATH AND CORONARY ANGIOGRAPHY Left 06/03/2017   Procedure: LEFT HEART CATH AND CORONARY ANGIOGRAPHY;  Surgeon: Florencio Cara BIRCH, MD;  Location: [facility];  Service: Cardiovascular;  Laterality: Left;   LEFT HEART CATH AND CORONARY ANGIOGRAPHY N/A 08/22/2021   Procedure: LEFT HEART CATH AND CORONARY ANGIOGRAPHY;  Surgeon: Florencio Cara BIRCH, MD;  Location: [facility];  Service: Cardiovascular;  Laterality: N/A;   LOWER EXTREMITY ANGIOGRAPHY Left 02/03/2020   Procedure: LOWER EXTREMITY ANGIOGRAPHY;  Surgeon:  Marea Selinda RAMAN, MD;  Location: [facility];  Service: Cardiovascular;  Laterality: Left;   SVT ABLATION N/A 02/28/2016   TONSILLECTOMY       Social History   Tobacco Use   Smoking status: Every Day    Current packs/day: 0.75    Types: Cigarettes   Smokeless tobacco: Never  Vaping Use   Vaping status: Never Used  Substance Use Topics   Alcohol  use: Yes    Comment: 3-4 beers per day   Drug use: No       Family History  Problem Relation Age of Onset   Diabetes Mother    Hypertension Mother    Lupus Mother    Heart attack Father    Cancer Brother    Cancer Brother      Allergies  Allergen Reactions   Acetaminophen -Codeine Other (See Comments)    Nausea and hot flash    Chantix [Varenicline] Nausea Only    Sour taste in mouth   Codeine Nausea Only   Gabapentin Swelling   Pregabalin Swelling     REVIEW OF SYSTEMS (Negative unless checked)   Constitutional: [] Weight loss  [] Fever  [] Chills Cardiac: [] Chest pain   [] Chest pressure   [] Palpitations   [] Shortness of breath when laying flat   [] Shortness of breath at rest   [] Shortness of breath with exertion. Vascular:  [] Pain in legs with walking   [x] Pain in legs at rest   [] Pain in legs when laying flat   [] Claudication   [] Pain in feet when walking  [] Pain in feet at rest  [] Pain in feet when laying flat   [] History of DVT   [] Phlebitis   [x] Swelling in legs   [] Varicose veins    [] Non-healing ulcers Pulmonary:   [] Uses home oxygen   [] Productive cough   [] Hemoptysis   [] Wheeze  [x] COPD   [] Asthma Neurologic:  [] Dizziness  [] Blackouts   [] Seizures   [] History of stroke   [] History of TIA  [] Aphasia   [] Temporary blindness   [] Dysphagia   [] Weakness or numbness in arms   [x] Weakness or numbness in legs Musculoskeletal:  [x] Arthritis   [] Joint swelling   [x] Joint pain   [] Low back pain Hematologic:  [] Easy bruising  [] Easy bleeding   [] Hypercoagulable state   [] Anemic   Gastrointestinal:  [] Blood in stool   [] Vomiting blood  [] Gastroesophageal reflux/heartburn   [] Abdominal pain Genitourinary:  [] Chronic kidney disease   [] Difficult urination  [] Frequent urination  [] Burning with urination   [] Hematuria Skin:  [] Rashes   [] Ulcers   [] Wounds Psychological:  [x] History of anxiety   []  History of major depression.  Physical Examination  There were no vitals taken for this visit. Gen:  WD/WN, NAD Head: [place]/AT, No temporalis wasting. Ear/Nose/Throat: Hearing grossly intact, nares w/o erythema or drainage Eyes: Conjunctiva clear. Sclera non-icteric Neck: Supple.  Trachea midline Pulmonary:  Good air movement, no use of accessory muscles.  Cardiac: RRR, no JVD Vascular:  Vessel Right Left  Radial Palpable Palpable                          PT 1+ Palpable Not Palpable  DP 1+ Palpable 1+ Palpable   Gastrointestinal: soft, non-tender/non-distended. No guarding/reflex.  Musculoskeletal: M/S 5/5 throughout.  No deformity or atrophy.  Stasis dermatitis changes and 1+ left lower extremity edema is present. Neurologic: Sensation grossly intact in extremities.  Symmetrical.  Speech is fluent.  Psychiatric: Judgment intact, Mood & affect appropriate  for pt's clinical situation. Dermatologic: No rashes or ulcers noted.  No cellulitis or open wounds.      Labs Recent Results (from the past 2160 hours)  CBC with Differential/Platelet     Status: None   Collection Time:  10/30/23 11:48 AM  Result Value Ref Range   WBC 5.8 3.8 - 10.8 Thousand/uL   RBC 4.68 4.20 - 5.80 Million/uL   Hemoglobin 15.0 13.2 - 17.1 g/dL   HCT 56.6 61.4 - 49.9 %   MCV 92.5 80.0 - 100.0 fL   MCH 32.1 27.0 - 33.0 pg   MCHC 34.6 32.0 - 36.0 g/dL    Comment: For adults, a slight decrease in the calculated MCHC value (in the range of 30 to 32 g/dL) is most likely not clinically significant; however, it should be interpreted with caution in correlation with other red cell parameters and the patient's clinical condition.    RDW 14.7 11.0 - 15.0 %   Platelets 159 140 - 400 Thousand/uL   MPV 10.9 7.5 - 12.5 fL   Neutro Abs 3,828 1,500 - 7,800 cells/uL   Absolute Lymphocytes 1,276 850 - 3,900 cells/uL   Absolute Monocytes 539 200 - 950 cells/uL   Eosinophils Absolute 99 15 - 500 cells/uL   Basophils Absolute 58 0 - 200 cells/uL   Neutrophils Relative % 66 %   Total Lymphocyte 22.0 %   Monocytes Relative 9.3 %   Eosinophils Relative 1.7 %   Basophils Relative 1.0 %  Hemoglobin A1c     Status: Abnormal   Collection Time: 10/30/23 11:48 AM  Result Value Ref Range   Hgb A1c MFr Bld 5.8 (H) <5.7 %    Comment: For someone without known diabetes, a hemoglobin  A1c value between 5.7% and 6.4% is consistent with prediabetes and should be confirmed with a  follow-up test. . For someone with known diabetes, a value <7% indicates that their diabetes is well controlled. A1c targets should be individualized based on duration of diabetes, age, comorbid conditions, and other considerations. . This assay result is consistent with an increased risk of diabetes. . Currently, no consensus exists regarding use of hemoglobin A1c for diagnosis of diabetes for children. .    Mean Plasma Glucose 120 mg/dL   eAG (mmol/L) 6.6 mmol/L  Iron, TIBC and Ferritin Panel     Status: Abnormal   Collection Time: 10/30/23 11:48 AM  Result Value Ref Range   Iron 177 50 - 180 mcg/dL   TIBC 640 749 -  574 mcg/dL (calc)   %SAT 49 (H) 20 - 48 % (calc)   Ferritin 56 24 - 380 ng/mL  TSH + free T4     Status: None   Collection Time: 10/30/23 11:48 AM  Result Value Ref Range   TSH W/REFLEX TO FT4 1.35 0.40 - 4.50 mIU/L  Urinalysis, Routine w reflex microscopic     Status: None   Collection Time: 10/31/23  1:04 PM  Result Value Ref Range   Color, Urine YELLOW YELLOW   APPearance CLEAR CLEAR   Specific Gravity, Urine 1.005 1.001 - 1.035   pH 7.0 5.0 - 8.0   Glucose, UA NEGATIVE NEGATIVE   Bilirubin Urine NEGATIVE NEGATIVE   Ketones, ur NEGATIVE NEGATIVE   Hgb urine dipstick NEGATIVE NEGATIVE   Protein, ur NEGATIVE NEGATIVE   Nitrite NEGATIVE NEGATIVE   Leukocytes,Ua NEGATIVE NEGATIVE    Radiology CT Angio Abd/Pel w/ and/or w/o Result Date: 11/20/2023 EXAM: CTA ABDOMEN AND PELVIS WITHOUT AND  WITH CONTRAST 11/20/2023 11:58:29 AM TECHNIQUE: CTA images of the abdomen and pelvis without and with intravenous contrast. Three-dimensional MIP/volume rendered formations were performed. Automated exposure control, iterative reconstruction, and/or weight based adjustment of the mA/kV was utilized to reduce the radiation dose to as low as reasonably achievable. COMPARISON: PET/CT 10/01/2022 and prior CTA 11/16/2021. CLINICAL HISTORY: Abdominal aortic aneurysm (AAA), pre-op planning. Known AAA since 2021; previous MRI shows enlarging AAA; concern for rapid growth. FINDINGS: VASCULATURE: AORTA: Extensive atheromatous plaque through the abdominal aorta. Infrarenal abdominal aortic aneurysm measuring 4.8 cm in maximum transverse diameter and 4.6 cm maximum AP diameter, previously 4.3 cm on 10/01/2022. No evidence of leak or impending rupture. CELIAC TRUNK: Mildly atheromatous origin plaque without stenosis, classic trifurcation anatomy. SUPERIOR MESENTERIC ARTERY: Moderately atheromatous with replaced right hepatic arterial supply , an anatomic variant. INFERIOR MESENTERIC ARTERY: Indeterminate patency at its  origin, arising from the aneurysmal segment of the RENAL ARTERIES: Single left renal artery with calcified plaque extending the length of 2.3 cm, resulting in at least moderate regions of stenosis, patent distally. Single right renal artery has some calcified atherosclerotic plaque, no high-grade stenosis. ILIAC ARTERIES: Bilateral common iliac arteries are heavily atheromatous without aneurysm or stenosis. Bilateral external and internal iliac arteries heavily atheromatous without stenosis. There is origin occlusion of the right SFA. Patent stent at the origin of the left SFA, incompletely visualized distally. LIVER: The liver is unremarkable. GALLBLADDER AND BILE DUCTS: Cholecystectomy clips. SPLEEN: The spleen is unremarkable. PANCREAS: The pancreas is unremarkable. ADRENAL GLANDS: Bilateral adrenal glands demonstrate no acute abnormality. KIDNEYS, URETERS AND BLADDER: 2 cm wedge-like focus in the lower pole of the right kidney posteriorly, not evident on prior CTA 11/16/2021, with minimal mass effect. Considerations include focal pyelonephritis, infarct, or neoplasm. No stones in the kidneys or ureters. No hydronephrosis. No perinephric or periureteral stranding. Urinary bladder is unremarkable. GI AND BOWEL: Stomach and duodenal sweep demonstrate no acute abnormality. Normal appendix. Scattered descending segment colonic diverticula. Multiple sigmoid diverticula without adjacent inflammatory change. There is no bowel obstruction. No abnormal bowel wall thickening or distension. REPRODUCTIVE: Mild prostate enlargement with central calcification. PERITONEUM AND RETRPERITONEUM: No ascites or free air. Surgical clip deep to the umbilicus. LUNG BASE: Mild emphysematous changes in the lung bases. No evidence of nodule. LYMPH NODES: No lymphadenopathy. BONES AND SOFT TISSUES: Bilateral mild hip DJD. Mild lumbar levoscoliosis with early multilevel spondylotic change. No acute soft tissue abnormality. IMPRESSION: 1.  Enlarging infrarenal abdominal aortic aneurysm, now measuring 4.8 x 4.6 cm (previously 4.3 cm on 10/01/2022), without evidence of leak or impending rupture. According to the ACR 2013 and SVS 2018 guidelines, recommendation is 34-month interval surveillance imaging. 2. Extensive atheromatous plaque through the abdominal aorta and bilateral common iliac arteries without stenosis. 3. Moderate stenosis of the left renal artery due to calcified plaque, patent distally. No high-grade stenosis of the right renal artery. Electronically signed by: Katheleen Faes MD 11/20/2023 03:13 PM EDT RP Workstation: [id number]   MR CERVICAL SPINE WO CONTRAST Result Date: 11/17/2023 CLINICAL DATA:  Neck and bilateral upper extremity pain. Bilateral numbness. No known injury. EXAM: MRI CERVICAL SPINE WITHOUT CONTRAST TECHNIQUE: Multiplanar, multisequence MR imaging of the cervical spine was performed. No intravenous contrast was administered. COMPARISON:  None Available. FINDINGS: Alignment: Trace anterolisthesis C5 on C6 and straightening of normal cervical lordosis are seen. Vertebrae: No fracture, evidence of discitis, or bone lesion. Multilevel degenerative endplate signal change is seen. Cord: Normal signal throughout. Posterior Fossa, vertebral arteries, paraspinal tissues: Negative. Disc  levels: C2-3: Very shallow disc bulge and mild uncovertebral spurring without stenosis. C3-4: Advanced facet degenerative change on the left with an associated small facet joint effusion. There is a disc bulge, uncovertebral spurring and ligamentum flavum thickening, asymmetrically worse on the left. Mild flattening of the ventral cord and marked left foraminal narrowing are seen. There is mild to moderate right foraminal narrowing. C4-5: Disc bulge, bilateral facet degenerative change, ligamentum flavum thickening and uncovertebral spurring are seen. The ventral thecal sac is effaced. Moderate to severe foraminal narrowing is worse on the right.  C5-6: Left worse than right facet degenerative change, disc bulge and uncovertebral spurring are seen. There is also some ligamentum flavum thickening. Mild flattening of the ventral cord and marked bilateral foraminal narrowing are present. C6-7: There is a shallow disc bulge, left worse than right uncovertebral degenerative change and some facet arthropathy. There is mild flattening of the ventral cord. Moderate to severe foraminal narrowing is worse on the left. C7-T1: There is a shallow disc bulge and some uncovertebral disease. No stenosis. IMPRESSION: 1. Multilevel cervical spondylosis as described above. There is mild flattening of the ventral cord at C3-4, C5-6 and C6-7. 2. Marked left foraminal narrowing at C3-4, moderate to severe foraminal narrowing at C4-5 and C6-7 and marked bilateral foraminal narrowing at C5-6. Electronically Signed   By: Debby Prader M.D.   On: 11/17/2023 09:38   MR LUMBAR SPINE WO CONTRAST Result Date: 11/17/2023 CLINICAL DATA:  Low back pain with bilateral lower extremity pain and numbness. EXAM: MRI LUMBAR SPINE WITHOUT CONTRAST TECHNIQUE: Multiplanar, multisequence MR imaging of the lumbar spine was performed. No intravenous contrast was administered. COMPARISON:  Plain films lumbar spine 11/11/2018. PET CT scan 10/01/2022. FINDINGS: Segmentation:  Standard. Alignment: Mild convex left curvature with the apex at approximately L2-3. Trace degenerative retrolisthesis L2 on L3. Vertebrae:  No fracture, evidence of discitis, or bone lesion. Conus medullaris and cauda equina: Conus extends to the T12-L1 level. Conus and cauda equina appear normal. Paraspinal and other soft tissues: Abdominal aortic aneurysm is identified as seen on prior exams. Disc levels: T11-12 is imaged in the sagittal plane only. There is a very shallow disc bulge at this level but the central canal and foramina appear open. T12-L1: Negative. L1-2: Negative. L2-3: A shallow disc bulge and somewhat dorsal  epidural fat cause mild compression of the thecal sac. The foramina are open. L3-4: There is a broad-based disc bulge, right worse than left facet degenerative change and some ligamentum flavum thickening. Dorsal epidural fat is somewhat prominent. There is moderately severe compression of the thecal sac and narrowing of both lateral recesses. Mild to moderate foraminal narrowing is worse on the right. L4-5: Shallow broad-based disc bulge and mild facet degenerative change. There is mild central canal stenosis and bilateral foraminal narrowing. L5-S1: Shallow disc bulge is somewhat more prominent to the left. There is mild left foraminal narrowing. The central canal and right foramen are open. IMPRESSION: 1. Moderately severe compression of the thecal sac and narrowing of both lateral recesses at L3-4 due to a broad-based disc bulge, right worse than left facet degenerative change and ligamentum flavum thickening. Mild to moderate foraminal narrowing at this level is worse on the right. 2. Mild central canal stenosis at L2-3 and L4-5. 3. Mild left foraminal narrowing L5-S1. 4. Abdominal aortic aneurysm as seen on prior exams. Electronically Signed   By: Debby Prader M.D.   On: 11/17/2023 09:31   DG Lumbar Spine Complete Result Date: 11/16/2023  CLINICAL DATA:  Numbness, tingling and hyper-reflexia, chronic midline low-back pain without sciatica. Flexion-extension views of the cervical and lumbar spine requested. EXAM: LUMBAR SPINE - COMPLETE 4+ VIEW; CERVICAL SPINE - COMPLETE 4+ VIEW COMPARISON:  No prior cervical spine study. Comparison is made with CTA abdomen and pelvis 11/16/2021. FINDINGS: Four views of cervical spine with AP, lateral, and flexion extension lateral views: There is osteopenia with no evidence of fractures. No traumatic listhesis is suspected. There is moderately bulky facet hypertrophy C2-3 through C6-7. Multilevel uncinate spurring. No precervical soft tissue thickening is seen. There is  mild disc narrowing C3-4 and moderate disc space loss C4-5, C5-6, C6-7 and C7-T1. At C2-3 there is 3 mm anterolisthesis in flexion and neutral, which resolves with extension. At C3-4 there is 3 mm anterolisthesis in neutral, which increases to 4 mm in flexion and resolves with extension. At C4-5 there is 2 mm anterolisthesis in neutral and flexion, which resolves with extension. At C5-6 there is 3 mm anterolisthesis and neutral and flexion which resolves with extension. There are no further alignment abnormalities, no widening of the anterior atlantodental joint in any position. Lung apices are clear. There are heavy calcifications at the level of the carotid bifurcations. Lumbar spine four views with AP, neutral lateral, and flexion and extension lateral views: The right side marker was mistakenly placed on the left. There is mild levorotary lumbar scoliosis apex at L3. There is osteopenia with mild chronic anterior wedging of the T11 and 12 vertebral bodies. There are 5 lumbar type segments. The lumbar vertebra are normal in heights with no evidence of fractures. Moderate lumbar spondylosis. There is again noted mild disc space loss at L4-5 and L5-S1, normal disc heights above L4. There is moderate facet hypertrophy from L3-4 down, mild facet spurring L2-3. At L1-2, there is a grade 1 retrolisthesis measuring 4 mm in extension, 3 mm in neutral and 3 mm in flexion. At L2-3, there is grade 1 retrolisthesis measuring 4 mm in all 3 positions. At L3-4, grade 1 retrolisthesis measures 4 mm in extension, is 3 mm in neutral, 4 mm again in flexion. The sagittal alignment is otherwise normal. There is extensive aortoiliac calcific plaque. There is a large infrarenal fusiform to bulbous AAA, measuring maximum 6.3 cm AP and 5.7 cm transverse. Corresponding measurements in 2023 were 4.2 x 5.3 cm, respectively. The SI joints unremarkable, as visualized. IMPRESSION: 1. Osteopenia and degenerative change without evidence of  cervical or lumbar fractures. 2. Grade 1 anterolisthesis C2-3 through C5-6, with resolution in extension. 3. Grade 1 retrolisthesis L1-2, L2-3, and L3-4, with variation on at L1-2 and L3-4. 4. Mild levorotary lumbar scoliosis. 5. Large infrarenal fusiform to bulbous AAA, measuring 6.3 x 5.7 cm, increased from 4.2 x 5.3 cm in 2023. Recommend repeat CTA abdomen and pelvis and referral to a vascular specialist. 6. Aortic and carotid atherosclerosis. Electronically Signed   By: Francis Quam M.D.   On: 11/16/2023 21:07   DG Cervical Spine Complete Result Date: 11/16/2023 CLINICAL DATA:  Numbness, tingling and hyper-reflexia, chronic midline low-back pain without sciatica. Flexion-extension views of the cervical and lumbar spine requested. EXAM: LUMBAR SPINE - COMPLETE 4+ VIEW; CERVICAL SPINE - COMPLETE 4+ VIEW COMPARISON:  No prior cervical spine study. Comparison is made with CTA abdomen and pelvis 11/16/2021. FINDINGS: Four views of cervical spine with AP, lateral, and flexion extension lateral views: There is osteopenia with no evidence of fractures. No traumatic listhesis is suspected. There is moderately bulky facet hypertrophy C2-3  through C6-7. Multilevel uncinate spurring. No precervical soft tissue thickening is seen. There is mild disc narrowing C3-4 and moderate disc space loss C4-5, C5-6, C6-7 and C7-T1. At C2-3 there is 3 mm anterolisthesis in flexion and neutral, which resolves with extension. At C3-4 there is 3 mm anterolisthesis in neutral, which increases to 4 mm in flexion and resolves with extension. At C4-5 there is 2 mm anterolisthesis in neutral and flexion, which resolves with extension. At C5-6 there is 3 mm anterolisthesis and neutral and flexion which resolves with extension. There are no further alignment abnormalities, no widening of the anterior atlantodental joint in any position. Lung apices are clear. There are heavy calcifications at the level of the carotid bifurcations. Lumbar  spine four views with AP, neutral lateral, and flexion and extension lateral views: The right side marker was mistakenly placed on the left. There is mild levorotary lumbar scoliosis apex at L3. There is osteopenia with mild chronic anterior wedging of the T11 and 12 vertebral bodies. There are 5 lumbar type segments. The lumbar vertebra are normal in heights with no evidence of fractures. Moderate lumbar spondylosis. There is again noted mild disc space loss at L4-5 and L5-S1, normal disc heights above L4. There is moderate facet hypertrophy from L3-4 down, mild facet spurring L2-3. At L1-2, there is a grade 1 retrolisthesis measuring 4 mm in extension, 3 mm in neutral and 3 mm in flexion. At L2-3, there is grade 1 retrolisthesis measuring 4 mm in all 3 positions. At L3-4, grade 1 retrolisthesis measures 4 mm in extension, is 3 mm in neutral, 4 mm again in flexion. The sagittal alignment is otherwise normal. There is extensive aortoiliac calcific plaque. There is a large infrarenal fusiform to bulbous AAA, measuring maximum 6.3 cm AP and 5.7 cm transverse. Corresponding measurements in 2023 were 4.2 x 5.3 cm, respectively. The SI joints unremarkable, as visualized. IMPRESSION: 1. Osteopenia and degenerative change without evidence of cervical or lumbar fractures. 2. Grade 1 anterolisthesis C2-3 through C5-6, with resolution in extension. 3. Grade 1 retrolisthesis L1-2, L2-3, and L3-4, with variation on at L1-2 and L3-4. 4. Mild levorotary lumbar scoliosis. 5. Large infrarenal fusiform to bulbous AAA, measuring 6.3 x 5.7 cm, increased from 4.2 x 5.3 cm in 2023. Recommend repeat CTA abdomen and pelvis and referral to a vascular specialist. 6. Aortic and carotid atherosclerosis. Electronically Signed   By: Francis Quam M.D.   On: 11/16/2023 21:07    Assessment/Plan  No problem-specific Assessment & Plan notes found for this encounter.  Hypertension blood pressure control important in reducing the progression  of atherosclerotic disease. On appropriate oral medications.     Hypercholesterolemia lipid control important in reducing the progression of atherosclerotic disease. Continue statin therapy  Selinda Gu, MD  11/21/2023 11:54 AM    This note was created with Dragon medical transcription system.  Any errors from dictation are purely unintentional

## 2023-11-21 NOTE — Assessment & Plan Note (Signed)
 Swelling control seems significantly better.

## 2023-11-25 ENCOUNTER — Telehealth: Payer: Self-pay | Admitting: Physician Assistant

## 2023-11-25 NOTE — Telephone Encounter (Signed)
 He is not scheduled until 9/4 for the EMG. He wants to know better what is seen on the MRIs Cervical and Lumbar and xrays lumbar and cervical. He said he not sure what you discussed.  He is complaining of not gripping things. He is dropping everything. He says it's getting worse.

## 2023-11-25 NOTE — Telephone Encounter (Signed)
 Patient is calling to let our office know that since his last visit with Lyle his symptoms are getting progressively worse each day. He states that he is having a lot of issues using his hands and feet and feels like they are locking up. He also states he is having a lot of neck issues. He is concerned with waiting until his appointment to see Dr. Claudene on 12/15/2023. Please advise.

## 2023-11-26 NOTE — Telephone Encounter (Signed)
 Patient's voicemail is not setup. Per Scott [name], he should keep his current appointment with Dr. Claudene and we can move his appointment up if there is a cancellation.

## 2023-11-27 ENCOUNTER — Ambulatory Visit: Admitting: Neurology

## 2023-11-27 ENCOUNTER — Ambulatory Visit

## 2023-11-27 DIAGNOSIS — G5623 Lesion of ulnar nerve, bilateral upper limbs: Secondary | ICD-10-CM

## 2023-11-27 DIAGNOSIS — M5412 Radiculopathy, cervical region: Secondary | ICD-10-CM

## 2023-11-27 DIAGNOSIS — G5603 Carpal tunnel syndrome, bilateral upper limbs: Secondary | ICD-10-CM

## 2023-11-27 DIAGNOSIS — R202 Paresthesia of skin: Secondary | ICD-10-CM

## 2023-11-27 NOTE — Procedures (Signed)
 [facility]  [address] [place], [address]  [place], [place] [address] Tel: [phone] Fax: [phone] Test Date:  11/27/2023  Patient: Scott [name] DOB: Mar 11, 1961 Physician: Tonita Blanch, DO  Sex: Male Height: 5' 11 Ref Phys: Lyle Ulis RIGGERS  ID#: [id number]   Technician:    History: This is a 63 year old man referred for evaluation of bilateral upper extremity paresthesias and weakness.  NCV & EMG Findings: Extensive electrodiagnostic testing of the right upper extremity and additional studies of the left shows:  Right median and bilateral ulnar sensory responses show prolonged latency (R4.0, R3.5, L4.3 ms) and normal amplitude.  Left median sensory response shows prolonged latency (5.8 ms) and reduced amplitude (3.8 V).  Bilateral radial sensory responses are within normal limits. Right median motor response is within normal limits.  Left median motor response shows prolonged latency (L6.7 ms); of note, there is also evidence of a left Martin-Gruber anastomosis, a normal anatomic variant.  Bilateral ulnar motor responses show slowed conduction velocity across the elbow (A Elbow-B Elbow, R36, L40 m/s), with prolonged latency on the left (L4.7 ms).   Chronic motor axon loss changes are seen affecting bilateral pronator teres, biceps, triceps, deltoid, abductor digiti minimi, and flexor carpi ulnaris muscles, without accompanying active denervation.  Impression: This is a complex study.  Findings are as follows: Multilevel chronic cervical radiculopathies affecting bilateral C5, C6, and C7 nerve roots/segments, which is moderate in degree electrically. Left median neuropathy at or distal to the wrist (moderate-to-severe), consistent with a clinical diagnosis of carpal tunnel syndrome.   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 Right median neuropathy at or distal to the wrist (mild), consistent with a clinical diagnosis of  carpal tunnel syndrome.     ___________________________ Tonita Blanch, DO    Nerve Conduction Studies   Stim Site NR Peak (ms) Norm Peak (ms) O-P Amp (V) Norm O-P Amp  Left Median Anti Sensory (2nd Digit)  32 C  Wrist    *5.8 <3.8 *3.8 >10  Right Median Anti Sensory (2nd Digit)  32 C  Wrist    *4.0 <3.8 17.8 >10  Left Radial Anti Sensory (Base 1st Digit)  32 C  Wrist    2.8 <2.8 29.1 >10  Right Radial Anti Sensory (Base 1st Digit)  32 C  Wrist    2.1 <2.8 26.1 >10  Left Ulnar Anti Sensory (5th Digit)  32 C  Wrist    *4.3 <3.2 17.0 >5  Right Ulnar Anti Sensory (5th Digit)  32 C  Wrist    *3.5 <3.2 12.1 >5     Stim Site NR Onset (ms) Norm Onset (ms) O-P Amp (mV) Norm O-P Amp Site1 Site2 Delta-0 (ms) Dist (cm) Vel (m/s) Norm Vel (m/s)  Left Median Motor (Abd Poll Brev)  32 C  Wrist    *6.7 <4.0 5.4 >5 Elbow Wrist 6.8 35.0 51 >50  Elbow    13.5  7.1  Ulnar-wrist crossover Elbow 7.2 0.0    Ulnar-wrist crossover    6.3  4.2         Right Median Motor (Abd Poll Brev)  32 C  Wrist    3.9 <4.0 9.4 >5 Elbow Wrist 6.3 32.0 51 >50  Elbow    10.2  9.3         Left Ulnar Motor (Abd Dig Minimi)  32 C  Wrist    *4.7 <3.1 8.3 >7 B Elbow Wrist 4.6 24.0 52 >50  B Elbow    9.3  7.2  A Elbow B Elbow 2.5 10.0 *40 >50  A Elbow    11.8  6.5         Right Ulnar Motor (Abd Dig Minimi)  32 C  Wrist    2.9 <3.1 8.1 >7 B Elbow Wrist 4.1 23.0 56 >50  B Elbow    7.0  7.6  A Elbow B Elbow 2.8 10.0 *36 >50  A Elbow    9.8  7.3          Electromyography   Side Muscle Ins.Act Fibs Fasc Recrt Amp Dur Poly Activation Comment  Right 1stDorInt Nml Nml Nml Nml Nml Nml Nml Nml N/A  Right Abd Poll Brev Nml Nml Nml Nml Nml Nml Nml Nml N/A  Right PronatorTeres Nml Nml Nml *2- *1+ *1+ *1+ Nml N/A  Right Biceps Nml Nml Nml *2- *1+ *1+ *1+ Nml N/A  Right Triceps Nml Nml Nml *2- *1+ *1+ *1+ Nml N/A  Right Deltoid Nml Nml Nml *2- *1+ *1+ *1+ Nml N/A  Right Abd Dig Min Nml Nml Nml *2- *1+ *1+ *1+ Nml N/A   Right Ext Indicis Nml Nml Nml Nml Nml Nml Nml Nml N/A  Right FlexCarpiUln Nml Nml Nml *1- *1+ *1+ *1+ Nml N/A  Left Abd Poll Brev Nml Nml Nml Nml Nml Nml Nml Nml N/A  Left PronatorTeres Nml Nml Nml *2- *1+ *1+ *1+ Nml N/A  Left Biceps Nml Nml Nml *2- *1+ *1+ *1+ Nml N/A  Left Triceps Nml Nml Nml *2- *1+ *1+ *1+ Nml N/A  Left Deltoid Nml Nml Nml *2- *1+ *1+ *1+ Nml N/A  Left Abd Dig Min Nml Nml Nml *1- *1+ *1+ *1+ Nml N/A  Left FlexCarpiUln Nml Nml Nml *1- *1+ *1+ *1+ Nml N/A  Left 1stDorInt Nml Nml Nml Nml Nml Nml Nml Nml N/A      Waveforms:

## 2023-12-02 ENCOUNTER — Telehealth: Payer: Self-pay | Admitting: Neurology

## 2023-12-02 NOTE — Telephone Encounter (Signed)
 Patient will need to reach out to [place], [place]. Referring provider for results.   Called patient and informed him that he will need to contact [place] for results as they are the referring provider. Patient verbalized understanding and had no further questions or concerns.

## 2023-12-02 NOTE — Telephone Encounter (Signed)
 Pt. Needs results

## 2023-12-02 NOTE — Telephone Encounter (Signed)
 Patient called to check status of EMG results. He states he cannot access his MyChart. He also said he is in a bad way and would like to know his results prior to his OV on 8/25.  Please advise, thanks!

## 2023-12-09 NOTE — H&P (View-Only) (Signed)
 Referring Physician:  Everlene Parris LABOR, MD [address] [place],  [place] [address]  Primary Physician:  Everlene Parris LABOR, MD  History of Present Illness: 12/16/2023 Scott [name] has a history of aortic aneurysm, alcoholism, coronary artery disease, current DVT, COPD is here today for chief complaint of numbness and tingling in bilateral hands and feet.  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  He states that he is having numbness and tingling from his elbow down which goes into all 5 fingers.  He has previously been on high doses of gabapentin and Lyrica which has not relieved his pain.  He has neck pain and radiating pain into his arms and down his back with a Lhermitte's type phenomenon.  He does have a complicated history including alcoholism, neuropathy, loss of function in his hands, left-sided chronic foot pain and changes.  He has a significant vascular history and is followed by Dr. Marea.      Allergies  Allergen Reactions   Acetaminophen -Codeine Other (See Comments)    Nausea and hot flash    Chantix [Varenicline] Nausea Only    Sour taste in mouth   Codeine Nausea Only   Gabapentin Swelling   Pregabalin Swelling    Weakness: none Bowel/Bladder Dysfunction: none  Conservative measures:  Physical therapy:  has not participated in PT Multimodal medical therapy including regular antiinflammatories: Tramadol   Injections:  no epidural steroid injections  Past Surgery: none  Scott [name] has symptoms of cervical myelopathy.  The symptoms are causing a significant impact on the patient's life.   Review of Systems:  A 10 point review of systems is negative, except for the pertinent positives and negatives detailed in the HPI.  Past Medical History: Past Medical History:  Diagnosis Date   AAA (abdominal aortic aneurysm) without rupture (HCC) 02/23/2020   a.) AAA duplex 02/23/2020: 3.8 cm; b.) AAA duplex 11/28/2020: 3.7 cm; c.) CTA  AO + BIFEM 12/18/2020: 3.8 x 3.4 cm; d.) AAA duplex 11/07/2021: 3.7 cm; e.) CTA AO + BIFEM 11/16/2021: 4.2 cm; f.) AAA duplex 05/28/2022: 4.0 cm; g.) AAA duplex 11/26/2022: 4.4 cm   Alcoholism (HCC)    Anginal pain (HCC)    Anxiety    Aortic atherosclerosis (HCC)    Arthritis    Bilateral carotid artery disease (HCC) 06/20/2020   a.) carotid doppler 06/20/2020: 1-39% RICA, 40-59% LICA; b.) carotid doppler 11/28/2020, 11/26/2022: 1-39% BICA   Bilateral inguinal hernia    CAD (coronary artery disease) 06/03/2017   a.) LHC 06/03/2017: 25% pLAD - med mgmt; b.) LHC 08/22/2021: 25% pLAD, 15% o-mLM, 25% dLM-pLAD, 25% o-mLCx, 25% oD2 - med mgmt   COPD (chronic obstructive pulmonary disease) (HCC)    Coronary artery disease    COVID-19 2022   Deep vein thrombosis (DVT) of left lower extremity (HCC)    Depression    Dilatation of thoracic aorta (HCC) 09/17/2022   a.) CT chest 09/17/2022: 4.0 cm; b.) CT chest 01/02/2023: 3.9 cm   Diverticulosis    Dupuytren contracture of both hands    Dyspnea    GERD (gastroesophageal reflux disease)    Hepatic steatosis    HLD (hyperlipidemia)    Hypertension    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   a.) PET CT 10/01/2022: 6 mm with SUV max of 6.4; b.) s.p FNA 11/05/2022 --> pathology negative for malignancy (DDx oncocytosis vs oncocytoma)   On chronic clopidogrel  therapy    PAOD (peripheral arterial occlusive  disease) [facility])    a.) s/p BILATERAL endarterectomies 12/29/2020   Pre-diabetes    SVT (supraventricular tachycardia) (HCC)    a.) s/p ablation 02/28/2016        Past Surgical History: Past Surgical History:  Procedure Laterality Date   ANKLE SURGERY     bracelet   CHOLECYSTECTOMY  2000   COLONOSCOPY     ENDARTERECTOMY FEMORAL Bilateral 12/29/2020   Procedure: ENDARTERECTOMY FEMORAL;  Surgeon: Marea Selinda RAMAN, MD;  Location: [facility] ORS;  Service: Vascular;  Laterality: Bilateral;  Left SFA and Tibial intervention   INSERTION OF MESH  Bilateral 02/12/2023   Procedure: INSERTION OF MESH;  Surgeon: Rodolph Romano, MD;  Location: [facility] ORS;  Service: General;  Laterality: Bilateral;   LEFT HEART CATH AND CORONARY ANGIOGRAPHY Left 06/03/2017   Procedure: LEFT HEART CATH AND CORONARY ANGIOGRAPHY;  Surgeon: Florencio Cara BIRCH, MD;  Location: [facility] IN[facility];  Service: Cardiovascular;  Laterality: Left;   LEFT HEART CATH AND CORONARY ANGIOGRAPHY N/A 08/22/2021   Procedure: LEFT HEART CATH AND CORONARY ANGIOGRAPHY;  Surgeon: Florencio Cara BIRCH, MD;  Location: [facility] IN[facility];  Service: Cardiovascular;  Laterality: N/A;   LOWER EXTREMITY ANGIOGRAPHY Left 02/03/2020   Procedure: LOWER EXTREMITY ANGIOGRAPHY;  Surgeon: Marea Selinda RAMAN, MD;  Location: [facility];  Service: Cardiovascular;  Laterality: Left;   SVT ABLATION N/A 02/28/2016   TONSILLECTOMY      Allergies: Allergies as of 12/15/2023 - Review Complete 12/15/2023  Allergen Reaction Noted   Acetaminophen -codeine Other (See Comments) 08/09/2013   Chantix [varenicline] Nausea Only 02/20/2016   Codeine Nausea Only 12/07/2014   Gabapentin Swelling 11/01/2022   Pregabalin Swelling 11/01/2022    Medications: Outpatient Encounter Medications as of 12/15/2023  Medication Sig   atorvastatin  (LIPITOR) 40 MG tablet Take 40 mg by mouth daily.   cholecalciferol (VITAMIN D3) 25 MCG (1000 UNIT) tablet Take 1,000 Units by mouth daily.   cloNIDine  (CATAPRES ) 0.1 MG tablet Take 0.2 mg by mouth 2 (two) times daily.   clopidogrel  (PLAVIX ) 75 MG tablet Take 75 mg by mouth daily.   furosemide (LASIX) 20 MG tablet Take 20 mg by mouth daily.   ipratropium-albuterol  (DUONEB) 0.5-2.5 (3) MG/3ML SOLN Inhale 3 mLs into the lungs every 6 (six) hours as needed.   lisinopril -hydrochlorothiazide  (ZESTORETIC ) 20-12.5 MG tablet Take 1 tablet by mouth 2 (two) times daily.   metoprolol  succinate (TOPROL -XL) 100 MG 24 hr tablet Take 100 mg by mouth daily. Take with or immediately  following a meal.   nicotine polacrilex (COMMIT) 4 MG lozenge SMARTSIG:1 Lozenge(s) By Mouth Every 2 Hours PRN   potassium chloride  SA (KLOR-CON  M) 20 MEQ tablet Take 2 tablets (40 mEq) today, then take 1 tablet (20 mEq) daily until surgery. Be sure to take dose on day of procedure. Follow up with PCP for repeat labs.   vitamin B-12 (CYANOCOBALAMIN ) 500 MCG tablet Take 500 mcg by mouth daily.   aspirin  EC 81 MG tablet Take 162 mg by mouth daily. Swallow whole. (Patient not taking: Reported on 12/15/2023)   budesonide-formoterol  (SYMBICORT) 160-4.5 MCG/ACT inhaler Inhale 2 puffs into the lungs. (Patient not taking: Reported on 12/15/2023)   [DISCONTINUED] albuterol  (VENTOLIN  HFA) 108 (90 Base) MCG/ACT inhaler Inhale 2 puffs into the lungs every 6 (six) hours as needed.   [DISCONTINUED] amLODipine  (NORVASC ) 10 MG tablet Take 10 mg by mouth daily.   [DISCONTINUED] apixaban  (ELIQUIS ) 5 MG TABS tablet Take 5 mg by mouth 2 (two) times daily. (Patient not taking:  Reported on 11/21/2023)   [DISCONTINUED] buPROPion (WELLBUTRIN XL) 150 MG 24 hr tablet Take 150 mg by mouth daily. (Patient not taking: Reported on 11/21/2023)   [DISCONTINUED] famotidine  (PEPCID ) 20 MG tablet Take 20 mg by mouth 2 (two) times daily as needed for heartburn or indigestion.   [DISCONTINUED] guaiFENesin  (MUCINEX ) 600 MG 12 hr tablet Take 600 mg by mouth 2 (two) times daily.   [DISCONTINUED] isosorbide  mononitrate (IMDUR ) 30 MG 24 hr tablet Take 30 mg by mouth daily as needed.   [DISCONTINUED] nortriptyline (PAMELOR) 25 MG capsule Take 25 mg by mouth at bedtime.   [DISCONTINUED] propranolol (INDERAL) 10 MG tablet Take 10 mg by mouth 3 (three) times daily as needed.   [DISCONTINUED] rizatriptan (MAXALT-MLT) 10 MG disintegrating tablet Take 10 mg by mouth as needed.   [DISCONTINUED] traMADol  (ULTRAM ) 50 MG tablet Take 50 mg by mouth every 6 (six) hours as needed.   No facility-administered encounter medications on file as of 12/15/2023.     Social History: Social History   Tobacco Use   Smoking status: Every Day    Current packs/day: 0.75    Types: Cigarettes   Smokeless tobacco: Never  Vaping Use   Vaping status: Never Used  Substance Use Topics   Alcohol  use: Yes    Comment: 3-4 beers per day   Drug use: No    ppd  6-8     Family Medical History: Family History  Problem Relation Age of Onset   Diabetes Mother    Hypertension Mother    Lupus Mother    Heart attack Father    Cancer Brother    Cancer Brother     Physical Examination: Today's Vitals   12/15/23 1300  BP: (!) 178/116  Weight: 156 lb 6 oz (70.9 kg)  Height: 5' 11 (1.803 m)  PainSc: 9   PainLoc: Back   Body mass index is 21.81 kg/m.   NEUROLOGICAL:     Awake, alert, oriented to person, place, and time.  Speech is clear and fluent. Fund of knowledge is appropriate.   Cranial Nerves: Pupils equal round and reactive to light.  Facial tone is symmetric.    ROM of spine: No tenderness palpation, but does have a positive Lhermitte's phenomenon.   Strength: Side Biceps Triceps Deltoid Interossei Grip Wrist Ext. Wrist Flex.  R 4 4 4  1-2 2 4 4   L 4 4 4  1-2 2 4 4        + hoffman sign, 3+ reflexes in bilateral upper extremities. + cross adductor, 3+ patella and achilles.  Clonus present right worse than left Patient has difficulty with ambulation.  Unable to walk in tandem.  Medical Decision Making  Imaging: Narrative & Impression  CLINICAL DATA:  Neck and bilateral upper extremity pain. Bilateral numbness. No known injury.   EXAM: MRI CERVICAL SPINE WITHOUT CONTRAST   TECHNIQUE: Multiplanar, multisequence MR imaging of the cervical spine was performed. No intravenous contrast was administered.   COMPARISON:  None Available.   FINDINGS: Alignment: Trace anterolisthesis C5 on C6 and straightening of normal cervical lordosis are seen.   Vertebrae: No fracture, evidence of discitis, or bone lesion. Multilevel  degenerative endplate signal change is seen.   Cord: Normal signal throughout.   Posterior Fossa, vertebral arteries, paraspinal tissues: Negative.   Disc levels:   C2-3: Very shallow disc bulge and mild uncovertebral spurring without stenosis.   C3-4: Advanced facet degenerative change on the left with an associated small facet joint effusion. There is  a disc bulge, uncovertebral spurring and ligamentum flavum thickening, asymmetrically worse on the left. Mild flattening of the ventral cord and marked left foraminal narrowing are seen. There is mild to moderate right foraminal narrowing.   C4-5: Disc bulge, bilateral facet degenerative change, ligamentum flavum thickening and uncovertebral spurring are seen. The ventral thecal sac is effaced. Moderate to severe foraminal narrowing is worse on the right.   C5-6: Left worse than right facet degenerative change, disc bulge and uncovertebral spurring are seen. There is also some ligamentum flavum thickening. Mild flattening of the ventral cord and marked bilateral foraminal narrowing are present.   C6-7: There is a shallow disc bulge, left worse than right uncovertebral degenerative change and some facet arthropathy. There is mild flattening of the ventral cord. Moderate to severe foraminal narrowing is worse on the left.   C7-T1: There is a shallow disc bulge and some uncovertebral disease. No stenosis.   IMPRESSION: 1. Multilevel cervical spondylosis as described above. There is mild flattening of the ventral cord at C3-4, C5-6 and C6-7. 2. Marked left foraminal narrowing at C3-4, moderate to severe foraminal narrowing at C4-5 and C6-7 and marked bilateral foraminal narrowing at C5-6.     Electronically Signed   By: Debby Prader M.D.   On: 11/17/2023 09:38   Narrative & Impression  CLINICAL DATA:  Numbness, tingling and hyper-reflexia, chronic midline low-back pain without sciatica. Flexion-extension views of the  cervical and lumbar spine requested.   EXAM: LUMBAR SPINE - COMPLETE 4+ VIEW; CERVICAL SPINE - COMPLETE 4+ VIEW   COMPARISON:  No prior cervical spine study. Comparison is made with CTA abdomen and pelvis 11/16/2021.   FINDINGS: Four views of cervical spine with AP, lateral, and flexion extension lateral views:   There is osteopenia with no evidence of fractures. No traumatic listhesis is suspected.   There is moderately bulky facet hypertrophy C2-3 through C6-7. Multilevel uncinate spurring.   No precervical soft tissue thickening is seen. There is mild disc narrowing C3-4 and moderate disc space loss C4-5, C5-6, C6-7 and C7-T1.   At C2-3 there is 3 mm anterolisthesis in flexion and neutral, which resolves with extension.   At C3-4 there is 3 mm anterolisthesis in neutral, which increases to 4 mm in flexion and resolves with extension.   At C4-5 there is 2 mm anterolisthesis in neutral and flexion, which resolves with extension.   At C5-6 there is 3 mm anterolisthesis and neutral and flexion which resolves with extension.   There are no further alignment abnormalities, no widening of the anterior atlantodental joint in any position.   Lung apices are clear. There are heavy calcifications at the level of the carotid bifurcations.   Lumbar spine four views with AP, neutral lateral, and flexion and extension lateral views:   The right side marker was mistakenly placed on the left. There is mild levorotary lumbar scoliosis apex at L3.   There is osteopenia with mild chronic anterior wedging of the T11 and 12 vertebral bodies. There are 5 lumbar type segments.   The lumbar vertebra are normal in heights with no evidence of fractures. Moderate lumbar spondylosis.   There is again noted mild disc space loss at L4-5 and L5-S1, normal disc heights above L4.   There is moderate facet hypertrophy from L3-4 down, mild facet spurring L2-3.   At L1-2, there is a grade 1  retrolisthesis measuring 4 mm in extension, 3 mm in neutral and 3 mm in flexion.   At  L2-3, there is grade 1 retrolisthesis measuring 4 mm in all 3 positions.   At L3-4, grade 1 retrolisthesis measures 4 mm in extension, is 3 mm in neutral, 4 mm again in flexion.   The sagittal alignment is otherwise normal. There is extensive aortoiliac calcific plaque.   There is a large infrarenal fusiform to bulbous AAA, measuring maximum 6.3 cm AP and 5.7 cm transverse. Corresponding measurements in 2023 were 4.2 x 5.3 cm, respectively.   The SI joints unremarkable, as visualized.   IMPRESSION: 1. Osteopenia and degenerative change without evidence of cervical or lumbar fractures. 2. Grade 1 anterolisthesis C2-3 through C5-6, with resolution in extension. 3. Grade 1 retrolisthesis L1-2, L2-3, and L3-4, with variation on at L1-2 and L3-4. 4. Mild levorotary lumbar scoliosis. 5. Large infrarenal fusiform to bulbous AAA, measuring 6.3 x 5.7 cm, increased from 4.2 x 5.3 cm in 2023. Recommend repeat CTA abdomen and pelvis and referral to a vascular specialist. 6. Aortic and carotid atherosclerosis.     Electronically Signed   By: Francis Quam M.D.   On: 11/16/2023 21:07     Assessment and Plan: Scott [name] is a pleasant 63 y.o. male has a history of aortic aneurysm, alcoholism, coronary artery disease, current DVT, COPD is here today for chief complaint of numbness and tingling in bilateral hands and feet.  He states that he is having numbness and tingling from his elbow down which goes into all 5 fingers.  He has previously been on high doses of gabapentin and Lyrica which has not relieved his pain.  He has progressive bilateral upper extremity weakness numbness tingling, development of spastic contractures and in his bilateral upper and lower extremities.  He has developed spasticity as well in his right lower extremity predominantly with intermittent clonus but this is also present on the  left to a lower degree.  His hands show a near complete loss of function.  We had a long discussion about his risk factors, including his current smoking and vascular risk factors, pulmonary risk factors, and cardiac risk factors.  I let him know that he is at very high risk for surgical intervention.  His most severe level was at the C3-4 and there is evidence of spondylolisthesis that reduces on flexion-extension.  This appears to be his most severe level.  There are some evidence that states that smoking does not significantly reduce the risk of fusion for single level or 2 level anterior cervical discectomy and fusion, and in the setting of his rapidly progressive myelopathy I feel he meets indications for cervical intervention.  Given his progressive cervical myelopathy and severe stenosis at C3-4 which worsens with flexion-extension I feel he would benefit from a C3-4 anterior cervical discectomy and fusion.  He also has moderate to severe carpal tunnel syndrome on the left, given his rapid loss of upper extremity function we could perform these procedures in tandem.  With the ultrasonic approach we could perform the carpal tunnel surgery so that he could still use his hands for physical therapy.  Penne MICAEL Sharps, MD Dept. of Neurosurgery

## 2023-12-09 NOTE — Progress Notes (Signed)
 Referring Physician:  Everlene Parris LABOR, MD [address] [place],  [place] [address]  Primary Physician:  Everlene Parris LABOR, MD  History of Present Illness: 12/16/2023 Scott [name] has a history of aortic aneurysm, alcoholism, coronary artery disease, current DVT, COPD is here today for chief complaint of numbness and tingling in bilateral hands and feet.  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  He states that he is having numbness and tingling from his elbow down which goes into all 5 fingers.  He has previously been on high doses of gabapentin and Lyrica which has not relieved his pain.  He has neck pain and radiating pain into his arms and down his back with a Lhermitte's type phenomenon.  He does have a complicated history including alcoholism, neuropathy, loss of function in his hands, left-sided chronic foot pain and changes.  He has a significant vascular history and is followed by Dr. Marea.      Allergies  Allergen Reactions   Acetaminophen -Codeine Other (See Comments)    Nausea and hot flash    Chantix [Varenicline] Nausea Only    Sour taste in mouth   Codeine Nausea Only   Gabapentin Swelling   Pregabalin Swelling    Weakness: none Bowel/Bladder Dysfunction: none  Conservative measures:  Physical therapy:  has not participated in PT Multimodal medical therapy including regular antiinflammatories: Tramadol   Injections:  no epidural steroid injections  Past Surgery: none  Scott [name] has symptoms of cervical myelopathy.  The symptoms are causing a significant impact on the patient's life.   Review of Systems:  A 10 point review of systems is negative, except for the pertinent positives and negatives detailed in the HPI.  Past Medical History: Past Medical History:  Diagnosis Date   AAA (abdominal aortic aneurysm) without rupture (HCC) 02/23/2020   a.) AAA duplex 02/23/2020: 3.8 cm; b.) AAA duplex 11/28/2020: 3.7 cm; c.) CTA  AO + BIFEM 12/18/2020: 3.8 x 3.4 cm; d.) AAA duplex 11/07/2021: 3.7 cm; e.) CTA AO + BIFEM 11/16/2021: 4.2 cm; f.) AAA duplex 05/28/2022: 4.0 cm; g.) AAA duplex 11/26/2022: 4.4 cm   Alcoholism (HCC)    Anginal pain (HCC)    Anxiety    Aortic atherosclerosis (HCC)    Arthritis    Bilateral carotid artery disease (HCC) 06/20/2020   a.) carotid doppler 06/20/2020: 1-39% RICA, 40-59% LICA; b.) carotid doppler 11/28/2020, 11/26/2022: 1-39% BICA   Bilateral inguinal hernia    CAD (coronary artery disease) 06/03/2017   a.) LHC 06/03/2017: 25% pLAD - med mgmt; b.) LHC 08/22/2021: 25% pLAD, 15% o-mLM, 25% dLM-pLAD, 25% o-mLCx, 25% oD2 - med mgmt   COPD (chronic obstructive pulmonary disease) (HCC)    Coronary artery disease    COVID-19 2022   Deep vein thrombosis (DVT) of left lower extremity (HCC)    Depression    Dilatation of thoracic aorta (HCC) 09/17/2022   a.) CT chest 09/17/2022: 4.0 cm; b.) CT chest 01/02/2023: 3.9 cm   Diverticulosis    Dupuytren contracture of both hands    Dyspnea    GERD (gastroesophageal reflux disease)    Hepatic steatosis    HLD (hyperlipidemia)    Hypertension    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   a.) PET CT 10/01/2022: 6 mm with SUV max of 6.4; b.) s.p FNA 11/05/2022 --> pathology negative for malignancy (DDx oncocytosis vs oncocytoma)   On chronic clopidogrel  therapy    PAOD (peripheral arterial occlusive  disease) [facility])    a.) s/p BILATERAL endarterectomies 12/29/2020   Pre-diabetes    SVT (supraventricular tachycardia) (HCC)    a.) s/p ablation 02/28/2016        Past Surgical History: Past Surgical History:  Procedure Laterality Date   ANKLE SURGERY     bracelet   CHOLECYSTECTOMY  2000   COLONOSCOPY     ENDARTERECTOMY FEMORAL Bilateral 12/29/2020   Procedure: ENDARTERECTOMY FEMORAL;  Surgeon: Marea Selinda RAMAN, MD;  Location: [facility] ORS;  Service: Vascular;  Laterality: Bilateral;  Left SFA and Tibial intervention   INSERTION OF MESH  Bilateral 02/12/2023   Procedure: INSERTION OF MESH;  Surgeon: Rodolph Romano, MD;  Location: [facility] ORS;  Service: General;  Laterality: Bilateral;   LEFT HEART CATH AND CORONARY ANGIOGRAPHY Left 06/03/2017   Procedure: LEFT HEART CATH AND CORONARY ANGIOGRAPHY;  Surgeon: Florencio Cara BIRCH, MD;  Location: [facility];  Service: Cardiovascular;  Laterality: Left;   LEFT HEART CATH AND CORONARY ANGIOGRAPHY N/A 08/22/2021   Procedure: LEFT HEART CATH AND CORONARY ANGIOGRAPHY;  Surgeon: Florencio Cara BIRCH, MD;  Location: [facility];  Service: Cardiovascular;  Laterality: N/A;   LOWER EXTREMITY ANGIOGRAPHY Left 02/03/2020   Procedure: LOWER EXTREMITY ANGIOGRAPHY;  Surgeon: Marea Selinda RAMAN, MD;  Location: [facility];  Service: Cardiovascular;  Laterality: Left;   SVT ABLATION N/A 02/28/2016   TONSILLECTOMY      Allergies: Allergies as of 12/15/2023 - Review Complete 12/15/2023  Allergen Reaction Noted   Acetaminophen -codeine Other (See Comments) 08/09/2013   Chantix [varenicline] Nausea Only 02/20/2016   Codeine Nausea Only 12/07/2014   Gabapentin Swelling 11/01/2022   Pregabalin Swelling 11/01/2022    Medications: Outpatient Encounter Medications as of 12/15/2023  Medication Sig   atorvastatin  (LIPITOR) 40 MG tablet Take 40 mg by mouth daily.   cholecalciferol (VITAMIN D3) 25 MCG (1000 UNIT) tablet Take 1,000 Units by mouth daily.   cloNIDine  (CATAPRES ) 0.1 MG tablet Take 0.2 mg by mouth 2 (two) times daily.   clopidogrel  (PLAVIX ) 75 MG tablet Take 75 mg by mouth daily.   furosemide (LASIX) 20 MG tablet Take 20 mg by mouth daily.   ipratropium-albuterol  (DUONEB) 0.5-2.5 (3) MG/3ML SOLN Inhale 3 mLs into the lungs every 6 (six) hours as needed.   lisinopril -hydrochlorothiazide  (ZESTORETIC ) 20-12.5 MG tablet Take 1 tablet by mouth 2 (two) times daily.   metoprolol  succinate (TOPROL -XL) 100 MG 24 hr tablet Take 100 mg by mouth daily. Take with or immediately  following a meal.   nicotine polacrilex (COMMIT) 4 MG lozenge SMARTSIG:1 Lozenge(s) By Mouth Every 2 Hours PRN   potassium chloride  SA (KLOR-CON  M) 20 MEQ tablet Take 2 tablets (40 mEq) today, then take 1 tablet (20 mEq) daily until surgery. Be sure to take dose on day of procedure. Follow up with PCP for repeat labs.   vitamin B-12 (CYANOCOBALAMIN ) 500 MCG tablet Take 500 mcg by mouth daily.   aspirin  EC 81 MG tablet Take 162 mg by mouth daily. Swallow whole. (Patient not taking: Reported on 12/15/2023)   budesonide-formoterol  (SYMBICORT) 160-4.5 MCG/ACT inhaler Inhale 2 puffs into the lungs. (Patient not taking: Reported on 12/15/2023)   [DISCONTINUED] albuterol  (VENTOLIN  HFA) 108 (90 Base) MCG/ACT inhaler Inhale 2 puffs into the lungs every 6 (six) hours as needed.   [DISCONTINUED] amLODipine  (NORVASC ) 10 MG tablet Take 10 mg by mouth daily.   [DISCONTINUED] apixaban  (ELIQUIS ) 5 MG TABS tablet Take 5 mg by mouth 2 (two) times daily. (Patient not taking:  Reported on 11/21/2023)   [DISCONTINUED] buPROPion (WELLBUTRIN XL) 150 MG 24 hr tablet Take 150 mg by mouth daily. (Patient not taking: Reported on 11/21/2023)   [DISCONTINUED] famotidine  (PEPCID ) 20 MG tablet Take 20 mg by mouth 2 (two) times daily as needed for heartburn or indigestion.   [DISCONTINUED] guaiFENesin  (MUCINEX ) 600 MG 12 hr tablet Take 600 mg by mouth 2 (two) times daily.   [DISCONTINUED] isosorbide  mononitrate (IMDUR ) 30 MG 24 hr tablet Take 30 mg by mouth daily as needed.   [DISCONTINUED] nortriptyline (PAMELOR) 25 MG capsule Take 25 mg by mouth at bedtime.   [DISCONTINUED] propranolol (INDERAL) 10 MG tablet Take 10 mg by mouth 3 (three) times daily as needed.   [DISCONTINUED] rizatriptan (MAXALT-MLT) 10 MG disintegrating tablet Take 10 mg by mouth as needed.   [DISCONTINUED] traMADol  (ULTRAM ) 50 MG tablet Take 50 mg by mouth every 6 (six) hours as needed.   No facility-administered encounter medications on file as of 12/15/2023.     Social History: Social History   Tobacco Use   Smoking status: Every Day    Current packs/day: 0.75    Types: Cigarettes   Smokeless tobacco: Never  Vaping Use   Vaping status: Never Used  Substance Use Topics   Alcohol  use: Yes    Comment: 3-4 beers per day   Drug use: No    ppd  6-8     Family Medical History: Family History  Problem Relation Age of Onset   Diabetes Mother    Hypertension Mother    Lupus Mother    Heart attack Father    Cancer Brother    Cancer Brother     Physical Examination: Today's Vitals   12/15/23 1300  BP: (!) 178/116  Weight: 156 lb 6 oz (70.9 kg)  Height: 5' 11 (1.803 m)  PainSc: 9   PainLoc: Back   Body mass index is 21.81 kg/m.   NEUROLOGICAL:     Awake, alert, oriented to person, place, and time.  Speech is clear and fluent. Fund of knowledge is appropriate.   Cranial Nerves: Pupils equal round and reactive to light.  Facial tone is symmetric.    ROM of spine: No tenderness palpation, but does have a positive Lhermitte's phenomenon.   Strength: Side Biceps Triceps Deltoid Interossei Grip Wrist Ext. Wrist Flex.  R 4 4 4  1-2 2 4 4   L 4 4 4  1-2 2 4 4        + hoffman sign, 3+ reflexes in bilateral upper extremities. + cross adductor, 3+ patella and achilles.  Clonus present right worse than left Patient has difficulty with ambulation.  Unable to walk in tandem.  Medical Decision Making  Imaging: Narrative & Impression  CLINICAL DATA:  Neck and bilateral upper extremity pain. Bilateral numbness. No known injury.   EXAM: MRI CERVICAL SPINE WITHOUT CONTRAST   TECHNIQUE: Multiplanar, multisequence MR imaging of the cervical spine was performed. No intravenous contrast was administered.   COMPARISON:  None Available.   FINDINGS: Alignment: Trace anterolisthesis C5 on C6 and straightening of normal cervical lordosis are seen.   Vertebrae: No fracture, evidence of discitis, or bone lesion. Multilevel  degenerative endplate signal change is seen.   Cord: Normal signal throughout.   Posterior Fossa, vertebral arteries, paraspinal tissues: Negative.   Disc levels:   C2-3: Very shallow disc bulge and mild uncovertebral spurring without stenosis.   C3-4: Advanced facet degenerative change on the left with an associated small facet joint effusion. There is  a disc bulge, uncovertebral spurring and ligamentum flavum thickening, asymmetrically worse on the left. Mild flattening of the ventral cord and marked left foraminal narrowing are seen. There is mild to moderate right foraminal narrowing.   C4-5: Disc bulge, bilateral facet degenerative change, ligamentum flavum thickening and uncovertebral spurring are seen. The ventral thecal sac is effaced. Moderate to severe foraminal narrowing is worse on the right.   C5-6: Left worse than right facet degenerative change, disc bulge and uncovertebral spurring are seen. There is also some ligamentum flavum thickening. Mild flattening of the ventral cord and marked bilateral foraminal narrowing are present.   C6-7: There is a shallow disc bulge, left worse than right uncovertebral degenerative change and some facet arthropathy. There is mild flattening of the ventral cord. Moderate to severe foraminal narrowing is worse on the left.   C7-T1: There is a shallow disc bulge and some uncovertebral disease. No stenosis.   IMPRESSION: 1. Multilevel cervical spondylosis as described above. There is mild flattening of the ventral cord at C3-4, C5-6 and C6-7. 2. Marked left foraminal narrowing at C3-4, moderate to severe foraminal narrowing at C4-5 and C6-7 and marked bilateral foraminal narrowing at C5-6.     Electronically Signed   By: Debby Prader M.D.   On: 11/17/2023 09:38   Narrative & Impression  CLINICAL DATA:  Numbness, tingling and hyper-reflexia, chronic midline low-back pain without sciatica. Flexion-extension views of the  cervical and lumbar spine requested.   EXAM: LUMBAR SPINE - COMPLETE 4+ VIEW; CERVICAL SPINE - COMPLETE 4+ VIEW   COMPARISON:  No prior cervical spine study. Comparison is made with CTA abdomen and pelvis 11/16/2021.   FINDINGS: Four views of cervical spine with AP, lateral, and flexion extension lateral views:   There is osteopenia with no evidence of fractures. No traumatic listhesis is suspected.   There is moderately bulky facet hypertrophy C2-3 through C6-7. Multilevel uncinate spurring.   No precervical soft tissue thickening is seen. There is mild disc narrowing C3-4 and moderate disc space loss C4-5, C5-6, C6-7 and C7-T1.   At C2-3 there is 3 mm anterolisthesis in flexion and neutral, which resolves with extension.   At C3-4 there is 3 mm anterolisthesis in neutral, which increases to 4 mm in flexion and resolves with extension.   At C4-5 there is 2 mm anterolisthesis in neutral and flexion, which resolves with extension.   At C5-6 there is 3 mm anterolisthesis and neutral and flexion which resolves with extension.   There are no further alignment abnormalities, no widening of the anterior atlantodental joint in any position.   Lung apices are clear. There are heavy calcifications at the level of the carotid bifurcations.   Lumbar spine four views with AP, neutral lateral, and flexion and extension lateral views:   The right side marker was mistakenly placed on the left. There is mild levorotary lumbar scoliosis apex at L3.   There is osteopenia with mild chronic anterior wedging of the T11 and 12 vertebral bodies. There are 5 lumbar type segments.   The lumbar vertebra are normal in heights with no evidence of fractures. Moderate lumbar spondylosis.   There is again noted mild disc space loss at L4-5 and L5-S1, normal disc heights above L4.   There is moderate facet hypertrophy from L3-4 down, mild facet spurring L2-3.   At L1-2, there is a grade 1  retrolisthesis measuring 4 mm in extension, 3 mm in neutral and 3 mm in flexion.   At  L2-3, there is grade 1 retrolisthesis measuring 4 mm in all 3 positions.   At L3-4, grade 1 retrolisthesis measures 4 mm in extension, is 3 mm in neutral, 4 mm again in flexion.   The sagittal alignment is otherwise normal. There is extensive aortoiliac calcific plaque.   There is a large infrarenal fusiform to bulbous AAA, measuring maximum 6.3 cm AP and 5.7 cm transverse. Corresponding measurements in 2023 were 4.2 x 5.3 cm, respectively.   The SI joints unremarkable, as visualized.   IMPRESSION: 1. Osteopenia and degenerative change without evidence of cervical or lumbar fractures. 2. Grade 1 anterolisthesis C2-3 through C5-6, with resolution in extension. 3. Grade 1 retrolisthesis L1-2, L2-3, and L3-4, with variation on at L1-2 and L3-4. 4. Mild levorotary lumbar scoliosis. 5. Large infrarenal fusiform to bulbous AAA, measuring 6.3 x 5.7 cm, increased from 4.2 x 5.3 cm in 2023. Recommend repeat CTA abdomen and pelvis and referral to a vascular specialist. 6. Aortic and carotid atherosclerosis.     Electronically Signed   By: Francis Quam M.D.   On: 11/16/2023 21:07     Assessment and Plan: Mr. Kloepfer is a pleasant 63 y.o. male has a history of aortic aneurysm, alcoholism, coronary artery disease, current DVT, COPD is here today for chief complaint of numbness and tingling in bilateral hands and feet.  He states that he is having numbness and tingling from his elbow down which goes into all 5 fingers.  He has previously been on high doses of gabapentin and Lyrica which has not relieved his pain.  He has progressive bilateral upper extremity weakness numbness tingling, development of spastic contractures and in his bilateral upper and lower extremities.  He has developed spasticity as well in his right lower extremity predominantly with intermittent clonus but this is also present on the  left to a lower degree.  His hands show a near complete loss of function.  We had a long discussion about his risk factors, including his current smoking and vascular risk factors, pulmonary risk factors, and cardiac risk factors.  I let him know that he is at very high risk for surgical intervention.  His most severe level was at the C3-4 and there is evidence of spondylolisthesis that reduces on flexion-extension.  This appears to be his most severe level.  There are some evidence that states that smoking does not significantly reduce the risk of fusion for single level or 2 level anterior cervical discectomy and fusion, and in the setting of his rapidly progressive myelopathy I feel he meets indications for cervical intervention.  Given his progressive cervical myelopathy and severe stenosis at C3-4 which worsens with flexion-extension I feel he would benefit from a C3-4 anterior cervical discectomy and fusion.  He also has moderate to severe carpal tunnel syndrome on the left, given his rapid loss of upper extremity function we could perform these procedures in tandem.  With the ultrasonic approach we could perform the carpal tunnel surgery so that he could still use his hands for physical therapy.  Penne MICAEL Sharps, MD Dept. of Neurosurgery

## 2023-12-15 ENCOUNTER — Ambulatory Visit (INDEPENDENT_AMBULATORY_CARE_PROVIDER_SITE_OTHER): Admitting: Neurosurgery

## 2023-12-15 ENCOUNTER — Encounter: Payer: Self-pay | Admitting: Neurosurgery

## 2023-12-15 VITALS — BP 178/116 | Ht 71.0 in | Wt 156.4 lb

## 2023-12-15 DIAGNOSIS — M4802 Spinal stenosis, cervical region: Secondary | ICD-10-CM

## 2023-12-15 DIAGNOSIS — G992 Myelopathy in diseases classified elsewhere: Secondary | ICD-10-CM | POA: Diagnosis not present

## 2023-12-15 DIAGNOSIS — G5602 Carpal tunnel syndrome, left upper limb: Secondary | ICD-10-CM

## 2023-12-15 DIAGNOSIS — M4312 Spondylolisthesis, cervical region: Secondary | ICD-10-CM

## 2023-12-16 ENCOUNTER — Encounter: Payer: Self-pay | Admitting: Acute Care

## 2023-12-17 ENCOUNTER — Telehealth: Payer: Self-pay | Admitting: Neurosurgery

## 2023-12-17 NOTE — Telephone Encounter (Signed)
 Patient left the below message with the answering service. He called in this morning to see if Dr. Claudene has consulted with Dr. Clois about his next steps.     Media Information  Document Information  AMB Correspondence  NEUROSURGERY ANSWERING SERVICE CALL  12/16/2023 09:40  Attached To:  Scott [name]  Source Information  Default, Provider, MD

## 2023-12-18 NOTE — Telephone Encounter (Signed)
 Patient is calling back to follow up on his next steps. He was under the impression he would receive a call on Tuesday of this week.

## 2023-12-19 NOTE — Telephone Encounter (Signed)
 Patient notified and expressed understanding.

## 2023-12-21 DIAGNOSIS — G5602 Carpal tunnel syndrome, left upper limb: Secondary | ICD-10-CM | POA: Insufficient documentation

## 2023-12-21 DIAGNOSIS — M4312 Spondylolisthesis, cervical region: Secondary | ICD-10-CM | POA: Insufficient documentation

## 2023-12-21 DIAGNOSIS — G992 Myelopathy in diseases classified elsewhere: Secondary | ICD-10-CM | POA: Insufficient documentation

## 2023-12-23 ENCOUNTER — Telehealth: Payer: Self-pay

## 2023-12-23 DIAGNOSIS — M4312 Spondylolisthesis, cervical region: Secondary | ICD-10-CM

## 2023-12-23 DIAGNOSIS — G992 Myelopathy in diseases classified elsewhere: Secondary | ICD-10-CM

## 2023-12-23 DIAGNOSIS — G5602 Carpal tunnel syndrome, left upper limb: Secondary | ICD-10-CM

## 2023-12-23 NOTE — Telephone Encounter (Signed)
 Planned surgery: C3-4 anterior cervical discectomy and fusion, left-sided carpal tunnel release with ultrasound guidance    Surgery date: 01/09/24 at [facility] (Medical Mall: [address], [place], [place] [address]) - you will find out your arrival time the business day before your surgery.   Pre-op appointment at [facility] Pre-admit Testing: you will receive a call with a date/time for this appointment. If you are scheduled for an in person appointment, Pre-admit Testing is located on the first floor of the [facility] building, [address], [address]. During this appointment, they will advise you which medications you can take the morning of surgery, and which medications you will need to hold for surgery. Labs (such as blood work, EKG) may be done at your pre-op appointment. You are not required to fast for these labs. Should you need to change your pre-op appointment, please call Pre-admit testing at [phone].    Blood thinners:   Aspirin  81mg :  OK to stay on aspirin  81mg  daily for surgery  Plavix :     Stop Plavix  7 days prior, resume Plavix  14 days after     Surgical clearance: we will send a clearance form to Dr [facility] (Cardiology) and Dr Everlene (Primary care). They may wish to see you in their office prior to signing the clearance form. If so, they may call you to schedule an appointment.      NSAIDS (Non-steroidal anti-inflammatory drugs): because you are having a fusion, please avoid taking any NSAIDS (examples: ibuprofen , motrin , aleve , naproxen , meloxicam, diclofenac) for 3 months after surgery. Celebrex is an exception and is OK to take, if prescribed. Tylenol  is not an NSAID.    Common restrictions after spine surgery: No bending, lifting, or twisting ("BLT"). Avoid lifting objects heavier than 10 pounds for the first 6 weeks after surgery. Where possible, avoid household activities that involve lifting, bending, reaching,  pushing, or pulling such as laundry, vacuuming, grocery shopping, and childcare. Try to arrange for help from friends and family for these activities while you heal. Do not drive while taking prescription pain medication. Weeks 6 through 12 after surgery: avoid lifting more than 25 pounds.    X-rays after surgery: Because you are having a fusion or arthroplasty: for appointments after your 2 week follow-up: please arrive at the [facility] outpatient imaging center ([address], [address], [place]) or [employer] one hour prior to your appointment for x-rays. This applies to every appointment after your 2 week follow-up. Failure to do so may result in your appointment being rescheduled. *We recently started construction to have x-ray in our office. This may be completed by the time you come in for your 6 week post-op appointment. Please check with us  closer to that time to see if you can have your x-rays at our office*    How to contact us :  If you have any questions/concerns before or after surgery, you can reach us  at [phone], or you can send a mychart message. We can be reached by phone or mychart 8am-4pm, Monday-Friday.  *Please note: Calls after 4pm are forwarded to a third party answering service. Mychart messages are not routinely monitored during evenings, weekends, and holidays. Please call our office to contact the answering service for urgent concerns during non-business hours.    If you have FMLA/disability paperwork, please drop it off or fax it to [phone]   Appointments/FMLA & disability paperwork: [employer] Registered Nurse/Surgery scheduler: Verlie Hellenbrand, RN Certified  Medical Assistants: Don, CMA, Elenor, CMA, & Damien, [place] Physician Assistants: Lyle Decamp, PA-C, Edsel Goods, PA-C & Glade Boys, PA-C Surgeons: Penne Sharps, MD & Reeves Daisy, MD   [facility] PREADMIT TESTING VISIT and SURGERY  INFORMATION SHEET   Now that surgery has been scheduled you can anticipate several phone calls from [facility] services. A pharmacy technician will call you to verify your current list of medications taken at home.               The Pre-Service Center will call to verify your insurance information and to give you billing estimates and information.             The Preadmit Testing Office will be calling to schedule a visit to obtain information for the anesthesia team and provide instructions on preparation for surgery.  What can you expect for the Preadmit Testing Visit: Appointments may be scheduled in-person or by telephone.  If a telephone visit is scheduled, you may be asked to come into the office to have lab tests or other studies performed.   This visit will not be completed any greater than 14 days prior to your surgery.  If your surgery has been scheduled for a future date, please do not be alarmed if we have not contacted you to schedule an appointment more than a month prior to the surgery date.    Please be prepared to provide the following information during this appointment:            -Personal medical history                                               -Medication and allergy list            -Any history of problems with anesthesia              -Recent lab work or diagnostic studies            -Please notify us  of any needs we should be aware of to provide the best care possible           -You will be provided with instructions on how to prepare for your surgery.    On The Day of Surgery:  You must have a driver to take you home after surgery, you will be asked not to drive for 24 hours following surgery.  Taxi, Gisele and non-medical transport will not be acceptable means of transportation unless you have a responsible individual who will be traveling with you.  Visitors in the surgical area:   2 people will be able to visit you in your room once your preparation for surgery  has been completed. During surgery, your visitors will be asked to wait in the Surgery Waiting Area.  It is not a requirement for them to stay, if they prefer to leave and come back.  Your visitor(s) will be given an update once the surgery has been completed.  No visitors are allowed in the initial recovery room to respect patient privacy and safety.  Once you are more awake and transfer to the secondary recovery area, or are transferred to an inpatient room, visitors will again be able to see you.  To respect and protect your privacy: We will ask on the day of surgery who your driver will be  and what the contact number for that individual will be. We will ask if it is okay to share information with this individual, or if there is an alternative individual that we, or the surgeon, should contact to provide updates and information. If family or friends come to the surgical information desk requesting information about you, who you have not listed with us , no information will be given.   It may be helpful to designate someone as the main contact who will be responsible for updating your other friends and family.    PREADMIT TESTING OFFICE: [phone] SAME DAY SURGERY: [phone] We look forward to caring for you before and throughout the process of your surgery.

## 2023-12-23 NOTE — Telephone Encounter (Signed)
-----   Message from Penne LELON Sharps sent at 12/23/2023  7:22 AM EDT ----- Regarding: RE: book acdf and carpal tunnel Neurosurgery Booking Request - Dr. Penne Sharps  Patient Name: Scott [name] DOB: 1960-09-13 MRN: [id number] Sex: male Phone Number(s): [phone] (home)  Interpreter Needed: No Language: English  Surgeon: Penne Sharps, MD Assist: Per Protocol Preop Diagnosis: Cervical Myelopathy Requested Surgery Date/Time: Per KJ  Surgery Location: [facility] Patient Class: Outpatient Anesthesia Type: General  Requested Length of Surgery:Per Protocol  Laterality: N/A Positioning: Supine, Berchtold Surgical Procedure: C3-4 anterior cervical discectomy and fusion, left-sided carpal tunnel release with ultrasound guidance  Special Case Needs (equipment, implants, sets):  globus, IOM Brainlab: No Pathology Needed: No  Rep Needed: Yes Rep/Company Name: [employer] Indication for Consent: Cervical myelopathy, left-sided carpal tunnel syndrome CPT Codes: 77448, 20931, 77154, 35278 Monitoring Required: Yes Brace Required: No Type of Brace: No Order Faxed to [facility]:   [facility] Check List Clearance needed: Yes complex medical patient.  Likely needs cardiac and general medical clearance. Pre-admit Office Visit Date/Time:  Patient Notified:  Case Booked By:  Date Booked:  Orders Placed:  Epic Referral Created:  Auth Documented in Auth/Cert:  Notes on Schedule:  Documented/Highlighted on Calendar:  Booking Number: ----- Message ----- From: Sharps Penne LELON, MD Sent: 12/21/2023  11:31 PM EDT To: Penne LELON Sharps, MD Subject: book acdf and carpal tunnel

## 2023-12-23 NOTE — Telephone Encounter (Signed)
 I spoke with Scott [name]. He would like to have surgery on 01/09/24 and is interested in moving up to a sooner date if someone cancels.  He states he has not been taking aspirin  or plavix  lately; he took himself off of both medications due to bruising. He is considering restarting the aspirin  81mg . He is no longer on Eliquis  due to cost. I explained that I will be sending a clearance request to his cardiologist and primary care provider where I will mention this to them.  There are a few questions that we will need to address with Dr Claudene before putting his surgery on the schedule:  1) He would like to know if he can have the carpal tunnel surgery done on the right hand first instead of the left? He states the right is bothering him more than his left lately. *If we are going to switch to the right hand, will need an addendum to the notes because the note on 12/15/23 states left.  2) Does Dr Claudene have any recommendations to manage his symptoms until surgery?  3) He is scheduled to have some plantar warts cut out of his foot on 12/31/23. Is it OK to have this done prior to surgery/this close to his surgery date?  4) Will he still go home on 9/19 or stay for observation? (Surgery isn't starting until 1pm that day).

## 2023-12-24 ENCOUNTER — Other Ambulatory Visit: Payer: Self-pay

## 2023-12-24 DIAGNOSIS — G992 Myelopathy in diseases classified elsewhere: Secondary | ICD-10-CM

## 2023-12-24 DIAGNOSIS — M4312 Spondylolisthesis, cervical region: Secondary | ICD-10-CM

## 2023-12-24 DIAGNOSIS — Z01818 Encounter for other preprocedural examination: Secondary | ICD-10-CM

## 2023-12-24 DIAGNOSIS — G5602 Carpal tunnel syndrome, left upper limb: Secondary | ICD-10-CM

## 2023-12-24 NOTE — Addendum Note (Signed)
 Addended by: Sheryl Towell on: 12/24/2023 07:55 PM   Modules accepted: Orders

## 2023-12-24 NOTE — Telephone Encounter (Addendum)
 I spoke with Mr Scott [name] and relayed Dr Theressa responses. I also advised that I further discussed with Dr Claudene and we would like him to wait until after his 6 week post-op appointment to have the plantar wart procedure.  I attached brochures for the cervical fusion and a link to the carpal tunnel procedure in the mychart message I sent. However, he may stop by for paper copies of both patient brochures at any time.

## 2023-12-25 ENCOUNTER — Encounter: Admitting: Neurology

## 2023-12-29 ENCOUNTER — Ambulatory Visit (INDEPENDENT_AMBULATORY_CARE_PROVIDER_SITE_OTHER)

## 2023-12-29 VITALS — BP 172/102 | HR 82 | Ht 71.0 in | Wt 156.0 lb

## 2023-12-29 DIAGNOSIS — M79672 Pain in left foot: Secondary | ICD-10-CM | POA: Diagnosis not present

## 2023-12-29 DIAGNOSIS — Z789 Other specified health status: Secondary | ICD-10-CM | POA: Diagnosis not present

## 2023-12-29 DIAGNOSIS — F17210 Nicotine dependence, cigarettes, uncomplicated: Secondary | ICD-10-CM | POA: Diagnosis not present

## 2023-12-29 DIAGNOSIS — M79671 Pain in right foot: Secondary | ICD-10-CM | POA: Diagnosis not present

## 2023-12-29 DIAGNOSIS — I7143 Infrarenal abdominal aortic aneurysm, without rupture: Secondary | ICD-10-CM | POA: Diagnosis not present

## 2023-12-29 DIAGNOSIS — J449 Chronic obstructive pulmonary disease, unspecified: Secondary | ICD-10-CM | POA: Diagnosis not present

## 2023-12-29 DIAGNOSIS — I739 Peripheral vascular disease, unspecified: Secondary | ICD-10-CM | POA: Diagnosis not present

## 2023-12-29 NOTE — Progress Notes (Signed)
 +                                                                  Progress Note  Physician: Yaneli Keithley A Jaymes Revels, MD   HPI: Scott [name] is a 63 y.o. male presenting on 12/29/2023 for Medical Management of Chronic Issues (Concerned about dehydration, has upcoming operation ) .  Discussed the use of AI scribe software for clinical note transcription with the patient, who gave verbal consent to proceed.  History of Present Illness   Scott [name] is a 63 year old male with coronary artery disease and peripheral vascular disease who presents with worsening pain and numbness in his arms and hands.  Upper extremity pain and paresthesia - scheduled for C3-4 anterior cervical discectomy and fusion pending cardiology clearance - Severe pain and numbness in both arms and hands - Symptoms radiate from shoulders to hands - Significant limitation of movement due to symptoms - Symptoms are worsening daily - Significant concern regarding progression  Tobacco and alcohol  use - Smokes half a pack of cigarettes daily - Significant secondhand smoke exposure from sister in shared living space - Uses nicotine lozenges in attempt to reduce smoking - Consumes approximately six beers daily  Blood pressure control - Blood pressure typically well-controlled at home with Lasix, lisinopril , hydrochlorothiazide , and metoprolol  - Home blood pressure readings average 115/75 mmHg - Elevated blood pressure during visit attributed to pain and  stress  Plantar warts and mobility impairment - Bilateral plantar fasciitis causing significant pain - Impaired mobility due to foot pain - Has attempted soaking and use of pads for symptom relief - Limited mobility and lack of assistance hinder effective management - PVD limiting intervention  - Podiatry sees but appt was cancelled.  Needs help with toenails   Medical history:  Relevant past medical, surgical, family and social history reviewed and updated as indicated. Interim medical history since our last visit reviewed.  Allergies and medications reviewed and updated.   ROS: Negative unless specifically indicated above in HPI.  Current Outpatient Medications:    aspirin  EC 81 MG tablet, Take 162 mg by mouth daily. Swallow whole. (Patient taking differently: Take 81 mg by mouth daily. Swallow whole.), Disp: , Rfl:    atorvastatin  (LIPITOR) 40 MG tablet, Take 40 mg by mouth daily. (Patient taking differently: Take 20 mg by mouth daily.), Disp: , Rfl:    budesonide-formoterol  (SYMBICORT) 160-4.5 MCG/ACT inhaler, Inhale 2 puffs into the lungs., Disp: , Rfl:    cholecalciferol (VITAMIN D3) 25 MCG (1000 UNIT) tablet, Take 1,000 Units by mouth daily., Disp: , Rfl:    cloNIDine  (CATAPRES ) 0.1 MG tablet, Take 0.2 mg by mouth 2 (two) times daily., Disp: , Rfl:    furosemide (LASIX) 20 MG tablet, Take 20 mg by mouth daily. (Patient taking differently: Take 20 mg by mouth daily.), Disp: , Rfl:    ipratropium-albuterol  (DUONEB) 0.5-2.5 (3) MG/3ML SOLN, Inhale 3 mLs into the lungs every 6 (six) hours as needed., Disp: , Rfl:    lisinopril -hydrochlorothiazide  (ZESTORETIC ) 20-12.5 MG tablet, Take 1 tablet by mouth 2 (two) times daily., Disp: , Rfl:    metoprolol  succinate (TOPROL -XL) 100 MG 24 hr tablet, Take 100 mg by mouth daily. Take with or immediately following a meal., Disp: , Rfl:    nicotine polacrilex (COMMIT) 4 MG lozenge, SMARTSIG:1 Lozenge(s) By Mouth Every 2 Hours PRN, Disp: , Rfl:     potassium chloride  SA (KLOR-CON  M) 20 MEQ tablet, Take 2 tablets (40 mEq) today, then take 1 tablet (20 mEq) daily until surgery. Be sure to take dose on day of procedure. Follow up with PCP for repeat labs., Disp: 9 tablet, Rfl: 0   vitamin B-12 (CYANOCOBALAMIN ) 500 MCG tablet, Take 500 mcg by mouth daily., Disp: , Rfl:    clopidogrel  (PLAVIX ) 75 MG tablet, Take 75 mg by mouth daily. (Patient not taking: Reported on 12/29/2023), Disp: , Rfl:        Objective:     BP (!) 172/102 (BP Location: Left Arm, Patient Position: Sitting, Cuff Size: Normal)   Pulse 82   Ht 5' 11 (1.803 m)   Wt 156 lb (70.8 kg)   SpO2 96%   BMI 21.76 kg/m   Wt Readings from Last 3 Encounters:  12/29/23 156 lb (70.8 kg)  12/15/23 156 lb 6 oz (70.9 kg)  11/21/23 156 lb 9.6 oz (71 kg)    Physical Exam  Physical Exam Vitals reviewed.  Constitutional:      Appearance: Normal appearance. Well-developed with normal weight.  Cardiovascular:     Rate and Rhythm: Normal rate and regular rhythm. Normal heart sounds. Normal peripheral pulses Pulmonary:     Normal breath sounds with normal effort Skin:    General: Skin is warm and dry without noticeable rash. Neurological:     General: No focal deficit present.  Psychiatric:        Mood and Affect: Mood, behavior and cognition normal      Assessment & Plan:   Encounter Diagnoses  Name Primary?   Infrarenal abdominal aortic aneurysm (AAA) without rupture (HCC) Yes   Cigarette smoker    Chronic obstructive pulmonary disease, unspecified COPD type (HCC)    Daily consumption of alcohol     Peripheral vascular disease (HCC)    Pain in both feet     Orders Placed This Encounter  Procedures   AMB Referral [facility] Care Management     Assessment and Plan    Cervical spinal cord compression with pain and upper extremity weakness/numbness - C3-4 anterior  cervical discectomy and fusion  The neurosurgeon anticipates that surgery will alleviate pain, numbness,  and improve mobility. - Proceed with preoperative testing on Thursday - Schedule follow-up appointment post-surgery, pending cardio clearance  - He is generally high risk due to vasculopathy and active smoking/ETOH use  Peripheral vascular disease Peripheral vascular disease contributing to increased surgical risk and potential complications in healing. Smoking and alcohol  use exacerbate vascular issues. His vascular condition is being monitored by a vascular   Coronary artery disease Coronary artery disease present, increasing surgical risk. Blood pressure is generally well-controlled at home with medication, but elevated today due to pain and stress. Cardiology clearance is essential before proceeding with surgery. - Continue monitoring blood pressure at home - Ensure cardiology clearance before surgery  Hypertension Hypertension generally well-controlled at home with lisinopril , hydrochlorothiazide , and metoprolol . Elevated today due to pain and stress. Difficulty taking furosemide due to mobility issues, which affects his ability to reach the bathroom frequently. - Continue current antihypertensive regimen - Monitor blood pressure at home  Tobacco use disorder Continues to smoke half a pack per day, with significant secondhand smoke exposure from sister. Smoking increases surgical risk and exacerbates vascular disease. Previous attempts to quit with nicotine patches and Wellbutrin were unsuccessful. Nicotine patches cause skin blisters, and Chantix was not tolerated. - Encourage use of nicotine lozenges - Discuss potential use of nicotine gum or pouches - Consider discussing smoking cessation with sister  Alcohol  use disorder Consumes approximately six beers per day, contributing to increased vascular risk. Alcohol  use is a concern for surgical risk and overall vascular health.  Plantar warts  Podiatrist has advised against procedures due to vascular concerns. - Consider soaking feet  and using pumice stone - Explore options for assistance with foot care - Investigate options for home nursing assistance for foot care - Coordinate with social services to assess home care needs        Fu in 4 weeks post-op.  CMP prior to surgery ordered with ECG.  Labs missed due to internal issues

## 2023-12-30 ENCOUNTER — Telehealth: Payer: Self-pay

## 2023-12-30 NOTE — Telephone Encounter (Signed)
 Copied from CRM #[id number]. Topic: General - Call Back - No Documentation >> Dec 30, 2023  4:00 PM Dedra B wrote: Reason for CRM: Pt returning call. Doesn't know who called or what the call the was regarding.

## 2023-12-30 NOTE — Progress Notes (Unsigned)
 Complex Care Management Note Care Guide Note  12/30/2023 Name: GEARLD KERSTEIN MRN: [id number] DOB: Jan 22, 1961   Complex Care Management Outreach Attempts: An unsuccessful telephone outreach was attempted today to offer the patient information about available complex care management services.  Follow Up Plan:  Additional outreach attempts will be made to offer the patient complex care management information and services.   Encounter Outcome:  No Answer  Jeoffrey Buffalo , RMA     [place]  [facility], [facility] Guide  Direct Dial: [phone]  Website: [email or web address]

## 2023-12-31 ENCOUNTER — Ambulatory Visit: Admitting: Podiatry

## 2023-12-31 NOTE — Patient Instructions (Signed)
 Your procedure is scheduled on:01-09-24 Friday Report to the Registration Desk on the 1st floor of the [facility].Then proceed to the 2nd floor Surgery Desk To find out your arrival time, please call [phone] between 1PM - 3PM on:01-08-24 Thursday If your arrival time is 6:00 am, do not arrive before that time as the [facility] entrance doors do not open until 6:00 am.  REMEMBER: Instructions that are not followed completely may result in serious medical risk, up to and including death; or upon the discretion of your surgeon and anesthesiologist your surgery may need to be rescheduled.  Do not eat food after midnight the night before surgery.  No gum chewing or hard candies.  You may however, drink CLEAR liquids up to 2 hours before you are scheduled to arrive for your surgery. Do not drink anything within 2 hours of your scheduled arrival time.  Clear liquids include: - water   - apple juice without pulp - gatorade (not RED colors) - black coffee or tea (Do NOT add milk or creamers to the coffee or tea) Do NOT drink anything that is not on this list.  One week prior to surgery:Stop NOW (01-01-24) Stop ANY OVER THE COUNTER supplements until after surgery (Vitamin B12, Vitamin D3, Potassium)  Continue taking all of your other prescription medications up until the day of surgery.  ON THE DAY OF SURGERY ONLY TAKE THESE MEDICATIONS WITH SIPS OF WATER : -cloNIDine  (CATAPRES )  -metoprolol  succinate (TOPROL -XL)   Continue your 81 mg Aspirin  up until the day prior to surgery-Do NOT take the day of surgery  Use your Symbicort Inhaler and ipratropium-albuterol  (DUONEB) at home the morning of surgery  No Alcohol  for 24 hours before or after surgery.  No Smoking including e-cigarettes for 24 hours before surgery.  No chewable tobacco products for at least 6 hours before surgery.  No nicotine patches on the day of surgery.  Do not use any recreational drugs for at least a week  (preferably 2 weeks) before your surgery.  Please be advised that the combination of cocaine and anesthesia may have negative outcomes, up to and including death. If you test positive for cocaine, your surgery will be cancelled.  On the morning of surgery brush your teeth with toothpaste and water , you may rinse your mouth with mouthwash if you wish. Do not swallow any toothpaste or mouthwash.  Use CHG Soap as directed on instruction sheet.  Do not wear jewelry, make-up, hairpins, clips or nail polish.  For welded (permanent) jewelry: bracelets, anklets, waist bands, etc.  Please have this removed prior to surgery.  If it is not removed, there is a chance that hospital personnel will need to cut it off on the day of surgery.  Do not wear lotions, powders, or perfumes.   Do not shave body hair from the neck down 48 hours before surgery.  Contact lenses, hearing aids and dentures may not be worn into surgery.  Do not bring valuables to the hospital. [facility] is not responsible for any missing/lost belongings or valuables.   Notify your doctor if there is any change in your medical condition (cold, fever, infection).  Wear comfortable clothing (specific to your surgery type) to the hospital.  After surgery, you can help prevent lung complications by doing breathing exercises.  Take deep breaths and cough every 1-2 hours. Your doctor may order a device called an Incentive Spirometer to help you take deep breaths. When coughing or sneezing, hold a pillow firmly against  your incision with both hands. This is called "splinting." Doing this helps protect your incision. It also decreases belly discomfort.  If you are being admitted to the hospital overnight, leave your suitcase in the car. After surgery it may be brought to your room.  In case of increased patient census, it may be necessary for you, the patient, to continue your postoperative care in the Same Day Surgery department.  If  you are being discharged the day of surgery, you will not be allowed to drive home. You will need a responsible individual to drive you home and stay with you for 24 hours after surgery.   If you are taking public transportation, you will need to have a responsible individual with you.  Please call the Pre-admissions Testing Dept. at [phone] if you have any questions about these instructions.  Surgery Visitation Policy:  Patients having surgery or a procedure may have two visitors.  Children under the age of 109 must have an adult with them who is not the patient.  Inpatient Visitation:    Visiting hours are 7 a.m. to 8 p.m. Up to four visitors are allowed at one time in a patient room. The visitors may rotate out with other people during the day.  One visitor age 41 or older may stay with the patient overnight and must be in the room by 8 p.m.  Preparing the Skin Before Surgery  To help prevent the risk of infection at your surgical site, we are now providing you with rinse-free Sage 2% Chlorhexidine  Gluconate (CHG) disposable wipes.  Chlorhexidine  Gluconate (CHG) Wipes-Start using wipes on Monday 9-15 and use these wipes everyday with the last usage of wipes the morning of your surgery on 9-19 Friday  o An antiseptic cleaner that kills germs and bonds with the skin to continue killing germs even after washing  o Used for showering the night before surgery and morning of surgery  The night before surgery: Shower or bathe with warm water . Do not apply perfume, lotions, powders. Wait one hour after shower. Skin should be dry and cool. Open Sage wipe package - use 6 disposable cloths. Wipe body using one cloth for the right arm, one cloth for the left arm, one cloth for the right leg, one cloth for the left leg, one cloth for the chest/abdomen area, and one cloth for the back. Do not use on open wounds or sores. Do not use on face or genitals (private parts). If you are breast  feeding, do not use on breasts. 5. Do not rinse, allow to dry. 6. Skin may feel tacky for several minutes. 7. Dress in clean clothes. 8. Place clean sheets on your bed and do not sleep with pets.  REPEAT ABOVE ON THE MORNING OF SURGERY BEFORE ARRIVING TO THE HOSPITAL.   How to Use an Incentive Spirometer An incentive spirometer is a tool that measures how well you are filling your lungs with each breath. Learning to take long, deep breaths using this tool can help you keep your lungs clear and active. This may help to reverse or lessen your chance of developing breathing (pulmonary) problems, especially infection. You may be asked to use a spirometer: After a surgery. If you have a lung problem or a history of smoking. After a long period of time when you have been unable to move or be active. If the spirometer includes an indicator to show the highest number that you have reached, your health care provider or  respiratory therapist will help you set a goal. Keep a log of your progress as told by your health care provider. What are the risks? Breathing too quickly may cause dizziness or cause you to pass out. Take your time so you do not get dizzy or light-headed. If you are in pain, you may need to take pain medicine before doing incentive spirometry. It is harder to take a deep breath if you are having pain. How to use your incentive spirometer  Sit up on the edge of your bed or on a chair. Hold the incentive spirometer so that it is in an upright position. Before you use the spirometer, breathe out normally. Place the mouthpiece in your mouth. Make sure your lips are closed tightly around it. Breathe in slowly and as deeply as you can through your mouth, causing the piston or the ball to rise toward the top of the chamber. Hold your breath for 3-5 seconds, or for as long as possible. If the spirometer includes a coach indicator, use this to guide you in breathing. Slow down your  breathing if the indicator goes above the marked areas. Remove the mouthpiece from your mouth and breathe out normally. The piston or ball will return to the bottom of the chamber. Rest for a few seconds, then repeat the steps 10 or more times. Take your time and take a few normal breaths between deep breaths so that you do not get dizzy or light-headed. Do this every 1-2 hours when you are awake. If the spirometer includes a goal marker to show the highest number you have reached (best effort), use this as a goal to work toward during each repetition. After each set of 10 deep breaths, cough a few times. This will help to make sure that your lungs are clear. If you have an incision on your chest or abdomen from surgery, place a pillow or a rolled-up towel firmly against the incision when you cough. This can help to reduce pain while taking deep breaths and coughing. General tips When you are able to get out of bed: Walk around often. Continue to take deep breaths and cough in order to clear your lungs. Keep using the incentive spirometer until your health care provider says it is okay to stop using it. If you have been in the hospital, you may be told to keep using the spirometer at home. Contact a health care provider if: You are having difficulty using the spirometer. You have trouble using the spirometer as often as instructed. Your pain medicine is not giving enough relief for you to use the spirometer as told. You have a fever. Get help right away if: You develop shortness of breath. You develop a cough with bloody mucus from the lungs. You have fluid or blood coming from an incision site after you cough. Summary An incentive spirometer is a tool that can help you learn to take long, deep breaths to keep your lungs clear and active. You may be asked to use a spirometer after a surgery, if you have a lung problem or a history of smoking, or if you have been inactive for a long period of  time. Use your incentive spirometer as instructed every 1-2 hours while you are awake. If you have an incision on your chest or abdomen, place a pillow or a rolled-up towel firmly against your incision when you cough. This will help to reduce pain. Get help right away if you have shortness of breath,  you cough up bloody mucus, or blood comes from your incision when you cough. This information is not intended to replace advice given to you by your health care provider. Make sure you discuss any questions you have with your health care provider. Document Revised: 02/14/2023 Document Reviewed: 02/14/2023 Elsevier Patient Education  2024 [employer].  [employer] Directory to address health-related social needs:  [email or web address]

## 2023-12-31 NOTE — Progress Notes (Signed)
 Complex Care Management Note  Care Guide Note 12/31/2023 Name: REEDY BIERNAT MRN: [id number] DOB: [birth year]  BUBBA VANBENSCHOTEN is a 63 y.o. year old male who sees Zafirov, Parris LABOR, MD for primary care. I reached out to [name] by phone today to offer complex care management services.  Mr. Maniscalco was given information about Complex Care Management services today including:   The Complex Care Management services include support from the care team which includes your Nurse Care Manager, Clinical Social Worker, or Pharmacist.  The Complex Care Management team is here to help remove barriers to the health concerns and goals most important to you. Complex Care Management services are voluntary, and the patient may decline or stop services at any time by request to their care team member.   Complex Care Management Consent Status: Patient agreed to services and verbal consent obtained.   Follow up plan:  Telephone appointment with complex care management team member scheduled for:  [facility] 01/12/2024 BSW 01/13/2024  Encounter Outcome:  Patient Scheduled  Jeoffrey Buffalo , RMA     [place]  [facility], [facility] Guide  Direct Dial: [phone]  Website: [email or web address]

## 2024-01-01 ENCOUNTER — Other Ambulatory Visit: Payer: Self-pay

## 2024-01-01 ENCOUNTER — Encounter
Admission: RE | Admit: 2024-01-01 | Discharge: 2024-01-01 | Disposition: A | Discharge status: Home / Self Care | Source: Ambulatory Visit | Attending: Neurosurgery | Admitting: Neurosurgery

## 2024-01-01 ENCOUNTER — Ambulatory Visit

## 2024-01-01 VITALS — BP 142/84 | HR 67 | Resp 16

## 2024-01-01 DIAGNOSIS — Z01818 Encounter for other preprocedural examination: Secondary | ICD-10-CM | POA: Insufficient documentation

## 2024-01-01 DIAGNOSIS — G959 Disease of spinal cord, unspecified: Secondary | ICD-10-CM

## 2024-01-01 DIAGNOSIS — I6523 Occlusion and stenosis of bilateral carotid arteries: Secondary | ICD-10-CM | POA: Diagnosis not present

## 2024-01-01 DIAGNOSIS — J449 Chronic obstructive pulmonary disease, unspecified: Secondary | ICD-10-CM | POA: Diagnosis not present

## 2024-01-01 DIAGNOSIS — Z01812 Encounter for preprocedural laboratory examination: Secondary | ICD-10-CM

## 2024-01-01 DIAGNOSIS — I10 Essential (primary) hypertension: Secondary | ICD-10-CM | POA: Insufficient documentation

## 2024-01-01 DIAGNOSIS — I7143 Infrarenal abdominal aortic aneurysm, without rupture: Secondary | ICD-10-CM | POA: Diagnosis not present

## 2024-01-01 DIAGNOSIS — I25119 Atherosclerotic heart disease of native coronary artery with unspecified angina pectoris: Secondary | ICD-10-CM | POA: Insufficient documentation

## 2024-01-01 DIAGNOSIS — I471 Supraventricular tachycardia, unspecified: Secondary | ICD-10-CM | POA: Diagnosis not present

## 2024-01-01 DIAGNOSIS — Z01810 Encounter for preprocedural cardiovascular examination: Secondary | ICD-10-CM

## 2024-01-01 HISTORY — DX: Vitamin D deficiency, unspecified: E55.9

## 2024-01-01 HISTORY — DX: Presence of other vascular implants and grafts: Z95.828

## 2024-01-01 HISTORY — DX: Deficiency of other specified B group vitamins: E53.8

## 2024-01-01 HISTORY — DX: Other specified postprocedural states: Z98.890

## 2024-01-01 HISTORY — DX: Nicotine dependence, cigarettes, uncomplicated: F17.210

## 2024-01-01 HISTORY — DX: Other nonspecific abnormal finding of lung field: R91.8

## 2024-01-01 LAB — TYPE AND SCREEN
ABO/RH(D): A POS
Antibody Screen: NEGATIVE

## 2024-01-01 LAB — COMPREHENSIVE METABOLIC PANEL WITH GFR
ALT: 21 U/L (ref 0–44)
AST: 30 U/L (ref 15–41)
Albumin: 4.2 g/dL (ref 3.5–5.0)
Alkaline Phosphatase: 44 U/L (ref 38–126)
Anion gap: 15 (ref 5–15)
BUN: 6 mg/dL — ABNORMAL LOW (ref 8–23)
CO2: 25 mmol/L (ref 22–32)
Calcium: 9.3 mg/dL (ref 8.9–10.3)
Chloride: 92 mmol/L — ABNORMAL LOW (ref 98–111)
Creatinine, Ser: 0.45 mg/dL — ABNORMAL LOW (ref 0.61–1.24)
GFR, Estimated: >60 mL/min (ref >60)
Glucose, Bld: 89 mg/dL (ref 70–99)
Potassium: 3.4 mmol/L — ABNORMAL LOW (ref 3.5–5.1)
Sodium: 132 mmol/L — ABNORMAL LOW (ref 135–145)
Total Bilirubin: 1.1 mg/dL (ref 0.0–1.2)
Total Protein: 7.2 g/dL (ref 6.5–8.1)

## 2024-01-01 LAB — SURGICAL PCR SCREEN
MRSA, PCR: NEGATIVE
Staphylococcus aureus: NEGATIVE

## 2024-01-01 NOTE — Progress Notes (Signed)
 Pt states during anesthesia interview for upcoming neurosurgery with Dr Claudene that he is no longer taking his Plavix  and states he has been off Plavix  since the beginning of August 2025 due to bruising. Pt states he is taking his 81 mg Asa daily

## 2024-01-02 ENCOUNTER — Telehealth: Payer: Self-pay

## 2024-01-02 ENCOUNTER — Telehealth: Payer: Self-pay | Admitting: Neurosurgery

## 2024-01-02 ENCOUNTER — Other Ambulatory Visit

## 2024-01-02 NOTE — Telephone Encounter (Signed)
 I spoke to Mr Giambalvo and relayed Bryan's recommendations. I also informed him of Dr Theressa response and explained that if someone else cancels for next Tuesday or Thursday, I will call him to move him up.

## 2024-01-02 NOTE — Telephone Encounter (Signed)
 I contacted Scott [name] and offered to move his surgery from 01/09/24 to 01/08/24. He was agreeable to this and appreciative of the sooner surgery date. He is aware to shift all of his instructions and soap by one day.

## 2024-01-02 NOTE — Telephone Encounter (Signed)
 Patient called to let our office know that his lab work came back and he is concerned with some of his levels being low. He states his paperwork advises him to stop taking potassium today. He would like to know since his potassium is low, if he should still follow this or not. Patient is requesting for his surgery to be moved up if possible. He states that he does not have use of his hands and cannot move his left hand. Please advise.

## 2024-01-06 ENCOUNTER — Encounter: Payer: Self-pay | Admitting: Neurosurgery

## 2024-01-06 ENCOUNTER — Inpatient Hospital Stay: Admission: RE | Admit: 2024-01-06 | Source: Ambulatory Visit

## 2024-01-06 NOTE — Progress Notes (Signed)
 Perioperative / Anesthesia Services  Pre-Admission Testing Clinical Review / Pre-Operative Anesthesia Consult  Date: 01/06/24  PATIENT DEMOGRAPHICS: Name: Scott [name] DOB: 1961-02-16 MRN:   [id number]  Note: Available PAT nursing documentation and vital signs have been reviewed. Clinical nursing staff has updated patient's PMH/PSHx, current medication list, and drug allergies/intolerances to ensure complete and comprehensive history available to assist care teams in MDM as it pertains to the aforementioned surgical procedure and anticipated anesthetic course. Extensive review of available clinical information personally performed. [place] [facility] and P[facility] updated with any diagnoses/procedures that  may have been inadvertently omitted during his intake with the pre-admission testing department's nursing staff.  PLANNED SURGICAL PROCEDURE(S):   Case: [id number] Date/Time: 01/08/24 0756   Procedures:      ANTERIOR CERVICAL DECOMPRESSION/DISCECTOMY FUSION 1 LEVEL - C3-4 ANTERIOR CERVICAL DISCECTOMY AND FUSION     CARPAL TUNNEL RELEASE - LEFT CARPAL TUNNEL RELEASE WITH ULTRASOUND GUIDANCE   Anesthesia type: General   Diagnosis:      Stenosis of cervical spine with myelopathy (HCC) [M48.02, G99.2]     Acquired spondylolisthesis of cervical vertebra [M43.12]     Carpal tunnel syndrome on left [G56.02]   Pre-op diagnosis:      M48.02, G99.2 Stenosis of cervical spine with myelopathy     M43.12 Acquired spondylolisthesis of cervical vertebra     G56.02 Carpal tunnel syndrome on left   Location: [facility] OR ROOM 03 / [facility]   Surgeons: Claudene Penne ORN, MD        CLINICAL DISCUSSION: Scott [name] is a 63 y.o. male who is submitted for pre-surgical anesthesia review and clearance prior to him undergoing the above procedure.  Patient is a Current Smoker. Pertinent PMH includes: CAD, diastolic dysfunction, AAA, aortic root dilatation, SVT (s/p ablation), DVT, PVD (s/p  BILATERAL femoral endarterectomies), BILATERAL carotid artery disease, chronic encephalomalacia (LEFT inferior temporal gyrus), aortic atherosclerosis, angina, ILBBB, HTN, HLD, prediabetes, dyspnea, COPD, pulmonary nodules, GERD (on daily H2 blocker), OA, carpal tunnel syndrome, cervical spinal stenosis with myelopathy, depression, anxiety, ETOH abuse.    Patient is followed by cardiology Philippe, MD). He was last seen in the cardiology clinic on 07/31/2023; notes reviewed. At the time of his clinic visit, patient doing well overall from a cardiovascular perspective. Patient denied any chest pain, shortness of breath, PND, orthopnea, palpitations, significant peripheral edema, weakness, fatigue, vertiginous symptoms, or presyncope/syncope. Patient with a past medical history significant for cardiovascular diagnoses. Documented physical exam was grossly benign, providing no evidence of acute exacerbation and/or decompensation of the patient's known cardiovascular conditions.  Patient underwent diagnostic LEFT heart catheterization on 06/03/2017 revealing minor single-vessel CAD.  There was a 25% lesion noted in the proximal LAD.  Given the nonobstructive nature of his coronary artery disease, the decision was made to defer intervention opting for medical management.   Patient with a history of SVT.  Definitive treatment with ablation procedure was performed on 02/28/2016; no recurrence.   Most recent TTE performed on 12/11/2020 revealed a mildly reduced left ventricular systolic function with an EF of 45%.  Left ventricle mildly enlarged.  There was global hypokinesis.  Left ventricular diastolic Doppler parameters consistent with abnormal relaxation (G1DD). Right ventricular size and function normal. RVSP =36.1 mmHg.  There was trivial mitral, tricuspid, and pulmonary valve regurgitation.  All transvalvular gradients were noted to be normal providing no evidence of hemodynamically significant valvular  stenosis. There was mild dilation of the aortic root measuring up to 4.2  cm.  Most recent myocardial perfusion imaging study was performed on 12/11/2020 revealing a low normal left ventricular systolic function with an EF of 53%.  There were no regional wall motion abnormalities.  No artifact or left ventricular cavity size enlargement appreciated on review of imaging. SPECT images demonstrated a small mixed perfusion abnormality of mild intensity present in the inferior apical region on stress images. TID ratio = 0.83 (normal range </= 1.2).  There was no clear evidence of reversible ischemia.  Repeat cardiac catheterization was recommended for ongoing symptoms.  Repeat diagnostic LEFT heart catheterization was performed on 08/22/2021 revealing multivessel CAD; 25% proximal LAD, 15% ostial-mid LM, 25% LM to proximal LAD, 25% ostial-mid LCx, and 25% ostial OM2.  Again, given the nonobstructive nature of his coronary artery disease, intervention was deferred opting for continued medical management.   Patient with a known infrarenal AAA dating back to at least 02/2020, at which time aneurysmal defect measured 3.8 cm.  Since that time, defect has been serially monitored for progression.  PET/CT was performed on 10/01/2022 revealing interval increase in size to 4.3 cm.  Follow-up AAA duplex was performed on 11/26/2022 demonstrating minimal increase in size to 4.4 cm.  Most recent CTA imaging of the abdomen and pelvis on 11/20/2023 revealed further interval increase in size of aneurysmal defect of 4.8 x 4.6 cm.  Blood pressure reasonably controlled at 140/88 mmHg on currently prescribed alpha-blocker (clonidine ), ACEi/diuretic (lisinopril -HCTZ), and beta-blocker (metoprolol  succinate) therapies.  Patient is on atorvastatin  for his HLD diagnosis and ASCVD prevention.  Patient has a prediabetes diagnosis that he is managing with diet and lifestyle modifications.  Most recent hemoglobin A1c was 5.8% when checked on  10/30/2023.  Patient does not have an OSAH diagnosis. Patient is able to complete all of his  ADL/IADLs without cardiovascular limitation.  Per the [facility]AS[facility], patient is able to achieve at least 4 METS of physical activity without experiencing any significant degree of angina/anginal equivalent symptoms. No changes were made to his medication regimen during his visit with cardiology.  Patient scheduled to follow-up with outpatient cardiology in 6 months or sooner if needed.  Scott [name] is scheduled for an elective ANTERIOR CERVICAL DECOMPRESSION/DISCECTOMY FUSION 1 LEVEL; CARPAL TUNNEL RELEASE on 01/09/2024 with Dr. Penne LELON Sharps, MD. Given patient's past medical history significant for cardiovascular diagnoses, presurgical cardiac clearance was sought by the PAT team. Per cardiology, this patient is optimized for surgery and may proceed with the planned procedural course with a LOW risk of significant perioperative cardiovascular complications.  Again, this patient is on daily oral antithrombotic therapy. He has been instructed on recommendations for holding his clopidogrel  for 7 days prior to his procedure with plans to restart as soon as postoperative bleeding risk felt to be minimized by his primary attending surgeon. The patient has been instructed that his last dose should be on 01/01/2024. Given that patient's past medical history is significant for cardiovascular diagnoses, including but not limited to CAD, neurosurgery has cleared patient to continue his daily low dose ASA throughout his perioperative course. He will be asked to hold his normal dose on the day of his procedure only. Patient has been updated on these directives from his specialty care providers by the PAT team.  Patient denies previous perioperative complications with anesthesia in the past. In review his EMR, it is noted that patient underwent a general anesthetic course here at [facility] in 01/2023 without documented complications.  MOST RECENT VITAL SIGNS:    01/01/2024   12:51 PM 12/29/2023   10:44 AM 12/15/2023    1:00 PM  Vitals with BMI  Height  5' 11 5' 11  Weight  156 lbs 156 lbs 6 oz  BMI  21.77 21.82  Systolic 142 172 821  Diastolic 84 102 116  Pulse 67 82    PROVIDERS/SPECIALISTS: NOTE: Primary physician provider listed below. Patient may have been seen by APP or partner within same practice.   PROVIDER ROLE / SPECIALTY LAST OV  Claudene Penne ORN, MD Neurosurgery (Surgeon) 12/15/2023  Everlene Parris LABOR, MD Primary Care Provider 12/29/2023  Florencio Shine, MD Cardiology 07/31/2023  Theotis Chancellor, MD Pulmonary Medicine 11/19/2023  Lane Hussar, MD Neurology 08/19/2022  Marea Mayo, MD Vascular Surgery 11/21/2023   ALLERGIES: Allergies  Allergen Reactions   Acetaminophen -Codeine Other (See Comments)    Nausea and hot flash    Chantix [Varenicline] Nausea Only    Sour taste in mouth   Codeine Nausea Only   Gabapentin Swelling   Pregabalin Swelling    CURRENT HOME MEDICATIONS: No current facility-administered medications for this encounter.    aspirin  EC 81 MG tablet   atorvastatin  (LIPITOR) 40 MG tablet   budesonide-formoterol  (SYMBICORT) 160-4.5 MCG/ACT inhaler   cholecalciferol (VITAMIN D3) 25 MCG (1000 UNIT) tablet   cloNIDine  (CATAPRES ) 0.1 MG tablet   ipratropium-albuterol  (DUONEB) 0.5-2.5 (3) MG/3ML SOLN   lisinopril -hydrochlorothiazide  (ZESTORETIC ) 20-12.5 MG tablet   metoprolol  succinate (TOPROL -XL) 100 MG 24 hr tablet   nicotine polacrilex (COMMIT) 4 MG lozenge   Potassium 99 MG TABS   vitamin B-12 (CYANOCOBALAMIN ) 500 MCG tablet   HISTORY: Past Medical History:  Diagnosis Date   AAA (abdominal aortic aneurysm) without rupture (HCC) 02/23/2020   a.) AAA duplex 02/23/2020: 3.8 cm; b.) AAA duplex 11/28/2020: 3.7 cm; c.) CTA AO + BIFEM 12/18/2020: 3.8 x 3.4 cm; d.) AAA duplex 11/07/2021: 3.7 cm; e.) CTA AO + BIFEM  11/16/2021: 4.2 cm; f.) AAA duplex 05/28/2022: 4.0 cm; g.) AAA duplex 11/26/2022: 4.4 cm; h.) PET CT 10/01/2022: 4.3 cm; i.) AAA duplex 08/06/2023: 4.8 cm; j.) CTA abd/pel 11/20/2023: 4.8 x 4.6 cm   Alcoholism (HCC)    Anginal pain (HCC)    Anxiety    Aortic atherosclerosis (HCC)    Aortic root dilatation (HCC)    Arthritis    Bilateral carotid artery disease (HCC) 06/20/2020   a.) carotid doppler 06/20/2020: 1-39% RICA, 40-59% LICA; b.) carotid doppler 11/28/2020, 11/26/2022: 1-39% BICA   Bilateral inguinal hernia    CAD (coronary artery disease) 06/03/2017   a.) LHC 06/03/2017: 25% pLAD - med mgmt; b.) LHC 08/22/2021: 25% pLAD, 15% o-mLM, 25% dLM-pLAD, 25% o-mLCx, 25% oD2 - med mgmt   Carpal tunnel syndrome    Cigarette smoker    COPD (chronic obstructive pulmonary disease) (HCC)    COVID-19 2022   Deep vein thrombosis (DVT) of left lower extremity (HCC)    Depression    Diastolic dysfunction    Diverticulosis    Dupuytren contracture of both hands    Dyspnea    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    a.) small area of chronic encephalomalacia in the LEFT inferior temporal gyrus   GERD (gastroesophageal reflux disease)    Hepatic steatosis    HLD (hyperlipidemia)    Hypertension    Incomplete left bundle branch block (LBBB)    Lung nodules    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   a.) PET CT 10/01/2022: 6 mm  with SUV max of 6.4; b.) s.p FNA 11/05/2022 --> pathology negative for malignancy (DDx oncocytosis vs oncocytoma)   PAOD (peripheral arterial occlusive disease) (HCC)    a.) s/p BILATERAL femoral endarterectomies 12/29/2020; b.) s/p aorto-bifemoral bypass   Pre-diabetes    Stenosis of cervical spine with myelopathy (HCC)    SVT (supraventricular tachycardia) (HCC)    a.) s/p ablation 02/28/2016   Vitamin B12 deficiency    Vitamin D deficiency    Past Surgical History:  Procedure Laterality Date   ANKLE SURGERY     bracelet   AORTO-FEMORAL BYPASS GRAFT      CHOLECYSTECTOMY  2000   COLONOSCOPY     ENDARTERECTOMY FEMORAL Bilateral 12/29/2020   Procedure: ENDARTERECTOMY FEMORAL;  Surgeon: Marea Selinda RAMAN, MD;  Location: [facility] ORS;  Service: Vascular;  Laterality: Bilateral;  Left SFA and Tibial intervention   INSERTION OF MESH Bilateral 02/12/2023   Procedure: INSERTION OF MESH;  Surgeon: Rodolph Romano, MD;  Location: [facility] ORS;  Service: General;  Laterality: Bilateral;   LEFT HEART CATH AND CORONARY ANGIOGRAPHY Left 06/03/2017   Procedure: LEFT HEART CATH AND CORONARY ANGIOGRAPHY;  Surgeon: Florencio Cara BIRCH, MD;  Location: [facility] INVASIVE CV LAB;  Service: Cardiovascular;  Laterality: Left;   LEFT HEART CATH AND CORONARY ANGIOGRAPHY N/A 08/22/2021   Procedure: LEFT HEART CATH AND CORONARY ANGIOGRAPHY;  Surgeon: Florencio Cara BIRCH, MD;  Location: [facility] INVASIVE CV LAB;  Service: Cardiovascular;  Laterality: N/A;   LOWER EXTREMITY ANGIOGRAPHY Left 02/03/2020   Procedure: LOWER EXTREMITY ANGIOGRAPHY;  Surgeon: Marea Selinda RAMAN, MD;  Location: [facility] INVASIVE CV LAB;  Service: Cardiovascular;  Laterality: Left;   SVT ABLATION N/A 02/28/2016   TONSILLECTOMY     Family History  Problem Relation Age of Onset   Diabetes Mother    Hypertension Mother    Lupus Mother    Heart attack Father    Cancer Brother    Cancer Brother    Social History   Tobacco Use   Smoking status: Every Day    Current packs/day: 0.75    Types: Cigarettes   Smokeless tobacco: Never  Substance Use Topics   Alcohol  use: Yes    Comment: 6-8 beers per day   LABS:  Hospital Outpatient Visit on 01/01/2024  Component Date Value Ref Range Status   Sodium 01/01/2024 132 (L)  135 - 145 mmol/L Final   Potassium 01/01/2024 3.4 (L)  3.5 - 5.1 mmol/L Final   Chloride 01/01/2024 92 (L)  98 - 111 mmol/L Final   CO2 01/01/2024 25  22 - 32 mmol/L Final   Glucose, Bld 01/01/2024 89  70 - 99 mg/dL Final   Glucose reference range applies only to samples taken after fasting for at  least 8 hours.   BUN 01/01/2024 6 (L)  8 - 23 mg/dL Final   Creatinine, Ser 01/01/2024 0.45 (L)  0.61 - 1.24 mg/dL Final   Calcium  01/01/2024 9.3  8.9 - 10.3 mg/dL Final   Total Protein 90/88/7974 7.2  6.5 - 8.1 g/dL Final   Albumin  01/01/2024 4.2  3.5 - 5.0 g/dL Final   AST 90/88/7974 30  15 - 41 U/L Final   ALT 01/01/2024 21  0 - 44 U/L Final   Alkaline Phosphatase 01/01/2024 44  38 - 126 U/L Final   Total Bilirubin 01/01/2024 1.1  0.0 - 1.2 mg/dL Final   GFR, Estimated 01/01/2024 >60  >60 mL/min Final   Comment: (NOTE) Calculated using the CKD-EPI Creatinine Equation (2021)  Anion gap 01/01/2024 15  5 - 15 Final   Performed at [facility], [address] [place], [place] [address]   ABO/RH(D) 01/01/2024 A POS   Final   Antibody Screen 01/01/2024 NEG   Final   Sample Expiration 01/01/2024 01/15/2024,2359   Final   Extend sample reason 01/01/2024    Final                   Value:NO TRANSFUSIONS OR PREGNANCY IN THE PAST 3 MONTHS Performed at [facility], [address] [place], [place] [address]    MRSA, PCR 01/01/2024 NEGATIVE  NEGATIVE Final   Staphylococcus aureus 01/01/2024 NEGATIVE  NEGATIVE Final   Comment: (NOTE) The Xpert SA Assay (FDA approved for NASAL specimens in patients 108 years of age and older), is one component of a comprehensive surveillance program. It is not intended to diagnose infection nor to guide or monitor treatment. Performed at [facility], [address] [place], [place] [address]     ECG: Date: 01/01/2024  Time ECG obtained: 1321 PM Rate: 61 bpm Rhythm: normal sinus Axis (leads I and aVF): normal Intervals: PR 192 ms. QRS 100 ms. QTc 412 ms. ST segment and T wave changes: No evidence of acute T wave abnormalities or significant ST segment elevation or depression.  Evidence of a possible, age undetermined, prior infarct:  No Comparison: Similar to previous tracing obtained on 02/05/2023   IMAGING /  PROCEDURES: CT ANGIO ABD/PEL W/ AND/OR W/O performed on 11/20/2023 Enlarging infrarenal abdominal aortic aneurysm, now measuring 4.8 x 4.6 cm (previously 4.3 cm on 10/01/2022), without evidence of leak or impending rupture. According to the ACR 2013 and SVS 2018 guidelines, recommendation is 40-month interval surveillance imaging Extensive atheromatous plaque through the abdominal aorta and bilateral common iliac arteries without stenosis. Moderate stenosis of the left renal artery due to calcified plaque, patent distally. No high-grade stenosis of the right renal artery.  MR CERVICAL SPINE WO CONTRAST performed on 11/15/2023 Multilevel cervical spondylosis as described above. There is mild flattening of the ventral cord at C3-4, C5-6 and C6-7. Marked left foraminal narrowing at C3-4, moderate to severe foraminal narrowing at C4-5 and C6-7 and marked bilateral foraminal narrowing at C5-6.  MR LUMBAR SPINE WO CONTRAST performed on 11/15/2023 Moderately severe compression of the thecal sac and narrowing of both lateral recesses at L3-4 due to a broad-based disc bulge, right worse than left facet degenerative change and ligamentum flavum thickening. Mild to moderate foraminal narrowing at this level is worse on the right. Mild central canal stenosis at L2-3 and L4-5. Mild left foraminal narrowing L5-S1. Abdominal aortic aneurysm as seen on prior exams.  VAS US  ABI WITH/WO TBI performed on 08/06/2023 Resting right ankle-brachial index indicates moderate right lower extremity arterial disease. The right toe-brachial index is abnormal.  Resting left ankle-brachial index is within normal range. The left toe-brachial index is normal.    AAA DUPLEX performed on 08/06/2023 There is evidence of abnormal dilatation of the mid abdominal aorta.  The largest aortic measurement is 4.8 cm. The largest aortic diameter has increased compared to prior exam.  Previous diameter measurement was 4.4 cm obtained on  11/26/2022.   CT CHEST WO CONTRAST performed on 01/02/2023 Right upper lobe peribronchovascular nodules with associated endobronchial debris, unchanged from 09/09/2022. Recommend return to annual lung cancer screening CT. Age advanced three-vessel coronary artery calcification. Hepatic steatosis. Partially imaged infrarenal aortic aneurysm, better measured on 10/01/2022 and at which time 12 month  follow-up and vascular consultation were recommended. Aortic atherosclerosis Emphysema    VAS US  CAROTID performed on 11/26/2022 Velocities in the right ICA are consistent with a 1-39% stenosis. Non-hemodynamically significant plaque <50% noted in the CCA.  Velocities in the left ICA are consistent with a 1-39% stenosis. Non-hemodynamically significant plaque <50% noted in the CCA. The ECA appears >50% stenosed.  Left vertebral artery demonstrates antegrade flow.  Right vertebral artery was not visualized.  Normal flow hemodynamics were seen in bilateral subclavian arteries.     NM PET IMAGE INITIAL (PI) SKULL BASE TO THIGH performed on 10/01/2022 Right upper lobe endobronchial debris and adjacent nodules are too small for PET resolution. Endobronchial debris has been present since 07/10/2021. Adjacent nodules are unchanged from 07/11/2022. Recommend follow-up CT chest without contrast in 3-6 months in further evaluation, as malignancy cannot be excluded. Hypermetabolic 6 mm left parotid nodule. Malignancy cannot be excluded. Steatotic enlarged liver. 4.3 cm infrarenal aortic aneurysm. Recommend follow-up every 12 months and vascular consultation. This recommendation follows ACR consensus guidelines: White Paper of the ACR Incidental Findings Committee II on Vascular Findings. J Am Coll Radiol 2013; 10:789-794. Right inguinal hernia contains small bowel. Aortic atherosclerosis Coronary artery calcification. Emphysema   MR BRAIN W WO CONTRAST performed on 06/11/2022 No acute intracranial  abnormality. Small area of chronic encephalomalacia in the left inferior temporal gyrus, nonspecific but most likely the sequelae of remote trauma. And otherwise mild for age cerebral white matter signal changes, nonspecific but most commonly due to chronic small vessel disease. Moderate to severe left maxillary sinus inflammation.  LEFT HEART CATHETERIZATION AND CORONARY ANGIOGRAPHY performed on 08/22/2021 Normal LV systolic function with an EF of 55-65% Normal LVEDP Normal overall wall motion Multivessel CAD Prox LAD lesion is 25% stenosed. Ost LM to Mid LM lesion is 15% stenosed. Dist LM to Prox LAD lesion is 25% stenosed. Ost Cx to Mid Cx lesion is 25% stenosed with 25% stenosed side branch in Ost 2nd Mrg. Intervention deferred. Medical management recommended.    MYOCARDIAL PERFUSION IMAGING STUDY (LEXISCAN ) performed on 12/11/2020 Borderline abnormal myocardial perfusion scan there is area of apical inferior borderline defect no clear evidence of reversible ischemia. Normal left ventricular function 53% with normal wall motion Consider further evaluation if symptoms worsen or persist otherwise medical therapy   TRANSTHORACIC ECHOCARDIOGRAM performed on 12/11/2020 Mildly reduced left ventricular systolic function with an EF of 45% Global hypokinesis Left ventricular diastolic Doppler parameters consistent with abnormal relaxation (G1DD). Trivial mitral, tricuspid, and pulmonary valve regurgitation RVSP = 36.1 mmHg Normal gradients; no valvular stenosis Mildly dilated aortic root measuring up to 4.2 cm  IMPRESSION AND PLAN: Scott [name] has been referred for pre-anesthesia review and clearance prior to him undergoing the planned anesthetic and procedural courses. Available labs, pertinent testing, and imaging results were personally reviewed by me in preparation for upcoming operative/procedural course. [facility] medical record has been updated following extensive record review  and patient interview with PAT staff.   This patient has been appropriately cleared by cardiology with an overall LOW risk of patient experiencing significant perioperative cardiovascular complications. Based on clinical review performed today (01/06/24), barring any significant acute changes in the patient's overall condition, it is anticipated that he will be able to proceed with the planned surgical intervention. Any acute changes in clinical condition may necessitate his procedure being postponed and/or cancelled. Patient will meet with anesthesia team (MD and/or CRNA) on the day of his procedure for preoperative evaluation/assessment. Questions regarding anesthetic course  will be fielded at that time.   Pre-surgical instructions were reviewed with the patient during his PAT appointment, and questions were fielded to satisfaction by PAT clinical staff. He has been instructed on which medications that he will need to hold prior to surgery, as well as the ones that have been deemed safe/appropriate to take on the day of his procedure. As part of the general education provided by PAT, patient made aware both verbally and in writing, that he would need to abstain from the use of any illegal substances during his perioperative course. He was advised that failure to follow the provided instructions could necessitate case cancellation or result in serious perioperative complications up to and including death. Patient encouraged to contact PAT and/or his surgeon's office to discuss any questions or concerns that may arise prior to surgery; verbalized understanding.   Dorise Pereyra, MSN, APRN, FNP-C, CEN [facility]  Perioperative Services Nurse Practitioner Phone: [phone] Fax: [phone] 01/06/24 2:35 PM  NOTE: This note has been prepared using Dragon dictation software. Despite my best ability to proofread, there is always the potential that unintentional transcriptional errors  may still occur from this process.

## 2024-01-07 ENCOUNTER — Encounter: Payer: Self-pay | Admitting: Neurosurgery

## 2024-01-08 ENCOUNTER — Other Ambulatory Visit: Payer: Self-pay | Admitting: Physician Assistant

## 2024-01-08 ENCOUNTER — Ambulatory Visit: Payer: Self-pay | Admitting: Urgent Care

## 2024-01-08 ENCOUNTER — Telehealth: Payer: Self-pay

## 2024-01-08 ENCOUNTER — Ambulatory Visit
Admission: RE | Admit: 2024-01-08 | Discharge: 2024-01-08 | Disposition: A | Discharge status: Home / Self Care | Source: Ambulatory Visit | Attending: Neurosurgery | Admitting: Neurosurgery

## 2024-01-08 ENCOUNTER — Other Ambulatory Visit: Payer: Self-pay

## 2024-01-08 ENCOUNTER — Ambulatory Visit

## 2024-01-08 ENCOUNTER — Encounter: Payer: Self-pay | Admitting: Neurosurgery

## 2024-01-08 ENCOUNTER — Encounter
Admission: RE | Disposition: A | Discharge status: Home / Self Care | Payer: Self-pay | Source: Ambulatory Visit | Attending: Neurosurgery

## 2024-01-08 DIAGNOSIS — I714 Abdominal aortic aneurysm, without rupture, unspecified: Secondary | ICD-10-CM | POA: Diagnosis not present

## 2024-01-08 DIAGNOSIS — E785 Hyperlipidemia, unspecified: Secondary | ICD-10-CM | POA: Insufficient documentation

## 2024-01-08 DIAGNOSIS — G992 Myelopathy in diseases classified elsewhere: Secondary | ICD-10-CM | POA: Diagnosis present

## 2024-01-08 DIAGNOSIS — F17210 Nicotine dependence, cigarettes, uncomplicated: Secondary | ICD-10-CM | POA: Diagnosis not present

## 2024-01-08 DIAGNOSIS — Z7902 Long term (current) use of antithrombotics/antiplatelets: Secondary | ICD-10-CM | POA: Insufficient documentation

## 2024-01-08 DIAGNOSIS — Z01818 Encounter for other preprocedural examination: Secondary | ICD-10-CM

## 2024-01-08 DIAGNOSIS — M4312 Spondylolisthesis, cervical region: Secondary | ICD-10-CM | POA: Diagnosis not present

## 2024-01-08 DIAGNOSIS — M4802 Spinal stenosis, cervical region: Secondary | ICD-10-CM | POA: Diagnosis not present

## 2024-01-08 DIAGNOSIS — G629 Polyneuropathy, unspecified: Secondary | ICD-10-CM | POA: Insufficient documentation

## 2024-01-08 DIAGNOSIS — Z86718 Personal history of other venous thrombosis and embolism: Secondary | ICD-10-CM | POA: Diagnosis not present

## 2024-01-08 DIAGNOSIS — M4712 Other spondylosis with myelopathy, cervical region: Secondary | ICD-10-CM | POA: Insufficient documentation

## 2024-01-08 DIAGNOSIS — I2510 Atherosclerotic heart disease of native coronary artery without angina pectoris: Secondary | ICD-10-CM | POA: Diagnosis not present

## 2024-01-08 DIAGNOSIS — I739 Peripheral vascular disease, unspecified: Secondary | ICD-10-CM | POA: Diagnosis not present

## 2024-01-08 DIAGNOSIS — I25119 Atherosclerotic heart disease of native coronary artery with unspecified angina pectoris: Secondary | ICD-10-CM | POA: Diagnosis not present

## 2024-01-08 DIAGNOSIS — Z981 Arthrodesis status: Secondary | ICD-10-CM | POA: Diagnosis not present

## 2024-01-08 DIAGNOSIS — G5602 Carpal tunnel syndrome, left upper limb: Secondary | ICD-10-CM | POA: Diagnosis not present

## 2024-01-08 DIAGNOSIS — G959 Disease of spinal cord, unspecified: Secondary | ICD-10-CM | POA: Diagnosis present

## 2024-01-08 DIAGNOSIS — F17200 Nicotine dependence, unspecified, uncomplicated: Secondary | ICD-10-CM | POA: Diagnosis not present

## 2024-01-08 DIAGNOSIS — I700 Atherosclerosis of aorta: Secondary | ICD-10-CM | POA: Insufficient documentation

## 2024-01-08 DIAGNOSIS — I10 Essential (primary) hypertension: Secondary | ICD-10-CM | POA: Insufficient documentation

## 2024-01-08 DIAGNOSIS — R7303 Prediabetes: Secondary | ICD-10-CM | POA: Insufficient documentation

## 2024-01-08 DIAGNOSIS — I447 Left bundle-branch block, unspecified: Secondary | ICD-10-CM | POA: Diagnosis not present

## 2024-01-08 DIAGNOSIS — Z7982 Long term (current) use of aspirin: Secondary | ICD-10-CM | POA: Diagnosis not present

## 2024-01-08 HISTORY — DX: Thoracic aortic ectasia: I77.810

## 2024-01-08 HISTORY — PX: ANTERIOR CERVICAL DECOMP/DISCECTOMY FUSION: SHX1161

## 2024-01-08 HISTORY — DX: Myelopathy in diseases classified elsewhere: G99.2

## 2024-01-08 HISTORY — DX: Other ill-defined heart diseases: I51.89

## 2024-01-08 HISTORY — DX: Left bundle-branch block, unspecified: I44.7

## 2024-01-08 HISTORY — DX: Carpal tunnel syndrome, unspecified upper limb: G56.00

## 2024-01-08 HISTORY — DX: Other specified disorders of brain: G93.89

## 2024-01-08 SURGERY — ANTERIOR CERVICAL DECOMPRESSION/DISCECTOMY FUSION 1 LEVEL
Anesthesia: General | Site: Spine Cervical

## 2024-01-08 MED ORDER — PHENYLEPHRINE HCL-NACL 20-0.9 MG/250ML-% IV SOLN
INTRAVENOUS | Status: AC
Start: 2024-01-08 — End: 2024-01-08
  Filled 2024-01-08: qty 250

## 2024-01-08 MED ORDER — CHLORHEXIDINE GLUCONATE 0.12 % MT SOLN
OROMUCOSAL | Status: AC
  Filled 2024-01-08: qty 15

## 2024-01-08 MED ORDER — SUCCINYLCHOLINE CHLORIDE 200 MG/10ML IV SOSY
PREFILLED_SYRINGE | INTRAVENOUS | Status: DC | PRN
  Administered 2024-01-08: 100 mg via INTRAVENOUS

## 2024-01-08 MED ORDER — ONDANSETRON HCL 4 MG/2ML IJ SOLN
INTRAMUSCULAR | Status: AC
  Filled 2024-01-08: qty 2

## 2024-01-08 MED ORDER — MIDAZOLAM HCL 2 MG/2ML IJ SOLN
INTRAMUSCULAR | Status: DC | PRN
  Administered 2024-01-08: 2 mg via INTRAVENOUS

## 2024-01-08 MED ORDER — FENTANYL CITRATE (PF) 100 MCG/2ML IJ SOLN
INTRAMUSCULAR | Status: AC
  Filled 2024-01-08: qty 2

## 2024-01-08 MED ORDER — CEFAZOLIN SODIUM-DEXTROSE 2-4 GM/100ML-% IV SOLN
INTRAVENOUS | Status: AC
  Filled 2024-01-08: qty 100

## 2024-01-08 MED ORDER — OXYCODONE HCL 5 MG PO TABS
5.0000 mg | ORAL_TABLET | Freq: Once | ORAL | Status: AC | PRN
  Administered 2024-01-08: 5 mg via ORAL

## 2024-01-08 MED ORDER — CHLORHEXIDINE GLUCONATE 0.12 % MT SOLN
15.0000 mL | Freq: Once | OROMUCOSAL | Status: AC
  Administered 2024-01-08: 15 mL via OROMUCOSAL

## 2024-01-08 MED ORDER — LABETALOL HCL 5 MG/ML IV SOLN
INTRAVENOUS | Status: AC
  Filled 2024-01-08: qty 4

## 2024-01-08 MED ORDER — HYDROMORPHONE HCL 1 MG/ML IJ SOLN: 1.0000 mg | INTRAMUSCULAR | Status: DC | PRN

## 2024-01-08 MED ORDER — PHENYLEPHRINE HCL-NACL 20-0.9 MG/250ML-% IV SOLN
INTRAVENOUS | Status: DC | PRN
  Administered 2024-01-08: 30 ug/min via INTRAVENOUS

## 2024-01-08 MED ORDER — ALBUTEROL SULFATE HFA 108 (90 BASE) MCG/ACT IN AERS
INHALATION_SPRAY | RESPIRATORY_TRACT | Status: DC | PRN
  Administered 2024-01-08: 6 via RESPIRATORY_TRACT

## 2024-01-08 MED ORDER — PROPOFOL 10 MG/ML IV BOLUS
INTRAVENOUS | Status: DC | PRN
  Administered 2024-01-08: 30 mg via INTRAVENOUS
  Administered 2024-01-08: 150 mg via INTRAVENOUS
  Administered 2024-01-08: 20 mg via INTRAVENOUS
  Administered 2024-01-08: 125 ug/kg/min via INTRAVENOUS
  Administered 2024-01-08: 150 ug/kg/min via INTRAVENOUS

## 2024-01-08 MED ORDER — REMIFENTANIL HCL 1 MG IV SOLR
INTRAVENOUS | Status: AC
  Filled 2024-01-08: qty 1000

## 2024-01-08 MED ORDER — OXYCODONE HCL 5 MG/5ML PO SOLN: 5.0000 mg | Freq: Once | ORAL | Status: AC | PRN

## 2024-01-08 MED ORDER — SURGIFLO WITH THROMBIN (HEMOSTATIC MATRIX KIT) OPTIME
TOPICAL | Status: DC | PRN
  Administered 2024-01-08: 1 via TOPICAL

## 2024-01-08 MED ORDER — BUPIVACAINE-EPINEPHRINE (PF) 0.5% -1:200000 IJ SOLN
INTRAMUSCULAR | Status: DC | PRN
  Administered 2024-01-08: 7 mL

## 2024-01-08 MED ORDER — LIDOCAINE HCL (CARDIAC) PF 100 MG/5ML IV SOSY
PREFILLED_SYRINGE | INTRAVENOUS | Status: DC | PRN
  Administered 2024-01-08: 80 mg via INTRAVENOUS

## 2024-01-08 MED ORDER — DOCUSATE SODIUM 100 MG PO CAPS
100.0000 mg | ORAL_CAPSULE | Freq: Two times a day (BID) | ORAL | 0 refills | Status: DC
Start: 2024-01-08 — End: 2024-02-16

## 2024-01-08 MED ORDER — HYDROMORPHONE HCL 2 MG PO TABS: 2.0000 mg | ORAL_TABLET | ORAL | 0 refills | Status: DC | PRN

## 2024-01-08 MED ORDER — SENNA 8.6 MG PO TABS
1.0000 | ORAL_TABLET | Freq: Every day | ORAL | 0 refills | Status: DC
Start: 2024-01-08 — End: 2024-02-16

## 2024-01-08 MED ORDER — SUCCINYLCHOLINE CHLORIDE 200 MG/10ML IV SOSY
PREFILLED_SYRINGE | INTRAVENOUS | Status: AC
  Filled 2024-01-08: qty 10

## 2024-01-08 MED ORDER — PHENYLEPHRINE 80 MCG/ML (10ML) SYRINGE FOR IV PUSH (FOR BLOOD PRESSURE SUPPORT)
PREFILLED_SYRINGE | INTRAVENOUS | Status: DC | PRN
  Administered 2024-01-08 (×3): 160 ug via INTRAVENOUS

## 2024-01-08 MED ORDER — PHENYLEPHRINE 80 MCG/ML (10ML) SYRINGE FOR IV PUSH (FOR BLOOD PRESSURE SUPPORT)
PREFILLED_SYRINGE | INTRAVENOUS | Status: AC
  Filled 2024-01-08: qty 10

## 2024-01-08 MED ORDER — THIAMINE HCL 100 MG/ML IJ SOLN
100.0000 mg | Freq: Every day | INTRAMUSCULAR | Status: DC
Start: 2024-01-08 — End: 2024-01-08

## 2024-01-08 MED ORDER — FENTANYL CITRATE (PF) 100 MCG/2ML IJ SOLN
25.0000 ug | INTRAMUSCULAR | Status: DC | PRN
  Administered 2024-01-08: 25 ug via INTRAVENOUS
  Administered 2024-01-08: 50 ug via INTRAVENOUS
  Administered 2024-01-08: 25 ug via INTRAVENOUS

## 2024-01-08 MED ORDER — LORAZEPAM 2 MG/ML IJ SOLN: 1.0000 mg | INTRAMUSCULAR | Status: DC | PRN

## 2024-01-08 MED ORDER — OXYCODONE HCL 5 MG PO TABS
ORAL_TABLET | ORAL | Status: AC
  Filled 2024-01-08: qty 1

## 2024-01-08 MED ORDER — SODIUM CHLORIDE (PF) 0.9 % IJ SOLN
INTRAMUSCULAR | Status: AC
  Filled 2024-01-08: qty 20

## 2024-01-08 MED ORDER — LORAZEPAM 1 MG PO TABS: 1.0000 mg | ORAL_TABLET | ORAL | Status: DC | PRN

## 2024-01-08 MED ORDER — FENTANYL CITRATE (PF) 100 MCG/2ML IJ SOLN
INTRAMUSCULAR | Status: DC | PRN
  Administered 2024-01-08 (×2): 50 ug via INTRAVENOUS

## 2024-01-08 MED ORDER — REMIFENTANIL HCL 1 MG IV SOLR: INTRAVENOUS | Status: DC | PRN

## 2024-01-08 MED ORDER — CEPHALEXIN 500 MG PO CAPS: 500.0000 mg | ORAL_CAPSULE | Freq: Three times a day (TID) | ORAL | 0 refills | Status: DC

## 2024-01-08 MED ORDER — ADULT MULTIVITAMIN W/MINERALS CH: 1.0000 | ORAL_TABLET | Freq: Every day | ORAL | Status: DC

## 2024-01-08 MED ORDER — BUPIVACAINE-EPINEPHRINE (PF) 0.5% -1:200000 IJ SOLN
INTRAMUSCULAR | Status: AC
  Filled 2024-01-08: qty 10

## 2024-01-08 MED ORDER — HYDROCODONE-ACETAMINOPHEN 10-325 MG PO TABS: 2.0000 | ORAL_TABLET | ORAL | Status: DC | PRN

## 2024-01-08 MED ORDER — ACETAMINOPHEN 10 MG/ML IV SOLN
INTRAVENOUS | Status: DC | PRN
  Administered 2024-01-08: 1000 mg via INTRAVENOUS

## 2024-01-08 MED ORDER — CEFAZOLIN SODIUM-DEXTROSE 2-4 GM/100ML-% IV SOLN
2.0000 g | Freq: Once | INTRAVENOUS | Status: AC
  Administered 2024-01-08: 2 g via INTRAVENOUS

## 2024-01-08 MED ORDER — FOLIC ACID 1 MG PO TABS: 1.0000 mg | ORAL_TABLET | Freq: Every day | ORAL | Status: DC

## 2024-01-08 MED ORDER — PROPOFOL 10 MG/ML IV BOLUS
INTRAVENOUS | Status: AC
  Filled 2024-01-08: qty 20

## 2024-01-08 MED ORDER — SODIUM CHLORIDE 0.9 % IV SOLN
INTRAVENOUS | Status: DC | PRN
  Administered 2024-01-08: .2 ug/kg/min via INTRAVENOUS

## 2024-01-08 MED ORDER — MIDAZOLAM HCL 2 MG/2ML IJ SOLN
INTRAMUSCULAR | Status: AC
  Filled 2024-01-08: qty 2

## 2024-01-08 MED ORDER — DEXAMETHASONE SODIUM PHOSPHATE 10 MG/ML IJ SOLN
INTRAMUSCULAR | Status: AC
Start: 2024-01-08 — End: 2024-01-08
  Filled 2024-01-08: qty 1

## 2024-01-08 MED ORDER — LIDOCAINE HCL (PF) 2 % IJ SOLN
INTRAMUSCULAR | Status: AC
  Filled 2024-01-08: qty 5

## 2024-01-08 MED ORDER — LACTATED RINGERS IV SOLN: INTRAVENOUS | Status: DC

## 2024-01-08 MED ORDER — PROPOFOL 1000 MG/100ML IV EMUL
INTRAVENOUS | Status: AC
  Filled 2024-01-08: qty 100

## 2024-01-08 MED ORDER — ACETAMINOPHEN 10 MG/ML IV SOLN
INTRAVENOUS | Status: AC
  Filled 2024-01-08: qty 100

## 2024-01-08 MED ORDER — ALBUTEROL SULFATE HFA 108 (90 BASE) MCG/ACT IN AERS
INHALATION_SPRAY | RESPIRATORY_TRACT | Status: AC
  Filled 2024-01-08: qty 6.7

## 2024-01-08 MED ORDER — ONDANSETRON HCL 4 MG PO TABS: 4.0000 mg | ORAL_TABLET | Freq: Three times a day (TID) | ORAL | 0 refills | Status: DC | PRN

## 2024-01-08 MED ORDER — ACETAMINOPHEN 500 MG PO TABS: 1000.0000 mg | ORAL_TABLET | Freq: Four times a day (QID) | ORAL | 0 refills | Status: AC | PRN

## 2024-01-08 MED ORDER — DEXAMETHASONE SODIUM PHOSPHATE 10 MG/ML IJ SOLN
INTRAMUSCULAR | Status: DC | PRN
  Administered 2024-01-08: 10 mg via INTRAVENOUS

## 2024-01-08 MED ORDER — GLYCOPYRROLATE 0.2 MG/ML IJ SOLN
INTRAMUSCULAR | Status: AC
Start: 2024-01-08 — End: 2024-01-08
  Filled 2024-01-08: qty 1

## 2024-01-08 MED ORDER — ORAL CARE MOUTH RINSE: 15.0000 mL | Freq: Once | OROMUCOSAL | Status: AC

## 2024-01-08 MED ORDER — 0.9 % SODIUM CHLORIDE (POUR BTL) OPTIME
TOPICAL | Status: DC | PRN
  Administered 2024-01-08: 500 mL

## 2024-01-08 MED ORDER — LABETALOL HCL 5 MG/ML IV SOLN
10.0000 mg | Freq: Once | INTRAVENOUS | Status: AC
  Administered 2024-01-08: 10 mg via INTRAVENOUS

## 2024-01-08 MED ORDER — ONDANSETRON HCL 4 MG/2ML IJ SOLN
INTRAMUSCULAR | Status: DC | PRN
  Administered 2024-01-08: 4 mg via INTRAVENOUS

## 2024-01-08 MED ORDER — VITAMIN B-1 100 MG PO TABS: 100.0000 mg | ORAL_TABLET | Freq: Every day | ORAL | Status: DC

## 2024-01-08 SURGICAL SUPPLY — 49 items
BASIN KIT SINGLE STR (MISCELLANEOUS) ×2 IMPLANT
BIT DRILL ACP 13 (DRILL) ×1 IMPLANT
BNDG ADH 1X3 SHEER STRL LF (GAUZE/BANDAGES/DRESSINGS) ×2 IMPLANT
BNDG ADH 2 X3.75 FABRIC TAN LF (GAUZE/BANDAGES/DRESSINGS) IMPLANT
BRUSH SCRUB EZ 4% CHG (MISCELLANEOUS) ×2 IMPLANT
BUR NEURO DRILL SOFT 3.0X3.8M (BURR) ×2 IMPLANT
CHLORAPREP W/TINT 26 (MISCELLANEOUS) ×2 IMPLANT
COVER PROBE FLX POLY STRL (MISCELLANEOUS) ×2 IMPLANT
DERMABOND ADVANCED .7 DNX12 (GAUZE/BANDAGES/DRESSINGS) ×2 IMPLANT
DRAIN CHANNEL JP 10F RND 20C F (MISCELLANEOUS) IMPLANT
DRAPE C-ARM XRAY 36X54 (DRAPES) ×4 IMPLANT
DRAPE EXTREMITY 106X87X128.5 (DRAPES) ×2 IMPLANT
DRAPE IMP U-DRAPE 54X76 (DRAPES) ×2 IMPLANT
DRAPE LAPAROTOMY 77X122 PED (DRAPES) ×2 IMPLANT
DRAPE MICROSCOPE SPINE 48X150 (DRAPES) ×2 IMPLANT
DRAPE SHEET LG 3/4 BI-LAMINATE (DRAPES) ×2 IMPLANT
DRSG TEGADERM 2-3/8X2-3/4 SM (GAUZE/BANDAGES/DRESSINGS) IMPLANT
ELECTRODE REM PT RTRN 9FT ADLT (ELECTROSURGICAL) ×2 IMPLANT
EVACUATOR SILICONE 100CC (DRAIN) IMPLANT
FEE INTRAOP CADWELL SUPPLY NCS (MISCELLANEOUS) ×1 IMPLANT
FEE INTRAOP MONITOR IMPULS NCS (MISCELLANEOUS) ×1 IMPLANT
GLOVE BIOGEL PI IND STRL 7.0 (GLOVE) ×2 IMPLANT
GLOVE BIOGEL PI IND STRL 8 (GLOVE) ×2 IMPLANT
GLOVE SRG 8 PF TXTR STRL LF DI (GLOVE) ×2 IMPLANT
GLOVE SURG SYN 7.0 PF PI (GLOVE) ×2 IMPLANT
GLOVE SURG SYN 7.5 PF PI (GLOVE) ×2 IMPLANT
GOWN SRG XL LVL 3 NONREINFORCE (GOWNS) ×2 IMPLANT
GOWN STRL REUS W/ TWL XL LVL3 (GOWN DISPOSABLE) ×2 IMPLANT
KIT TURNOVER KIT A (KITS) ×2 IMPLANT
MANIFOLD NEPTUNE II (INSTRUMENTS) ×2 IMPLANT
NDL HYPO 25X1 1.5 SAFETY (NEEDLE) ×1 IMPLANT
NEEDLE HYPO 25X1 1.5 SAFETY (NEEDLE) ×2 IMPLANT
NS IRRIG 500ML POUR BTL (IV SOLUTION) ×2 IMPLANT
PACK BASIN MINOR ARMC (MISCELLANEOUS) ×2 IMPLANT
PACK LAMINECTOMY ARMC (PACKS) ×2 IMPLANT
PAD ARMBOARD POSITIONER FOAM (MISCELLANEOUS) ×4 IMPLANT
PIN CASPAR 14 (PIN) ×2 IMPLANT
PLATE ACP 1.6 18 (Plate) ×1 IMPLANT
SCREW ACP 3.5X17 (Screw) ×4 IMPLANT
SPACER CERVICAL FRGE 12X14X7-7 (Spacer) ×1 IMPLANT
SPONGE KITTNER 5P (MISCELLANEOUS) ×2 IMPLANT
STOCKINETTE IMPERVIOUS 9X36 MD (GAUZE/BANDAGES/DRESSINGS) ×2 IMPLANT
SURGIFLO W/THROMBIN 8M KIT (HEMOSTASIS) ×2 IMPLANT
SUT STRATA 3-0 30 PS-1 (SUTURE) ×2 IMPLANT
SUT VIC AB 3-0 SH 8-18 (SUTURE) ×2 IMPLANT
SYR 20ML LL LF (SYRINGE) ×2 IMPLANT
TAPE CLOTH 3X10 WHT NS LF (GAUZE/BANDAGES/DRESSINGS) ×4 IMPLANT
TRAP FLUID SMOKE EVACUATOR (MISCELLANEOUS) ×2 IMPLANT
ULTRAGLIDE CTR (BLADE) ×2 IMPLANT

## 2024-01-08 NOTE — Anesthesia Postprocedure Evaluation (Signed)
 Anesthesia Post Note  Patient: Scott [name]  Procedure(s) Performed: ANTERIOR CERVICAL DECOMPRESSION/DISCECTOMY FUSION 1 LEVEL (Spine Cervical)  Patient location during evaluation: PACU Anesthesia Type: General Level of consciousness: awake and alert Pain management: pain level controlled Vital Signs Assessment: post-procedure vital signs reviewed and stable Respiratory status: spontaneous breathing, nonlabored ventilation, respiratory function stable and patient connected to nasal cannula oxygen Cardiovascular status: blood pressure returned to baseline and stable Postop Assessment: no apparent nausea or vomiting Anesthetic complications: no   No notable events documented.   Last Vitals:  Vitals:   01/08/24 1115 01/08/24 1145  BP: (!) 175/101   Pulse: 83   Resp: 10   Temp:  (!) 36.3 C  SpO2: 95%     Last Pain:  Vitals:   01/08/24 1155  TempSrc:   PainSc: 5                  Debby Mines

## 2024-01-08 NOTE — Brief Op Note (Signed)
 01/08/2024  10:20 AM  PATIENT:  Scott [name]  63 y.o. 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 M43.12 Acquired spondylolisthesis of cervical vertebra G56.02 Carpal tunnel syndrome on left  POST-OPERATIVE DIAGNOSIS:  M48.02, G99.2 Stenosis of cervical spine with myelopathyM43.12 Acquired spondylolisthesis of cervical vertebraG56.02 Carpal tunnel syndrome on left  PROCEDURE:  Procedure(s) with comments: ANTERIOR CERVICAL DECOMPRESSION/DISCECTOMY FUSION 1 LEVEL (N/A) - C3-4 ANTERIOR CERVICAL DISCECTOMY AND FUSION  SURGEON:  Surgeons and Role:    DEWAINE Claudene Penne LELON, MD - Primary  ASSISTANTS: Lyle Decamp, PA-C  ANESTHESIA:   general  EBL:  20cc  BLOOD ADMINISTERED:none  DRAINS: none   LOCAL MEDICATIONS USED:  LIDOCAINE    SPECIMEN:  No Specimen  DISPOSITION OF SPECIMEN:  N/A  COUNTS:  YES  TOURNIQUET:  * No tourniquets in log *  DICTATION: .Dragon Dictation  PLAN OF CARE: Admit for overnight observation, patient may leave later this afternoon if stable and wants to go  PATIENT DISPOSITION:  PACU - hemodynamically stable.   Delay start of Pharmacological VTE agent (>24hrs) due to surgical blood loss or risk of bleeding: no

## 2024-01-08 NOTE — Interval H&P Note (Addendum)
 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  The various methods of treatment have been discussed with the patient and family. After consideration of risks, benefits and other options for treatment, the patient has consented to  Procedure(s) with comments: ANTERIOR CERVICAL DECOMPRESSION/DISCECTOMY FUSION 1 LEVEL (N/A) - C3-4 ANTERIOR CERVICAL DISCECTOMY AND FUSION CARPAL TUNNEL RELEASE (N/A) - LEFT CARPAL TUNNEL RELEASE WITH ULTRASOUND GUIDANCE as a surgical intervention.  [name] history has been reviewed, patient examined, no change in status, stable for surgery.  I have reviewed the patient's chart and labs.  Questions were answered to the patient's satisfaction.    Patient has severe progressive cervical myelopathy, had another fall yesterday and has skin injury over his left fifth MCP joint on the dorsum of his hand.  This has been cleaned and covered with a dressing.  I did discuss with him that he is likely at a slightly higher risk for a cervical spine infection postop given the open skin wound, however he is very concerned that he is having continual rapid progression of his myelopathy and decreased function in his upper and lower extremities.  Feels like this is worsening every day.  I discussed the risks of surgery which include a increased risk of infection with his skin wound, I discussed that this may put him at a higher risk for surgical site infection or epidural abscess which can lead to paralysis or death.  He states that he understood the risks but given his rapid progressive disability he wants to go forward with surgery.  Will plan to go forward with the C3-4 ACDF.  Will not perform the left-sided carpal tunnel release given its proximity to the  wound.  Heart and lungs are clear  Penne LELON Sharps

## 2024-01-08 NOTE — Transfer of Care (Signed)
 Immediate Anesthesia Transfer of Care Note  Patient: Scott [name]  Procedure(s) Performed: ANTERIOR CERVICAL DECOMPRESSION/DISCECTOMY FUSION 1 LEVEL (Spine Cervical)  Patient Location: PACU  Anesthesia Type:General  Level of Consciousness: drowsy and patient cooperative  Airway & Oxygen Therapy: Patient Spontanous Breathing and Patient connected to face mask oxygen  Post-op Assessment: Report given to RN and Post -op Vital signs reviewed and stable  Post vital signs: Reviewed and stable  Last Vitals:  Vitals Value Taken Time  BP 170/109 01/08/24 10:49  Temp    Pulse 82 01/08/24 10:49  Resp 12 01/08/24 10:49  SpO2 100 % 01/08/24 10:49    Last Pain:  Vitals:   01/08/24 1049  TempSrc: Tympanic  PainSc: 0-No pain         Complications: No notable events documented.

## 2024-01-08 NOTE — Progress Notes (Signed)
 Dr. Claudene notified of and in to examine left hand. Patient states he fell in bathroom yesterday onto right side . Now has some right hip soreness that is better today . Also fell on left hand. States hand normally purplish in color but today there is a bit more swelling than normal in hand. Has skin tear covered with large bandaid. Area examined by Dr. Claudene. Also has poor sensation in this hand

## 2024-01-08 NOTE — Telephone Encounter (Signed)
 I have received a fax from [facility] stating that the Hydromorphone  is on back order at that pharmacy. I called and canceled this prescription there.  I called [facility] [address] and they do have the Hydromorphone  in stock, can you please resend the medication to that pharmacy? Pharmacy has been updated.   I also attempted to call the patient to let him know about this but he didn't answer and he has no voicemail set up.

## 2024-01-08 NOTE — Evaluation (Signed)
 Physical Therapy Evaluation Patient Details Name: Scott [name] MRN: [id number] DOB: Oct 13, 1960 Today's Date: 01/08/2024  History of Present Illness  Pt is a 63 yo male s/p C3-C4 ACDF. PMH of HTN, COPD, PVD, CAD, AAA, GERD, anxiety, depression, etoh abuse, DVT.  Clinical Impression  Pt A&Ox4, agreeable to PT evaluation, denied pain at time of session. At baseline, pt reports being ambulatory with RW, has not been using RW recently due to his sister using it, and has a hx of recent falls. Pt was met sitting in recliner, able to perform STS with minA to control descent to sitting. Pt amb ~24ft with RW and CGA, demonstrated difficulty maintaining grip on RW due to bilateral carpal tunnel syndrome, no LOB throughout. Pt noted to have one posterior LOB in standing requiring minA to correct. Pt given extensive education on cervical precautions and importance of having 24/7 supervision at home for mobility to minimize fall risk- pt verbalized understanding to all education. Pt was left sitting in recliner at end of session, all needs in reach. Pt would benefit from skilled PT intervention to address listed deficits (see PT Problem List) and allow for safe return to PLOF.       If plan is discharge home, recommend the following: A lot of help with walking and/or transfers;A lot of help with bathing/dressing/bathroom;Assistance with cooking/housework;Assist for transportation   Can travel by private vehicle        Equipment Recommendations None recommended by PT  Recommendations for Other Services       Functional Status Assessment Patient has had a recent decline in their functional status and demonstrates the ability to make significant improvements in function in a reasonable and predictable amount of time.     Precautions / Restrictions Precautions Precautions: Cervical;Fall Precaution Booklet Issued: Yes (comment) Recall of Precautions/Restrictions: Impaired Restrictions Weight Bearing  Restrictions Per Provider Order: No      Mobility  Bed Mobility               General bed mobility comments: NT, pt seated in recliner at start/end of session    Transfers Overall transfer level: Needs assistance Equipment used: Rolling walker (2 wheels) Transfers: Sit to/from Stand Sit to Stand: Min assist           General transfer comment: STS from recliner with minA to control descent to sitting    Ambulation/Gait Ambulation/Gait assistance: Contact guard assist Gait Distance (Feet): 75 Feet Assistive device: Rolling walker (2 wheels) Gait Pattern/deviations: Step-through pattern, Wide base of support Gait velocity: decreased     General Gait Details: no LOB, decreased foot clearance bilaterally, difficulty maintaining grip on RW due to carpal tunnel in hands  Stairs            Wheelchair Mobility     Tilt Bed    Modified Rankin (Stroke Patients Only)       Balance Overall balance assessment: Needs assistance Sitting-balance support: Feet supported Sitting balance-Leahy Scale: Good     Standing balance support: Bilateral upper extremity supported Standing balance-Leahy Scale: Fair Standing balance comment: one posterior LOB on initial stanidng required minA to correct                             Pertinent Vitals/Pain Pain Assessment Pain Assessment: No/denies pain    Home Living Family/patient expects to be discharged to:: Private residence Living Arrangements: Other relatives (sister) Available Help at Discharge: Friend(s);Available PRN/intermittently Type of  Home: Apartment Home Access: Level entry       Home Layout: One level Home Equipment: Rollator (4 wheels);Wheelchair - manual Additional Comments: pt states that his friend comes by frequently, is having a home-health aid start with him on 9/23    Prior Function Prior Level of Function : Needs assist;History of Falls (last six months)             Mobility  Comments: Pt states that he typically uses RW for mobility, has not been able to recently due to his sister using RW. has had trouble using AD due to bilateral hand pain. had a fall just prior to surgery this AM ADLs Comments: Pt's friend assists with cooking, cleaning. IND with dressing and bathing     Extremity/Trunk Assessment   Upper Extremity Assessment Upper Extremity Assessment: Generalized weakness (grip strength equal bilaterally, bilateral hand ROM limited)    Lower Extremity Assessment Lower Extremity Assessment: Overall WFL for tasks assessed       Communication   Communication Communication: No apparent difficulties    Cognition Arousal: Alert Behavior During Therapy: WFL for tasks assessed/performed, Restless   PT - Cognitive impairments: No apparent impairments                       PT - Cognition Comments: A&Ox4, anxious to leave Following commands: Intact       Cueing Cueing Techniques: Verbal cues, Visual cues     General Comments General comments (skin integrity, edema, etc.): pt states that he has sores on the plantar surface of his feet    Exercises     Assessment/Plan    PT Assessment Patient needs continued PT services  PT Problem List Decreased strength;Decreased activity tolerance;Decreased balance;Decreased mobility;Decreased knowledge of use of DME;Decreased safety awareness;Decreased knowledge of precautions;Pain       PT Treatment Interventions DME instruction;Gait training;Functional mobility training;Therapeutic activities;Therapeutic exercise;Balance training;Neuromuscular re-education;Patient/family education    PT Goals (Current goals can be found in the Care Plan section)  Acute Rehab PT Goals Patient Stated Goal: to go home PT Goal Formulation: With patient Time For Goal Achievement: 01/22/24 Potential to Achieve Goals: Fair    Frequency 7X/week     Co-evaluation               AM-PAC PT 6 Clicks  Mobility  Outcome Measure Help needed turning from your back to your side while in a flat bed without using bedrails?: None Help needed moving from lying on your back to sitting on the side of a flat bed without using bedrails?: A Little Help needed moving to and from a bed to a chair (including a wheelchair)?: A Little Help needed standing up from a chair using your arms (e.g., wheelchair or bedside chair)?: A Little Help needed to walk in hospital room?: A Little Help needed climbing 3-5 steps with a railing? : A Lot 6 Click Score: 18    End of Session Equipment Utilized During Treatment: Gait belt Activity Tolerance: Patient tolerated treatment well Patient left: in chair;with call bell/phone within reach;with nursing/sitter in room;with family/visitor present Nurse Communication: Mobility status PT Visit Diagnosis: Unsteadiness on feet (R26.81);Other abnormalities of gait and mobility (R26.89);History of falling (Z91.81);Difficulty in walking, not elsewhere classified (R26.2);Pain Pain - part of body:  (neck)    Time: 8480-8462 PT Time Calculation (min) (ACUTE ONLY): 18 min   Charges:   PT Evaluation $PT Eval Low Complexity: 1 Low PT Treatments $Therapeutic Activity: 8-22 mins PT  General Charges $$ ACUTE PT VISIT: 1 Visit         Sylvestre Rathgeber, SPT

## 2024-01-08 NOTE — Progress Notes (Signed)
 Patient awake/alert x4.  Up into recliner chair with one assist. Tolerated applesauce, apple juice without event. Discussed food choices next 24hrs: soft, verbalizes understanding.  Voided using urinal: PT worked with patient prior to discharge.  Family at bedside during PT. Vitals stable:  bp is labile, is on medications, instructed to take medications when he gets home, verbalizes understanding.  To be discharged to home per neuro.

## 2024-01-08 NOTE — Progress Notes (Signed)
 OT Screen Note  Patient Details Name: Scott [name] MRN: [id number] DOB: 1961/01/18   Cancelled Treatment:    Reason Eval/Treat Not Completed: OT screened, no needs identified, will sign off. Consult received, chart reviewed. Pt provided with post-cervical procedure handout by PT. Discharging home. No acute OT needs at this time.    Jonnathan Birman R., MPH, MS, OTR/L ascom [phone] 01/08/24, 4:05 PM

## 2024-01-08 NOTE — Anesthesia Procedure Notes (Addendum)
 Procedure Name: Intubation Date/Time: 01/08/2024 8:28 AM  Performed by: Lorrene Camelia LABOR, CRNAPre-anesthesia Checklist: Patient identified, Patient being monitored, Timeout performed, Emergency Drugs available and Suction available Patient Re-evaluated:Patient Re-evaluated prior to induction Oxygen Delivery Method: Circle system utilized Preoxygenation: Pre-oxygenation with 100% oxygen Induction Type: IV induction Ventilation: Mask ventilation without difficulty Laryngoscope Size: 3 and McGrath Grade View: Grade I Tube type: Oral Tube size: 7.5 mm Number of attempts: 1 Airway Equipment and Method: Stylet Placement Confirmation: ETT inserted through vocal cords under direct vision, positive ETCO2 and breath sounds checked- equal and bilateral Secured at: 21 cm Tube secured with: Tape Dental Injury: Teeth and Oropharynx as per pre-operative assessment and Injury to lip

## 2024-01-08 NOTE — Anesthesia Procedure Notes (Signed)
 Arterial Line Insertion Start/End9/18/2025 8:34 AM Performed by: Leavy Ned, MD, CRNA  Patient location: OR. Preanesthetic checklist: patient identified, IV checked, site marked, risks and benefits discussed, surgical consent, monitors and equipment checked, pre-op evaluation, timeout performed and anesthesia consent Lidocaine  1% used for infiltration radial was placed Catheter size: 20 G Hand hygiene performed  and maximum sterile barriers used   Attempts: 1 Procedure performed using ultrasound guided technique. Ultrasound Notes:anatomy identified, needle tip was noted to be adjacent to the nerve/plexus identified and no ultrasound evidence of intravascular and/or intraneural injection Following insertion, dressing applied. Post procedure assessment: normal and unchanged

## 2024-01-08 NOTE — Telephone Encounter (Signed)
 Per Dr Claudene, carpal tunnel surgery was canceled this morning because Mr Scott [name] fell and has a scrape on his hand close to where his incision would be. Cervical fusion was completed due to his functional decline.  Per discussion with Dr Claudene, we will wait to reschedule his carpal tunnel surgery at this time; timing of rescheduling this procedure will be discussed at his cervical fusion post op appointments.

## 2024-01-08 NOTE — Discharge Instructions (Addendum)
 Your surgeon has performed an operation on your cervical spine (neck) to relieve pressure on the spinal cord and/or nerves. This involved making an incision in the front of your neck and removing one or more of the discs that support your spine. Next, a small piece of bone, a titanium plate, and screws were used to fuse two or more of the vertebrae (bones) together.  The following are instructions to help in your recovery once you have been discharged from the hospital. Even if you feel well, it is important that you follow these activity guidelines. If you do not let your neck heal properly from the surgery, you can increase the chance of return of your symptoms and other complications.  * Do not take anti-inflammatory medications for 3 months after surgery (naproxen  [Aleve ], ibuprofen  [Advil , Motrin ], celecoxib [Celebrex], etc.). These medications can prevent your bones from healing properly.  Activity    No bending, lifting, or twisting ("BLT"). Avoid lifting objects heavier than 10 pounds (gallon milk jug).  Where possible, avoid household activities that involve lifting, bending, reaching, pushing, or pulling such as laundry, vacuuming, grocery shopping, and childcare. Try to arrange for help from friends and family for these activities while your back heals.  Increase physical activity slowly as tolerated.  Taking short walks is encouraged, but avoid strenuous exercise. Do not jog, run, bicycle, lift weights, or participate in any other exercises unless specifically allowed by your doctor.  Talk to your doctor before resuming sexual activity.  You should not drive until cleared by your doctor.  Until released by your doctor, you should not return to work or school.  You should rest at home and let your body heal.   You may shower three days after your surgery.  After showering, lightly dab your incision dry. Do not take a tub bath or go swimming until approved by your doctor at your  follow-up appointment.  If your doctor ordered a cervical collar (neck brace) for you, you should wear it whenever you are out of bed. You may remove it when lying down or sleeping, but you should wear it at all other times. Not all neck surgeries require a cervical collar.  If you smoke, we strongly recommend that you quit.  Smoking has been proven to interfere with normal bone healing and will dramatically reduce the success rate of your surgery. Please contact [facility] (800-QUIT-NOW) and use the resources at www.[facility].com for assistance in stopping smoking.  Surgical Incision   If you have a dressing on your incision, you may remove it two days after your surgery. Keep your incision area clean and dry.  If you have staples or stitches on your incision, you should have a follow up scheduled for removal. If you do not have staples or stitches, you will have steri-strips (small pieces of surgical tape) or Dermabond glue. The steri-strips/glue should begin to peel away within about a week (it is fine if the steri-strips fall off before then). If the strips are still in place one week after your surgery, you may gently remove them.  Diet    and Medicine       You may return to your usual diet. However, you may experience discomfort when swallowing in the first month after your surgery. This is normal. You may find that softer foods are more comfortable for you to swallow. Be sure to stay hydrated.  *You have been prescribed narcotic pain medications.  This often will cause constipation along with the  anesthesia that you underwent.  Please obtain Colace and senna. This has been sent to your local pharmacy, but at times it is not covered by insurance and you may obtain it over the counter..  You should take a stool softener and laxative for the duration of you taking the narcotic pain medications.  *You have been prescribed antibiotics for 3 days to decrease risk of infection   When to Contact  Us   You may experience pain in your neck and/or pain between your shoulder blades. This is normal and should improve in the next few weeks with the help of pain medication, muscle relaxers, and rest. Some patients report that a warm compress on the back of the neck or between the shoulder blades helps.  However, should you experience any of the following, contact us  immediately: New numbness or weakness Pain that is progressively getting worse, and is not relieved by your pain medication, muscle relaxers, rest, and warm compresses Bleeding, redness, swelling, pain, or drainage from surgical incision Chills or flu-like symptoms Fever greater than 101.0 F (38.3 C) Inability to eat, drink fluids, or take medications Problems with bowel or bladder functions Difficulty breathing or shortness of breath Warmth, tenderness, or swelling in your calf Contact Information How to contact us :  If you have any questions/concerns before or after surgery, you can reach us  at [phone], or you can send a mychart message. We can be reached by phone or mychart 8am-4pm, Monday-Friday.  *Please note: Calls after 4pm are forwarded to a third party answering service. Mychart messages are not routinely monitored during evenings, weekends, and holidays. Please call our office to contact the answering service for urgent concerns during non-business hours.

## 2024-01-08 NOTE — Anesthesia Preprocedure Evaluation (Signed)
 Anesthesia Evaluation  Patient identified by MRN, date of birth, ID band Patient awake    Reviewed: Allergy & Precautions, NPO status , Patient's Chart, lab work & pertinent test results  History of Anesthesia Complications Negative for: history of anesthetic complications  Airway Mallampati: III  TM Distance: >3 FB Neck ROM: full    Dental  (+) Chipped   Pulmonary COPD,  COPD inhaler, Current Smoker   Pulmonary exam normal        Cardiovascular hypertension, On Medications (-) angina + CAD and + Peripheral Vascular Disease  (-) DOE Normal cardiovascular exam  Hx of DVT AAA   Neuro/Psych  PSYCHIATRIC DISORDERS Anxiety Depression    negative neurological ROS     GI/Hepatic Neg liver ROS,GERD  Medicated,,  Endo/Other  negative endocrine ROS    Renal/GU      Musculoskeletal   Abdominal   Peds  Hematology negative hematology ROS (+)   Anesthesia Other Findings Patient with high blood pressure of 185/100 today. Patient states his blood pressure normal runs around 140/85. He didn't take his blood pressure medications this morning and states he feels very anxious.   Note from Dorise Pereyra:  Scott [name] is a 63 y.o. male who is submitted for pre-surgical anesthesia review and clearance prior to him undergoing the above procedure.  Patient is a Current Smoker. Pertinent PMH includes: CAD, diastolic dysfunction, AAA, aortic root dilatation, SVT (s/p ablation), DVT, PVD (s/p BILATERAL femoral endarterectomies), BILATERAL carotid artery disease, chronic encephalomalacia (LEFT inferior temporal gyrus), aortic atherosclerosis, angina, ILBBB, HTN, HLD, prediabetes, dyspnea, COPD, pulmonary nodules, GERD (on daily H2 blocker), OA, carpal tunnel syndrome, cervical spinal stenosis with myelopathy, depression, anxiety, ETOH abuse.     Patient is followed by cardiology Philippe, MD). He was last seen in the cardiology clinic on  07/31/2023; notes reviewed. At the time of his clinic visit, patient doing well overall from a cardiovascular perspective. Patient denied any chest pain, shortness of breath, PND, orthopnea, palpitations, significant peripheral edema, weakness, fatigue, vertiginous symptoms, or presyncope/syncope. Patient with a past medical history significant for cardiovascular diagnoses. Documented physical exam was grossly benign, providing no evidence of acute exacerbation and/or decompensation of the patient's known cardiovascular conditions.    Patient underwent diagnostic LEFT heart catheterization on 06/03/2017 revealing minor single-vessel CAD.  There was a 25% lesion noted in the proximal LAD.  Given the nonobstructive nature of his coronary artery disease, the decision was made to defer intervention opting for medical management.    Patient with a history of SVT.  Definitive treatment with ablation procedure was performed on 02/28/2016; no recurrence.    Most recent TTE performed on 12/11/2020 revealed a mildly reduced left ventricular systolic function with an EF of 45%.  Left ventricle mildly enlarged.  There was global hypokinesis.  Left ventricular diastolic Doppler parameters consistent with abnormal relaxation (G1DD). Right ventricular size and function normal. RVSP =36.1 mmHg.  There was trivial mitral, tricuspid, and pulmonary valve regurgitation.  All transvalvular gradients were noted to be normal providing no evidence of hemodynamically significant valvular stenosis. There was mild dilation of the aortic root measuring up to 4.2 cm.    Most recent myocardial perfusion imaging study was performed on 12/11/2020 revealing a low normal left ventricular systolic function with an EF of 53%.  There were no regional wall motion abnormalities.  No artifact or left ventricular cavity size enlargement appreciated on review of imaging. SPECT images demonstrated a small mixed perfusion abnormality of mild  intensity  present in the inferior apical region on stress images. TID ratio = 0.83 (normal range </= 1.2).  There was no clear evidence of reversible ischemia.  Repeat cardiac catheterization was recommended for ongoing symptoms.    Repeat diagnostic LEFT heart catheterization was performed on 08/22/2021 revealing multivessel CAD; 25% proximal LAD, 15% ostial-mid LM, 25% LM to proximal LAD, 25% ostial-mid LCx, and 25% ostial OM2.  Again, given the nonobstructive nature of his coronary artery disease, intervention was deferred opting for continued medical management.    Patient with a known infrarenal AAA dating back to at least 02/2020, at which time aneurysmal defect measured 3.8 cm.  Since that time, defect has been serially monitored for progression.  PET/CT was performed on 10/01/2022 revealing interval increase in size to 4.3 cm.  Follow-up AAA duplex was performed on 11/26/2022 demonstrating minimal increase in size to 4.4 cm.  Most recent CTA imaging of the abdomen and pelvis on 11/20/2023 revealed further interval increase in size of aneurysmal defect of 4.8 x 4.6 cm.   Blood pressure reasonably controlled at 140/88 mmHg on currently prescribed alpha-blocker (clonidine ), ACEi/diuretic (lisinopril -HCTZ), and beta-blocker (metoprolol  succinate) therapies.  Patient is on atorvastatin  for his HLD diagnosis and ASCVD prevention.  Patient has a prediabetes diagnosis that he is managing with diet and lifestyle modifications.  Most recent hemoglobin A1c was 5.8% when checked on 10/30/2023.  Patient does not have an OSAH diagnosis. Patient is able to complete all of his  ADL/IADLs without cardiovascular limitation.  Per the [facility], patient is able to achieve at least 4 METS of physical activity without experiencing any significant degree of angina/anginal equivalent symptoms. No changes were made to his medication regimen during his visit with cardiology.  Patient scheduled to follow-up with outpatient  cardiology in 6 months or sooner if needed.   Scott [name] is scheduled for an elective ANTERIOR CERVICAL DECOMPRESSION/DISCECTOMY FUSION 1 LEVEL; CARPAL TUNNEL RELEASE on 01/09/2024 with Dr. Penne LELON Sharps, MD. Given patient's past medical history significant for cardiovascular diagnoses, presurgical cardiac clearance was sought by the PAT team. Per cardiology, this patient is optimized for surgery and may proceed with the planned procedural course with a LOW risk of significant perioperative cardiovascular complications.   Again, this patient is on daily oral antithrombotic therapy. He has been instructed on recommendations for holding his clopidogrel  for 7 days prior to his procedure with plans to restart as soon as postoperative bleeding risk felt to be minimized by his primary attending surgeon. The patient has been instructed that his last dose should be on 01/01/2024. Given that patient's past medical history is significant for cardiovascular diagnoses, including but not limited to CAD, neurosurgery has cleared patient to continue his daily low dose ASA throughout his perioperative course. He will be asked to hold his normal dose on the day of his procedure only. Patient has been updated on these directives from his specialty care providers by the PAT team.   Reproductive/Obstetrics negative OB ROS                              Anesthesia Physical Anesthesia Plan  ASA: 3  Anesthesia Plan: General ETT   Post-op Pain Management:    Induction: Intravenous  PONV Risk Score and Plan: 2 and Ondansetron , Dexamethasone  and Midazolam   Airway Management Planned: Oral ETT  Additional Equipment:   Intra-op Plan:   Post-operative Plan: Extubation in OR  Informed Consent: I have reviewed  the patients History and Physical, chart, labs and discussed the procedure including the risks, benefits and alternatives for the proposed anesthesia with the patient or authorized  representative who has indicated his/her understanding and acceptance.     Dental Advisory Given  Plan Discussed with: Anesthesiologist, CRNA and Surgeon  Anesthesia Plan Comments: (Patient consented for risks of anesthesia including but not limited to:  - adverse reactions to medications - damage to eyes, teeth, lips or other oral mucosa - nerve damage due to positioning  - sore throat or hoarseness - Damage to heart, brain, nerves, lungs, other parts of body or loss of life  Patient voiced understanding and assent.)        Anesthesia Quick Evaluation

## 2024-01-08 NOTE — Op Note (Signed)
 Indications: 63 year old man with a history of rapidly progressive cervical myelopathy and multilevel cervical spondylosis.  Area of most critical stenosis was at C3-4 with spondylolisthesis and T2 signal change at C3-4.  We planned for a anterior approach to the C3-4 anterior cervical discectomy and fusion  Findings: Significant hypermobility noted at the C3-4 interval.  Severe stenosis well decompressed at the end of the procedure.    Preoperative Diagnosis:  M48.02, G99.2 Stenosis of cervical spine with myelopathy M43.12 Acquired spondylolisthesis of cervical vertebra G56.02 Carpal tunnel syndrome on left  Postoperative Diagnosis: M48.02, G99.2 Stenosis of cervical spine with myelopathyM43.12 Acquired spondylolisthesis of cervical vertebraG56.02 Carpal tunnel syndrome on left   EBL: 20 ml IVF: See anesthesia report ml Drains: none Disposition: Extubated and Stable to PACU Complications: none  No foley catheter was placed.   Preoperative Note:    Risks of surgery discussed include: infection, bleeding, stroke, coma, death, paralysis, CSF leak, nerve/spinal cord injury, numbness, tingling, weakness, complex regional pain syndrome, recurrent stenosis and/or disc herniation, vascular injury, development of instability, neck/back pain, need for further surgery, persistent symptoms, development of deformity, and the risks of anesthesia. The patient understood these risks and agreed to proceed.  Procedure:  1) Anterior cervical diskectomy and fusion at C 3-4 2) Anterior cervical instrumentation at C 3-4 3) Structural allograft consisting of corticocancellous allograft   Procedure: After obtaining informed consent, the patient taken to the operating room, placed in supine position, general anesthesia induced.  The patient had a small shoulder roll placed behind their shoulders.  The patient received preop antibiotics and IV Decadron .  The patient had a neck incision outlined, was prepped  and draped in usual sterile fashion. The incision was injected with local anesthetic.   An incision was opened, dissection taken down medial to the carotid artery and jugular vein, lateral to the trachea and esophagus.  The prevertebral fascia identified and a localizing x-ray demonstrated the correct level.  The longus colli were dissected laterally, and self-retaining retractors placed to open the operative field. The microscope was then brought into the field.  With this complete, distractor pins were placed in the vertebral bodies of C 3 and C 4. The distractor was placed, and the annulus at C 3/4 was opened using a bovie.  Curettes and pituitary rongeurs used to remove the majority of disk, then the drill was used to remove the posterior osteophyte and begin the foraminotomies. The nerve hook was used to elevate the posterior longitudinal ligament, which was then removed with Kerrison rongeurs. The microblunt nerve hook could be passed out the foramen bilaterally.   Meticulous hemostasis obtained.  Structural allograft was tapped behind the anterior lip of the vertebral body at C 3/4 (7 mm).    The caspar distractor was removed, and bone wax used for hemostasis. A separate, 18 mm  plate was chosen.  Two screws placed in each vertebral body, respectively making sure the screws were behind the locking mechanism.  Final AP and lateral radiographs were taken.   With everything in good position, the wound was irrigated copiously and meticulous hemostasis obtained.  Wound was closed in 2 layers using interrupted inverted 3-0 Vicryl sutures.  The wound was dressed with dermabond, the head of bed at 30 degrees, taken to recovery room in stable condition.  No new postop neurological deficits were identified.  Sponge and pattie counts were correct at the end of the procedure.   Intraoperative monitoring was utilized during the case.  Showed significant deficits  preoperatively, was stable at the end of the  procedure.  I performed the procedure with the assistance of [employer], they aided in the exposure, retraction of critical structures, visualization of critical structures, stabilization during instrumentation, and closure.  Implants: Implant Name Type Inv. Item Serial No. Manufacturer Lot No. LRB No. Used Action  SPACER CERVICAL FRGE 12X14X7-7 - [id number] Spacer SPACER CERVICAL FRGE [id number] [employer] DON[employer] ID [id number] N/A 1 Implanted  PLATE ACP 1.6 18 - [id number] Plate PLATE ACP 1.6 18  [employer]  N/A 1 Implanted  SCREW ACP 3.5X17 - [id number] Screw SCREW ACP 3.5X17  [employer]  N/A 4 Implanted      Penne MICAEL Sharps, MD/MSCR

## 2024-01-09 ENCOUNTER — Encounter: Payer: Self-pay | Admitting: Neurosurgery

## 2024-01-09 NOTE — Telephone Encounter (Signed)
Please let the patient know that his medication has been sent.

## 2024-01-12 ENCOUNTER — Emergency Department

## 2024-01-12 ENCOUNTER — Other Ambulatory Visit: Payer: Self-pay

## 2024-01-12 ENCOUNTER — Inpatient Hospital Stay
Admission: EM | Admit: 2024-01-12 | Discharge: 2024-01-13 | DRG: 176 | Disposition: A | Discharge status: Home / Self Care | Attending: Internal Medicine | Admitting: Internal Medicine

## 2024-01-12 ENCOUNTER — Telehealth: Payer: Self-pay

## 2024-01-12 DIAGNOSIS — R062 Wheezing: Secondary | ICD-10-CM | POA: Diagnosis not present

## 2024-01-12 DIAGNOSIS — F10929 Alcohol use, unspecified with intoxication, unspecified: Secondary | ICD-10-CM | POA: Diagnosis not present

## 2024-01-12 DIAGNOSIS — I2699 Other pulmonary embolism without acute cor pulmonale: Secondary | ICD-10-CM | POA: Diagnosis not present

## 2024-01-12 DIAGNOSIS — R0602 Shortness of breath: Secondary | ICD-10-CM | POA: Diagnosis not present

## 2024-01-12 DIAGNOSIS — J449 Chronic obstructive pulmonary disease, unspecified: Secondary | ICD-10-CM | POA: Diagnosis not present

## 2024-01-12 DIAGNOSIS — Z9049 Acquired absence of other specified parts of digestive tract: Secondary | ICD-10-CM

## 2024-01-12 DIAGNOSIS — I2693 Single subsegmental pulmonary embolism without acute cor pulmonale: Secondary | ICD-10-CM | POA: Diagnosis not present

## 2024-01-12 DIAGNOSIS — Z5986 Financial insecurity: Secondary | ICD-10-CM

## 2024-01-12 DIAGNOSIS — F17210 Nicotine dependence, cigarettes, uncomplicated: Secondary | ICD-10-CM | POA: Diagnosis present

## 2024-01-12 DIAGNOSIS — Z7901 Long term (current) use of anticoagulants: Secondary | ICD-10-CM

## 2024-01-12 DIAGNOSIS — Z86718 Personal history of other venous thrombosis and embolism: Secondary | ICD-10-CM

## 2024-01-12 DIAGNOSIS — Z8269 Family history of other diseases of the musculoskeletal system and connective tissue: Secondary | ICD-10-CM

## 2024-01-12 DIAGNOSIS — J439 Emphysema, unspecified: Secondary | ICD-10-CM | POA: Diagnosis not present

## 2024-01-12 DIAGNOSIS — W19XXXA Unspecified fall, initial encounter: Secondary | ICD-10-CM | POA: Diagnosis not present

## 2024-01-12 DIAGNOSIS — E785 Hyperlipidemia, unspecified: Secondary | ICD-10-CM | POA: Diagnosis present

## 2024-01-12 DIAGNOSIS — R531 Weakness: Secondary | ICD-10-CM | POA: Diagnosis not present

## 2024-01-12 DIAGNOSIS — G629 Polyneuropathy, unspecified: Secondary | ICD-10-CM | POA: Diagnosis not present

## 2024-01-12 DIAGNOSIS — Z981 Arthrodesis status: Secondary | ICD-10-CM | POA: Diagnosis not present

## 2024-01-12 DIAGNOSIS — I2510 Atherosclerotic heart disease of native coronary artery without angina pectoris: Secondary | ICD-10-CM | POA: Diagnosis not present

## 2024-01-12 DIAGNOSIS — Z833 Family history of diabetes mellitus: Secondary | ICD-10-CM

## 2024-01-12 DIAGNOSIS — Z8616 Personal history of COVID-19: Secondary | ICD-10-CM

## 2024-01-12 DIAGNOSIS — I700 Atherosclerosis of aorta: Secondary | ICD-10-CM | POA: Diagnosis not present

## 2024-01-12 DIAGNOSIS — Z888 Allergy status to other drugs, medicaments and biological substances status: Secondary | ICD-10-CM

## 2024-01-12 DIAGNOSIS — M5412 Radiculopathy, cervical region: Secondary | ICD-10-CM | POA: Diagnosis not present

## 2024-01-12 DIAGNOSIS — Z7951 Long term (current) use of inhaled steroids: Secondary | ICD-10-CM

## 2024-01-12 DIAGNOSIS — M4800 Spinal stenosis, site unspecified: Secondary | ICD-10-CM

## 2024-01-12 DIAGNOSIS — M7989 Other specified soft tissue disorders: Secondary | ICD-10-CM | POA: Diagnosis not present

## 2024-01-12 DIAGNOSIS — Z8249 Family history of ischemic heart disease and other diseases of the circulatory system: Secondary | ICD-10-CM

## 2024-01-12 DIAGNOSIS — M4802 Spinal stenosis, cervical region: Secondary | ICD-10-CM | POA: Diagnosis present

## 2024-01-12 DIAGNOSIS — Z7982 Long term (current) use of aspirin: Secondary | ICD-10-CM

## 2024-01-12 DIAGNOSIS — I10 Essential (primary) hypertension: Secondary | ICD-10-CM | POA: Diagnosis present

## 2024-01-12 LAB — BASIC METABOLIC PANEL WITH GFR
Anion gap: 14 (ref 5–15)
BUN: 10 mg/dL (ref 8–23)
CO2: 24 mmol/L (ref 22–32)
Calcium: 8.9 mg/dL (ref 8.9–10.3)
Chloride: 91 mmol/L — ABNORMAL LOW (ref 98–111)
Creatinine, Ser: 0.52 mg/dL — ABNORMAL LOW (ref 0.61–1.24)
GFR, Estimated: >60 mL/min (ref >60)
Glucose, Bld: 93 mg/dL (ref 70–99)
Potassium: 3.8 mmol/L (ref 3.5–5.1)
Sodium: 129 mmol/L — ABNORMAL LOW (ref 135–145)

## 2024-01-12 LAB — CBC
HCT: 40.8 % (ref 39.0–52.0)
Hemoglobin: 14.6 g/dL (ref 13.0–17.0)
MCH: 34.6 pg — ABNORMAL HIGH (ref 26.0–34.0)
MCHC: 35.8 g/dL (ref 30.0–36.0)
MCV: 96.7 fL (ref 80.0–100.0)
Platelets: 226 K/uL (ref 150–400)
RBC: 4.22 MIL/uL (ref 4.22–5.81)
RDW: 12.3 % (ref 11.5–15.5)
WBC: 8.5 K/uL (ref 4.0–10.5)
nRBC: 0 % (ref 0.0–0.2)

## 2024-01-12 MED ORDER — IPRATROPIUM-ALBUTEROL 0.5-2.5 (3) MG/3ML IN SOLN
3.0000 mL | Freq: Once | RESPIRATORY_TRACT | Status: AC
  Administered 2024-01-12: 3 mL via RESPIRATORY_TRACT
  Filled 2024-01-12: qty 3

## 2024-01-12 MED ORDER — SODIUM CHLORIDE 0.9 % IV BOLUS
1000.0000 mL | Freq: Once | INTRAVENOUS | Status: AC
  Administered 2024-01-12: 1000 mL via INTRAVENOUS

## 2024-01-12 MED ORDER — HEPARIN (PORCINE) 25000 UT/250ML-% IV SOLN
1300.0000 [IU]/h | INTRAVENOUS | Status: DC
  Administered 2024-01-13: 1150 [IU]/h via INTRAVENOUS
  Filled 2024-01-12: qty 250

## 2024-01-12 MED ORDER — IOHEXOL 350 MG/ML SOLN
100.0000 mL | Freq: Once | INTRAVENOUS | Status: AC | PRN
Start: 2024-01-12 — End: 2024-01-12
  Administered 2024-01-12: 100 mL via INTRAVENOUS

## 2024-01-12 MED ORDER — HEPARIN BOLUS VIA INFUSION
4200.0000 [IU] | Freq: Once | INTRAVENOUS | Status: AC
  Administered 2024-01-13: 4200 [IU] via INTRAVENOUS
  Filled 2024-01-12: qty 4200

## 2024-01-12 NOTE — ED Provider Notes (Signed)
 [facility] Provider Note    Event Date/Time   First MD Initiated Contact with Patient 01/12/24 1942     (approximate)  History   Chief Complaint: Shortness of Breath, Fall, and Post-op Problem  HPI  Scott [name] is a 63 y.o. male with a past medical history of anxiety, CAD, COPD, hypertension, hyperlipidemia, spinal stenosis status post decompression and fusion 4 days ago presents to the emergency department for worsening weakness as well as shortness of breath.  Patient states since his surgery 4 days ago he has had progressive worsening of his upper and lower extremity weakness.  Patient admits he was having pretty significant weakness before the surgery but he feels like they have gotten worse.  Patient fell today and was unable to get up so he called EMS who brought him to the emergency department.  As a secondary complaint patient is also complaining of shortness of breath which she states has been worsening over the last few days as well.  Patient has a known left lower extremity DVT but states he has not been taking his Eliquis  due to financial issues.  Physical Exam   Triage Vital Signs: ED Triage Vitals  Encounter Vitals Group     BP 01/12/24 1822 (!) 142/104     Girls Systolic BP Percentile --      Girls Diastolic BP Percentile --      Boys Systolic BP Percentile --      Boys Diastolic BP Percentile --      Pulse Rate 01/12/24 1822 (!) 109     Resp 01/12/24 1822 17     Temp 01/12/24 1822 98.4 F (36.9 C)     Temp src --      SpO2 01/12/24 1822 95 %     Weight --      Height --      Head Circumference --      Peak Flow --      Pain Score 01/12/24 1820 9     Pain Loc --      Pain Education --      Exclude from Growth Chart --     Most recent vital signs: Vitals:   01/12/24 1822  BP: (!) 142/104  Pulse: (!) 109  Resp: 17  Temp: 98.4 F (36.9 C)  SpO2: 95%    General: Awake, no distress.  CV:  Good peripheral perfusion.  Regular  rate and rhythm  Resp:  Normal effort.  Equal breath sounds bilaterally.  Abd:  No distention.  Soft, nontender.  Other:  Mild swelling of the left lower extremity normal-appearing right lower extremity. Patient has fairly equal grip strength in the upper extremities with good upper extremity strength.  He does appear to have some chronic contractures of the hands.  Patient has 4/5 strength in the left lower extremity and 5/5 in the right lower extremity.   ED Results / Procedures / Treatments   EKG  EKG viewed and interpreted by myself shows sinus tachycardia 103 bpm with a narrow QRS, left axis deviation, largely normal intervals with nonspecific but no concerning ST changes.  RADIOLOGY  I have reviewed and interpreted the CT chest images.  No obvious clot seen on my evaluation. Radiology has read the CT scan as positive for a small subsegmental left lower lobe PE.   MEDICATIONS ORDERED IN ED: Medications  ipratropium-albuterol  (DUONEB) 0.5-2.5 (3) MG/3ML nebulizer solution 3 mL (has no administration in time range)  ipratropium-albuterol  (DUONEB)  0.5-2.5 (3) MG/3ML nebulizer solution 3 mL (has no administration in time range)     IMPRESSION / MDM / ASSESSMENT AND PLAN / ED COURSE  I reviewed the triage vital signs and the nursing notes.  Patient's presentation is most consistent with acute presentation with potential threat to life or bodily function.  Patient presents to the emergency department for worsening generalized weakness and a fall unable to get himself up.  Patient denies hitting his head.  Denies LOC.  Patient's workup in the emergency department shows mild hyponatremia with a sodium of 129, although not significantly changed from the patient's baseline.  Patient's CBC overall reassuring.  Chest x-ray and elbow x-ray are negative for acute finding.  However given the patient's shortness of breath with a known left lower extremity DVT not on anticoagulation due to  finances we will obtain a CTA of the chest.  He does have slight end expiratory wheeze on exam we will dose DuoNebs while awaiting for the results.  I spoke to Dr. Clois of neurosurgery who recommends a repeat MRI without contrast of the cervical spine to evaluate.  Patient agreeable to plan of care and workup.  CT has resulted positive for a very small PE.  However patient has a known left lower extremity DVT and he is not anticoagulated.  Will start on IV heparin .  Patient will likely need social work assistance to afford an anticoagulant.  MRI has resulted still showing significant spinal stenosis.  I spoke to Dr. Katrina who will see the patient in the morning.  Patient admitted to the hospital service for further workup and treatment  CRITICAL CARE Performed by: Franky Moores   Total critical care time: 30 minutes  Critical care time was exclusive of separately billable procedures and treating other patients.  Critical care was necessary to treat or prevent imminent or life-threatening deterioration.  Critical care was time spent personally by me on the following activities: development of treatment plan with patient and/or surrogate as well as nursing, discussions with consultants, evaluation of patient's response to treatment, examination of patient, obtaining history from patient or surrogate, ordering and performing treatments and interventions, ordering and review of laboratory studies, ordering and review of radiographic studies, pulse oximetry and re-evaluation of patient's condition.   FINAL CLINICAL IMPRESSION(S) / ED DIAGNOSES   Dyspnea Weakness Pulmonary embolism Spinal stenosis  Note:  This document was prepared using Dragon voice recognition software and may include unintentional dictation errors.   Moores Franky, MD 01/12/24 2329

## 2024-01-12 NOTE — ED Notes (Signed)
 Patient requesting water . This RN at bedside with cup of water . Pt drink water  and immediately spit up water  and began to choke. This RN suctioned pt. Pt VSS. ED provider made aware. Pt informed that he will not be given anymore water  while in ED.

## 2024-01-12 NOTE — ED Triage Notes (Signed)
 Pt comes via EMS from home with sob. Pt stats he also had fall. Pt neuropathy in hands. Pt has recent procedure for hands . Pt injured rib in past week. Pt stats right elbow skin tear today. Pt has hx of copd. Pt has rhonchi and wheezing in all field per ems. 20 in right wrist 1 duoneb given with improvements  195/101 ST 106 with pvcs 97% RA after duoneb CBG 85 ETCO2 34

## 2024-01-12 NOTE — Patient Outreach (Signed)
 Patient not able to complete assessment on the phone today, he had surgery 01/08/24 and needed to reach out to surgeon's office for health advice.  Made plans to call back in two days.

## 2024-01-12 NOTE — ED Triage Notes (Addendum)
 Pt reports after having cervical fusion on the 18th, he has been experiencing sob and generalized weakness since the procedure. Pt states the numbness and weakness in all of his extremities were present prior to the procedure but have gotten worse. Pt arrives AxOx4.Reports several falls recently. Denies hitting his head. He has not been taking his plavix  or eliquis . Pt c/o pain to right side of ribs and left elbow from the falls.

## 2024-01-12 NOTE — Progress Notes (Signed)
 PHARMACY - ANTICOAGULATION CONSULT NOTE  Pharmacy Consult for Heparin   Indication: pulmonary embolus  Allergies  Allergen Reactions   Acetaminophen -Codeine Other (See Comments)    Nausea and hot flash    Chantix [Varenicline] Nausea Only    Sour taste in mouth   Codeine Nausea Only   Gabapentin Swelling   Pregabalin  Swelling    Patient Measurements:    Vital Signs: Temp: 98.6 F (37 C) (09/22 2212) Temp Source: Oral (09/22 2212) BP: 165/97 (09/22 2212) Pulse Rate: 100 (09/22 2212)  Labs: Recent Labs    01/12/24 1827  HGB 14.6  HCT 40.8  PLT 226  CREATININE 0.52*    Estimated Creatinine Clearance: 94.6 mL/min (A) (by C-G formula based on SCr of 0.52 mg/dL (L)).   Medical History: Past Medical History:  Diagnosis Date   AAA (abdominal aortic aneurysm) without rupture 02/23/2020   a.) AAA duplex 02/23/2020: 3.8 cm; b.) AAA duplex 11/28/2020: 3.7 cm; c.) CTA AO + BIFEM 12/18/2020: 3.8 x 3.4 cm; d.) AAA duplex 11/07/2021: 3.7 cm; e.) CTA AO + BIFEM 11/16/2021: 4.2 cm; f.) AAA duplex 05/28/2022: 4.0 cm; g.) AAA duplex 11/26/2022: 4.4 cm; h.) PET CT 10/01/2022: 4.3 cm; i.) AAA duplex 08/06/2023: 4.8 cm; j.) CTA abd/pel 11/20/2023: 4.8 x 4.6 cm   Alcoholism (HCC)    Anginal pain    Anxiety    Aortic atherosclerosis    Aortic root dilatation    Arthritis    Bilateral carotid artery disease 06/20/2020   a.) carotid doppler 06/20/2020: 1-39% RICA, 40-59% LICA; b.) carotid doppler 11/28/2020, 11/26/2022: 1-39% BICA   Bilateral inguinal hernia    CAD (coronary artery disease) 06/03/2017   a.) LHC 06/03/2017: 25% pLAD - med mgmt; b.) LHC 08/22/2021: 25% pLAD, 15% o-mLM, 25% dLM-pLAD, 25% o-mLCx, 25% oD2 - med mgmt   Carpal tunnel syndrome    Cigarette smoker    COPD (chronic obstructive pulmonary disease) (HCC)    COVID-19 2022   Deep vein thrombosis (DVT) of left lower extremity (HCC)    Depression    Diastolic dysfunction    Diverticulosis    Dupuytren  contracture of both hands    Dyspnea    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    a.) small area of chronic encephalomalacia in the LEFT inferior temporal gyrus   GERD (gastroesophageal reflux disease)    Hepatic steatosis    HLD (hyperlipidemia)    Hypertension    Incomplete left bundle branch block (LBBB)    Lung nodules    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   a.) PET CT 10/01/2022: 6 mm with SUV max of 6.4; b.) s.p FNA 11/05/2022 --> pathology negative for malignancy (DDx oncocytosis vs oncocytoma)   PAOD (peripheral arterial occlusive disease)    a.) s/p BILATERAL femoral endarterectomies 12/29/2020; b.) s/p aorto-bifemoral bypass   Pre-diabetes    Stenosis of cervical spine with myelopathy (HCC)    SVT (supraventricular tachycardia)    a.) s/p ablation 02/28/2016   Vitamin B12 deficiency    Vitamin D deficiency     Medications:  (Not in a hospital admission)   Assessment: Pharmacy consulted to dose heparin  in this 63 year old male admitted with PE.  No prior anticoag noted. CrCl = 94.6 ml/min  Goal of Therapy:  Heparin  level 0.3-0.7 units/ml Monitor platelets by anticoagulation protocol: Yes   Plan:  Give 4200 units bolus x 1 Start heparin  infusion at 1150 units/hr Check anti-Xa level in 6 hours and daily while on heparin  Continue  to monitor H&H and platelets  Scott [name] D 01/12/2024,11:59 PM

## 2024-01-13 ENCOUNTER — Other Ambulatory Visit (HOSPITAL_COMMUNITY): Payer: Self-pay

## 2024-01-13 ENCOUNTER — Telehealth: Payer: Self-pay

## 2024-01-13 ENCOUNTER — Telehealth (HOSPITAL_COMMUNITY): Payer: Self-pay | Admitting: Pharmacy Technician

## 2024-01-13 ENCOUNTER — Other Ambulatory Visit: Payer: Self-pay

## 2024-01-13 DIAGNOSIS — Z8616 Personal history of COVID-19: Secondary | ICD-10-CM | POA: Diagnosis not present

## 2024-01-13 DIAGNOSIS — F17210 Nicotine dependence, cigarettes, uncomplicated: Secondary | ICD-10-CM | POA: Diagnosis not present

## 2024-01-13 DIAGNOSIS — R0602 Shortness of breath: Secondary | ICD-10-CM | POA: Diagnosis not present

## 2024-01-13 DIAGNOSIS — I700 Atherosclerosis of aorta: Secondary | ICD-10-CM | POA: Diagnosis not present

## 2024-01-13 DIAGNOSIS — I2699 Other pulmonary embolism without acute cor pulmonale: Secondary | ICD-10-CM | POA: Diagnosis present

## 2024-01-13 DIAGNOSIS — R062 Wheezing: Secondary | ICD-10-CM | POA: Diagnosis not present

## 2024-01-13 DIAGNOSIS — Z7901 Long term (current) use of anticoagulants: Secondary | ICD-10-CM | POA: Diagnosis not present

## 2024-01-13 DIAGNOSIS — Z7951 Long term (current) use of inhaled steroids: Secondary | ICD-10-CM | POA: Diagnosis not present

## 2024-01-13 DIAGNOSIS — Z7982 Long term (current) use of aspirin: Secondary | ICD-10-CM | POA: Diagnosis not present

## 2024-01-13 DIAGNOSIS — G629 Polyneuropathy, unspecified: Secondary | ICD-10-CM | POA: Diagnosis not present

## 2024-01-13 DIAGNOSIS — E785 Hyperlipidemia, unspecified: Secondary | ICD-10-CM | POA: Diagnosis not present

## 2024-01-13 DIAGNOSIS — J439 Emphysema, unspecified: Secondary | ICD-10-CM | POA: Diagnosis not present

## 2024-01-13 DIAGNOSIS — M4802 Spinal stenosis, cervical region: Secondary | ICD-10-CM | POA: Diagnosis not present

## 2024-01-13 DIAGNOSIS — Z833 Family history of diabetes mellitus: Secondary | ICD-10-CM | POA: Diagnosis not present

## 2024-01-13 DIAGNOSIS — R531 Weakness: Secondary | ICD-10-CM | POA: Diagnosis not present

## 2024-01-13 DIAGNOSIS — Z981 Arthrodesis status: Secondary | ICD-10-CM | POA: Diagnosis not present

## 2024-01-13 DIAGNOSIS — I2510 Atherosclerotic heart disease of native coronary artery without angina pectoris: Secondary | ICD-10-CM | POA: Diagnosis not present

## 2024-01-13 DIAGNOSIS — I2693 Single subsegmental pulmonary embolism without acute cor pulmonale: Secondary | ICD-10-CM | POA: Diagnosis not present

## 2024-01-13 DIAGNOSIS — Z8269 Family history of other diseases of the musculoskeletal system and connective tissue: Secondary | ICD-10-CM | POA: Diagnosis not present

## 2024-01-13 DIAGNOSIS — M7989 Other specified soft tissue disorders: Secondary | ICD-10-CM | POA: Diagnosis not present

## 2024-01-13 DIAGNOSIS — Z5986 Financial insecurity: Secondary | ICD-10-CM | POA: Diagnosis not present

## 2024-01-13 DIAGNOSIS — Z9049 Acquired absence of other specified parts of digestive tract: Secondary | ICD-10-CM | POA: Diagnosis not present

## 2024-01-13 DIAGNOSIS — Z888 Allergy status to other drugs, medicaments and biological substances status: Secondary | ICD-10-CM | POA: Diagnosis not present

## 2024-01-13 DIAGNOSIS — M5412 Radiculopathy, cervical region: Secondary | ICD-10-CM | POA: Diagnosis not present

## 2024-01-13 DIAGNOSIS — I10 Essential (primary) hypertension: Secondary | ICD-10-CM | POA: Diagnosis not present

## 2024-01-13 DIAGNOSIS — J449 Chronic obstructive pulmonary disease, unspecified: Secondary | ICD-10-CM | POA: Diagnosis not present

## 2024-01-13 DIAGNOSIS — Z86718 Personal history of other venous thrombosis and embolism: Secondary | ICD-10-CM | POA: Diagnosis not present

## 2024-01-13 DIAGNOSIS — Z8249 Family history of ischemic heart disease and other diseases of the circulatory system: Secondary | ICD-10-CM | POA: Diagnosis not present

## 2024-01-13 LAB — PROTIME-INR
INR: 1.1 (ref 0.8–1.2)
Prothrombin Time: 14.7 s (ref 11.4–15.2)

## 2024-01-13 LAB — HEPARIN LEVEL (UNFRACTIONATED)
Heparin Unfractionated: <0.1 [IU]/mL — ABNORMAL LOW (ref 0.30–0.70)
Heparin Unfractionated: 0.63 [IU]/mL (ref 0.30–0.70)
Heparin Unfractionated: 0.22 [IU]/mL — ABNORMAL LOW (ref 0.30–0.70)

## 2024-01-13 LAB — APTT: aPTT: 22 s — ABNORMAL LOW (ref 24–36)

## 2024-01-13 LAB — HIV ANTIBODY (ROUTINE TESTING W REFLEX): HIV Screen 4th Generation wRfx: NONREACTIVE

## 2024-01-13 MED ORDER — FLUTICASONE FUROATE-VILANTEROL 200-25 MCG/ACT IN AEPB
1.0000 | INHALATION_SPRAY | Freq: Every day | RESPIRATORY_TRACT | Status: DC
Start: 2024-01-13 — End: 2024-01-13
  Filled 2024-01-13: qty 28

## 2024-01-13 MED ORDER — HEPARIN BOLUS VIA INFUSION
1000.0000 [IU] | Freq: Once | INTRAVENOUS | Status: DC
  Filled 2024-01-13: qty 1000

## 2024-01-13 MED ORDER — CLONIDINE HCL 0.1 MG PO TABS
0.2000 mg | ORAL_TABLET | Freq: Every day | ORAL | Status: DC
  Administered 2024-01-13: 0.2 mg via ORAL
  Filled 2024-01-13: qty 2

## 2024-01-13 MED ORDER — IPRATROPIUM-ALBUTEROL 0.5-2.5 (3) MG/3ML IN SOLN
3.0000 mL | Freq: Four times a day (QID) | RESPIRATORY_TRACT | Status: DC
Start: 2024-01-13 — End: 2024-01-13
  Administered 2024-01-13: 3 mL via RESPIRATORY_TRACT
  Filled 2024-01-13: qty 3

## 2024-01-13 MED ORDER — ACETAMINOPHEN 500 MG PO TABS: 1000.0000 mg | ORAL_TABLET | Freq: Four times a day (QID) | ORAL | Status: DC | PRN

## 2024-01-13 MED ORDER — ONDANSETRON HCL 4 MG PO TABS: 4.0000 mg | ORAL_TABLET | Freq: Three times a day (TID) | ORAL | Status: DC | PRN

## 2024-01-13 MED ORDER — APIXABAN (ELIQUIS) VTE STARTER PACK (10MG AND 5MG)
ORAL_TABLET | ORAL | 0 refills | Status: DC
  Filled 2024-01-13: qty 74, 30d supply, fill #0

## 2024-01-13 MED ORDER — SENNA 8.6 MG PO TABS
1.0000 | ORAL_TABLET | Freq: Every day | ORAL | Status: DC
  Filled 2024-01-13: qty 1

## 2024-01-13 MED ORDER — HYDROMORPHONE HCL 2 MG PO TABS
2.0000 mg | ORAL_TABLET | ORAL | Status: DC | PRN
Start: 2024-01-13 — End: 2024-01-13

## 2024-01-13 MED ORDER — HYDROCHLOROTHIAZIDE 12.5 MG PO TABS
12.5000 mg | ORAL_TABLET | Freq: Every day | ORAL | Status: DC
  Filled 2024-01-13: qty 1

## 2024-01-13 MED ORDER — ATORVASTATIN CALCIUM 20 MG PO TABS
20.0000 mg | ORAL_TABLET | ORAL | Status: DC
  Administered 2024-01-13: 20 mg via ORAL
  Filled 2024-01-13: qty 1

## 2024-01-13 MED ORDER — DOCUSATE SODIUM 100 MG PO CAPS
100.0000 mg | ORAL_CAPSULE | Freq: Two times a day (BID) | ORAL | Status: DC
  Filled 2024-01-13: qty 1

## 2024-01-13 MED ORDER — LISINOPRIL 10 MG PO TABS
20.0000 mg | ORAL_TABLET | Freq: Every day | ORAL | Status: DC
  Administered 2024-01-13: 20 mg via ORAL
  Filled 2024-01-13: qty 2

## 2024-01-13 MED ORDER — LISINOPRIL-HYDROCHLOROTHIAZIDE 20-12.5 MG PO TABS: 1.0000 | ORAL_TABLET | Freq: Two times a day (BID) | ORAL | Status: DC

## 2024-01-13 MED ORDER — IPRATROPIUM-ALBUTEROL 0.5-2.5 (3) MG/3ML IN SOLN: 3.0000 mL | Freq: Four times a day (QID) | RESPIRATORY_TRACT | Status: DC | PRN

## 2024-01-13 MED ORDER — ASPIRIN 81 MG PO TBEC
81.0000 mg | DELAYED_RELEASE_TABLET | Freq: Every day | ORAL | Status: DC
  Administered 2024-01-13: 81 mg via ORAL
  Filled 2024-01-13: qty 1

## 2024-01-13 MED ORDER — METOPROLOL SUCCINATE ER 50 MG PO TB24
100.0000 mg | ORAL_TABLET | ORAL | Status: DC
  Administered 2024-01-13: 100 mg via ORAL
  Filled 2024-01-13 (×2): qty 2

## 2024-01-13 MED ORDER — CEPHALEXIN 500 MG PO CAPS
500.0000 mg | ORAL_CAPSULE | Freq: Three times a day (TID) | ORAL | Status: DC
  Administered 2024-01-13: 500 mg via ORAL
  Filled 2024-01-13: qty 1

## 2024-01-13 NOTE — Telephone Encounter (Signed)
 Patient Product/process development scientist completed.    The patient is insured through [employer]. Patient has [employer], may use a copay card, and/or apply for patient assistance if available.    Ran test claim for Xarelto 20 mg and the current 30 day co-pay is $49.54.   This test claim was processed through [facility]- copay amounts may vary at other pharmacies due to pharmacy/plan contracts, or as the patient moves through the different stages of their insurance plan.     Reyes Sharps, CPHT Pharmacy Technician III Certified Patient Advocate [facility] Patient Advocate Team Direct Number: [phone]  Fax: [phone]

## 2024-01-13 NOTE — Discharge Summary (Signed)
 Physician Discharge Summary   Patient: Scott [name] MRN: [id number] DOB: 09/07/1960  Admit date:     01/12/2024  Discharge date: 01/13/24  Discharge Physician: Cort ONEIDA Mana   PCP: Everlene Parris LABOR, MD   Recommendations at discharge:    PCP in 2 wks, will need new prescription for Eliquis  for continuous anticoagulation therapy  Discharge Diagnoses: Principal Problem:   Acute pulmonary embolism [facility]) Active Problems:   Pulmonary emboli [facility])   Hospital Course: 63 year old male patient with medical history significant of nonobstructive CAD, COPD, DVT off Eliquis , HTN, anxiety/depression, cervical spinal stenosis status post recent posterior decompression and fusion, presented with new onset chest pain and shortness of breath.   Patient underwent cervical spine surgery 4 days ago, since her surgery patient has been having intermittent exertional dyspnea but no cough no fever or chills.  Last night, patient started to have right-sided sharp like chest pain worsening with deep breaths.  He also has been having continuous weakness of all his limbs, but denied any urinary or bowel movement problems.  Patient was diagnosed with DVT 2 years ago and has been on Eliquis  therapy until 3 months ago, due to financial difficulties, patient stopped taking Eliquis .  At this time he denied any peripheral edema.   ED Course: Afebrile, no tachycardia blood pressure 132/80 O2 saturation 98% on room air.  CTA showed small PE, subsegmental left lower lobe, radiology.  The finding is probably not relevant to patient's symptoms.  MRI cervical spine showed post ACDF changes on C3-C4, improved moderate canal stenosis multiple other changes in C4-C5 C5-C6 and C6-C7.   Patient was evaluated by neurosurgery who recommended conservative management.  Patient was briefly on heparin  drip, switched to Eliquis  and discharged home.  Given the small size of PE, low suspicion for right heart strain and echocardiogram can  be postponed and done as outpatient.  Patient stopped taking Eliquis  3 months ago due to insurance issue.  I discussed the case with social worker and pharmacy, patient was provided with 1 months worth of Eliquis  starter kit.  Patient and his wife were educated about importance of staying on Eliquis , patient will follow-up with PCP in the next 2 weeks to secure refill of Eliquis  for ongoing therapy.      Pain control - [place]  Controlled Substance Reporting System database was reviewed. and patient was instructed, not to drive, operate heavy machinery, perform activities at heights, swimming or participation in water  activities or provide baby-sitting services while on Pain, Sleep and Anxiety Medications; until their outpatient Physician has advised to do so again. Also recommended to not to take more than prescribed Pain, Sleep and Anxiety Medications.  Consultants: Neurosurgery Procedures performed: None Disposition: Home Diet recommendation:  Cardiac diet DISCHARGE MEDICATION: Allergies as of 01/13/2024       Reactions   Acetaminophen -codeine Other (See Comments)   Nausea and hot flash   Chantix [varenicline] Nausea Only   Sour taste in mouth   Codeine Nausea Only   Gabapentin Swelling   Pregabalin  Swelling        Medication List     TAKE these medications    acetaminophen  500 MG tablet Commonly known as: TYLENOL  Take 2 tablets (1,000 mg total) by mouth every 6 (six) hours as needed.   aspirin  EC 81 MG tablet Take 81 mg by mouth daily. Swallow whole.   atorvastatin  40 MG tablet Commonly known as: LIPITOR Take 40 mg by mouth daily. What changed:  how much to take  when to take this   budesonide-formoterol  160-4.5 MCG/ACT inhaler Commonly known as: SYMBICORT Inhale 2 puffs into the lungs 2 (two) times daily.   cephALEXin  500 MG capsule Commonly known as: KEFLEX  Take 1 capsule (500 mg total) by mouth 3 (three) times daily.   cholecalciferol 25 MCG (1000  UNIT) tablet Commonly known as: VITAMIN D3 Take 1,000 Units by mouth daily.   cloNIDine  0.1 MG tablet Commonly known as: CATAPRES  Take 0.1-0.2 mg by mouth See admin instructions. 0.2 mg every morning. If bp elevated later in the day, pt will take 0.1 mg tablet if needed   cyanocobalamin  500 MCG tablet Commonly known as: VITAMIN B12 Take 500 mcg by mouth daily.   docusate sodium  100 MG capsule Commonly known as: Colace Take 1 capsule (100 mg total) by mouth 2 (two) times daily.   Eliquis  DVT/PE Starter Pack Generic drug: Apixaban  Starter Pack (10mg  and 5mg ) Take as directed on package: start with two-5mg  tablets twice daily for 7 days. On day 8, switch to one-5mg  tablet twice daily.   HYDROmorphone  2 MG tablet Commonly known as: Dilaudid  Take 1 tablet (2 mg total) by mouth every 4 (four) hours as needed for severe pain (pain score 7-10).   ipratropium-albuterol  0.5-2.5 (3) MG/3ML Soln Commonly known as: DUONEB Inhale 3 mLs into the lungs every 6 (six) hours as needed.   lisinopril -hydrochlorothiazide  20-12.5 MG tablet Commonly known as: ZESTORETIC  Take 1 tablet by mouth 2 (two) times daily.   metoprolol  succinate 100 MG 24 hr tablet Commonly known as: TOPROL -XL Take 100 mg by mouth every morning. Take with or immediately following a meal.   ondansetron  4 MG tablet Commonly known as: Zofran  Take 1 tablet (4 mg total) by mouth every 8 (eight) hours as needed for nausea or vomiting.   Potassium 99 MG Tabs Take 1 tablet by mouth daily at 6 (six) AM.   senna 8.6 MG Tabs tablet Commonly known as: SENOKOT Take 1 tablet (8.6 mg total) by mouth daily.        Discharge Exam: Filed Weights   01/13/24 1100  Weight: 70.8 kg  Eyes: PERRL, lids and conjunctivae normal ENMT: Mucous membranes are moist. Posterior pharynx clear of any exudate or lesions.Normal dentition.  Neck: normal, supple, no masses, no thyromegaly Respiratory: clear to auscultation bilaterally, no  wheezing, no crackles. Normal respiratory effort. No accessory muscle use.  Cardiovascular: Regular rate and rhythm, no murmurs / rubs / gallops. No extremity edema. 2+ pedal pulses. No carotid bruits.  Abdomen: no tenderness, no masses palpated. No hepatosplenomegaly. Bowel sounds positive.  Musculoskeletal: no clubbing / cyanosis. No joint deformity upper and lower extremities. Good ROM, no contractures. Normal muscle tone.  Skin: no rashes, lesions, ulcers. No induration Neurologic: CN 2-12 grossly intact. Sensation intact, DTR normal.  Muscle strength 5/5 on both sides Psychiatric: Normal judgment and insight. Alert and oriented x 3. Normal mood.     Condition at discharge: good  The results of significant diagnostics from this hospitalization (including imaging, microbiology, ancillary and laboratory) are listed below for reference.   Imaging Studies: US  Venous Img Lower Unilateral Left Result Date: 01/13/2024 EXAM: ULTRASOUND DUPLEX OF THE LEFT LOWER EXTREMITY VEINS TECHNIQUE: Duplex ultrasound using B-mode/gray scaled imaging and Doppler spectral analysis and color flow was obtained of the deep venous structures of the left lower extremity. COMPARISON: None. CLINICAL HISTORY: Swelling, DVT. FINDINGS: The common femoral vein, femoral vein, popliteal vein, and posterior tibial vein of the right lower extremity demonstrate normal compressibility  with normal color flow and spectral analysis. IMPRESSION: 1. No evidence of DVT. Electronically signed by: Pinkie Pebbles MD 01/13/2024 12:20 AM EDT RP Workstation: [id number]   MR Cervical Spine Wo Contrast Result Date: 01/12/2024 CLINICAL DATA:  Cervical radiculopathy, prior cervical surgery Increased weakness, cervical decompression/fusion 4 days ago. EXAM: MRI CERVICAL SPINE WITHOUT CONTRAST TECHNIQUE: Multiplanar, multisequence MR imaging of the cervical spine was performed. No intravenous contrast was administered. COMPARISON:  MRI cervical spine  11/15/2023. FINDINGS: Alignment: No substantial sagittal subluxation. Vertebrae: Interval C3-C4 ACDF. No specific evidence of acute fracture, discitis/osteomyelitis, or suspicious bone lesion. Motion limited assessment. Cord: Motion limited evaluation without convincing cord signal abnormality. Posterior Fossa, vertebral arteries, paraspinal tissues: Large volume prevertebral edema. Disc levels: C2-C3: Posterior disc osteophyte complex and bilateral facet and uncovertebral hypertrophy without substantial stenosis. C3-C4: Interval ACDF. Persistent effacement of CSF with mildly improved moderate canal stenosis. Bilateral facet uncovertebral hypertrophy with persistent severe left and moderate right foraminal stenosis. C4-C5: Posterior disc osteophyte complex with bilateral facet and uncovertebral hypertrophy. Moderate to severe right and moderate left foraminal stenosis is unchanged. Moderate canal stenosis. C5-C6: Bilateral facet uncovertebral hypertrophy with moderate to severe right and moderate left foraminal stenosis. Mild canal stenosis. C6-C7: Posterior disc osteophyte complex with left greater than right facet and uncovertebral hypertrophy. Resulting moderate to severe left foraminal stenosis and moderate right foraminal stenosis. Mild canal stenosis. C7-T1: Mild posterior disc osteophyte complex. No significant stenosis. IMPRESSION: 1. Interval C3-C4 ACDF. Persistent effacement of CSF with mildly improved moderate canal stenosis. 2. Large volume prevertebral edema, which is nonspecific and could be postsurgical in etiology but is sterility indeterminate by imaging. 3. At C4-C5, similar moderate to severe right and moderate left foraminal stenosis and moderate canal stenosis. 4. At C5-C6, similar moderate to severe right and moderate left foraminal stenosis and mild canal stenosis. 5. At C6-C7, similar moderate to severe left and moderate right foraminal stenosis and mild canal stenosis. Electronically Signed    By: Gilmore GORMAN Molt M.D.   On: 01/12/2024 22:54   CT Angio Chest PE W and/or Wo Contrast Result Date: 01/12/2024 CLINICAL DATA:  Pulmonary embolism (PE) suspected, high prob. Shortness of breath, weakness EXAM: CT ANGIOGRAPHY CHEST WITH CONTRAST TECHNIQUE: Multidetector CT imaging of the chest was performed using the standard protocol during bolus administration of intravenous contrast. Multiplanar CT image reconstructions and MIPs were obtained to evaluate the vascular anatomy. RADIATION DOSE REDUCTION: This exam was performed according to the departmental dose-optimization program which includes automated exposure control, adjustment of the mA and/or kV according to patient size and/or use of iterative reconstruction technique. CONTRAST:  OMNIPAQUE  IOHEXOL  350 MG/ML SOLN COMPARISON:  01/02/2023 FINDINGS: Cardiovascular: Heart is normal size. Aorta is normal caliber. Diffuse coronary artery and aortic atherosclerosis. Small subsegmental pulmonary embolus noted in the anterior left lower lobe on image 182 of series 6. No right heart strain. Mediastinum/Nodes: No mediastinal, hilar, or axillary adenopathy. Trachea and esophagus are unremarkable. Thyroid  unremarkable. Lungs/Pleura: Mild emphysema. Lungs are clear. No focal airspace opacities or suspicious nodules. No effusions. Upper Abdomen: No acute findings Musculoskeletal: Chest wall soft tissues are unremarkable. No acute bony abnormality. Review of the MIP images confirms the above findings. IMPRESSION: Small subsegmental left lower lobe pulmonary embolus. Given its small size, clinical relevance is questionable. Diffuse coronary artery disease. Aortic Atherosclerosis (ICD10-I70.0) and Emphysema (ICD10-J43.9). Electronically Signed   By: Franky Crease M.D.   On: 01/12/2024 21:00   DG ELBOW COMPLETE RIGHT (3+VIEW) Result Date: 01/12/2024 EXAM: 3 VIEW(S)  XRAY OF THE RIGHT ELBOW COMPARISON: None available. CLINICAL HISTORY: Pt comes via EMS from home  with sob. Pt stats he also had fall. Pt neuropathy in hands. Pt has recent procedure for hands. Pt injured rib in past week. Pt stats right elbow skin tear today. Pt has hx of copd. Pt has rhonchi and wheezing in all field per ems. 20 in right wrist; 1 duoneb given with improvements; 195/101; ST 106 with pvcs; 97% RA after duoneb; CBG 85; ETCO2 34 FINDINGS: BONES AND JOINTS: No acute fracture. No focal osseous lesion. No joint dislocation. No joint effusion. SOFT TISSUES: The soft tissues are unremarkable. IMPRESSION: 1. No acute osseous abnormality. Electronically signed by: Pinkie Pebbles MD 01/12/2024 07:21 PM EDT RP Workstation: [id number]   DG Chest 2 View Result Date: 01/12/2024 CLINICAL DATA:  Shortness of breath, weakness. EXAM: CHEST - 2 VIEW COMPARISON:  04/17/2018 FINDINGS: Mild emphysema. Heart and mediastinal contours are within normal limits. No focal opacities or effusions. No acute bony abnormality. Aortic atherosclerosis. Old healed right rib fractures. IMPRESSION: Emphysema. No acute cardiopulmonary disease. Electronically Signed   By: Franky Crease M.D.   On: 01/12/2024 19:03   DG Cervical Spine 2-3 Views Result Date: 01/08/2024 CLINICAL DATA:  Elective surgery. EXAM: CERVICAL SPINE - 2-3 VIEW COMPARISON:  Preoperative imaging FINDINGS: Two fluoroscopic spot views of the cervical spine submitted from the operating room. Surgical instruments with anterior plate and screw fixation at C3-C4. Fluoroscopy time 9 seconds. Dose 0.77 mGy. IMPRESSION: Intraoperative fluoroscopy during cervical spine surgery. Electronically Signed   By: Andrea Gasman M.D.   On: 01/08/2024 12:38   DG C-Arm 1-60 Min-No Report Result Date: 01/08/2024 Fluoroscopy was utilized by the requesting physician.  No radiographic interpretation.   DG C-Arm 1-60 Min-No Report Result Date: 01/08/2024 Fluoroscopy was utilized by the requesting physician.  No radiographic interpretation.    Microbiology: Results for  orders placed or performed during the hospital encounter of 01/01/24  Surgical pcr screen     Status: None   Collection Time: 01/01/24  1:11 PM   Specimen: Nasal Mucosa; Nasal Swab  Result Value Ref Range Status   MRSA, PCR NEGATIVE NEGATIVE Final   Staphylococcus aureus NEGATIVE NEGATIVE Final    Comment: (NOTE) The Xpert SA Assay (FDA approved for NASAL specimens in patients 30 years of age and older), is one component of a comprehensive surveillance program. It is not intended to diagnose infection nor to guide or monitor treatment. Performed at [facility], [address] [place], [place] [address]     Labs: CBC: Recent Labs  Lab 01/12/24 1827  WBC 8.5  HGB 14.6  HCT 40.8  MCV 96.7  PLT 226   Basic Metabolic Panel: Recent Labs  Lab 01/12/24 1827  NA 129*  K 3.8  CL 91*  CO2 24  GLUCOSE 93  BUN 10  CREATININE 0.52*  CALCIUM  8.9   Liver Function Tests: No results for input(s): AST, ALT, ALKPHOS, BILITOT, PROT, ALBUMIN  in the last 168 hours. CBG: No results for input(s): GLUCAP in the last 168 hours.  Discharge time spent: greater than 30 minutes.  Signed: Cort ONEIDA Mana, MD [facility] 01/13/2024

## 2024-01-13 NOTE — TOC CM/SW Note (Addendum)
..  Transition of Care [facility]) - Inpatient Brief Assessment   Patient Details  Name: Scott [name] MRN: [id number] Date of Birth: [birth year]  Transition of Care [facility]) CM/SW Contact:    Edsel DELENA Fischer, LCSW Phone Number: 01/13/2024, 10:35 AM   Clinical Narrative:  TOC reached out to hospital pharmacy to see if they can possibly assist pt with medication needs. Reply from pharmacy We can get him a free one month supply of Eliquis  using the manufacturer voucher. After the first month supply, his copay is around $49 per month using his insurance. We can apply the Eliquis  $10 manufacturer card to make his copay $10 per month. He will need to activate the card for us .  MD will follow up with pt regarding pharmacy response.  Transition of Care Asessment:

## 2024-01-13 NOTE — H&P (Signed)
 History and Physical    Scott [name] FMW:[id number] DOB: 26-Dec-1960 DOA: 01/12/2024  PCP: Everlene Parris LABOR, MD (Confirm with patient/family/NH records and if not entered, this has to be entered at [facility] point of entry) Patient coming from: Home  I have personally briefly reviewed patient's old medical records in [facility]  Chief Complaint: Chest pain, SOB  HPI: Scott [name] is a 63 y.o. male with medical history significant of nonobstructive CAD, COPD, DVT off Eliquis , HTN, anxiety/depression, cervical spinal stenosis status post recent posterior decompression and fusion, presented with new onset chest pain and shortness of breath.  Patient underwent cervical spine surgery on September 18, since her surgery patient has been having intermittent exertional dyspnea but no cough no fever or chills.  Last night, patient started to have right-sided sharp like chest pain worsening with deep breaths.  He also has been having continuous weakness of all his limbs, but denied any urinary or bowel movement problems.  Patient was diagnosed with DVT 2 years ago and has been on Eliquis  therapy until 3 months ago, due to financial difficulties, patient stopped taking Eliquis .  At this time he denied any peripheral edema.  ED Course: Afebrile, no tachycardia blood pressure 132/80 O2 saturation 98% on room air.  CTA showed small PE, subsegmental left lower lobe, radiology.  The finding is probably not relevant to patient's symptoms.  MRI cervical spine showed post ACDF changes on C3-C4, improved moderate canal stenosis multiple other changes in C4-C5 C5-C6 and C6-C7.  Patient was evaluated by neurosurgery who recommended conservative management.  Review of Systems: As per HPI otherwise 14 point review of systems negative.    Past Medical History:  Diagnosis Date   AAA (abdominal aortic aneurysm) without rupture 02/23/2020   a.) AAA duplex 02/23/2020: 3.8 cm; b.) AAA duplex 11/28/2020: 3.7 cm; c.)  CTA AO + BIFEM 12/18/2020: 3.8 x 3.4 cm; d.) AAA duplex 11/07/2021: 3.7 cm; e.) CTA AO + BIFEM 11/16/2021: 4.2 cm; f.) AAA duplex 05/28/2022: 4.0 cm; g.) AAA duplex 11/26/2022: 4.4 cm; h.) PET CT 10/01/2022: 4.3 cm; i.) AAA duplex 08/06/2023: 4.8 cm; j.) CTA abd/pel 11/20/2023: 4.8 x 4.6 cm   Alcoholism (HCC)    Anginal pain    Anxiety    Aortic atherosclerosis    Aortic root dilatation    Arthritis    Bilateral carotid artery disease 06/20/2020   a.) carotid doppler 06/20/2020: 1-39% RICA, 40-59% LICA; b.) carotid doppler 11/28/2020, 11/26/2022: 1-39% BICA   Bilateral inguinal hernia    CAD (coronary artery disease) 06/03/2017   a.) LHC 06/03/2017: 25% pLAD - med mgmt; b.) LHC 08/22/2021: 25% pLAD, 15% o-mLM, 25% dLM-pLAD, 25% o-mLCx, 25% oD2 - med mgmt   Carpal tunnel syndrome    Cigarette smoker    COPD (chronic obstructive pulmonary disease) (HCC)    COVID-19 2022   Deep vein thrombosis (DVT) of left lower extremity (HCC)    Depression    Diastolic dysfunction    Diverticulosis    Dupuytren contracture of both hands    Dyspnea    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    a.) small area of chronic encephalomalacia in the LEFT inferior temporal gyrus   GERD (gastroesophageal reflux disease)    Hepatic steatosis    HLD (hyperlipidemia)    Hypertension    Incomplete left bundle branch block (LBBB)    Lung nodules    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   a.) PET CT 10/01/2022: 6 mm with SUV  max of 6.4; b.) s.p FNA 11/05/2022 --> pathology negative for malignancy (DDx oncocytosis vs oncocytoma)   PAOD (peripheral arterial occlusive disease)    a.) s/p BILATERAL femoral endarterectomies 12/29/2020; b.) s/p aorto-bifemoral bypass   Pre-diabetes    Stenosis of cervical spine with myelopathy (HCC)    SVT (supraventricular tachycardia)    a.) s/p ablation 02/28/2016   Vitamin B12 deficiency    Vitamin D deficiency     Past Surgical History:  Procedure Laterality Date    ANKLE SURGERY     bracelet   ANTERIOR CERVICAL DECOMP/DISCECTOMY FUSION N/A 01/08/2024   Procedure: ANTERIOR CERVICAL DECOMPRESSION/DISCECTOMY FUSION 1 LEVEL;  Surgeon: Claudene Penne ORN, MD;  Location: [facility] ORS;  Service: Neurosurgery;  Laterality: N/A;  C3-4 ANTERIOR CERVICAL DISCECTOMY AND FUSION   AORTO-FEMORAL BYPASS GRAFT     CHOLECYSTECTOMY  2000   COLONOSCOPY     ENDARTERECTOMY FEMORAL Bilateral 12/29/2020   Procedure: ENDARTERECTOMY FEMORAL;  Surgeon: Marea Selinda RAMAN, MD;  Location: [facility] ORS;  Service: Vascular;  Laterality: Bilateral;  Left SFA and Tibial intervention   INSERTION OF MESH Bilateral 02/12/2023   Procedure: INSERTION OF MESH;  Surgeon: Rodolph Romano, MD;  Location: [facility] ORS;  Service: General;  Laterality: Bilateral;   LEFT HEART CATH AND CORONARY ANGIOGRAPHY Left 06/03/2017   Procedure: LEFT HEART CATH AND CORONARY ANGIOGRAPHY;  Surgeon: Florencio Cara BIRCH, MD;  Location: [facility];  Service: Cardiovascular;  Laterality: Left;   LEFT HEART CATH AND CORONARY ANGIOGRAPHY N/A 08/22/2021   Procedure: LEFT HEART CATH AND CORONARY ANGIOGRAPHY;  Surgeon: Florencio Cara BIRCH, MD;  Location: [facility];  Service: Cardiovascular;  Laterality: N/A;   LOWER EXTREMITY ANGIOGRAPHY Left 02/03/2020   Procedure: LOWER EXTREMITY ANGIOGRAPHY;  Surgeon: Marea Selinda RAMAN, MD;  Location: [facility];  Service: Cardiovascular;  Laterality: Left;   SVT ABLATION N/A 02/28/2016   TONSILLECTOMY       reports that he has been smoking cigarettes. He has never used smokeless tobacco. He reports current alcohol  use. He reports that he does not use drugs.  Allergies  Allergen Reactions   Acetaminophen -Codeine Other (See Comments)    Nausea and hot flash    Chantix [Varenicline] Nausea Only    Sour taste in mouth   Codeine Nausea Only   Gabapentin Swelling   Pregabalin  Swelling    Family History  Problem Relation Age of Onset   Diabetes Mother     Hypertension Mother    Lupus Mother    Heart attack Father    Cancer Brother    Cancer Brother      Prior to Admission medications   Medication Sig Start Date End Date Taking? Authorizing Provider  acetaminophen  (TYLENOL ) 500 MG tablet Take 2 tablets (1,000 mg total) by mouth every 6 (six) hours as needed. 01/08/24  Yes Ulis Bottcher, PA-C  aspirin  EC 81 MG tablet Take 81 mg by mouth daily. Swallow whole.   Yes [provider]  atorvastatin  (LIPITOR) 40 MG tablet Take 40 mg by mouth daily. Patient taking differently: Take 20 mg by mouth every morning. 12/07/20  Yes [provider]  budesonide-formoterol  (SYMBICORT) 160-4.5 MCG/ACT inhaler Inhale 2 puffs into the lungs 2 (two) times daily. 11/07/22  Yes [provider]  cephALEXin  (KEFLEX ) 500 MG capsule Take 1 capsule (500 mg total) by mouth 3 (three) times daily. 01/08/24  Yes Ulis Bottcher, PA-C  cholecalciferol (VITAMIN D3) 25 MCG (1000 UNIT) tablet Take 1,000 Units by mouth daily.  Yes [provider]  cloNIDine  (CATAPRES ) 0.1 MG tablet Take 0.1-0.2 mg by mouth See admin instructions. 0.2 mg every morning. If bp elevated later in the day, pt will take 0.1 mg tablet if needed   Yes [provider]  docusate sodium  (COLACE) 100 MG capsule Take 1 capsule (100 mg total) by mouth 2 (two) times daily. 01/08/24  Yes Ulis Bottcher, PA-C  HYDROmorphone  (DILAUDID ) 2 MG tablet Take 1 tablet (2 mg total) by mouth every 4 (four) hours as needed for severe pain (pain score 7-10). 01/08/24  Yes Ulis Bottcher, PA-C  lisinopril -hydrochlorothiazide  (ZESTORETIC ) 20-12.5 MG tablet Take 1 tablet by mouth 2 (two) times daily.   Yes [provider]  metoprolol  succinate (TOPROL -XL) 100 MG 24 hr tablet Take 100 mg by mouth every morning. Take with or immediately following a meal.   Yes [provider]  ondansetron  (ZOFRAN ) 4 MG tablet Take 1 tablet (4 mg total) by mouth every 8 (eight) hours as  needed for nausea or vomiting. 01/08/24  Yes Ulis Bottcher, PA-C  Potassium 99 MG TABS Take 1 tablet by mouth daily at 6 (six) AM.   Yes [provider]  senna (SENOKOT) 8.6 MG TABS tablet Take 1 tablet (8.6 mg total) by mouth daily. 01/08/24  Yes Ulis Bottcher, PA-C  vitamin B-12 (CYANOCOBALAMIN ) 500 MCG tablet Take 500 mcg by mouth daily.   Yes [provider]  ipratropium-albuterol  (DUONEB) 0.5-2.5 (3) MG/3ML SOLN Inhale 3 mLs into the lungs every 6 (six) hours as needed. 11/20/22 01/01/24  [provider]    Physical Exam: Vitals:   01/13/24 0530 01/13/24 0554 01/13/24 0600 01/13/24 1000  BP: (!) 179/122  (!) 155/104   Pulse: (!) 102  96   Resp: 16     Temp:  98.5 F (36.9 C)  98 F (36.7 C)  TempSrc:  Oral  Oral  SpO2: 92%  93%     Constitutional: NAD, calm, comfortable Vitals:   01/13/24 0530 01/13/24 0554 01/13/24 0600 01/13/24 1000  BP: (!) 179/122  (!) 155/104   Pulse: (!) 102  96   Resp: 16     Temp:  98.5 F (36.9 C)  98 F (36.7 C)  TempSrc:  Oral  Oral  SpO2: 92%  93%    Eyes: PERRL, lids and conjunctivae normal ENMT: Mucous membranes are moist. Posterior pharynx clear of any exudate or lesions.Normal dentition.  Neck: normal, supple, no masses, no thyromegaly Respiratory: clear to auscultation bilaterally, no wheezing, no crackles. Normal respiratory effort. No accessory muscle use.  Cardiovascular: Regular rate and rhythm, no murmurs / rubs / gallops. No extremity edema. 2+ pedal pulses. No carotid bruits.  Abdomen: no tenderness, no masses palpated. No hepatosplenomegaly. Bowel sounds positive.  Musculoskeletal: no clubbing / cyanosis. No joint deformity upper and lower extremities. Good ROM, no contractures. Normal muscle tone.  Skin: no rashes, lesions, ulcers. No induration Neurologic: CN 2-12 grossly intact. Sensation intact, DTR normal. Strength 5/5 in all 4.  Psychiatric: Normal judgment and insight. Alert and oriented x 3.  Normal mood.    Labs on Admission: I have personally reviewed following labs and imaging studies  CBC: Recent Labs  Lab 01/12/24 1827  WBC 8.5  HGB 14.6  HCT 40.8  MCV 96.7  PLT 226   Basic Metabolic Panel: Recent Labs  Lab 01/12/24 1827  NA 129*  K 3.8  CL 91*  CO2 24  GLUCOSE 93  BUN 10  CREATININE 0.52*  CALCIUM  8.9  GFR: Estimated Creatinine Clearance: 94.6 mL/min (A) (by C-G formula based on SCr of 0.52 mg/dL (L)). Liver Function Tests: No results for input(s): AST, ALT, ALKPHOS, BILITOT, PROT, ALBUMIN  in the last 168 hours. No results for input(s): LIPASE, AMYLASE in the last 168 hours. No results for input(s): AMMONIA in the last 168 hours. Coagulation Profile: Recent Labs  Lab 01/13/24 0022  INR 1.1   Cardiac Enzymes: No results for input(s): CKTOTAL, CKMB, CKMBINDEX, TROPONINI in the last 168 hours. BNP (last 3 results) No results for input(s): PROBNP in the last 8760 hours. HbA1C: No results for input(s): HGBA1C in the last 72 hours. CBG: No results for input(s): GLUCAP in the last 168 hours. Lipid Profile: No results for input(s): CHOL, HDL, LDLCALC, TRIG, CHOLHDL, LDLDIRECT in the last 72 hours. Thyroid  Function Tests: No results for input(s): TSH, T4TOTAL, FREET4, T3FREE, THYROIDAB in the last 72 hours. Anemia Panel: No results for input(s): VITAMINB12, FOLATE, FERRITIN, TIBC, IRON, RETICCTPCT in the last 72 hours. Urine analysis:    Component Value Date/Time   COLORURINE YELLOW 10/31/2023 1304   APPEARANCEUR CLEAR 10/31/2023 1304   LABSPEC 1.005 10/31/2023 1304   PHURINE 7.0 10/31/2023 1304   GLUCOSEU NEGATIVE 10/31/2023 1304   HGBUR NEGATIVE 10/31/2023 1304   KETONESUR NEGATIVE 10/31/2023 1304   PROTEINUR NEGATIVE 10/31/2023 1304   NITRITE NEGATIVE 10/31/2023 1304   LEUKOCYTESUR NEGATIVE 10/31/2023 1304    Radiological Exams on Admission: US  Venous Img Lower  Unilateral Left Result Date: 01/13/2024 EXAM: ULTRASOUND DUPLEX OF THE LEFT LOWER EXTREMITY VEINS TECHNIQUE: Duplex ultrasound using B-mode/gray scaled imaging and Doppler spectral analysis and color flow was obtained of the deep venous structures of the left lower extremity. COMPARISON: None. CLINICAL HISTORY: Swelling, DVT. FINDINGS: The common femoral vein, femoral vein, popliteal vein, and posterior tibial vein of the right lower extremity demonstrate normal compressibility with normal color flow and spectral analysis. IMPRESSION: 1. No evidence of DVT. Electronically signed by: Pinkie Pebbles MD 01/13/2024 12:20 AM EDT RP Workstation: [id number]   MR Cervical Spine Wo Contrast Result Date: 01/12/2024 CLINICAL DATA:  Cervical radiculopathy, prior cervical surgery Increased weakness, cervical decompression/fusion 4 days ago. EXAM: MRI CERVICAL SPINE WITHOUT CONTRAST TECHNIQUE: Multiplanar, multisequence MR imaging of the cervical spine was performed. No intravenous contrast was administered. COMPARISON:  MRI cervical spine 11/15/2023. FINDINGS: Alignment: No substantial sagittal subluxation. Vertebrae: Interval C3-C4 ACDF. No specific evidence of acute fracture, discitis/osteomyelitis, or suspicious bone lesion. Motion limited assessment. Cord: Motion limited evaluation without convincing cord signal abnormality. Posterior Fossa, vertebral arteries, paraspinal tissues: Large volume prevertebral edema. Disc levels: C2-C3: Posterior disc osteophyte complex and bilateral facet and uncovertebral hypertrophy without substantial stenosis. C3-C4: Interval ACDF. Persistent effacement of CSF with mildly improved moderate canal stenosis. Bilateral facet uncovertebral hypertrophy with persistent severe left and moderate right foraminal stenosis. C4-C5: Posterior disc osteophyte complex with bilateral facet and uncovertebral hypertrophy. Moderate to severe right and moderate left foraminal stenosis is unchanged.  Moderate canal stenosis. C5-C6: Bilateral facet uncovertebral hypertrophy with moderate to severe right and moderate left foraminal stenosis. Mild canal stenosis. C6-C7: Posterior disc osteophyte complex with left greater than right facet and uncovertebral hypertrophy. Resulting moderate to severe left foraminal stenosis and moderate right foraminal stenosis. Mild canal stenosis. C7-T1: Mild posterior disc osteophyte complex. No significant stenosis. IMPRESSION: 1. Interval C3-C4 ACDF. Persistent effacement of CSF with mildly improved moderate canal stenosis. 2. Large volume prevertebral edema, which is nonspecific and could be postsurgical in etiology but is sterility indeterminate by imaging. 3.  At C4-C5, similar moderate to severe right and moderate left foraminal stenosis and moderate canal stenosis. 4. At C5-C6, similar moderate to severe right and moderate left foraminal stenosis and mild canal stenosis. 5. At C6-C7, similar moderate to severe left and moderate right foraminal stenosis and mild canal stenosis. Electronically Signed   By: Gilmore GORMAN Molt M.D.   On: 01/12/2024 22:54   CT Angio Chest PE W and/or Wo Contrast Result Date: 01/12/2024 CLINICAL DATA:  Pulmonary embolism (PE) suspected, high prob. Shortness of breath, weakness EXAM: CT ANGIOGRAPHY CHEST WITH CONTRAST TECHNIQUE: Multidetector CT imaging of the chest was performed using the standard protocol during bolus administration of intravenous contrast. Multiplanar CT image reconstructions and MIPs were obtained to evaluate the vascular anatomy. RADIATION DOSE REDUCTION: This exam was performed according to the departmental dose-optimization program which includes automated exposure control, adjustment of the mA and/or kV according to patient size and/or use of iterative reconstruction technique. CONTRAST:  OMNIPAQUE  IOHEXOL  350 MG/ML SOLN COMPARISON:  01/02/2023 FINDINGS: Cardiovascular: Heart is normal size. Aorta is normal caliber.  Diffuse coronary artery and aortic atherosclerosis. Small subsegmental pulmonary embolus noted in the anterior left lower lobe on image 182 of series 6. No right heart strain. Mediastinum/Nodes: No mediastinal, hilar, or axillary adenopathy. Trachea and esophagus are unremarkable. Thyroid  unremarkable. Lungs/Pleura: Mild emphysema. Lungs are clear. No focal airspace opacities or suspicious nodules. No effusions. Upper Abdomen: No acute findings Musculoskeletal: Chest wall soft tissues are unremarkable. No acute bony abnormality. Review of the MIP images confirms the above findings. IMPRESSION: Small subsegmental left lower lobe pulmonary embolus. Given its small size, clinical relevance is questionable. Diffuse coronary artery disease. Aortic Atherosclerosis (ICD10-I70.0) and Emphysema (ICD10-J43.9). Electronically Signed   By: Franky Crease M.D.   On: 01/12/2024 21:00   DG ELBOW COMPLETE RIGHT (3+VIEW) Result Date: 01/12/2024 EXAM: 3 VIEW(S) XRAY OF THE RIGHT ELBOW COMPARISON: None available. CLINICAL HISTORY: Pt comes via EMS from home with sob. Pt stats he also had fall. Pt neuropathy in hands. Pt has recent procedure for hands. Pt injured rib in past week. Pt stats right elbow skin tear today. Pt has hx of copd. Pt has rhonchi and wheezing in all field per ems. 20 in right wrist; 1 duoneb given with improvements; 195/101; ST 106 with pvcs; 97% RA after duoneb; CBG 85; ETCO2 34 FINDINGS: BONES AND JOINTS: No acute fracture. No focal osseous lesion. No joint dislocation. No joint effusion. SOFT TISSUES: The soft tissues are unremarkable. IMPRESSION: 1. No acute osseous abnormality. Electronically signed by: Pinkie Pebbles MD 01/12/2024 07:21 PM EDT RP Workstation: [id number]   DG Chest 2 View Result Date: 01/12/2024 CLINICAL DATA:  Shortness of breath, weakness. EXAM: CHEST - 2 VIEW COMPARISON:  04/17/2018 FINDINGS: Mild emphysema. Heart and mediastinal contours are within normal limits. No focal  opacities or effusions. No acute bony abnormality. Aortic atherosclerosis. Old healed right rib fractures. IMPRESSION: Emphysema. No acute cardiopulmonary disease. Electronically Signed   By: Franky Crease M.D.   On: 01/12/2024 19:03    EKG: Independently reviewed.  Sinus tachycardia, no acute ST changes.  Assessment/Plan Principal Problem:   Acute pulmonary embolism (HCC) Active Problems:   Pulmonary emboli (HCC)  (please populate well all problems here in Problem List. (For example, if patient is on BP meds at home and you resume or decide to hold them, it is a problem that needs to be her. Same for CAD, COPD, HLD and so on)  Chest pain LLL subsegmental PE History of  DVT noncoherent with Eliquis  - Continue heparin  drip - Given the history of noncoherent with Eliquis  due to insurance issue, consult case management for medication help.  Given the history of recurrent thromboembolic event, likely will need lifelong anticoagulation.  Outpatient follow-up hematology for hypercoagulable state workup. - DVT study negative - Check echocardiogram - Other DDx, review of patient history from that patient had a LHC 2 years ago which showed nonobstructive CAD.  Symptoms less likely angina.  Recommend outpatient follow-up with cardiology.  Severe spinal stenosis status post ACDF - As shown on today's cervical spine MRI, there was a significant improvement of stenosis.  Patient was seen by neurosurgery in the ED, who recommended continue outpatient follow-up.  HTN, uncontrolled - Resume home BP medication including clonidine , HCTZ/lisinopril  and metoprolol   COPD - Stable, continue as needed albuterol  - Continue ICS and LABA   Total time spent on patient care 55 minutes  DVT prophylaxis: Heparin  drip Code Status: Full code Family Communication: None at bedside Disposition Plan: Expect more than 2 midnight hospital stay Consults called: None Admission status: Telemetry admission   Cort ONEIDA Mana MD [facility] Pager [phone]  01/13/2024, 10:03 AM

## 2024-01-13 NOTE — Progress Notes (Signed)
 PHARMACY - ANTICOAGULATION CONSULT NOTE  Pharmacy Consult for Heparin   Indication: pulmonary embolus  Allergies  Allergen Reactions   Acetaminophen -Codeine Other (See Comments)    Nausea and hot flash    Chantix [Varenicline] Nausea Only    Sour taste in mouth   Codeine Nausea Only   Gabapentin Swelling   Pregabalin  Swelling    Patient Measurements: Height: 5' 10.98 (180.3 cm) Weight: 70.8 kg (156 lb 1.4 oz) IBW/kg (Calculated) : 75.26 HEPARIN  DW (KG): 70.8  Vital Signs: Temp: 98 F (36.7 C) (09/23 1000) Temp Source: Oral (09/23 1000) BP: 155/104 (09/23 0600) Pulse Rate: 96 (09/23 0600)  Labs: Recent Labs    01/12/24 1827 01/13/24 0022 01/13/24 0412 01/13/24 1001  HGB 14.6  --   --   --   HCT 40.8  --   --   --   PLT 226  --   --   --   APTT  --  22*  --   --   LABPROT  --  14.7  --   --   INR  --  1.1  --   --   HEPARINUNFRC  --  <0.10* 0.63 0.22*  CREATININE 0.52*  --   --   --     Estimated Creatinine Clearance: 94.6 mL/min (A) (by C-G formula based on SCr of 0.52 mg/dL (L)).   Medical History: Past Medical History:  Diagnosis Date   AAA (abdominal aortic aneurysm) without rupture 02/23/2020   a.) AAA duplex 02/23/2020: 3.8 cm; b.) AAA duplex 11/28/2020: 3.7 cm; c.) CTA AO + BIFEM 12/18/2020: 3.8 x 3.4 cm; d.) AAA duplex 11/07/2021: 3.7 cm; e.) CTA AO + BIFEM 11/16/2021: 4.2 cm; f.) AAA duplex 05/28/2022: 4.0 cm; g.) AAA duplex 11/26/2022: 4.4 cm; h.) PET CT 10/01/2022: 4.3 cm; i.) AAA duplex 08/06/2023: 4.8 cm; j.) CTA abd/pel 11/20/2023: 4.8 x 4.6 cm   Alcoholism (HCC)    Anginal pain    Anxiety    Aortic atherosclerosis    Aortic root dilatation    Arthritis    Bilateral carotid artery disease 06/20/2020   a.) carotid doppler 06/20/2020: 1-39% RICA, 40-59% LICA; b.) carotid doppler 11/28/2020, 11/26/2022: 1-39% BICA   Bilateral inguinal hernia    CAD (coronary artery disease) 06/03/2017   a.) LHC 06/03/2017: 25% pLAD - med mgmt; b.) LHC  08/22/2021: 25% pLAD, 15% o-mLM, 25% dLM-pLAD, 25% o-mLCx, 25% oD2 - med mgmt   Carpal tunnel syndrome    Cigarette smoker    COPD (chronic obstructive pulmonary disease) (HCC)    COVID-19 2022   Deep vein thrombosis (DVT) of left lower extremity (HCC)    Depression    Diastolic dysfunction    Diverticulosis    Dupuytren contracture of both hands    Dyspnea    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    a.) small area of chronic encephalomalacia in the LEFT inferior temporal gyrus   GERD (gastroesophageal reflux disease)    Hepatic steatosis    HLD (hyperlipidemia)    Hypertension    Incomplete left bundle branch block (LBBB)    Lung nodules    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   a.) PET CT 10/01/2022: 6 mm with SUV max of 6.4; b.) s.p FNA 11/05/2022 --> pathology negative for malignancy (DDx oncocytosis vs oncocytoma)   PAOD (peripheral arterial occlusive disease)    a.) s/p BILATERAL femoral endarterectomies 12/29/2020; b.) s/p aorto-bifemoral bypass   Pre-diabetes    Stenosis of cervical spine with myelopathy (HCC)  SVT (supraventricular tachycardia)    a.) s/p ablation 02/28/2016   Vitamin B12 deficiency    Vitamin D deficiency     Medications:  (Not in a hospital admission)   Assessment: Pharmacy consulted to dose heparin  in this 63 year old male admitted with PE.  No prior anticoag noted. CrCl = 94.6 ml/min  9/23: HL @ 0412 = 0.63, therapeutic X 1 9/23: HL @ 1001 = 0.22, subtherapeutic   Goal of Therapy:  Heparin  level 0.3-0.7 units/ml Monitor platelets by anticoagulation protocol: Yes   Plan:  9/23: HL @ 1001 = 0.22, subtherapeutic  - give 1000 unit bolus x 1 - increase heparin  infusion rate to 1300 units/hr - check heparin  level in 6 hours - CBC daily  - follow for appropriate transition to oral anticoagulation  Lum VEAR Mania, PharmD, BCPS 01/13/2024,11:26 AM

## 2024-01-13 NOTE — Progress Notes (Signed)
 PHARMACY - ANTICOAGULATION CONSULT NOTE  Pharmacy Consult for Heparin   Indication: pulmonary embolus  Allergies  Allergen Reactions   Acetaminophen -Codeine Other (See Comments)    Nausea and hot flash    Chantix [Varenicline] Nausea Only    Sour taste in mouth   Codeine Nausea Only   Gabapentin Swelling   Pregabalin  Swelling    Patient Measurements:    Vital Signs: Temp: 98.5 F (36.9 C) (09/23 0554) Temp Source: Oral (09/23 0554) BP: 179/122 (09/23 0530) Pulse Rate: 102 (09/23 0530)  Labs: Recent Labs    01/12/24 1827 01/13/24 0022 01/13/24 0412  HGB 14.6  --   --   HCT 40.8  --   --   PLT 226  --   --   APTT  --  22*  --   LABPROT  --  14.7  --   INR  --  1.1  --   HEPARINUNFRC  --  <0.10* 0.63  CREATININE 0.52*  --   --     Estimated Creatinine Clearance: 94.6 mL/min (A) (by C-G formula based on SCr of 0.52 mg/dL (L)).   Medical History: Past Medical History:  Diagnosis Date   AAA (abdominal aortic aneurysm) without rupture 02/23/2020   a.) AAA duplex 02/23/2020: 3.8 cm; b.) AAA duplex 11/28/2020: 3.7 cm; c.) CTA AO + BIFEM 12/18/2020: 3.8 x 3.4 cm; d.) AAA duplex 11/07/2021: 3.7 cm; e.) CTA AO + BIFEM 11/16/2021: 4.2 cm; f.) AAA duplex 05/28/2022: 4.0 cm; g.) AAA duplex 11/26/2022: 4.4 cm; h.) PET CT 10/01/2022: 4.3 cm; i.) AAA duplex 08/06/2023: 4.8 cm; j.) CTA abd/pel 11/20/2023: 4.8 x 4.6 cm   Alcoholism (HCC)    Anginal pain    Anxiety    Aortic atherosclerosis    Aortic root dilatation    Arthritis    Bilateral carotid artery disease 06/20/2020   a.) carotid doppler 06/20/2020: 1-39% RICA, 40-59% LICA; b.) carotid doppler 11/28/2020, 11/26/2022: 1-39% BICA   Bilateral inguinal hernia    CAD (coronary artery disease) 06/03/2017   a.) LHC 06/03/2017: 25% pLAD - med mgmt; b.) LHC 08/22/2021: 25% pLAD, 15% o-mLM, 25% dLM-pLAD, 25% o-mLCx, 25% oD2 - med mgmt   Carpal tunnel syndrome    Cigarette smoker    COPD (chronic obstructive pulmonary  disease) (HCC)    COVID-19 2022   Deep vein thrombosis (DVT) of left lower extremity (HCC)    Depression    Diastolic dysfunction    Diverticulosis    Dupuytren contracture of both hands    Dyspnea    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    a.) small area of chronic encephalomalacia in the LEFT inferior temporal gyrus   GERD (gastroesophageal reflux disease)    Hepatic steatosis    HLD (hyperlipidemia)    Hypertension    Incomplete left bundle branch block (LBBB)    Lung nodules    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   a.) PET CT 10/01/2022: 6 mm with SUV max of 6.4; b.) s.p FNA 11/05/2022 --> pathology negative for malignancy (DDx oncocytosis vs oncocytoma)   PAOD (peripheral arterial occlusive disease)    a.) s/p BILATERAL femoral endarterectomies 12/29/2020; b.) s/p aorto-bifemoral bypass   Pre-diabetes    Stenosis of cervical spine with myelopathy (HCC)    SVT (supraventricular tachycardia)    a.) s/p ablation 02/28/2016   Vitamin B12 deficiency    Vitamin D deficiency     Medications:  (Not in a hospital admission)   Assessment: Pharmacy consulted to dose  heparin  in this 63 year old male admitted with PE.  No prior anticoag noted. CrCl = 94.6 ml/min  Goal of Therapy:  Heparin  level 0.3-0.7 units/ml Monitor platelets by anticoagulation protocol: Yes   Plan:  9/23: HL @ 0412 = 0.63, therapeutic X 1 - will continue pt on current rate and recheck HL in 6 hrs. - CBC daily   Scott [name] D 01/13/2024,6:38 AM

## 2024-01-13 NOTE — Consult Note (Signed)
 Consulting Department:  Emergency department  Primary Physician:  Everlene Parris LABOR, MD  Chief Complaint: Recent fall after ACDF  History of Present Illness: 01/13/2024 Scott [name] is a 63 y.o. male who presents with the chief complaint of recent fall.  He has a history of severe cervical stenosis and single level ACDF for his most critical location.  Understandably he did have continued stenosis.  However he was unable to quit smoking so we did not perform a full posterior decompression and fusion given high risk of pseudoarthrosis.  He was recovering well at home and had a fall striking his head he stated that he fell to immediate numbness in his lower extremity so came into the emergency department.  He states that now he has had a recovery and he feels back at his baseline.  He has not had any new deficits.  Nothing is longstanding.  Not having any new neck pain.  He would like to go home if he could.   Review of Systems:  A 10 point review of systems is negative, except for the pertinent positives and negatives detailed in the HPI.  Past Medical History: Past Medical History:  Diagnosis Date   AAA (abdominal aortic aneurysm) without rupture 02/23/2020   a.) AAA duplex 02/23/2020: 3.8 cm; b.) AAA duplex 11/28/2020: 3.7 cm; c.) CTA AO + BIFEM 12/18/2020: 3.8 x 3.4 cm; d.) AAA duplex 11/07/2021: 3.7 cm; e.) CTA AO + BIFEM 11/16/2021: 4.2 cm; f.) AAA duplex 05/28/2022: 4.0 cm; g.) AAA duplex 11/26/2022: 4.4 cm; h.) PET CT 10/01/2022: 4.3 cm; i.) AAA duplex 08/06/2023: 4.8 cm; j.) CTA abd/pel 11/20/2023: 4.8 x 4.6 cm   Alcoholism (HCC)    Anginal pain    Anxiety    Aortic atherosclerosis    Aortic root dilatation    Arthritis    Bilateral carotid artery disease 06/20/2020   a.) carotid doppler 06/20/2020: 1-39% RICA, 40-59% LICA; b.) carotid doppler 11/28/2020, 11/26/2022: 1-39% BICA   Bilateral inguinal hernia    CAD (coronary artery disease) 06/03/2017   a.) LHC 06/03/2017:  25% pLAD - med mgmt; b.) LHC 08/22/2021: 25% pLAD, 15% o-mLM, 25% dLM-pLAD, 25% o-mLCx, 25% oD2 - med mgmt   Carpal tunnel syndrome    Cigarette smoker    COPD (chronic obstructive pulmonary disease) (HCC)    COVID-19 2022   Deep vein thrombosis (DVT) of left lower extremity (HCC)    Depression    Diastolic dysfunction    Diverticulosis    Dupuytren contracture of both hands    Dyspnea    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    a.) small area of chronic encephalomalacia in the LEFT inferior temporal gyrus   GERD (gastroesophageal reflux disease)    Hepatic steatosis    HLD (hyperlipidemia)    Hypertension    Incomplete left bundle branch block (LBBB)    Lung nodules    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   a.) PET CT 10/01/2022: 6 mm with SUV max of 6.4; b.) s.p FNA 11/05/2022 --> pathology negative for malignancy (DDx oncocytosis vs oncocytoma)   PAOD (peripheral arterial occlusive disease)    a.) s/p BILATERAL femoral endarterectomies 12/29/2020; b.) s/p aorto-bifemoral bypass   Pre-diabetes    Stenosis of cervical spine with myelopathy (HCC)    SVT (supraventricular tachycardia)    a.) s/p ablation 02/28/2016   Vitamin B12 deficiency    Vitamin D deficiency     Past Surgical History: Past Surgical History:  Procedure Laterality Date  ANKLE SURGERY     bracelet   ANTERIOR CERVICAL DECOMP/DISCECTOMY FUSION N/A 01/08/2024   Procedure: ANTERIOR CERVICAL DECOMPRESSION/DISCECTOMY FUSION 1 LEVEL;  Surgeon: Claudene Penne ORN, MD;  Location: [facility];  Service: Neurosurgery;  Laterality: N/A;  C3-4 ANTERIOR CERVICAL DISCECTOMY AND FUSION   AORTO-FEMORAL BYPASS GRAFT     CHOLECYSTECTOMY  2000   COLONOSCOPY     ENDARTERECTOMY FEMORAL Bilateral 12/29/2020   Procedure: ENDARTERECTOMY FEMORAL;  Surgeon: Marea Selinda RAMAN, MD;  Location: [facility];  Service: Vascular;  Laterality: Bilateral;  Left SFA and Tibial intervention   INSERTION OF MESH Bilateral 02/12/2023   Procedure:  INSERTION OF MESH;  Surgeon: Rodolph Romano, MD;  Location: [facility];  Service: General;  Laterality: Bilateral;   LEFT HEART CATH AND CORONARY ANGIOGRAPHY Left 06/03/2017   Procedure: LEFT HEART CATH AND CORONARY ANGIOGRAPHY;  Surgeon: Florencio Cara BIRCH, MD;  Location: [facility];  Service: Cardiovascular;  Laterality: Left;   LEFT HEART CATH AND CORONARY ANGIOGRAPHY N/A 08/22/2021   Procedure: LEFT HEART CATH AND CORONARY ANGIOGRAPHY;  Surgeon: Florencio Cara BIRCH, MD;  Location: [facility];  Service: Cardiovascular;  Laterality: N/A;   LOWER EXTREMITY ANGIOGRAPHY Left 02/03/2020   Procedure: LOWER EXTREMITY ANGIOGRAPHY;  Surgeon: Marea Selinda RAMAN, MD;  Location: [facility];  Service: Cardiovascular;  Laterality: Left;   SVT ABLATION N/A 02/28/2016   TONSILLECTOMY      Allergies: Allergies as of 01/12/2024 - Reviewed 01/12/2024  Allergen Reaction Noted   Acetaminophen -codeine Other (See Comments) 08/09/2013   Chantix [varenicline] Nausea Only 02/20/2016   Codeine Nausea Only 12/07/2014   Gabapentin Swelling 11/01/2022   Pregabalin  Swelling 11/01/2022    Medications:  Current Facility-Administered Medications:    heparin  ADULT infusion 100 units/mL (25000 units/250mL), 1,150 Units/hr, Intravenous, Continuous, Paduchowski, Franky, MD, Last Rate: 11.5 mL/hr at 01/13/24 0023, 1,150 Units/hr at 01/13/24 0023   ipratropium-albuterol  (DUONEB) 0.5-2.5 (3) MG/3ML nebulizer solution 3 mL, 3 mL, Nebulization, QID, Mansy, Jan A, MD, 3 mL at 01/13/24 9372  Current Outpatient Medications:    acetaminophen  (TYLENOL ) 500 MG tablet, Take 2 tablets (1,000 mg total) by mouth every 6 (six) hours as needed., Disp: 30 tablet, Rfl: 0   aspirin  EC 81 MG tablet, Take 81 mg by mouth daily. Swallow whole., Disp: , Rfl:    atorvastatin  (LIPITOR) 40 MG tablet, Take 40 mg by mouth daily. (Patient taking differently: Take 20 mg by mouth every morning.), Disp: , Rfl:     budesonide-formoterol  (SYMBICORT) 160-4.5 MCG/ACT inhaler, Inhale 2 puffs into the lungs 2 (two) times daily., Disp: , Rfl:    cephALEXin  (KEFLEX ) 500 MG capsule, Take 1 capsule (500 mg total) by mouth 3 (three) times daily., Disp: 9 capsule, Rfl: 0   cholecalciferol (VITAMIN D3) 25 MCG (1000 UNIT) tablet, Take 1,000 Units by mouth daily., Disp: , Rfl:    cloNIDine  (CATAPRES ) 0.1 MG tablet, Take 0.1-0.2 mg by mouth See admin instructions. 0.2 mg every morning. If bp elevated later in the day, pt will take 0.1 mg tablet if needed, Disp: , Rfl:    docusate sodium  (COLACE) 100 MG capsule, Take 1 capsule (100 mg total) by mouth 2 (two) times daily., Disp: 10 capsule, Rfl: 0   HYDROmorphone  (DILAUDID ) 2 MG tablet, Take 1 tablet (2 mg total) by mouth every 4 (four) hours as needed for severe pain (pain score 7-10)., Disp: 30 tablet, Rfl: 0   lisinopril -hydrochlorothiazide  (ZESTORETIC ) 20-12.5 MG tablet, Take 1 tablet by mouth 2 (two)  times daily., Disp: , Rfl:    metoprolol  succinate (TOPROL -XL) 100 MG 24 hr tablet, Take 100 mg by mouth every morning. Take with or immediately following a meal., Disp: , Rfl:    ondansetron  (ZOFRAN ) 4 MG tablet, Take 1 tablet (4 mg total) by mouth every 8 (eight) hours as needed for nausea or vomiting., Disp: 20 tablet, Rfl: 0   Potassium 99 MG TABS, Take 1 tablet by mouth daily at 6 (six) AM., Disp: , Rfl:    senna (SENOKOT) 8.6 MG TABS tablet, Take 1 tablet (8.6 mg total) by mouth daily., Disp: 120 tablet, Rfl: 0   vitamin B-12 (CYANOCOBALAMIN ) 500 MCG tablet, Take 500 mcg by mouth daily., Disp: , Rfl:    ipratropium-albuterol  (DUONEB) 0.5-2.5 (3) MG/3ML SOLN, Inhale 3 mLs into the lungs every 6 (six) hours as needed., Disp: , Rfl:    Social History: Social History   Tobacco Use   Smoking status: Every Day    Current packs/day: 0.75    Types: Cigarettes   Smokeless tobacco: Never  Vaping Use   Vaping status: Never Used  Substance Use Topics   Alcohol  use: Yes     Comment: 6-8 beers per day   Drug use: No    Family Medical History: Family History  Problem Relation Age of Onset   Diabetes Mother    Hypertension Mother    Lupus Mother    Heart attack Father    Cancer Brother    Cancer Brother     Physical Examination: Vitals:   01/13/24 0530 01/13/24 0554  BP: (!) 179/122   Pulse: (!) 102   Resp: 16   Temp:  98.5 F (36.9 C)  SpO2: 92%      General: Patient is well developed, well nourished, calm, collected, and in no apparent distress.  NEUROLOGICAL:  General: In no acute distress.   Awake, alert, oriented to person, place, and time.  Pupils equal round and reactive to light.  Facial tone is symmetric.  Tongue protrusion is midline.  There is no pronator drift.  Neck is soft and nontender.  No evidence of subcutaneous fluid collection.  Trachea is midline.   Strength: Side Biceps Triceps Deltoid Interossei Grip Wrist Ext. Wrist Flex.  R 4 4 4  1-2 2 4 4   L 4 4 4  1-2 4 - 4 4   At least 4 to 4+ out of 5 in the bilateral lower extremities proximally and distally.  Imaging: US  Venous Img Lower Unilateral Left Result Date: 01/13/2024 EXAM: ULTRASOUND DUPLEX OF THE LEFT LOWER EXTREMITY VEINS TECHNIQUE: Duplex ultrasound using B-mode/gray scaled imaging and Doppler spectral analysis and color flow was obtained of the deep venous structures of the left lower extremity. COMPARISON: None. CLINICAL HISTORY: Swelling, DVT. FINDINGS: The common femoral vein, femoral vein, popliteal vein, and posterior tibial vein of the right lower extremity demonstrate normal compressibility with normal color flow and spectral analysis. IMPRESSION: 1. No evidence of DVT. Electronically signed by: Pinkie Pebbles MD 01/13/2024 12:20 AM EDT RP Workstation: [id number]   MR Cervical Spine Wo Contrast Result Date: 01/12/2024 CLINICAL DATA:  Cervical radiculopathy, prior cervical surgery Increased weakness, cervical decompression/fusion 4 days ago. EXAM: MRI  CERVICAL SPINE WITHOUT CONTRAST TECHNIQUE: Multiplanar, multisequence MR imaging of the cervical spine was performed. No intravenous contrast was administered. COMPARISON:  MRI cervical spine 11/15/2023. FINDINGS: Alignment: No substantial sagittal subluxation. Vertebrae: Interval C3-C4 ACDF. No specific evidence of acute fracture, discitis/osteomyelitis, or suspicious bone lesion. Motion limited assessment. Cord: Motion  limited evaluation without convincing cord signal abnormality. Posterior Fossa, vertebral arteries, paraspinal tissues: Large volume prevertebral edema. Disc levels: C2-C3: Posterior disc osteophyte complex and bilateral facet and uncovertebral hypertrophy without substantial stenosis. C3-C4: Interval ACDF. Persistent effacement of CSF with mildly improved moderate canal stenosis. Bilateral facet uncovertebral hypertrophy with persistent severe left and moderate right foraminal stenosis. C4-C5: Posterior disc osteophyte complex with bilateral facet and uncovertebral hypertrophy. Moderate to severe right and moderate left foraminal stenosis is unchanged. Moderate canal stenosis. C5-C6: Bilateral facet uncovertebral hypertrophy with moderate to severe right and moderate left foraminal stenosis. Mild canal stenosis. C6-C7: Posterior disc osteophyte complex with left greater than right facet and uncovertebral hypertrophy. Resulting moderate to severe left foraminal stenosis and moderate right foraminal stenosis. Mild canal stenosis. C7-T1: Mild posterior disc osteophyte complex. No significant stenosis. IMPRESSION: 1. Interval C3-C4 ACDF. Persistent effacement of CSF with mildly improved moderate canal stenosis. 2. Large volume prevertebral edema, which is nonspecific and could be postsurgical in etiology but is sterility indeterminate by imaging. 3. At C4-C5, similar moderate to severe right and moderate left foraminal stenosis and moderate canal stenosis. 4. At C5-C6, similar moderate to severe right  and moderate left foraminal stenosis and mild canal stenosis. 5. At C6-C7, similar moderate to severe left and moderate right foraminal stenosis and mild canal stenosis. Electronically Signed   By: Gilmore GORMAN Molt M.D.   On: 01/12/2024 22:54   CT Angio Chest PE W and/or Wo Contrast Result Date: 01/12/2024 CLINICAL DATA:  Pulmonary embolism (PE) suspected, high prob. Shortness of breath, weakness EXAM: CT ANGIOGRAPHY CHEST WITH CONTRAST TECHNIQUE: Multidetector CT imaging of the chest was performed using the standard protocol during bolus administration of intravenous contrast. Multiplanar CT image reconstructions and MIPs were obtained to evaluate the vascular anatomy. RADIATION DOSE REDUCTION: This exam was performed according to the departmental dose-optimization program which includes automated exposure control, adjustment of the mA and/or kV according to patient size and/or use of iterative reconstruction technique. CONTRAST:  OMNIPAQUE  IOHEXOL  350 MG/ML SOLN COMPARISON:  01/02/2023 FINDINGS: Cardiovascular: Heart is normal size. Aorta is normal caliber. Diffuse coronary artery and aortic atherosclerosis. Small subsegmental pulmonary embolus noted in the anterior left lower lobe on image 182 of series 6. No right heart strain. Mediastinum/Nodes: No mediastinal, hilar, or axillary adenopathy. Trachea and esophagus are unremarkable. Thyroid  unremarkable. Lungs/Pleura: Mild emphysema. Lungs are clear. No focal airspace opacities or suspicious nodules. No effusions. Upper Abdomen: No acute findings Musculoskeletal: Chest wall soft tissues are unremarkable. No acute bony abnormality. Review of the MIP images confirms the above findings. IMPRESSION: Small subsegmental left lower lobe pulmonary embolus. Given its small size, clinical relevance is questionable. Diffuse coronary artery disease. Aortic Atherosclerosis (ICD10-I70.0) and Emphysema (ICD10-J43.9). Electronically Signed   By: Franky Crease M.D.   On:  01/12/2024 21:00   DG ELBOW COMPLETE RIGHT (3+VIEW) Result Date: 01/12/2024 EXAM: 3 VIEW(S) XRAY OF THE RIGHT ELBOW COMPARISON: None available. CLINICAL HISTORY: Pt comes via EMS from home with sob. Pt stats he also had fall. Pt neuropathy in hands. Pt has recent procedure for hands. Pt injured rib in past week. Pt stats right elbow skin tear today. Pt has hx of copd. Pt has rhonchi and wheezing in all field per ems. 20 in right wrist; 1 duoneb given with improvements; 195/101; ST 106 with pvcs; 97% RA after duoneb; CBG 85; ETCO2 34 FINDINGS: BONES AND JOINTS: No acute fracture. No focal osseous lesion. No joint dislocation. No joint effusion. SOFT TISSUES: The soft  tissues are unremarkable. IMPRESSION: 1. No acute osseous abnormality. Electronically signed by: Pinkie Pebbles MD 01/12/2024 07:21 PM EDT RP Workstation: [id number]   DG Chest 2 View Result Date: 01/12/2024 CLINICAL DATA:  Shortness of breath, weakness. EXAM: CHEST - 2 VIEW COMPARISON:  04/17/2018 FINDINGS: Mild emphysema. Heart and mediastinal contours are within normal limits. No focal opacities or effusions. No acute bony abnormality. Aortic atherosclerosis. Old healed right rib fractures. IMPRESSION: Emphysema. No acute cardiopulmonary disease. Electronically Signed   By: Franky Crease M.D.   On: 01/12/2024 19:03     I have personally reviewed the images and agree with the above interpretation.  Labs:    Latest Ref Rng & Units 01/12/2024    6:27 PM 10/30/2023   11:48 AM 02/12/2023   10:10 AM  CBC  WBC 4.0 - 10.5 K/uL 8.5  5.8    Hemoglobin 13.0 - 17.0 g/dL 85.3  84.9  84.9   Hematocrit 39.0 - 52.0 % 40.8  43.3  44.0   Platelets 150 - 400 K/uL 226  159        Latest Ref Rng & Units 01/12/2024    6:27 PM 01/01/2024    1:11 PM 02/12/2023   10:10 AM  BMP  Glucose 70 - 99 mg/dL 93  89  875   BUN 8 - 23 mg/dL 10  6  7    Creatinine 0.61 - 1.24 mg/dL 9.47  9.54  9.29   Sodium 135 - 145 mmol/L 129  132  129   Potassium 3.5 -  5.1 mmol/L 3.8  3.4  4.0   Chloride 98 - 111 mmol/L 91  92  90   CO2 22 - 32 mmol/L 24  25    Calcium  8.9 - 10.3 mg/dL 8.9  9.3      INR  1.1 (09/23 0022)   Assessment and Plan: Mr. Harbaugh is a pleasant 63 y.o. male with recent ACDF for single level of critical stenosis.  He had continued known stenosis.  Had a fall and struck his head and developed significant numbness and tingling in his lower extremities.  Came into the emergency department for evaluation.  Overnight he states that he has returned to his baseline.  On physical examination he is at baseline and a matter fact of his right upper extremity especially in his hands have shown improvement.  He has full strength in his lower extremities with no major deficits.  His imaging shows expected changes.  At this point we will continue to follow him outpatient.  He does not need any acute intervention.  He would like to go home if possible  Penne MICAEL Sharps, MD/MSCR Dept. of Neurosurgery

## 2024-01-14 ENCOUNTER — Telehealth: Payer: Self-pay | Admitting: Neurosurgery

## 2024-01-14 ENCOUNTER — Telehealth (INDEPENDENT_AMBULATORY_CARE_PROVIDER_SITE_OTHER): Payer: Self-pay | Admitting: Physician Assistant

## 2024-01-14 ENCOUNTER — Telehealth: Payer: Self-pay

## 2024-01-14 DIAGNOSIS — Z09 Encounter for follow-up examination after completed treatment for conditions other than malignant neoplasm: Secondary | ICD-10-CM

## 2024-01-14 DIAGNOSIS — M4802 Spinal stenosis, cervical region: Secondary | ICD-10-CM

## 2024-01-14 MED ORDER — TIZANIDINE HCL 4 MG PO TABS: 2.0000 mg | ORAL_TABLET | Freq: Three times a day (TID) | ORAL | 1 refills | Status: DC | PRN

## 2024-01-14 MED ORDER — PREGABALIN 50 MG PO CAPS: 50.0000 mg | ORAL_CAPSULE | Freq: Two times a day (BID) | ORAL | 1 refills | Status: DC

## 2024-01-14 NOTE — Telephone Encounter (Signed)
 Brooke spoke with the patient.

## 2024-01-14 NOTE — Telephone Encounter (Signed)
 Authorization for Lyrica  has been sent to plan on Cover My meds and is pending.  PA Case ID #: [id number] Key # [id number]

## 2024-01-14 NOTE — Telephone Encounter (Signed)
 Talk to patient over the phone.  He is postop day 6 status post C3-4 ACDF.  He has significant decrease in neurologic function and weakness at baseline.  He is having significant, extreme pain that he is complaining of over the phone.  Primary neurologic in nature as well as muscle spasms.  He states that previously he stopped taking gabapentin and Lyrica  because it was thought that it could be contributing to swelling, but he feels as though he needs something to help with his neuropathic pain.  I told him I would be willing to trial this again, but if he were to have any symptoms to please let us  know.  I told him I was very concerned about his fall risk on gabapentin and a muscle relaxer secondary to his alcohol  use and baseline myelopathy.  I explained to the patient that taking a muscle relaxer and neuropathic pain medication would increase his drowsiness and increase his fall risk in a way that would make me concerned for further harm.  Patient and his sister were counseled on reducing fall risk as much as possible in terms of when he is taking the medication and decreasing his alcohol  use.  They expressed understanding regarding the risk of these medications and would like to continue due to severe pain.  We discussed patient going to the emergency department, but he refuses at this time.

## 2024-01-14 NOTE — Telephone Encounter (Signed)
 Patient is calling to let our office know that he fell on Monday and was taken to the ER. He states that Dr. Claudene saw him in the ER and that everything looked fine. He states that he is unable to walk and is unable to pick up a washcloth or a towel. He also states that he is having jerking in his right arm and both legs. Please advise.

## 2024-01-15 ENCOUNTER — Other Ambulatory Visit: Payer: Self-pay

## 2024-01-15 ENCOUNTER — Ambulatory Visit: Payer: Self-pay

## 2024-01-15 DIAGNOSIS — R531 Weakness: Secondary | ICD-10-CM | POA: Diagnosis not present

## 2024-01-15 DIAGNOSIS — Z91148 Patient's other noncompliance with medication regimen for other reason: Secondary | ICD-10-CM | POA: Insufficient documentation

## 2024-01-15 DIAGNOSIS — Z79899 Other long term (current) drug therapy: Secondary | ICD-10-CM | POA: Insufficient documentation

## 2024-01-15 DIAGNOSIS — S40022A Contusion of left upper arm, initial encounter: Secondary | ICD-10-CM | POA: Diagnosis not present

## 2024-01-15 DIAGNOSIS — M545 Low back pain, unspecified: Secondary | ICD-10-CM | POA: Diagnosis not present

## 2024-01-15 DIAGNOSIS — M5002 Cervical disc disorder with myelopathy, mid-cervical region, unspecified level: Secondary | ICD-10-CM | POA: Insufficient documentation

## 2024-01-15 DIAGNOSIS — E7800 Pure hypercholesterolemia, unspecified: Secondary | ICD-10-CM | POA: Diagnosis not present

## 2024-01-15 DIAGNOSIS — E876 Hypokalemia: Secondary | ICD-10-CM | POA: Diagnosis not present

## 2024-01-15 DIAGNOSIS — E871 Hypo-osmolality and hyponatremia: Secondary | ICD-10-CM | POA: Diagnosis not present

## 2024-01-15 DIAGNOSIS — I739 Peripheral vascular disease, unspecified: Secondary | ICD-10-CM | POA: Diagnosis not present

## 2024-01-15 DIAGNOSIS — F17210 Nicotine dependence, cigarettes, uncomplicated: Secondary | ICD-10-CM | POA: Diagnosis not present

## 2024-01-15 DIAGNOSIS — Z8616 Personal history of COVID-19: Secondary | ICD-10-CM | POA: Diagnosis not present

## 2024-01-15 DIAGNOSIS — M549 Dorsalgia, unspecified: Secondary | ICD-10-CM | POA: Insufficient documentation

## 2024-01-15 DIAGNOSIS — W19XXXA Unspecified fall, initial encounter: Secondary | ICD-10-CM | POA: Insufficient documentation

## 2024-01-15 DIAGNOSIS — Z7901 Long term (current) use of anticoagulants: Secondary | ICD-10-CM | POA: Diagnosis not present

## 2024-01-15 DIAGNOSIS — I10 Essential (primary) hypertension: Secondary | ICD-10-CM | POA: Diagnosis not present

## 2024-01-15 DIAGNOSIS — Z981 Arthrodesis status: Secondary | ICD-10-CM | POA: Insufficient documentation

## 2024-01-15 DIAGNOSIS — R449 Unspecified symptoms and signs involving general sensations and perceptions: Secondary | ICD-10-CM | POA: Diagnosis not present

## 2024-01-15 DIAGNOSIS — M79672 Pain in left foot: Secondary | ICD-10-CM | POA: Insufficient documentation

## 2024-01-15 DIAGNOSIS — R201 Hypoesthesia of skin: Secondary | ICD-10-CM | POA: Diagnosis not present

## 2024-01-15 DIAGNOSIS — M25522 Pain in left elbow: Secondary | ICD-10-CM | POA: Diagnosis not present

## 2024-01-15 DIAGNOSIS — F1010 Alcohol abuse, uncomplicated: Secondary | ICD-10-CM | POA: Insufficient documentation

## 2024-01-15 DIAGNOSIS — M25512 Pain in left shoulder: Secondary | ICD-10-CM | POA: Diagnosis not present

## 2024-01-15 DIAGNOSIS — I2510 Atherosclerotic heart disease of native coronary artery without angina pectoris: Secondary | ICD-10-CM | POA: Insufficient documentation

## 2024-01-15 DIAGNOSIS — Z7982 Long term (current) use of aspirin: Secondary | ICD-10-CM | POA: Insufficient documentation

## 2024-01-15 DIAGNOSIS — Z86718 Personal history of other venous thrombosis and embolism: Secondary | ICD-10-CM | POA: Diagnosis not present

## 2024-01-15 DIAGNOSIS — J441 Chronic obstructive pulmonary disease with (acute) exacerbation: Secondary | ICD-10-CM | POA: Insufficient documentation

## 2024-01-15 DIAGNOSIS — M19012 Primary osteoarthritis, left shoulder: Secondary | ICD-10-CM | POA: Diagnosis not present

## 2024-01-15 DIAGNOSIS — R0602 Shortness of breath: Secondary | ICD-10-CM | POA: Diagnosis not present

## 2024-01-15 DIAGNOSIS — R208 Other disturbances of skin sensation: Secondary | ICD-10-CM | POA: Diagnosis not present

## 2024-01-15 LAB — CBC
HCT: 38.7 % — ABNORMAL LOW (ref 39.0–52.0)
Hemoglobin: 14.3 g/dL (ref 13.0–17.0)
MCH: 35 pg — ABNORMAL HIGH (ref 26.0–34.0)
MCHC: 37 g/dL — ABNORMAL HIGH (ref 30.0–36.0)
MCV: 94.6 fL (ref 80.0–100.0)
Platelets: 242 K/uL (ref 150–400)
RBC: 4.09 MIL/uL — ABNORMAL LOW (ref 4.22–5.81)
RDW: 12 % (ref 11.5–15.5)
WBC: 8.5 K/uL (ref 4.0–10.5)
nRBC: 0 % (ref 0.0–0.2)

## 2024-01-15 NOTE — Telephone Encounter (Signed)
 Spoke with [facility] pharmacist, they can see that PA was denied by the insurance but with [employer] it would be $18 and some change if patient would like to get this filled. I spoke to patient's sister-ok per DPR on file, and patient and advised them of this and they do want to proceed with using [employer] coupon, I called [facility] pharmacy and was sent to their voicemail, I left detailed message letting them know to please fill Lyrica  prescription and use [employer] coupon.

## 2024-01-15 NOTE — ED Triage Notes (Signed)
 Pt arrives via ems c/o worsening weakness in BLE and BUE. Pt had recent hospital admission for PE, ems noted wheezing and gave 2.5 mg of duonebs en route. Pt c/o of pain BLE, BUE, back, neck and left foot. See notes from 9/22 hospitalization. Denis sob.   Pt has BLE swelling and unable to palpate pedal pulses.

## 2024-01-15 NOTE — Telephone Encounter (Signed)
 Attempted to reach patient. No voicemail. We started PA on cover my meds and this morning it was closed. I am calling to see if he has picked up the medication.

## 2024-01-15 NOTE — Telephone Encounter (Signed)
  FYI Only or Action Required?: Action required by provider: refused ER.  Patient was last seen in primary care on 12/29/2023 by Zafirov, Clarissa A, MD.  Called Nurse Triage reporting Fall.  Symptoms began several days ago.  Interventions attempted: Rest, hydration, or home remedies.  Symptoms are: rapidly worsening.  Triage Disposition: Call EMS 911 Now  Patient/caregiver understands and will follow disposition?: Yes  Copied from CRM #[id number]. Topic: Clinical - Red Word Triage >> Jan 15, 2024 12:53 PM Harlene ORN wrote: Red Word that prompted transfer to Nurse Triage:  Friend of the patient (DPR-approved) is calling asking for help taking care of the patient. Patient fell yesterday and was taken care of by the paramedics, but fell again this morning. Reason for Disposition  [1] SEVERE weakness (e.g., unable to walk or barely able to walk, requires support) AND [2] new-onset or getting worse  Answer Assessment - Initial Assessment Questions Additional info: 1) Caregiver Karna calling to report that Scott [name] fell last night and again today due to extreme weakness. Spinal surgery on 01/08/24 with progressively worsening weakness. Fall and ER on 01/12/24, he was told he would receive home nursing services 3 times per week. They have not heard from anyone to set this up. Alyse Karna is saying she needs help with his care as it is becoming a lot and wearing on her physically. She is having difficulty helping him off the floor when he falls. Advised ED for falls, she states he is refusing he is hard headed   Please follow up with Friend caregiver Karna [phone]   1. DESCRIPTION: Describe how you are feeling.     Weak  2. SEVERITY: How bad is it?  Can you stand and walk?     Severe, fall last night and this morning 3. ONSET: When did these symptoms begin? (e.g., hours, days, weeks, months)     Several days 4. CAUSE: What do you think is causing the weakness or fatigue? (e.g.,  not drinking enough fluids, medical problem, trouble sleeping)     unsure 5. NEW MEDICINES:  Have you started on any new medicines recently? (e.g., opioid pain medicines, benzodiazepines, muscle relaxants, antidepressants, antihistamines, neuroleptics, beta blockers)      6. OTHER SYMPTOMS: Do you have any other symptoms? (e.g., chest pain, fever, cough, SOB, vomiting, diarrhea, bleeding, other areas of pain)     Weakness  Protocols used: Weakness (Generalized) and Fatigue-A-AH

## 2024-01-16 ENCOUNTER — Emergency Department

## 2024-01-16 ENCOUNTER — Other Ambulatory Visit: Payer: Self-pay

## 2024-01-16 ENCOUNTER — Observation Stay

## 2024-01-16 ENCOUNTER — Observation Stay
Admission: EM | Admit: 2024-01-16 | Discharge: 2024-01-23 | Disposition: A | Discharge status: Skilled Nursing Facility | Attending: Internal Medicine | Admitting: Internal Medicine

## 2024-01-16 ENCOUNTER — Encounter: Payer: Self-pay | Admitting: Internal Medicine

## 2024-01-16 DIAGNOSIS — R0602 Shortness of breath: Secondary | ICD-10-CM | POA: Diagnosis not present

## 2024-01-16 DIAGNOSIS — Z91148 Patient's other noncompliance with medication regimen for other reason: Secondary | ICD-10-CM

## 2024-01-16 DIAGNOSIS — I2510 Atherosclerotic heart disease of native coronary artery without angina pectoris: Secondary | ICD-10-CM | POA: Diagnosis present

## 2024-01-16 DIAGNOSIS — M549 Dorsalgia, unspecified: Secondary | ICD-10-CM

## 2024-01-16 DIAGNOSIS — I2699 Other pulmonary embolism without acute cor pulmonale: Secondary | ICD-10-CM | POA: Diagnosis present

## 2024-01-16 DIAGNOSIS — R208 Other disturbances of skin sensation: Secondary | ICD-10-CM

## 2024-01-16 DIAGNOSIS — M25522 Pain in left elbow: Secondary | ICD-10-CM | POA: Diagnosis not present

## 2024-01-16 DIAGNOSIS — R449 Unspecified symptoms and signs involving general sensations and perceptions: Secondary | ICD-10-CM

## 2024-01-16 DIAGNOSIS — G8929 Other chronic pain: Secondary | ICD-10-CM | POA: Diagnosis present

## 2024-01-16 DIAGNOSIS — J441 Chronic obstructive pulmonary disease with (acute) exacerbation: Secondary | ICD-10-CM

## 2024-01-16 DIAGNOSIS — M47812 Spondylosis without myelopathy or radiculopathy, cervical region: Secondary | ICD-10-CM | POA: Diagnosis not present

## 2024-01-16 DIAGNOSIS — M25512 Pain in left shoulder: Secondary | ICD-10-CM | POA: Diagnosis not present

## 2024-01-16 DIAGNOSIS — R531 Weakness: Secondary | ICD-10-CM | POA: Diagnosis not present

## 2024-01-16 DIAGNOSIS — M5126 Other intervertebral disc displacement, lumbar region: Secondary | ICD-10-CM | POA: Diagnosis not present

## 2024-01-16 DIAGNOSIS — M47814 Spondylosis without myelopathy or radiculopathy, thoracic region: Secondary | ICD-10-CM | POA: Diagnosis not present

## 2024-01-16 DIAGNOSIS — W19XXXA Unspecified fall, initial encounter: Secondary | ICD-10-CM

## 2024-01-16 DIAGNOSIS — M47816 Spondylosis without myelopathy or radiculopathy, lumbar region: Secondary | ICD-10-CM | POA: Diagnosis not present

## 2024-01-16 DIAGNOSIS — E7800 Pure hypercholesterolemia, unspecified: Secondary | ICD-10-CM | POA: Diagnosis present

## 2024-01-16 DIAGNOSIS — J449 Chronic obstructive pulmonary disease, unspecified: Secondary | ICD-10-CM | POA: Diagnosis present

## 2024-01-16 DIAGNOSIS — F1010 Alcohol abuse, uncomplicated: Secondary | ICD-10-CM | POA: Diagnosis present

## 2024-01-16 DIAGNOSIS — S40022A Contusion of left upper arm, initial encounter: Secondary | ICD-10-CM | POA: Diagnosis present

## 2024-01-16 LAB — RESP PANEL BY RT-PCR (RSV, FLU A&B, COVID)  RVPGX2
Influenza A by PCR: NEGATIVE
Influenza B by PCR: NEGATIVE
Resp Syncytial Virus by PCR: NEGATIVE
SARS Coronavirus 2 by RT PCR: NEGATIVE

## 2024-01-16 LAB — COMPREHENSIVE METABOLIC PANEL WITH GFR
ALT: 24 U/L (ref 0–44)
AST: 36 U/L (ref 15–41)
Albumin: 3.5 g/dL (ref 3.5–5.0)
Alkaline Phosphatase: 69 U/L (ref 38–126)
Anion gap: 16 — ABNORMAL HIGH (ref 5–15)
BUN: 9 mg/dL (ref 8–23)
CO2: 25 mmol/L (ref 22–32)
Calcium: 8.8 mg/dL — ABNORMAL LOW (ref 8.9–10.3)
Chloride: 86 mmol/L — ABNORMAL LOW (ref 98–111)
Creatinine, Ser: 0.56 mg/dL — ABNORMAL LOW (ref 0.61–1.24)
GFR, Estimated: >60 mL/min (ref >60)
Glucose, Bld: 90 mg/dL (ref 70–99)
Potassium: 3.3 mmol/L — ABNORMAL LOW (ref 3.5–5.1)
Sodium: 127 mmol/L — ABNORMAL LOW (ref 135–145)
Total Bilirubin: 0.7 mg/dL (ref 0.0–1.2)
Total Protein: 6.8 g/dL (ref 6.5–8.1)

## 2024-01-16 LAB — BRAIN NATRIURETIC PEPTIDE: B Natriuretic Peptide: 52 pg/mL (ref 0.0–100.0)

## 2024-01-16 LAB — URINALYSIS, ROUTINE W REFLEX MICROSCOPIC
Bilirubin Urine: NEGATIVE
Glucose, UA: NEGATIVE mg/dL
Hgb urine dipstick: NEGATIVE
Ketones, ur: 5 mg/dL — AB
Leukocytes,Ua: NEGATIVE
Nitrite: NEGATIVE
Protein, ur: NEGATIVE mg/dL
Specific Gravity, Urine: 1.004 — ABNORMAL LOW (ref 1.005–1.030)
pH: 6 (ref 5.0–8.0)

## 2024-01-16 LAB — TROPONIN I (HIGH SENSITIVITY): Troponin I (High Sensitivity): 9 ng/L (ref <18)

## 2024-01-16 MED ORDER — TIZANIDINE HCL 2 MG PO TABS
2.0000 mg | ORAL_TABLET | Freq: Three times a day (TID) | ORAL | Status: DC | PRN
  Administered 2024-01-21 – 2024-01-23 (×5): 4 mg via ORAL
  Filled 2024-01-16 (×6): qty 2

## 2024-01-16 MED ORDER — METOPROLOL SUCCINATE ER 50 MG PO TB24
100.0000 mg | ORAL_TABLET | Freq: Every day | ORAL | Status: DC
  Administered 2024-01-16 – 2024-01-23 (×7): 100 mg via ORAL
  Filled 2024-01-16 (×8): qty 2

## 2024-01-16 MED ORDER — METHYLPREDNISOLONE SODIUM SUCC 125 MG IJ SOLR
125.0000 mg | Freq: Once | INTRAMUSCULAR | Status: AC
  Administered 2024-01-16: 125 mg via INTRAVENOUS
  Filled 2024-01-16: qty 2

## 2024-01-16 MED ORDER — HYDROMORPHONE HCL 1 MG/ML IJ SOLN
0.5000 mg | Freq: Once | INTRAMUSCULAR | Status: AC
  Administered 2024-01-16: 0.5 mg via INTRAVENOUS
  Filled 2024-01-16: qty 0.5

## 2024-01-16 MED ORDER — LISINOPRIL 20 MG PO TABS
20.0000 mg | ORAL_TABLET | Freq: Every day | ORAL | Status: DC
  Administered 2024-01-16 – 2024-01-23 (×7): 20 mg via ORAL
  Filled 2024-01-16 (×8): qty 1

## 2024-01-16 MED ORDER — POTASSIUM CHLORIDE 10 MEQ/100ML IV SOLN
10.0000 meq | INTRAVENOUS | Status: AC
  Administered 2024-01-16 (×3): 10 meq via INTRAVENOUS
  Filled 2024-01-16: qty 100

## 2024-01-16 MED ORDER — IPRATROPIUM-ALBUTEROL 0.5-2.5 (3) MG/3ML IN SOLN
3.0000 mL | RESPIRATORY_TRACT | Status: DC | PRN
  Administered 2024-01-19 – 2024-01-22 (×4): 3 mL via RESPIRATORY_TRACT
  Filled 2024-01-16 (×4): qty 3

## 2024-01-16 MED ORDER — ONDANSETRON HCL 4 MG/2ML IJ SOLN: 4.0000 mg | Freq: Three times a day (TID) | INTRAMUSCULAR | Status: AC | PRN

## 2024-01-16 MED ORDER — IPRATROPIUM-ALBUTEROL 0.5-2.5 (3) MG/3ML IN SOLN
9.0000 mL | Freq: Once | RESPIRATORY_TRACT | Status: AC
Start: 2024-01-16 — End: 2024-01-16
  Administered 2024-01-16: 9 mL via RESPIRATORY_TRACT
  Filled 2024-01-16: qty 9

## 2024-01-16 MED ORDER — HYDROMORPHONE HCL 1 MG/ML IJ SOLN: 0.5000 mg | INTRAMUSCULAR | Status: AC | PRN

## 2024-01-16 MED ORDER — NICOTINE POLACRILEX 4 MG MT LOZG: 4.0000 mg | LOZENGE | OROMUCOSAL | Status: DC | PRN

## 2024-01-16 MED ORDER — APIXABAN 5 MG PO TABS
5.0000 mg | ORAL_TABLET | Freq: Two times a day (BID) | ORAL | Status: DC
  Administered 2024-01-16 – 2024-01-23 (×15): 5 mg via ORAL
  Filled 2024-01-16 (×15): qty 1

## 2024-01-16 MED ORDER — CLONIDINE HCL 0.1 MG PO TABS
0.1000 mg | ORAL_TABLET | Freq: Three times a day (TID) | ORAL | Status: DC
  Administered 2024-01-16: 0.1 mg via ORAL
  Filled 2024-01-16: qty 1

## 2024-01-16 MED ORDER — LISINOPRIL-HYDROCHLOROTHIAZIDE 20-12.5 MG PO TABS: 1.0000 | ORAL_TABLET | Freq: Two times a day (BID) | ORAL | Status: DC

## 2024-01-16 MED ORDER — HYDROCHLOROTHIAZIDE 12.5 MG PO TABS
12.5000 mg | ORAL_TABLET | Freq: Every day | ORAL | Status: DC
  Administered 2024-01-16 – 2024-01-18 (×3): 12.5 mg via ORAL
  Filled 2024-01-16 (×3): qty 1

## 2024-01-16 MED ORDER — ASPIRIN 81 MG PO TBEC
81.0000 mg | DELAYED_RELEASE_TABLET | Freq: Every day | ORAL | Status: DC
Start: 2024-01-16 — End: 2024-01-17
  Administered 2024-01-16 – 2024-01-17 (×2): 81 mg via ORAL
  Filled 2024-01-16 (×2): qty 1

## 2024-01-16 MED ORDER — ATORVASTATIN CALCIUM 20 MG PO TABS
40.0000 mg | ORAL_TABLET | Freq: Every day | ORAL | Status: DC
  Administered 2024-01-16 – 2024-01-23 (×8): 40 mg via ORAL
  Filled 2024-01-16 (×8): qty 2

## 2024-01-16 MED ORDER — PREGABALIN 50 MG PO CAPS
50.0000 mg | ORAL_CAPSULE | Freq: Two times a day (BID) | ORAL | Status: DC
  Administered 2024-01-16 – 2024-01-23 (×15): 50 mg via ORAL
  Filled 2024-01-16 (×15): qty 1

## 2024-01-16 MED ORDER — SODIUM CHLORIDE 0.9 % IV BOLUS
1000.0000 mL | Freq: Once | INTRAVENOUS | Status: AC
  Administered 2024-01-16: 1000 mL via INTRAVENOUS

## 2024-01-16 NOTE — H&P (Addendum)
 History and Physical    Scott [name] FMW:[id number] DOB: Sep 10, 1960 DOA: 01/16/2024  DOS: the patient was seen and examined on 01/16/2024  PCP: Everlene Parris LABOR, MD   Patient coming from: Home  I have personally briefly reviewed patient's old medical records in [facility]  Chief Complaint: Worsening weakness of bilateral upper and lower extremities  HPI: Scott [name] is a pleasant 63 y.o. male with medical history significant for chronic foot pain, arthritis, COPD, DVT/PE on Eliquis , CAD, neck pain s/p recent ACDF on 01/08/2024 for cervical spinal stenosis at C3-C4 level, recent admission after 4 days of that surgery after a fall, presented to ED at [facility] today with worsening weakness to his bilateral upper and lower extremities.  This has been ongoing for the past week but was worsened over time.  Patient was here on 9/22 for similar symptoms and he was eval by neurosurgery and no further intervention was needed at that time. Since patient was discharged home he continued to have worsening symptoms, he has not been able to pick up any of his medications including his Lyrica  or Eliquis .  Lives at home alone and has been having more difficulty using his walker to ambulate.  He stated that he slipped out of his chair onto the floor and has lower back pain now.  He stated that he did not hit his head, does not have incontinence or saddle anesthesia. EMS note: Patient was wheezing gave him 1 DuoNeb and route.  Patient was complaining of pain to his bilateral upper and lower extremity as well as back and neck and left foot.  He does have chronic pain and foot pain.  Denies any chest pain, shortness of breath, fever, palpitations, cough or urinary symptoms.  ED Course: Upon arrival to the ED, patient is found to be weak on both upper and lower extremities, chest x-ray without any acute pathology, x-ray of the left elbow no fractures, x-ray of the left shoulder no fracture, troponins negative,  BNP not elevated, no leukocytosis patient was hypoxemic at times required DuoNebs.  Hospitalist service was consulted for evaluation for admission for possible worsening weakness, back pain and left-sided sensory deficit.  Review of Systems:  ROS  All other systems negative except as noted in the HPI.  Past Medical History:  Diagnosis Date   AAA (abdominal aortic aneurysm) without rupture 02/23/2020   a.) AAA duplex 02/23/2020: 3.8 cm; b.) AAA duplex 11/28/2020: 3.7 cm; c.) CTA AO + BIFEM 12/18/2020: 3.8 x 3.4 cm; d.) AAA duplex 11/07/2021: 3.7 cm; e.) CTA AO + BIFEM 11/16/2021: 4.2 cm; f.) AAA duplex 05/28/2022: 4.0 cm; g.) AAA duplex 11/26/2022: 4.4 cm; h.) PET CT 10/01/2022: 4.3 cm; i.) AAA duplex 08/06/2023: 4.8 cm; j.) CTA abd/pel 11/20/2023: 4.8 x 4.6 cm   Alcoholism (HCC)    Anginal pain    Anxiety    Aortic atherosclerosis    Aortic root dilatation    Arthritis    Bilateral carotid artery disease 06/20/2020   a.) carotid doppler 06/20/2020: 1-39% RICA, 40-59% LICA; b.) carotid doppler 11/28/2020, 11/26/2022: 1-39% BICA   Bilateral inguinal hernia    CAD (coronary artery disease) 06/03/2017   a.) LHC 06/03/2017: 25% pLAD - med mgmt; b.) LHC 08/22/2021: 25% pLAD, 15% o-mLM, 25% dLM-pLAD, 25% o-mLCx, 25% oD2 - med mgmt   Carpal tunnel syndrome    Cigarette smoker    COPD (chronic obstructive pulmonary disease) (HCC)    COVID-19 2022   Deep vein thrombosis (DVT)  of left lower extremity (HCC)    Depression    Diastolic dysfunction    Diverticulosis    Dupuytren contracture of both hands    Dyspnea    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    a.) small area of chronic encephalomalacia in the LEFT inferior temporal gyrus   GERD (gastroesophageal reflux disease)    Hepatic steatosis    HLD (hyperlipidemia)    Hypertension    Incomplete left bundle branch block (LBBB)    Lung nodules    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   a.) PET CT 10/01/2022: 6 mm with SUV max of  6.4; b.) s.p FNA 11/05/2022 --> pathology negative for malignancy (DDx oncocytosis vs oncocytoma)   PAOD (peripheral arterial occlusive disease)    a.) s/p BILATERAL femoral endarterectomies 12/29/2020; b.) s/p aorto-bifemoral bypass   Pre-diabetes    Stenosis of cervical spine with myelopathy (HCC)    SVT (supraventricular tachycardia)    a.) s/p ablation 02/28/2016   Vitamin B12 deficiency    Vitamin D deficiency     Past Surgical History:  Procedure Laterality Date   ANKLE SURGERY     bracelet   ANTERIOR CERVICAL DECOMP/DISCECTOMY FUSION N/A 01/08/2024   Procedure: ANTERIOR CERVICAL DECOMPRESSION/DISCECTOMY FUSION 1 LEVEL;  Surgeon: Claudene Penne ORN, MD;  Location: [facility] ORS;  Service: Neurosurgery;  Laterality: N/A;  C3-4 ANTERIOR CERVICAL DISCECTOMY AND FUSION   AORTO-FEMORAL BYPASS GRAFT     CHOLECYSTECTOMY  2000   COLONOSCOPY     ENDARTERECTOMY FEMORAL Bilateral 12/29/2020   Procedure: ENDARTERECTOMY FEMORAL;  Surgeon: Marea Selinda RAMAN, MD;  Location: [facility] OR[facility];  Service: Vascular;  Laterality: Bilateral;  Left SFA and Tibial intervention   INSERTION OF MESH Bilateral 02/12/2023   Procedure: INSERTION OF MESH;  Surgeon: Rodolph Romano, MD;  Location: [facility] OR[facility];  Service: General;  Laterality: Bilateral;   LEFT HEART CATH AND CORONARY ANGIOGRAPHY Left 06/03/2017   Procedure: LEFT HEART CATH AND CORONARY ANGIOGRAPHY;  Surgeon: Florencio Cara BIRCH, MD;  Location: [facility];  Service: Cardiovascular;  Laterality: Left;   LEFT HEART CATH AND CORONARY ANGIOGRAPHY N/A 08/22/2021   Procedure: LEFT HEART CATH AND CORONARY ANGIOGRAPHY;  Surgeon: Florencio Cara BIRCH, MD;  Location: [facility];  Service: Cardiovascular;  Laterality: N/A;   LOWER EXTREMITY ANGIOGRAPHY Left 02/03/2020   Procedure: LOWER EXTREMITY ANGIOGRAPHY;  Surgeon: Marea Selinda RAMAN, MD;  Location: [facility];  Service: Cardiovascular;  Laterality: Left;   SVT ABLATION N/A 02/28/2016    TONSILLECTOMY       reports that he has been smoking cigarettes. He has never used smokeless tobacco. He reports current alcohol  use. He reports that he does not use drugs.  Allergies  Allergen Reactions   Acetaminophen -Codeine Other (See Comments)    Nausea and hot flash    Chantix [Varenicline] Nausea Only    Sour taste in mouth   Codeine Nausea Only   Gabapentin Swelling   Pregabalin  Swelling    Family History  Problem Relation Age of Onset   Diabetes Mother    Hypertension Mother    Lupus Mother    Heart attack Father    Cancer Brother    Cancer Brother     Prior to Admission medications   Medication Sig Start Date End Date Taking? Authorizing Provider  atorvastatin  (LIPITOR) 40 MG tablet Take 40 mg by mouth daily. Patient taking differently: Take 20 mg by mouth every morning. 12/07/20  Yes [provider]  cholecalciferol (VITAMIN  D3) 25 MCG (1000 UNIT) tablet Take 1,000 Units by mouth daily.   Yes [provider]  cloNIDine  (CATAPRES ) 0.1 MG tablet Take 0.1-0.2 mg by mouth See admin instructions. 0.2 mg every morning. If bp elevated later in the day, pt will take 0.1 mg tablet if needed   Yes [provider]  lisinopril -hydrochlorothiazide  (ZESTORETIC ) 20-12.5 MG tablet Take 1 tablet by mouth 2 (two) times daily.   Yes [provider]  nicotine  polacrilex (COMMIT) 4 MG lozenge Take 4 mg by mouth as needed. 01/14/24  Yes [provider]  Potassium 99 MG TABS Take 1 tablet by mouth daily at 6 (six) AM.   Yes [provider]  tiZANidine  (ZANAFLEX ) 4 MG tablet Take 0.5-1 tablets (2-4 mg total) by mouth every 8 (eight) hours as needed. 01/14/24 01/13/25 Yes Ulis Bottcher, PA-C  vitamin B-12 (CYANOCOBALAMIN ) 500 MCG tablet Take 500 mcg by mouth daily.   Yes [provider]  acetaminophen  (TYLENOL ) 500 MG tablet Take 2 tablets (1,000 mg total) by mouth every 6 (six) hours as needed. 01/08/24   Ulis Bottcher, PA-C   APIXABAN  (ELIQUIS ) VTE STARTER PACK (10MG  AND 5MG ) Take as directed on package: start with two-5mg  tablets twice daily for 7 days. On day 8, switch to one-5mg  tablet twice daily. 01/13/24   Laurita Cort DASEN, MD  aspirin  EC 81 MG tablet Take 81 mg by mouth daily. Swallow whole.    [provider]  budesonide-formoterol  (SYMBICORT) 160-4.5 MCG/ACT inhaler Inhale 2 puffs into the lungs 2 (two) times daily. 11/07/22   [provider]  cephALEXin  (KEFLEX ) 500 MG capsule Take 1 capsule (500 mg total) by mouth 3 (three) times daily. 01/08/24   Ulis Bottcher, PA-C  docusate sodium  (COLACE) 100 MG capsule Take 1 capsule (100 mg total) by mouth 2 (two) times daily. 01/08/24   Ulis Bottcher, PA-C  HYDROmorphone  (DILAUDID ) 2 MG tablet Take 1 tablet (2 mg total) by mouth every 4 (four) hours as needed for severe pain (pain score 7-10). 01/08/24   Ulis Bottcher, PA-C  ipratropium-albuterol  (DUONEB) 0.5-2.5 (3) MG/3ML SOLN Inhale 3 mLs into the lungs every 6 (six) hours as needed. 11/20/22 01/01/24  [provider]  metoprolol  succinate (TOPROL -XL) 100 MG 24 hr tablet Take 100 mg by mouth every morning. Take with or immediately following a meal.    [provider]  ondansetron  (ZOFRAN ) 4 MG tablet Take 1 tablet (4 mg total) by mouth every 8 (eight) hours as needed for nausea or vomiting. 01/08/24   Ulis Bottcher, PA-C  pregabalin  (LYRICA ) 50 MG capsule Take 1 capsule (50 mg total) by mouth 2 (two) times daily. 01/14/24   Ulis Bottcher, PA-C  senna (SENOKOT) 8.6 MG TABS tablet Take 1 tablet (8.6 mg total) by mouth daily. 01/08/24   Ulis Bottcher, PA-C    Physical Exam: Vitals:   01/16/24 0722 01/16/24 0730 01/16/24 0800 01/16/24 0836  BP:  (!) 154/103 (!) 146/95 (!) 145/84  Pulse: (!) 123 (!) 113 (!) 111 (!) 109  Resp: (!) 23 15 17    Temp: 98.2 F (36.8 C)   100.1 F (37.8 C)  TempSrc: Oral     SpO2: 90% 93% 94% 92%  Weight: 70.8 kg     Height: 5' 10 (1.778 m)        Physical Exam   Constitutional: Alert, awake, calm, comfortable HEENT: Neck supple Respiratory: Clear to auscultation B/L, no wheezing, no rales.  Cardiovascular: Regular rate and rhythm, no murmurs / rubs /  gallops. No extremity edema. 2+ pedal pulses. No carotid bruits.  Abdomen: Soft, no tenderness, Bowel sounds positive.  Musculoskeletal: Left hand is black and blue, mildly swollen, decreased motor power, right hand has multiple skin tears Skin: no rashes, lesions, ulcers. Neurologic: CN 2-12 grossly intact. Sensation intact, No focal deficit identified Psychiatric: Alert and oriented x 3. Normal mood.    Labs on Admission: I have personally reviewed following labs and imaging studies  CBC: Recent Labs  Lab 01/12/24 1827 01/15/24 2345  WBC 8.5 8.5  HGB 14.6 14.3  HCT 40.8 38.7*  MCV 96.7 94.6  PLT 226 242   Basic Metabolic Panel: Recent Labs  Lab 01/12/24 1827 01/15/24 2345  NA 129* 127*  K 3.8 3.3*  CL 91* 86*  CO2 24 25  GLUCOSE 93 90  BUN 10 9  CREATININE 0.52* 0.56*  CALCIUM  8.9 8.8*   GFR: Estimated Creatinine Clearance: 94.6 mL/min (A) (by C-G formula based on SCr of 0.56 mg/dL (L)). Liver Function Tests: Recent Labs  Lab 01/15/24 2345  AST 36  ALT 24  ALKPHOS 69  BILITOT 0.7  PROT 6.8  ALBUMIN  3.5   No results for input(s): LIPASE, AMYLASE in the last 168 hours. No results for input(s): AMMONIA in the last 168 hours. Coagulation Profile: Recent Labs  Lab 01/13/24 0022  INR 1.1   Cardiac Enzymes: Recent Labs  Lab 01/15/24 2345  TROPONINIHS 9   BNP (last 3 results) Recent Labs    01/15/24 2345  BNP 52.0   HbA1C: No results for input(s): HGBA1C in the last 72 hours. CBG: No results for input(s): GLUCAP in the last 168 hours. Lipid Profile: No results for input(s): CHOL, HDL, LDLCALC, TRIG, CHOLHDL, LDLDIRECT in the last 72 hours. Thyroid  Function Tests: No results for input(s): TSH, T4TOTAL,  FREET4, T3FREE, THYROIDAB in the last 72 hours. Anemia Panel: No results for input(s): VITAMINB12, FOLATE, FERRITIN, TIBC, IRON, RETICCTPCT in the last 72 hours. Urine analysis:    Component Value Date/Time   COLORURINE YELLOW (A) 01/16/2024 0720   APPEARANCEUR CLEAR (A) 01/16/2024 0720   LABSPEC 1.004 (L) 01/16/2024 0720   PHURINE 6.0 01/16/2024 0720   GLUCOSEU NEGATIVE 01/16/2024 0720   HGBUR NEGATIVE 01/16/2024 0720   BILIRUBINUR NEGATIVE 01/16/2024 0720   KETONESUR 5 (A) 01/16/2024 0720   PROTEINUR NEGATIVE 01/16/2024 0720   NITRITE NEGATIVE 01/16/2024 0720   LEUKOCYTESUR NEGATIVE 01/16/2024 0720    Radiological Exams on Admission: I have personally reviewed images DG Chest 1 View Result Date: 01/16/2024 CLINICAL DATA:  Shortness of breath EXAM: CHEST  1 VIEW COMPARISON:  01/12/2024 FINDINGS: Heart and mediastinal contours are within normal limits. No focal opacities or effusions. No acute bony abnormality. Old healed right lower rib fractures. No pneumothorax. IMPRESSION: No active cardiopulmonary disease. Electronically Signed   By: Franky Crease M.D.   On: 01/16/2024 01:59   DG Elbow Complete Left Result Date: 01/16/2024 CLINICAL DATA:  Pain EXAM: LEFT ELBOW - COMPLETE 3+ VIEW COMPARISON:  None Available. FINDINGS: No acute bony abnormality. Specifically, no fracture, subluxation, or dislocation. No joint effusion. IMPRESSION: No acute bony abnormality. Electronically Signed   By: Franky Crease M.D.   On: 01/16/2024 01:58   DG Shoulder Left Result Date: 01/16/2024 CLINICAL DATA:  Pain EXAM: LEFT SHOULDER - 2+ VIEW COMPARISON:  None Available. FINDINGS: Degenerative changes in the [facility] joint with joint space narrowing and spurring. Glenohumeral joint is maintained. No acute bony abnormality. Specifically, no fracture, subluxation, or dislocation. Soft tissues  are intact. IMPRESSION: Degenerative changes in the left AC joint. No acute bony abnormality. Electronically  Signed   By: Franky Crease M.D.   On: 01/16/2024 01:58    EKG: My personal interpretation of EKG shows: Sinus rhythm no ST elevation    Assessment/Plan Principal Problem:   Generalized weakness Active Problems:   Fall at home, initial encounter   Hematoma of upper extremity (Left)   COPD (chronic obstructive pulmonary disease) (HCC)   Hypercholesterolemia   Chronic foot pain (1ry area of Pain) (Left)   Pulmonary emboli (HCC)    Assessment and Plan:  63 year old male W/PMH of chronic foot pain, chronic pain syndrome, arthritis, COPD not on home oxygen, DVT/PE on Eliquis , CAD, history of cervical spinal stenosis s/p ACDF on 01/08/2024 came into ED complaining of generalized weakness and not feeling well.  1.  Generalized weakness s/p ACDF on 01/08/2024 - Patient will be placed in observation. - Patient was eval by neurosurgical team 4 days after the surgery when he was here 3 days ago. - Will get MRI of the spine. - Call neurosurgical consultation. - Continue medication that was prescribed. - PT/OT after seen by neurosurgical team  2.  COPD without exacerbation - Will continue nebulizer every 6 hours as needed for shortness of breath and wheezing  3.  Left upper extremity hematoma and pain, prior from previous admission - I am not sure but he does not have any fractures on x-rays. - He has distal pulse okay. - Will continue to monitor  4.  Chronic pain syndrome - Continue Lyrica  and pain medications  5.  DVT/PE - Resume Eliquis  - Watch for bleeding  6.  Hyperlipidemia - Continue statin  7.  HTN Continue metoprolol  100 mg XL every 24 hours  8.  Chronic smoking - Continue nicotine  patch  9.  Hyponatremia - Sodium is 127 - I suspect may be related to not eating and drinking - Will continue to monitor-once he started eating, it may improve.  10.  Hypokalemia - Potassium replaced - Continue to monitor potassium level     DVT prophylaxis: Eliquis  Code Status:  Full Code Family Communication: None Disposition Plan: Home Consults called: Neurosurgery Admission status: Observation, Telemetry bed   Nena Rebel, MD [facility] 01/16/2024, 11:15 AM

## 2024-01-16 NOTE — ED Notes (Signed)
 Called MRI to see when they are going to send for pt. No answer on phonex2 provider notified.

## 2024-01-16 NOTE — ED Provider Notes (Signed)
 [name] Provider Note    Event Date/Time   First MD Initiated Contact with Patient 01/16/24 0038     (approximate)   History   Weakness   HPI  Scott [name] is a 63 y.o. male with history of chronic foot pain, arthritis, COPD, DVT, PE on Eliquis , CAD, history of cervical spinal stenosis status post decompression and fusion, presenting with worsening weakness to his bilateral upper and lower extremities.  This has been ongoing for the past week but has worsened over time.  Also states that he has decreased sensation to his left upper and lower extremity, unsure how long this has been going on for.  States that he has not been able to pick up any of his medications including his Lyrica  or Eliquis .  Lives at home alone and has been having more difficulty using his walker to ambulate.  States that he slipped out of his chair onto the floor and has lower back pain now.  Did not hit his head.  No incontinence.  No saddle anesthesia.  Per independent history from EMS, he was noted to be wheezing and gave him 1 DuoNeb and route.  Patient was complaining to them of pain to his bilateral upper and lower extremity as well as back and neck and left foot.  He does have chronic foot pain.  He denies any chest pain, does note mild shortness of breath, no cough or fever.  No urinary symptoms.  History obtained from EMS as above.  On independent chart review, he was admitted September 22 for worsening upper and lower extremity weakness, also noted some shortness of breath, a CT showed a very small PE.  An MRI was obtained on the 22nd that showed interval C3, C4 ACDF with mildly improved moderate canal stenosis,, did show also large volume prevertebral edema.  He was seen by neurosurgery during his admission, no acute intervention.  He had fallen at that time and had numbness tingling to his lower extremities that resolved.  Neurosurgery did also had a telephone encounter with him on  24th where he was having extreme pain and decreased neurologic function which she thought was also secondary to primary neurologic versus muscle spasms, he was prescribed Lyrica .     Physical Exam   Triage Vital Signs: ED Triage Vitals [01/15/24 2334]  Encounter Vitals Group     BP (!) 145/104     Girls Systolic BP Percentile      Girls Diastolic BP Percentile      Boys Systolic BP Percentile      Boys Diastolic BP Percentile      Pulse Rate 94     Resp 16     Temp 98.2 F (36.8 C)     Temp Source Oral     SpO2 94 %     Weight      Height      Head Circumference      Peak Flow      Pain Score 8     Pain Loc      Pain Education      Exclude from Growth Chart     Most recent vital signs: Vitals:   01/16/24 0500 01/16/24 0530  BP: 113/77 (!) 139/93  Pulse: 90   Resp: 11 14  Temp:    SpO2: 95% 96%     General: Awake, no distress.  CV:  Good peripheral perfusion.  Resp:  Normal effort.  No thoracic cage  tenderness, he is diffusely wheezing Abd:  No distention.  Soft nontender Other:  No midline cervical tenderness, he does have mid thoracic and lumbar tenderness on palpation, able to range his right upper and lower extremity and left lower extremity, his left shoulder and elbow are tender on palpation with reduced range of motion, no tenderness to his bilateral lower extremity, his left lower extremity is edematous, he notes decrease sensation to his left upper and left lower extremity compared to the right, no sensory deficits to his face, cranial nerves are intact to his face, pupils are equal and reactive, extraocular movements are intact, he does have mild weakness to his left upper and lower extremity compared to the right, no saddle anesthesia.  Dry oral mucosa.  No palpable skull deformities or tenderness, no facial deformities or tenderness.  His anterior surgical site on his neck is clean dry and intact without overlying erythema, fluctuance, induration.  No  tenderness.   ED Results / Procedures / Treatments   Labs (all labs ordered are listed, but only abnormal results are displayed) Labs Reviewed  COMPREHENSIVE METABOLIC PANEL WITH GFR - Abnormal; Notable for the following components:      Result Value   Sodium 127 (*)    Potassium 3.3 (*)    Chloride 86 (*)    Creatinine, Ser 0.56 (*)    Calcium  8.8 (*)    Anion gap 16 (*)    All other components within normal limits  CBC - Abnormal; Notable for the following components:   RBC 4.09 (*)    HCT 38.7 (*)    MCH 35.0 (*)    MCHC 37.0 (*)    All other components within normal limits  BRAIN NATRIURETIC PEPTIDE  URINALYSIS, ROUTINE W REFLEX MICROSCOPIC  CBG MONITORING, ED  TROPONIN I (HIGH SENSITIVITY)     EKG  EKG shows, EKG is a lot of artifact due to patient movement sinus tachycardia, normal QTc, no obvious ischemic ST elevation, T wave flattening in V2, not significant changed compared to prior  Repeat EKG, sinus rhythm heart rate 95, normal QRS, normal QTc, no obvious ischemic ST elevation, T wave flattening in V2, not significant change compared to prior   RADIOLOGY On my independent interpretation, chest x-ray without obvious consolidation   PROCEDURES:  Critical Care performed: No  Procedures   MEDICATIONS ORDERED IN ED: Medications  ipratropium-albuterol  (DUONEB) 0.5-2.5 (3) MG/3ML nebulizer solution 3 mL (has no administration in time range)  HYDROmorphone  (DILAUDID ) injection 0.5 mg (has no administration in time range)  ondansetron  (ZOFRAN ) injection 4 mg (has no administration in time range)  HYDROmorphone  (DILAUDID ) injection 0.5 mg (0.5 mg Intravenous Given 01/16/24 0127)  ipratropium-albuterol  (DUONEB) 0.5-2.5 (3) MG/3ML nebulizer solution 9 mL (9 mLs Nebulization Given 01/16/24 0131)  methylPREDNISolone  sodium succinate (SOLU-MEDROL ) 125 mg/2 mL injection 125 mg (125 mg Intravenous Given 01/16/24 0127)  sodium chloride  0.9 % bolus 1,000 mL (1,000 mLs  Intravenous New Bag/Given 01/16/24 0203)     IMPRESSION / MDM / ASSESSMENT AND PLAN / ED COURSE  I reviewed the triage vital signs and the nursing notes.                              Differential diagnosis includes, but is not limited to, worsening edema postsurgery, worsening stenosis, cauda equina, spinal impingement, CVA, fracture, strain, sprain, for the shortness of breath and wheezing, considered COPD exacerbation, viral illness, pneumonia, patient states that  he has not been taking his Eliquis  also considered PE.  For his left lower extremity swelling, he did have a DVT ultrasound that was obtained on 22nd did not show any DVT.  Will get labs, EKG, troponin, chest x-ray, MRI total spine, MRI head.  Stroke alert was not activated given that patient is unclear when he started having the sensory deficits and worsening weakness, states that it must have occurred in the past week but not in the last day.  Will give some DuoNebs, Solu-Medrol  for COPD exacerbation, will also give him some Dilaudid  for pain.  Patient's presentation is most consistent with acute presentation with potential threat to life or bodily function.  Independent interpretation of labs and imaging below.  X-rays to his upper extremity without focal fracture.  Clinical course as below.  MRI still pending.  Consulted hospitalist for admission who is agreeable with the plan for admission and will evaluate the patient.  He is admitted.  The patient is on the cardiac monitor to evaluate for evidence of arrhythmia and/or significant heart rate changes.   Clinical Course as of 01/16/24 0631  Fri Jan 16, 2024  0159 Independent review of labs, BNP is not elevated, troponin is negative, he is hyponatremic, suspect this might be related to hypovolemia given his dry mucous membranes, he is getting fluids, LFTs are normal, no leukocytosis. [TT]  0243 DG Chest 1 View No active cardiopulmonary disease.  [TT]  0243 DG Elbow Complete  Left No acute bony abnormality.  [TT]  0243 DG Shoulder Left IMPRESSION: Degenerative changes in the left AC joint. No acute bony abnormality.   [TT]  0410 On reassessment patient was sleeping, saturations dipped down into the high 80s, he states that he does not use a CPAP machine or any oxygen at night, once he woke up, sats went up to the low 90s.  Will put in for as needed DuoNebs.  He denies any shortness of breath at this time. [TT]    Clinical Course User Index [TT] Waymond Lorelle Cummins, MD     FINAL CLINICAL IMPRESSION(S) / ED DIAGNOSES   Final diagnoses:  Weakness  COPD exacerbation (HCC)  Decreased sensation  Back pain, unspecified back location, unspecified back pain laterality, unspecified chronicity  Fall, initial encounter  H/O medication noncompliance  Left-sided sensory deficit present     Rx / DC Orders   ED Discharge Orders     None        Note:  This document was prepared using Dragon voice recognition software and may include unintentional dictation errors.    Waymond Lorelle Cummins, MD 01/16/24 [phone]

## 2024-01-16 NOTE — Evaluation (Signed)
 Occupational Therapy Evaluation Patient Details Name: Scott [name] MRN: [id number] DOB: Jun 09, 1960 Today's Date: 01/16/2024   History of Present Illness   Pt is a 63 y.o. male presented to ED at [facility] with worsening weakness to his bilateral upper and lower extremities. He is s/p recent ACDF on 01/08/2024 for cervical spinal stenosis at C3-C4 level, recent admission after 4 days of that surgery after a fall with neuro follow up with no further intervention recommended. PMH of chronic foot pain, arthritis, COPD, DVT/PE on Eliquis , CAD, neck pain, HTN, PVD, AAA, GERD, anxiety, depression, etoh abuse.     Clinical Impressions Pt was seen for OT evaluation this date. PTA, pt resides in a one level apartment with his disabled sister. Typically uses a RW vs no AD for mobility. Multiple recent falls. Pt presents with deficits in strength, balance, and activity tolerance, affecting safe and optimal ADL completion. Pt currently requires CGA for rolling and Min A to reach upright position with cues for log roll technique. He was re-educated on cervical precautions and verbalized understanding. Pt required Max A to donn bil socks. He demo STS and stand step pivot to recliner using RW with Min A x1-2 (nurse present) and verb cues for safety and hand/feet placement. He is far from his baseline function and a high falls risk. Will follow acutely to maximize strength and ability to return to PLOF. Do anticipate the need for follow up OT services upon acute hospital DC.      If plan is discharge home, recommend the following:   A little help with walking and/or transfers;A lot of help with bathing/dressing/bathroom;Help with stairs or ramp for entrance;Assist for transportation;Assistance with cooking/housework     Functional Status Assessment   Patient has had a recent decline in their functional status and demonstrates the ability to make significant improvements in function in a reasonable and  predictable amount of time.     Equipment Recommendations   Other (comment) (defer to next venue)     Recommendations for Other Services         Precautions/Restrictions   Precautions Precautions: Cervical;Fall Precaution Booklet Issued: Yes (comment) Recall of Precautions/Restrictions: Impaired Restrictions Weight Bearing Restrictions Per Provider Order: No     Mobility Bed Mobility Overal bed mobility: Needs Assistance Bed Mobility: Sidelying to Sit, Rolling Rolling: Contact guard assist, Used rails Sidelying to sit: Min assist, Used rails       General bed mobility comments: cues for log roll technique and assist to bring trunk upright    Transfers Overall transfer level: Needs assistance Equipment used: Rolling walker (2 wheels) Transfers: Sit to/from Stand Sit to Stand: Min assist           General transfer comment: to stand from EOB and to stand step pivot to recliner with unsteadiness noted during turn, cues for safety      Balance Overall balance assessment: Needs assistance Sitting-balance support: Feet supported Sitting balance-Leahy Scale: Good     Standing balance support: Bilateral upper extremity supported, Reliant on assistive device for balance Standing balance-Leahy Scale: Poor Standing balance comment: RW and Min A                           ADL either performed or assessed with clinical judgement   ADL Overall ADL's : Needs assistance/impaired     Grooming: Wash/dry face;Sitting;Set up               Lower  Body Dressing: Maximal assistance;Sitting/lateral leans Lower Body Dressing Details (indicate cue type and reason): to donn bil socks Toilet Transfer: Minimal assistance;Rolling walker (2 wheels) Toilet Transfer Details (indicate cue type and reason): simulated to recliner using RW via stand step pivot         Functional mobility during ADLs: Minimal assistance;Rolling walker (2 wheels)       Vision  Patient Visual Report: No change from baseline       Perception         Praxis         Pertinent Vitals/Pain Pain Assessment Pain Assessment: Faces Faces Pain Scale: Hurts little more Pain Location: neck/hands Pain Descriptors / Indicators: Discomfort Pain Intervention(s): Repositioned, Monitored during session     Extremity/Trunk Assessment Upper Extremity Assessment Upper Extremity Assessment: Generalized weakness;Right hand dominant (bil carpal tunnel with ROM deficits and limited functional grip)   Lower Extremity Assessment Lower Extremity Assessment: Generalized weakness       Communication Communication Communication: No apparent difficulties   Cognition Arousal: Alert Behavior During Therapy: WFL for tasks assessed/performed, Restless Cognition: No apparent impairments                               Following commands: Intact       Cueing  General Comments   Cueing Techniques: Verbal cues;Visual cues  plantar sores on his feet reported. skin tears on bil hands with severe bruising; HR 115-118 during session and sp02 91-92% with all tasks on 2L 02 via [place]   Exercises Other Exercises Other Exercises: Edu on role of OT in acute setting and DC recommendations.   Shoulder Instructions      Home Living Family/patient expects to be discharged to:: Private residence Living Arrangements: Other relatives (disabled sister) Available Help at Discharge: Friend(s);Available PRN/intermittently Type of Home: Apartment Home Access: Level entry     Home Layout: One level           Bathroom Accessibility: No   Home Equipment: Rollator (4 wheels);Wheelchair - manual   Additional Comments: pt states that his friend comes by frequently, is having a home-health aid start with him on 9/23      Prior Functioning/Environment Prior Level of Function : Needs assist;History of Falls (last six months)             Mobility Comments: Pt states that  he typically uses RW for mobility, has not been able to recently due to his sister using RW. has had trouble using AD due to bilateral hand pain. had a fall just prior to surgery this AM; reports 4-5 falls this week ADLs Comments: Pt's friend assists with cooking, cleaning. IND with dressing and bathing    OT Problem List: Decreased strength;Decreased activity tolerance;Decreased safety awareness;Impaired balance (sitting and/or standing);Impaired UE functional use;Decreased coordination   OT Treatment/Interventions: Self-care/ADL training;Therapeutic exercise;Therapeutic activities;Energy conservation;Patient/family education;DME and/or AE instruction;Balance training      OT Goals(Current goals can be found in the care plan section)   Acute Rehab OT Goals Patient Stated Goal: improve function/safety/strength OT Goal Formulation: With patient Time For Goal Achievement: 01/30/24 Potential to Achieve Goals: Good ADL Goals Pt Will Perform Lower Body Bathing: with set-up;sit to/from stand;sitting/lateral leans Pt Will Perform Lower Body Dressing: with supervision;with set-up;sitting/lateral leans;sit to/from stand;with adaptive equipment Pt Will Transfer to Toilet: with supervision;ambulating;regular height toilet;with modified independence   OT Frequency:  Min 2X/week    Co-evaluation  AM-PAC OT 6 Clicks Daily Activity     Outcome Measure Help from another person eating meals?: A Little Help from another person taking care of personal grooming?: A Little Help from another person toileting, which includes using toliet, bedpan, or urinal?: A Lot Help from another person bathing (including washing, rinsing, drying)?: A Lot Help from another person to put on and taking off regular upper body clothing?: A Lot Help from another person to put on and taking off regular lower body clothing?: A Lot 6 Click Score: 14   End of Session Equipment Utilized During Treatment:  Gait belt;Rolling walker (2 wheels);Oxygen Nurse Communication: Mobility status  Activity Tolerance: Patient tolerated treatment well Patient left: in chair;with call bell/phone within reach;with chair alarm set  OT Visit Diagnosis: Other abnormalities of gait and mobility (R26.89);Muscle weakness (generalized) (M62.81)                Time: 8581-8554 OT Time Calculation (min): 27 min Charges:  OT General Charges $OT Visit: 1 Visit OT Evaluation $OT Eval Moderate Complexity: 1 Mod OT Treatments $Therapeutic Activity: 8-22 mins Vietta Bonifield, OTR/L  01/16/24, 3:49 PM  Quinnie Barcelo E Hieu Herms 01/16/2024, 3:45 PM

## 2024-01-17 DIAGNOSIS — Z720 Tobacco use: Secondary | ICD-10-CM | POA: Diagnosis not present

## 2024-01-17 DIAGNOSIS — G959 Disease of spinal cord, unspecified: Secondary | ICD-10-CM | POA: Diagnosis not present

## 2024-01-17 DIAGNOSIS — R531 Weakness: Secondary | ICD-10-CM | POA: Diagnosis not present

## 2024-01-17 DIAGNOSIS — Z91199 Patient's noncompliance with other medical treatment and regimen due to unspecified reason: Secondary | ICD-10-CM | POA: Diagnosis not present

## 2024-01-17 LAB — BASIC METABOLIC PANEL WITH GFR
Anion gap: 12 (ref 5–15)
BUN: 9 mg/dL (ref 8–23)
CO2: 28 mmol/L (ref 22–32)
Calcium: 9 mg/dL (ref 8.9–10.3)
Chloride: 91 mmol/L — ABNORMAL LOW (ref 98–111)
Creatinine, Ser: 0.47 mg/dL — ABNORMAL LOW (ref 0.61–1.24)
GFR, Estimated: >60 mL/min (ref >60)
Glucose, Bld: 186 mg/dL — ABNORMAL HIGH (ref 70–99)
Potassium: 3.2 mmol/L — ABNORMAL LOW (ref 3.5–5.1)
Sodium: 131 mmol/L — ABNORMAL LOW (ref 135–145)

## 2024-01-17 MED ORDER — LORAZEPAM 1 MG PO TABS
1.0000 mg | ORAL_TABLET | ORAL | Status: AC | PRN
  Administered 2024-01-18 – 2024-01-19 (×3): 1 mg via ORAL
  Filled 2024-01-17 (×3): qty 1

## 2024-01-17 MED ORDER — LORAZEPAM 2 MG/ML IJ SOLN: 1.0000 mg | INTRAMUSCULAR | Status: AC | PRN

## 2024-01-17 MED ORDER — HYDROMORPHONE HCL 2 MG PO TABS
2.0000 mg | ORAL_TABLET | ORAL | Status: DC | PRN
  Administered 2024-01-17 – 2024-01-23 (×16): 2 mg via ORAL
  Filled 2024-01-17 (×17): qty 1

## 2024-01-17 MED ORDER — FOLIC ACID 1 MG PO TABS
1.0000 mg | ORAL_TABLET | Freq: Every day | ORAL | Status: DC
  Administered 2024-01-17 – 2024-01-23 (×7): 1 mg via ORAL
  Filled 2024-01-17 (×7): qty 1

## 2024-01-17 MED ORDER — CLONIDINE HCL 0.1 MG PO TABS
0.1000 mg | ORAL_TABLET | Freq: Three times a day (TID) | ORAL | Status: DC
  Administered 2024-01-17 – 2024-01-23 (×19): 0.1 mg via ORAL
  Filled 2024-01-17 (×20): qty 1

## 2024-01-17 MED ORDER — FLUTICASONE FUROATE-VILANTEROL 200-25 MCG/ACT IN AEPB
1.0000 | INHALATION_SPRAY | Freq: Every day | RESPIRATORY_TRACT | Status: DC
Start: 2024-01-17 — End: 2024-01-23
  Administered 2024-01-19 – 2024-01-23 (×5): 1 via RESPIRATORY_TRACT

## 2024-01-17 MED ORDER — THIAMINE HCL 100 MG/ML IJ SOLN
100.0000 mg | Freq: Every day | INTRAMUSCULAR | Status: DC
Start: 2024-01-17 — End: 2024-01-23

## 2024-01-17 MED ORDER — FLUTICASONE FUROATE-VILANTEROL 200-25 MCG/ACT IN AEPB
1.0000 | INHALATION_SPRAY | Freq: Every day | RESPIRATORY_TRACT | Status: DC
Start: 2024-01-17 — End: 2024-01-17
  Administered 2024-01-17: 1 via RESPIRATORY_TRACT
  Filled 2024-01-17: qty 28

## 2024-01-17 MED ORDER — THIAMINE MONONITRATE 100 MG PO TABS
100.0000 mg | ORAL_TABLET | Freq: Every day | ORAL | Status: DC
  Administered 2024-01-17 – 2024-01-23 (×7): 100 mg via ORAL
  Filled 2024-01-17 (×7): qty 1

## 2024-01-17 MED ORDER — SENNA 8.6 MG PO TABS
1.0000 | ORAL_TABLET | Freq: Every day | ORAL | Status: DC
  Administered 2024-01-17 – 2024-01-19 (×2): 8.6 mg via ORAL
  Filled 2024-01-17 (×7): qty 1

## 2024-01-17 MED ORDER — ADULT MULTIVITAMIN W/MINERALS CH
1.0000 | ORAL_TABLET | Freq: Every day | ORAL | Status: DC
Start: 2024-01-17 — End: 2024-01-23
  Administered 2024-01-17 – 2024-01-23 (×7): 1 via ORAL
  Filled 2024-01-17 (×7): qty 1

## 2024-01-17 MED ORDER — HYDRALAZINE HCL 20 MG/ML IJ SOLN
5.0000 mg | INTRAMUSCULAR | Status: AC | PRN
  Administered 2024-01-17: 5 mg via INTRAVENOUS
  Filled 2024-01-17: qty 1

## 2024-01-17 MED ORDER — FLUTICASONE FUROATE-VILANTEROL 200-25 MCG/ACT IN AEPB
1.0000 | INHALATION_SPRAY | Freq: Every day | RESPIRATORY_TRACT | Status: DC
  Filled 2024-01-17: qty 28

## 2024-01-17 NOTE — Plan of Care (Signed)
  Problem: Health Behavior/Discharge Planning: Goal: Ability to manage health-related needs will improve Outcome: Progressing   Problem: Clinical Measurements: Goal: Ability to maintain clinical measurements within normal limits will improve Outcome: Progressing Goal: Diagnostic test results will improve Outcome: Progressing Goal: Cardiovascular complication will be avoided Outcome: Progressing

## 2024-01-17 NOTE — Discharge Instructions (Signed)

## 2024-01-17 NOTE — Progress Notes (Signed)
 PROGRESS NOTE    Scott [name]  FMW:[id number] DOB: Nov 26, 1960 DOA: 01/16/2024 PCP: Everlene Parris LABOR, MD  Outpatient Specialists: neurosurgery    Brief Narrative:   From admission h and p  JACSON [name] is a pleasant 63 y.o. male with medical history significant for chronic foot pain, arthritis, COPD, DVT/PE on Eliquis , CAD, neck pain s/p recent ACDF on 01/08/2024 for cervical spinal stenosis at C3-C4 level, recent admission after 4 days of that surgery after a fall, presented to ED at [facility] today with worsening weakness to his bilateral upper and lower extremities.  This has been ongoing for the past week but was worsened over time.  Patient was here on 9/22 for similar symptoms and he was eval by neurosurgery and no further intervention was needed at that time. Since patient was discharged home he continued to have worsening symptoms, he has not been able to pick up any of his medications including his Lyrica  or Eliquis .  Lives at home alone and has been having more difficulty using his walker to ambulate.  He stated that he slipped out of his chair onto the floor and has lower back pain now.  He stated that he did not hit his head, does not have incontinence or saddle anesthesia. EMS note: Patient was wheezing gave him 1 DuoNeb and route.  Patient was complaining of pain to his bilateral upper and lower extremity as well as back and neck and left foot.  He does have chronic pain and foot pain.  Denies any chest pain, shortness of breath, fever, palpitations, cough or urinary symptoms.  Assessment & Plan:   Principal Problem:   Generalized weakness Active Problems:   Fall at home, initial encounter   Hematoma of upper extremity (Left)   CAD (coronary artery disease)   COPD (chronic obstructive pulmonary disease) (HCC)   Hypercholesterolemia   Chronic foot pain (1ry area of Pain) (Left)   Alcohol  abuse   Pulmonary emboli (HCC)  # Cervical myelopathy S/p recent ACDF. MRI doesn't  appear to show any acute changes - neurosurg to see - PT advising SNF, patient agreeable, TOC consulted - home prn dialudid  # PE, acute # History dvt Small - continue apixaban   # Alcohol  abuse Admits to 4 drinks a day. No s/s withdrawal currently - vitamins - ciwa  # HTN Bp elevated - home meds for now  # COPD Stable - breo for home symbicort  # PAD - will d/c home asa given recent start of apixaban    DVT prophylaxis: apixaban  Code Status: full Family Communication: none at bedside  Level of care: Med-Surg Status is: Observation    Consultants:  neurosurgery  Procedures: none  Antimicrobials:  none    Subjective: Reports feeling fine  Objective: Vitals:   01/17/24 0522 01/17/24 0649 01/17/24 0719 01/17/24 0748  BP: (!) 188/107 (!) 179/116 (!) 144/98 (!) 174/102  Pulse: 89 75 97 77  Resp:   16 18  Temp:   98.3 F (36.8 C) (!) 97.5 F (36.4 C)  TempSrc:      SpO2: 99%  99% 92%  Weight:      Height:        Intake/Output Summary (Last 24 hours) at 01/17/2024 1021 Last data filed at 01/17/2024 0429 Gross per 24 hour  Intake 200 ml  Output 2350 ml  Net -2150 ml   Filed Weights   01/16/24 0722  Weight: 70.8 kg    Examination:  General exam: Appears calm and comfortable  Respiratory system: few scattered rhonchi, normal wob Cardiovascular system: S1 & S2 heard, RRR.   Gastrointestinal system: Abdomen is nondistended, soft and nontender.  Central nervous system: Alert and oriented. Decreased strength 2/2 pain upper and lower extremities Extremities: Symmetric 5 x 5 power. Skin: bruising on arms, healing incision anterior neck c/d/i Psychiatry: calm    Data Reviewed: I have personally reviewed following labs and imaging studies  CBC: Recent Labs  Lab 01/12/24 1827 01/15/24 2345  WBC 8.5 8.5  HGB 14.6 14.3  HCT 40.8 38.7*  MCV 96.7 94.6  PLT 226 242   Basic Metabolic Panel: Recent Labs  Lab 01/12/24 1827 01/15/24 2345  01/17/24 0950  NA 129* 127* 131*  K 3.8 3.3* 3.2*  CL 91* 86* 91*  CO2 24 25 28   GLUCOSE 93 90 186*  BUN 10 9 9   CREATININE 0.52* 0.56* 0.47*  CALCIUM  8.9 8.8* 9.0   GFR: Estimated Creatinine Clearance: 94.6 mL/min (A) (by C-G formula based on SCr of 0.47 mg/dL (L)). Liver Function Tests: Recent Labs  Lab 01/15/24 2345  AST 36  ALT 24  ALKPHOS 69  BILITOT 0.7  PROT 6.8  ALBUMIN  3.5   No results for input(s): LIPASE, AMYLASE in the last 168 hours. No results for input(s): AMMONIA in the last 168 hours. Coagulation Profile: Recent Labs  Lab 01/13/24 0022  INR 1.1   Cardiac Enzymes: No results for input(s): CKTOTAL, CKMB, CKMBINDEX, TROPONINI in the last 168 hours. BNP (last 3 results) No results for input(s): PROBNP in the last 8760 hours. HbA1C: No results for input(s): HGBA1C in the last 72 hours. CBG: No results for input(s): GLUCAP in the last 168 hours. Lipid Profile: No results for input(s): CHOL, HDL, LDLCALC, TRIG, CHOLHDL, LDLDIRECT in the last 72 hours. Thyroid  Function Tests: No results for input(s): TSH, T4TOTAL, FREET4, T3FREE, THYROIDAB in the last 72 hours. Anemia Panel: No results for input(s): VITAMINB12, FOLATE, FERRITIN, TIBC, IRON, RETICCTPCT in the last 72 hours. Urine analysis:    Component Value Date/Time   COLORURINE YELLOW (A) 01/16/2024 0720   APPEARANCEUR CLEAR (A) 01/16/2024 0720   LABSPEC 1.004 (L) 01/16/2024 0720   PHURINE 6.0 01/16/2024 0720   GLUCOSEU NEGATIVE 01/16/2024 0720   HGBUR NEGATIVE 01/16/2024 0720   BILIRUBINUR NEGATIVE 01/16/2024 0720   KETONESUR 5 (A) 01/16/2024 0720   PROTEINUR NEGATIVE 01/16/2024 0720   NITRITE NEGATIVE 01/16/2024 0720   LEUKOCYTESUR NEGATIVE 01/16/2024 0720   Sepsis Labs: @LABRCNTIP (procalcitonin:4,lacticidven:4)  ) Recent Results (from the past 240 hours)  Resp panel by RT-PCR (RSV, Flu A&B, Covid) Anterior Nasal Swab     Status: None    Collection Time: 01/16/24 10:10 AM   Specimen: Anterior Nasal Swab  Result Value Ref Range Status   SARS Coronavirus 2 by RT PCR NEGATIVE NEGATIVE Final    Comment: (NOTE) SARS-CoV-2 target nucleic acids are NOT DETECTED.  The SARS-CoV-2 RNA is generally detectable in upper respiratory specimens during the acute phase of infection. The lowest concentration of SARS-CoV-2 viral copies this assay can detect is 138 copies/mL. A negative result does not preclude SARS-Cov-2 infection and should not be used as the sole basis for treatment or other patient management decisions. A negative result may occur with  improper specimen collection/handling, submission of specimen other than nasopharyngeal swab, presence of viral mutation(s) within the areas targeted by this assay, and inadequate number of viral copies(<138 copies/mL). A negative result must be combined with clinical observations, patient history, and epidemiological information. The expected result  is Negative.  Fact Sheet for Patients:  BloggerCourse.com  Fact Sheet for Healthcare Providers:  SeriousBroker.it  This test is no t yet approved or cleared by the [place]  FDA and  has been authorized for detection and/or diagnosis of SARS-CoV-2 by FDA under an Emergency Use Authorization (EUA). This EUA will remain  in effect (meaning this test can be used) for the duration of the COVID-19 declaration under Section 564(b)(1) of the Act, 21 U.S.C.section 360bbb-3(b)(1), unless the authorization is terminated  or revoked sooner.       Influenza A by PCR NEGATIVE NEGATIVE Final   Influenza B by PCR NEGATIVE NEGATIVE Final    Comment: (NOTE) The Xpert Xpress SARS-CoV-2/FLU/RSV plus assay is intended as an aid in the diagnosis of influenza from Nasopharyngeal swab specimens and should not be used as a sole basis for treatment. Nasal washings and aspirates are unacceptable for  Xpert Xpress SARS-CoV-2/FLU/RSV testing.  Fact Sheet for Patients: BloggerCourse.com  Fact Sheet for Healthcare Providers: SeriousBroker.it  This test is not yet approved or cleared by the [place]  FDA and has been authorized for detection and/or diagnosis of SARS-CoV-2 by FDA under an Emergency Use Authorization (EUA). This EUA will remain in effect (meaning this test can be used) for the duration of the COVID-19 declaration under Section 564(b)(1) of the Act, 21 U.S.C. section 360bbb-3(b)(1), unless the authorization is terminated or revoked.     Resp Syncytial Virus by PCR NEGATIVE NEGATIVE Final    Comment: (NOTE) Fact Sheet for Patients: BloggerCourse.com  Fact Sheet for Healthcare Providers: SeriousBroker.it  This test is not yet approved or cleared by the [place]  FDA and has been authorized for detection and/or diagnosis of SARS-CoV-2 by FDA under an Emergency Use Authorization (EUA). This EUA will remain in effect (meaning this test can be used) for the duration of the COVID-19 declaration under Section 564(b)(1) of the Act, 21 U.S.C. section 360bbb-3(b)(1), unless the authorization is terminated or revoked.  Performed at [facility], [address], [place], [place] [address]          Radiology Studies: MR BRAIN WO CONTRAST Result Date: 01/16/2024 CLINICAL DATA:  Provided history: Neuro deficit, acute, stroke suspected. Additional history provided: Worsening upper and lower extremity weakness. EXAM: MRI HEAD WITHOUT CONTRAST TECHNIQUE: Multiplanar, multiecho pulse sequences of the brain and surrounding structures were obtained without intravenous contrast. COMPARISON:  Brain MRI 06/11/2022. FINDINGS: Brain: No age-advanced or lobar predominant cerebral atrophy. Redemonstrated focus of chronic encephalomalacia/gliosis within the inferolateral  left temporal lobe. Mild multifocal T2 FLAIR hyperintense signal abnormality elsewhere within the cerebral white matter, nonspecific but compatible with chronic small vessel ischemic disease. Several nonspecific chronic microhemorrhages scattered within the supratentorial brain, progressed in number. There is no acute infarct. No evidence of an intracranial mass. No extra-axial fluid collection. No midline shift. Vascular: Maintained flow voids within the proximal large arterial vessels. Skull and upper cervical spine: No focal worrisome calvarial marrow lesion. Cervical spine findings separately reported on same day cervical spine MRI. Sinuses/Orbits: No mass or acute finding within the imaged orbits. Mucous retention cysts within the right maxillary sinus measuring up to 14 mm. Trace mucosal thickening within the left maxillary sinus. Mild mucosal thickening within the right sphenoid sinus. Other: Redemonstrated dermal cysts within the left face and right occipital region. IMPRESSION: 1.  No evidence of an acute intracranial abnormality. 2. Unchanged focus of chronic encephalomalacia/gliosis within the inferolateral left temporal lobe, nonspecific but likely post-traumatic in this location. 3. Mild  chronic small vessel ischemic changes within the cerebral white matter, similar to the prior MRI of 06/11/2022. 4. Several nonspecific chronic microhemorrhages within the supratentorial brain, increased in number. 5. Paranasal sinus disease as described. Electronically Signed   By: Rockey Childs D.O.   On: 01/16/2024 13:58   MR Cervical Spine Wo Contrast Result Date: 01/16/2024 CLINICAL DATA:  Neck pain with worsening weakness in all 4 extremities. History of cervical decompression and fusion 01/08/2024. EXAM: MRI CERVICAL, THORACIC AND LUMBAR SPINE WITHOUT CONTRAST TECHNIQUE: Multiplanar and multiecho pulse sequences of the cervical spine, to include the craniocervical junction and cervicothoracic junction, and  thoracic and lumbar spine, were obtained without intravenous contrast. COMPARISON:  Cervical MRI 01/12/2024 and 11/15/2023. Lumbar MRI 11/15/2023. Chest CTA 01/12/2024. Abdominopelvic CT 11/20/2023. FINDINGS: Technical note: Despite efforts by the technologist and patient, mild motion artifact is present on today's exam and could not be eliminated. This reduces exam sensitivity and specificity. MRI CERVICAL SPINE FINDINGS Alignment: The alignment is stable with straightening. No focal angulation or significant listhesis. Vertebrae: Postsurgical changes from recent C3-4 ACDF. No hardware displacement or acute fracture identified. Heterogeneous marrow edema appears similar to prior studies, likely discogenic. Assessment limited by motion. No specific evidence of discitis or osteomyelitis. Cord: Diffuse effacement of the CSF surrounding the cord, similar to prior studies. Possible mild T2 hyperintensity in the cord at C3, suboptimally evaluated due to motion. No epidural fluid collections are identified. Posterior Fossa, vertebral arteries, paraspinal tissues: Visualized portions of the posterior fossa appear unremarkable.Intracranial findings are dictated separately. Preserved vertebral artery flow voids. Similar prevertebral soft tissue swelling, likely related to recent surgery. Disc levels: Cervical disc space assessment limited by motion artifact. No gross change from recent prior postoperative study. There is multilevel spondylosis contributing to mild spinal stenosis and variable degrees of foraminal narrowing at multiple levels. As above, possible T2 hyperintensity in the cord at C3, suboptimally evaluated due to motion. MRI THORACIC SPINE FINDINGS Alignment: Mild convex right scoliosis. No focal angulation or listhesis. Vertebrae: No acute or suspicious osseous findings. Cord:  The thoracic cord appears normal in signal and caliber. Paraspinal and other soft tissues: No significant paraspinal abnormalities.  Disc levels: Very mild thoracic spondylosis with mild disc bulging and endplate osteophyte formation at T9-10, T10-11 and T11-12. No focal disc herniation, significant spinal stenosis or foraminal narrowing demonstrated. MRI LUMBAR SPINE FINDINGS Segmentation: Conventional anatomy assumed, with the last open disc space designated L5-S1.Concordant with prior imaging. Alignment: Stable mild convex left scoliosis and trace retrolisthesis at L2-3. Vertebrae: No worrisome osseous lesion, acute fracture or pars defect. Asymmetric facet arthropathy on the right at L3-4. Conus medullaris: Extends to the L1 level and appears normal. Paraspinal and other soft tissues: No acute paraspinal findings. Mild soft tissue edema around the right L3-4 facet joint. 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 This is suboptimally evaluated by this examination, although appears grossly similar to previous lumbar MRI and abdominal CTA. Disc levels: L1-2: Preserved disc height. Mild facet hypertrophy. No spinal stenosis or foraminal narrowing. L2-3: Mild loss of disc height with annular disc bulging and endplate osteophytes. Mild facet and ligamentous hypertrophy. Unchanged mild spinal stenosis and mild lateral recess narrowing on the right. No significant foraminal narrowing or nerve root impingement. L3-4: Mild loss of disc height with annular disc bulging, endplate osteophytes and moderate facet/ligamentous hypertrophy. Resulting moderate multifactorial spinal stenosis with asymmetric right lateral recess  and right foraminal narrowing. Findings are similar to previous MRI. L4-5: Chronic loss of disc height with annular disc bulging, facet and ligamentous hypertrophy. Similar mild spinal stenosis with asymmetric left lateral recess narrowing. Mild foraminal narrowing bilaterally without L4 nerve root impingement. L5-S1: Mild chronic  loss of disc height with annular disc bulging and endplate osteophytes asymmetric to the left. Bilateral facet hypertrophy. Stable mild left foraminal narrowing. The spinal canal and right foramen are patent. IMPRESSION: 1. Motion degraded examination, especially of the cervical spine. 2. Postsurgical changes from recent C3-4 ACDF. No hardware displacement or acute fracture identified. 3. Diffuse effacement of the CSF surrounding the cord in the cervical spine, similar to prior studies. Possible mild T2 hyperintensity in the cord at C3, suboptimally evaluated due to motion. 4. Given the motion limitations of this examination, consider further evaluation with CT of the cervical spine. 5. No acute findings or significant changes in the thoracic or lumbar spine. 6. 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 7. 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 This is suboptimally evaluated by this examination, although appears grossly similar to previous lumbar MRI and abdominal CTA. See follow up recommendations from abdominal CTA 11/20/2023. Electronically Signed   By: Elsie Perone M.D.   On: 01/16/2024 13:31   MR THORACIC SPINE WO CONTRAST Result Date: 01/16/2024 CLINICAL DATA:  Neck pain with worsening weakness in all 4 extremities. History of cervical decompression and fusion 01/08/2024. EXAM: MRI CERVICAL, THORACIC AND LUMBAR SPINE WITHOUT CONTRAST TECHNIQUE: Multiplanar and multiecho pulse sequences of the cervical spine, to include the craniocervical junction and cervicothoracic junction, and thoracic and lumbar spine, were obtained without intravenous contrast. COMPARISON:  Cervical MRI 01/12/2024 and 11/15/2023. Lumbar MRI 11/15/2023. Chest CTA 01/12/2024. Abdominopelvic CT 11/20/2023. FINDINGS: Technical note: Despite efforts by the technologist and patient, mild motion artifact is present on today's exam and  could not be eliminated. This reduces exam sensitivity and specificity. MRI CERVICAL SPINE FINDINGS Alignment: The alignment is stable with straightening. No focal angulation or significant listhesis. Vertebrae: Postsurgical changes from recent C3-4 ACDF. No hardware displacement or acute fracture identified. Heterogeneous marrow edema appears similar to prior studies, likely discogenic. Assessment limited by motion. No specific evidence of discitis or osteomyelitis. Cord: Diffuse effacement of the CSF surrounding the cord, similar to prior studies. Possible mild T2 hyperintensity in the cord at C3, suboptimally evaluated due to motion. No epidural fluid collections are identified. Posterior Fossa, vertebral arteries, paraspinal tissues: Visualized portions of the posterior fossa appear unremarkable.Intracranial findings are dictated separately. Preserved vertebral artery flow voids. Similar prevertebral soft tissue swelling, likely related to recent surgery. Disc levels: Cervical disc space assessment limited by motion artifact. No gross change from recent prior postoperative study. There is multilevel spondylosis contributing to mild spinal stenosis and variable degrees of foraminal narrowing at multiple levels. As above, possible T2 hyperintensity in the cord at C3, suboptimally evaluated due to motion. MRI THORACIC SPINE FINDINGS Alignment: Mild convex right scoliosis. No focal angulation or listhesis. Vertebrae: No acute or suspicious osseous findings. Cord:  The thoracic cord appears normal in signal and caliber. Paraspinal and other soft tissues: No significant paraspinal abnormalities. Disc levels: Very mild thoracic spondylosis with mild disc bulging and endplate osteophyte formation at T9-10, T10-11 and T11-12. No focal disc herniation, significant spinal stenosis or foraminal narrowing demonstrated. MRI LUMBAR SPINE FINDINGS Segmentation: Conventional anatomy assumed, with the last open disc space  designated L5-S1.Concordant with  prior imaging. Alignment: Stable mild convex left scoliosis and trace retrolisthesis at L2-3. Vertebrae: No worrisome osseous lesion, acute fracture or pars defect. Asymmetric facet arthropathy on the right at L3-4. Conus medullaris: Extends to the L1 level and appears normal. Paraspinal and other soft tissues: No acute paraspinal findings. Mild soft tissue edema around the right L3-4 facet joint. 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 This is suboptimally evaluated by this examination, although appears grossly similar to previous lumbar MRI and abdominal CTA. Disc levels: L1-2: Preserved disc height. Mild facet hypertrophy. No spinal stenosis or foraminal narrowing. L2-3: Mild loss of disc height with annular disc bulging and endplate osteophytes. Mild facet and ligamentous hypertrophy. Unchanged mild spinal stenosis and mild lateral recess narrowing on the right. No significant foraminal narrowing or nerve root impingement. L3-4: Mild loss of disc height with annular disc bulging, endplate osteophytes and moderate facet/ligamentous hypertrophy. Resulting moderate multifactorial spinal stenosis with asymmetric right lateral recess and right foraminal narrowing. Findings are similar to previous MRI. L4-5: Chronic loss of disc height with annular disc bulging, facet and ligamentous hypertrophy. Similar mild spinal stenosis with asymmetric left lateral recess narrowing. Mild foraminal narrowing bilaterally without L4 nerve root impingement. L5-S1: Mild chronic loss of disc height with annular disc bulging and endplate osteophytes asymmetric to the left. Bilateral facet hypertrophy. Stable mild left foraminal narrowing. The spinal canal and right foramen are patent. IMPRESSION: 1. Motion degraded examination, especially of the cervical spine. 2. Postsurgical changes from  recent C3-4 ACDF. No hardware displacement or acute fracture identified. 3. Diffuse effacement of the CSF surrounding the cord in the cervical spine, similar to prior studies. Possible mild T2 hyperintensity in the cord at C3, suboptimally evaluated due to motion. 4. Given the motion limitations of this examination, consider further evaluation with CT of the cervical spine. 5. No acute findings or significant changes in the thoracic or lumbar spine. 6. 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 7. 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 This is suboptimally evaluated by this examination, although appears grossly similar to previous lumbar MRI and abdominal CTA. See follow up recommendations from abdominal CTA 11/20/2023. Electronically Signed   By: Elsie Perone M.D.   On: 01/16/2024 13:31   MR LUMBAR SPINE WO CONTRAST Result Date: 01/16/2024 CLINICAL DATA:  Neck pain with worsening weakness in all 4 extremities. History of cervical decompression and fusion 01/08/2024. EXAM: MRI CERVICAL, THORACIC AND LUMBAR SPINE WITHOUT CONTRAST TECHNIQUE: Multiplanar and multiecho pulse sequences of the cervical spine, to include the craniocervical junction and cervicothoracic junction, and thoracic and lumbar spine, were obtained without intravenous contrast. COMPARISON:  Cervical MRI 01/12/2024 and 11/15/2023. Lumbar MRI 11/15/2023. Chest CTA 01/12/2024. Abdominopelvic CT 11/20/2023. FINDINGS: Technical note: Despite efforts by the technologist and patient, mild motion artifact is present on today's exam and could not be eliminated. This reduces exam sensitivity and specificity. MRI CERVICAL SPINE FINDINGS Alignment: The alignment is stable with straightening. No focal angulation or significant listhesis. Vertebrae: Postsurgical changes from recent C3-4 ACDF. No hardware displacement or acute fracture identified.  Heterogeneous marrow edema appears similar to prior studies, likely discogenic. Assessment limited by motion. No specific evidence of discitis or osteomyelitis. Cord: Diffuse effacement of the CSF surrounding the cord, similar to prior studies. Possible mild T2 hyperintensity in the cord at C3, suboptimally evaluated due  to motion. No epidural fluid collections are identified. Posterior Fossa, vertebral arteries, paraspinal tissues: Visualized portions of the posterior fossa appear unremarkable.Intracranial findings are dictated separately. Preserved vertebral artery flow voids. Similar prevertebral soft tissue swelling, likely related to recent surgery. Disc levels: Cervical disc space assessment limited by motion artifact. No gross change from recent prior postoperative study. There is multilevel spondylosis contributing to mild spinal stenosis and variable degrees of foraminal narrowing at multiple levels. As above, possible T2 hyperintensity in the cord at C3, suboptimally evaluated due to motion. MRI THORACIC SPINE FINDINGS Alignment: Mild convex right scoliosis. No focal angulation or listhesis. Vertebrae: No acute or suspicious osseous findings. Cord:  The thoracic cord appears normal in signal and caliber. Paraspinal and other soft tissues: No significant paraspinal abnormalities. Disc levels: Very mild thoracic spondylosis with mild disc bulging and endplate osteophyte formation at T9-10, T10-11 and T11-12. No focal disc herniation, significant spinal stenosis or foraminal narrowing demonstrated. MRI LUMBAR SPINE FINDINGS Segmentation: Conventional anatomy assumed, with the last open disc space designated L5-S1.Concordant with prior imaging. Alignment: Stable mild convex left scoliosis and trace retrolisthesis at L2-3. Vertebrae: No worrisome osseous lesion, acute fracture or pars defect. Asymmetric facet arthropathy on the right at L3-4. Conus medullaris: Extends to the L1 level and appears normal.  Paraspinal and other soft tissues: No acute paraspinal findings. Mild soft tissue edema around the right L3-4 facet joint. 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 This is suboptimally evaluated by this examination, although appears grossly similar to previous lumbar MRI and abdominal CTA. Disc levels: L1-2: Preserved disc height. Mild facet hypertrophy. No spinal stenosis or foraminal narrowing. L2-3: Mild loss of disc height with annular disc bulging and endplate osteophytes. Mild facet and ligamentous hypertrophy. Unchanged mild spinal stenosis and mild lateral recess narrowing on the right. No significant foraminal narrowing or nerve root impingement. L3-4: Mild loss of disc height with annular disc bulging, endplate osteophytes and moderate facet/ligamentous hypertrophy. Resulting moderate multifactorial spinal stenosis with asymmetric right lateral recess and right foraminal narrowing. Findings are similar to previous MRI. L4-5: Chronic loss of disc height with annular disc bulging, facet and ligamentous hypertrophy. Similar mild spinal stenosis with asymmetric left lateral recess narrowing. Mild foraminal narrowing bilaterally without L4 nerve root impingement. L5-S1: Mild chronic loss of disc height with annular disc bulging and endplate osteophytes asymmetric to the left. Bilateral facet hypertrophy. Stable mild left foraminal narrowing. The spinal canal and right foramen are patent. IMPRESSION: 1. Motion degraded examination, especially of the cervical spine. 2. Postsurgical changes from recent C3-4 ACDF. No hardware displacement or acute fracture identified. 3. Diffuse effacement of the CSF surrounding the cord in the cervical spine, similar to prior studies. Possible mild T2 hyperintensity in the cord at C3, suboptimally evaluated due to motion. 4. Given the motion limitations of this  examination, consider further evaluation with CT of the cervical spine. 5. No acute findings or significant changes in the thoracic or lumbar spine. 6. 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 7. 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 This is suboptimally evaluated by this examination, although appears grossly similar to previous lumbar MRI and abdominal CTA. See follow up recommendations from abdominal CTA 11/20/2023. Electronically Signed   By: Elsie Perone M.D.   On: 01/16/2024 13:31   DG Chest 1 View Result Date:  01/16/2024 CLINICAL DATA:  Shortness of breath EXAM: CHEST  1 VIEW COMPARISON:  01/12/2024 FINDINGS: Heart and mediastinal contours are within normal limits. No focal opacities or effusions. No acute bony abnormality. Old healed right lower rib fractures. No pneumothorax. IMPRESSION: No active cardiopulmonary disease. Electronically Signed   By: Franky Crease M.D.   On: 01/16/2024 01:59   DG Elbow Complete Left Result Date: 01/16/2024 CLINICAL DATA:  Pain EXAM: LEFT ELBOW - COMPLETE 3+ VIEW COMPARISON:  None Available. FINDINGS: No acute bony abnormality. Specifically, no fracture, subluxation, or dislocation. No joint effusion. IMPRESSION: No acute bony abnormality. Electronically Signed   By: Franky Crease M.D.   On: 01/16/2024 01:58   DG Shoulder Left Result Date: 01/16/2024 CLINICAL DATA:  Pain EXAM: LEFT SHOULDER - 2+ VIEW COMPARISON:  None Available. FINDINGS: Degenerative changes in the [facility] joint with joint space narrowing and spurring. Glenohumeral joint is maintained. No acute bony abnormality. Specifically, no fracture, subluxation, or dislocation. Soft tissues are intact. IMPRESSION: Degenerative changes in the left AC joint. No acute bony abnormality. Electronically Signed   By: Franky Crease M.D.   On: 01/16/2024 01:58        Scheduled Meds:  apixaban   5 mg Oral BID    aspirin  EC  81 mg Oral Daily   atorvastatin   40 mg Oral Daily   cloNIDine   0.1 mg Oral TID   lisinopril   20 mg Oral Daily   And   hydrochlorothiazide   12.5 mg Oral Daily   metoprolol  succinate  100 mg Oral Daily   pregabalin   50 mg Oral BID   Continuous Infusions:   LOS: 0 days     Devaughn KATHEE Ban, MD [facility]   If 7PM-7AM, please contact night-coverage [email or web address] Password [email or web address] 01/17/2024, 10:21 AM

## 2024-01-17 NOTE — TOC Initial Note (Signed)
 Transition of Care [facility]) - Initial/Assessment Note    Patient Details  Name: Scott [name] MRN: [id number] Date of Birth: Nov 30, 1960  Transition of Care [facility]) CM/SW Contact:    [place] L Jayleah Garbers, LCSW Phone Number: 01/17/2024, 3:25 PM  Clinical Narrative:                  CSW spoke with patient. Recommendations for SNF discussed. Patient is agreeable to going as long as he can be placed in a facility near [place]. Patient advised that he lives with his adult sibling. He acknowledges that he is not able to take care of himself properly. He reported that he has had issues with mobility.   FL2 completed and needs signature. Bed searches will be initiated in [facility].   Family contacts reviewed. Patient advised that his sister Magalene should be the first point of contact. His, son Cameo, resides in [place] and the time zone is 13 hours ahead. He can be contacted but it would be difficult. Last point of contact should be Karna Menghini.   DME discussed. Patient reported he has two RW in the home. He stated that his sister would end up in a facility too because she is in poor health as well.   [facility] Per chart review, patient was not picking up his prescribed medication because he was physically unable to.        Patient Goals and CMS Choice            Expected Discharge Plan and Services                                              Prior Living Arrangements/Services                       Activities of Daily Living   ADL Screening (condition at time of admission) Independently performs ADLs?: No Does the patient have a NEW difficulty with bathing/dressing/toileting/self-feeding that is expected to last >3 days?: Yes (Initiates electronic notice to provider for possible OT consult) Does the patient have a NEW difficulty with getting in/out of bed, walking, or climbing stairs that is expected to last >3 days?: Yes (Initiates  electronic notice to provider for possible PT consult) Does the patient have a NEW difficulty with communication that is expected to last >3 days?: No Is the patient deaf or have difficulty hearing?: Yes Does the patient have difficulty seeing, even when wearing glasses/contacts?: Yes Does the patient have difficulty concentrating, remembering, or making decisions?: No  Permission Sought/Granted                  Emotional Assessment              Admission diagnosis:  Decreased sensation [R20.8] Weakness [R53.1] COPD exacerbation (HCC) [J44.1] Left-sided sensory deficit present [R44.9] Generalized weakness [R53.1] H/O medication noncompliance [Z91.148] Fall, initial encounter [W19.XXXA] Back pain, unspecified back location, unspecified back pain laterality, unspecified chronicity [M54.9] Patient Active Problem List   Diagnosis Date Noted   Generalized weakness 01/16/2024   Acute pulmonary embolism (HCC) 01/13/2024   Pulmonary emboli (HCC) 01/13/2024   Cervical myelopathy (HCC) 01/08/2024   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   Degenerative spondylolisthesis of cervical vertebra 12/21/2023   Carpal tunnel syndrome on left 12/21/2023   Cephalalgia 11/11/2023  Chronic lower extremity pain (2ry area of Pain) (Left) 04/07/2023   Peripheral vascular disease 04/07/2023   Fall at home, initial encounter 04/07/2023   Hematoma of upper extremity (Left) 04/07/2023   Chronic anticoagulation (Plavix ) 04/07/2023   Current use of aspirin  04/07/2023   Pain and swelling of lower extremity (Left) 04/07/2023   Chronic neuropathic pain 04/06/2023   Pharmacologic therapy 04/06/2023   Problems influencing health status 04/06/2023   Left inguinal hernia 01/16/2023   Right inguinal hernia 01/16/2023   Lymphedema 11/26/2022   Chronic foot pain (1ry area of Pain) (Left) 06/14/2022   Suspected cerebrovascular accident 05/03/2022   Peripheral edema 03/19/2021   DVT  (deep venous thrombosis) (HCC) 01/09/2021   H/O deep venous thrombosis 01/09/2021   Atherosclerosis of artery of extremity with rest pain (HCC) 12/29/2020   Atherosclerosis of native arteries of extremity with rest pain (HCC) 12/26/2020   AAA (abdominal aortic aneurysm) without rupture 11/28/2020   Carotid stenosis 11/28/2020   Atherosclerosis of native arteries of extremity with intermittent claudication 02/01/2020   PAOD (peripheral arterial occlusive disease) 01/21/2020   Vitamin B12 deficiency 09/02/2018   Vitamin D deficiency 09/02/2018   Chest congestion 05/27/2018   Rib pain on right side 05/27/2018   High risk medication use 03/03/2018   Arthritis involving multiple sites 03/28/2017   Daily consumption of alcohol  03/28/2017   Dupuytren's contracture of both hands 03/28/2017   Family history of rheumatoid arthritis 03/28/2017   Intermittent chest pain 03/28/2017   Status post ablation of ventricular arrhythmia 03/28/2017   SVT (supraventricular tachycardia) 12/20/2015   Cigarette smoker 06/19/2015   Hypercholesterolemia 06/19/2015   Allergic rhinitis 12/03/2013   Anxiety 12/03/2013   CAD (coronary artery disease) 12/03/2013   COPD (chronic obstructive pulmonary disease) (HCC) 12/03/2013   Hypertension 12/03/2013   Alcohol  abuse 05/26/2009   Tobacco use disorder 05/26/2009   PCP:  Everlene Parris LABOR, MD Pharmacy:   [phone] [place], [place] - [address] [place] [address] Phone: [phone] Fax: [phone]  [id number] [place], [place] - [address] [place] [address] Phone: [phone] Fax: [phone]  [facility] - [place], [place] - [address] [place] [address] Phone: [phone] Fax: [phone]  [facility] [address] [place] [address] Phone: [phone] Fax: [phone]     Social Drivers of Health (SDOH) Social  History: SDOH Screenings   Food Insecurity: No Food Insecurity (01/16/2024)  Housing: Low Risk  (01/16/2024)  Transportation Needs: No Transportation Needs (01/16/2024)  Utilities: Not At Risk (01/16/2024)  Depression (PHQ2-9): Medium Risk (10/30/2023)  Financial Resource Strain: High Risk (10/07/2023)   Received from [facility]  Physical Activity: Inactive (10/07/2023)   [facility]  Social Connections: Socially Isolated (01/16/2024)  Stress: Stress Concern Present (10/07/2023)   Received from [facility]  Tobacco Use: High Risk (01/16/2024)  Health Literacy: Inadequate Health Literacy (10/07/2023)   Received from [facility]   SDOH Interventions:     Readmission Risk Interventions     No data to display

## 2024-01-17 NOTE — [place] FL2 (Signed)
 [place]  MEDICAID FL2 LEVEL OF CARE FORM     IDENTIFICATION  Patient Name: Scott [name] Birthdate: 27-Mar-1961 Sex: male Admission Date (Current Location): 01/16/2024  [facility] [place] Number:  Chiropodist and Address:  [facility], [address] [place], [place] [address]      Provider Number: [id number]  Attending Physician Name and Address:  Kandis Devaughn Sayres, MD  Relative Name and Phone Number:  Bennie Bihari [phone]; Dominion Kathan [phone]    Current Level of Care: Hospital Recommended Level of Care: Skilled Nursing Facility Prior Approval Number:    Date Approved/Denied: 01/17/24 PASRR Number: [id number]  Discharge Plan:      Current Diagnoses: Patient Active Problem List   Diagnosis Date Noted   Generalized weakness 01/16/2024   Acute pulmonary embolism (HCC) 01/13/2024   Pulmonary emboli (HCC) 01/13/2024   Cervical myelopathy (HCC) 01/08/2024   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   Degenerative spondylolisthesis of cervical vertebra 12/21/2023   Carpal tunnel syndrome on left 12/21/2023   Cephalalgia 11/11/2023   Chronic lower extremity pain (2ry area of Pain) (Left) 04/07/2023   Peripheral vascular disease 04/07/2023   Fall at home, initial encounter 04/07/2023   Hematoma of upper extremity (Left) 04/07/2023   Chronic anticoagulation (Plavix ) 04/07/2023   Current use of aspirin  04/07/2023   Pain and swelling of lower extremity (Left) 04/07/2023   Chronic neuropathic pain 04/06/2023   Pharmacologic therapy 04/06/2023   Problems influencing health status 04/06/2023   Left inguinal hernia 01/16/2023   Right inguinal hernia 01/16/2023   Lymphedema 11/26/2022   Chronic foot pain (1ry area of Pain) (Left) 06/14/2022   Suspected cerebrovascular accident 05/03/2022   Peripheral edema 03/19/2021   DVT (deep venous thrombosis) (HCC) 01/09/2021   H/O deep venous thrombosis 01/09/2021    Atherosclerosis of artery of extremity with rest pain (HCC) 12/29/2020   Atherosclerosis of native arteries of extremity with rest pain (HCC) 12/26/2020   AAA (abdominal aortic aneurysm) without rupture 11/28/2020   Carotid stenosis 11/28/2020   Atherosclerosis of native arteries of extremity with intermittent claudication 02/01/2020   PAOD (peripheral arterial occlusive disease) 01/21/2020   Vitamin B12 deficiency 09/02/2018   Vitamin D deficiency 09/02/2018   Chest congestion 05/27/2018   Rib pain on right side 05/27/2018   High risk medication use 03/03/2018   Arthritis involving multiple sites 03/28/2017   Daily consumption of alcohol  03/28/2017   Dupuytren's contracture of both hands 03/28/2017   Family history of rheumatoid arthritis 03/28/2017   Intermittent chest pain 03/28/2017   Status post ablation of ventricular arrhythmia 03/28/2017   SVT (supraventricular tachycardia) 12/20/2015   Cigarette smoker 06/19/2015   Hypercholesterolemia 06/19/2015   Allergic rhinitis 12/03/2013   Anxiety 12/03/2013   CAD (coronary artery disease) 12/03/2013   COPD (chronic obstructive pulmonary disease) (HCC) 12/03/2013   Hypertension 12/03/2013   Alcohol  abuse 05/26/2009   Tobacco use disorder 05/26/2009    Orientation RESPIRATION BLADDER Height & Weight     Self, Time, Situation, Place  Other (Comment) (Few scattered rhonchi) External catheter Weight: 156 lb 1.4 oz (70.8 kg) Height:  5' 10 (177.8 cm)  BEHAVIORAL SYMPTOMS/MOOD NEUROLOGICAL BOWEL NUTRITION STATUS      Continent Diet (Diet Heart Room service appropriate? Yes; Fluid consistency: Thin: Cardiac starting at 09/26 0627)  AMBULATORY STATUS COMMUNICATION OF NEEDS Skin   Limited Assist Verbally Surgical wounds, Other (Comment) (Bruising on arms, healing incision anterior neck c/d/i)  Personal Care Assistance Level of Assistance  Bathing, Feeding, Dressing Bathing Assistance: Limited  assistance Feeding assistance: Independent Dressing Assistance: Limited assistance     Functional Limitations Info  Sight, Hearing, Speech Sight Info: Impaired (Wears glasses) Hearing Info: Adequate Speech Info: Adequate    SPECIAL CARE FACTORS FREQUENCY  PT (By licensed PT), OT (By licensed OT)     PT Frequency: 2x OT Frequency: 2X            Contractures Contractures Info: Present    Additional Factors Info  Code Status, Allergies Code Status Info: FULL Allergies Info: Acetaminophen ; Chantix; Codeine; Gabapentin; Pregablin           Current Medications (01/17/2024):  This is the current hospital active medication list Current Facility-Administered Medications  Medication Dose Route Frequency Provider Last Rate Last Admin   apixaban  (ELIQUIS ) tablet 5 mg  5 mg Oral BID Paudel, Keshab, MD   5 mg at 01/17/24 1000   atorvastatin  (LIPITOR) tablet 40 mg  40 mg Oral Daily Paudel, Keshab, MD   40 mg at 01/17/24 1000   cloNIDine  (CATAPRES ) tablet 0.1 mg  0.1 mg Oral TID Duncan, Hazel V, MD   0.1 mg at 01/17/24 1000   [START ON 01/18/2024] fluticasone  furoate-vilanterol (BREO ELLIPTA ) 200-25 MCG/ACT 1 puff  1 puff Inhalation Daily Wouk, Devaughn Sayres, MD       folic acid  (FOLVITE ) tablet 1 mg  1 mg Oral Daily Wouk, Devaughn Sayres, MD   1 mg at 01/17/24 1303   lisinopril  (ZESTRIL ) tablet 20 mg  20 mg Oral Daily Perley Lum DEL, RPH   20 mg at 01/17/24 1000   And   hydrochlorothiazide  (HYDRODIURIL ) tablet 12.5 mg  12.5 mg Oral Daily Hunt, Madison H, RPH   12.5 mg at 01/17/24 1000   HYDROmorphone  (DILAUDID ) tablet 2 mg  2 mg Oral Q4H PRN Wouk, Devaughn Sayres, MD       ipratropium-albuterol  (DUONEB) 0.5-2.5 (3) MG/3ML nebulizer solution 3 mL  3 mL Nebulization Q4H PRN Tan, Lorelle Cummins, MD       LORazepam  (ATIVAN ) tablet 1-4 mg  1-4 mg Oral Q1H PRN Wouk, Devaughn Sayres, MD       Or   LORazepam  (ATIVAN ) injection 1-4 mg  1-4 mg Intravenous Q1H PRN Wouk, Devaughn Sayres, MD       metoprolol   succinate (TOPROL -XL) 24 hr tablet 100 mg  100 mg Oral Daily Paudel, Keshab, MD   100 mg at 01/17/24 1000   multivitamin with minerals tablet 1 tablet  1 tablet Oral Daily Kandis Devaughn Sayres, MD   1 tablet at 01/17/24 1303   nicotine  polacrilex (COMMIT) lozenge 4 mg  4 mg Oral PRN Paudel, Keshab, MD       pregabalin  (LYRICA ) capsule 50 mg  50 mg Oral BID Paudel, Keshab, MD   50 mg at 01/17/24 1000   senna (SENOKOT) tablet 8.6 mg  1 tablet Oral Daily Kandis Devaughn Sayres, MD   8.6 mg at 01/17/24 1303   thiamine  (VITAMIN B1) tablet 100 mg  100 mg Oral Daily Kandis Devaughn Sayres, MD   100 mg at 01/17/24 1303   Or   thiamine  (VITAMIN B1) injection 100 mg  100 mg Intravenous Daily Wouk, Devaughn Sayres, MD       tiZANidine  (ZANAFLEX ) tablet 2-4 mg  2-4 mg Oral Q8H PRN Paudel, Keshab, MD         Discharge Medications: Please see discharge summary for a list of discharge medications.  Relevant Imaging  Results:  Relevant Lab Results:   Additional Information [id number]  [place] L Corianne Buccellato, [place]

## 2024-01-17 NOTE — Evaluation (Addendum)
 Physical Therapy Evaluation Patient Details Name: Scott [name] MRN: [id number] DOB: 08-Jan-1961 Today's Date: 01/17/2024  History of Present Illness  Pt is a 63 y.o. male presented to ED at [facility] with worsening weakness to his bilateral upper and lower extremities. He is s/p recent ACDF on 01/08/2024 for cervical spinal stenosis at C3-C4 level, recent admission after 4 days of that surgery after a fall with neuro follow up with no further intervention recommended. PMH of chronic foot pain, arthritis, COPD, DVT/PE on Eliquis , CAD, neck pain, HTN, PVD, AAA, GERD, anxiety, depression, etoh abuse.  Clinical Impression  Patient seen for initial PT evaluation due to decline in functional mobility. Patient is A&O x 4. Baseline mobility reported as modI with occasional need for assistance, currently requiring minA for transfers with additional need for verbal cues and totalA for hand placement on bed rails. Gait assessed with RW requiring minA able to walk 30 feet for room ambulation with LOB resulting in needling wall in room for support, limited by LUE weakness/pain and limited ability to manipulate RW, neck pain and bilateral foot pain due to plantar warts. Clinical impression: patient presents with moderate mobility limitations secondary to post surgical pain/weakness and significant balance deficits. Recommend continued acute level skilled PT to address safety, mobility, and discharge planning. Pt recommended to d/c to SNF.       If plan is discharge home, recommend the following: A lot of help with walking and/or transfers;A lot of help with bathing/dressing/bathroom;Assistance with cooking/housework;Assist for transportation   Can travel by private vehicle   Yes    Equipment Recommendations None recommended by PT  Recommendations for Other Services       Functional Status Assessment Patient has had a recent decline in their functional status and demonstrates the ability to make significant  improvements in function in a reasonable and predictable amount of time.     Precautions / Restrictions Precautions Precautions: Cervical;Fall Precaution Booklet Issued: Yes (comment) Recall of Precautions/Restrictions: Impaired Restrictions Weight Bearing Restrictions Per Provider Order: No      Mobility  Bed Mobility Overal bed mobility: Needs Assistance Bed Mobility: Sidelying to Sit, Rolling Rolling: Contact guard assist, Used rails, Min assist Sidelying to sit: Min assist, Used rails            Transfers Overall transfer level: Needs assistance Equipment used: Rolling walker (2 wheels) Transfers: Sit to/from Stand Sit to Stand: Min assist (needs assistance to place L hand on RW handle; lifted bed 6 min. from lowest position)                Ambulation/Gait Ambulation/Gait assistance: Min assist Gait Distance (Feet): 30 Feet Assistive device: Rolling walker (2 wheels) Gait Pattern/deviations: Step-through pattern, Wide base of support Gait velocity: decreased     General Gait Details: LOB used door to lean on inside room, decreased foot clearance bilaterally, difficulty maintaining grip on RW; very unsteady; limited ability to ambulate due to plantar warts  Stairs            Wheelchair Mobility     Tilt Bed    Modified Rankin (Stroke Patients Only)       Balance Overall balance assessment: Needs assistance Sitting-balance support: Feet supported Sitting balance-Leahy Scale: Good     Standing balance support: Bilateral upper extremity supported, Reliant on assistive device for balance Standing balance-Leahy Scale: Poor Standing balance comment: RW and Min A  Pertinent Vitals/Pain Pain Assessment Pain Assessment: No/denies pain Faces Pain Scale: Hurts little more Pain Location: neck/hands; bilateral plantar warts Pain Descriptors / Indicators: Discomfort Pain Intervention(s): Monitored during  session, Repositioned    Home Living Family/patient expects to be discharged to:: Private residence Living Arrangements: Other relatives Available Help at Discharge: Friend(s);Available PRN/intermittently Type of Home: Apartment Home Access: Level entry       Home Layout: One level Home Equipment: Rollator (4 wheels);Wheelchair - manual      Prior Function Prior Level of Function : Needs assist;History of Falls (last six months);Independent/Modified Independent             Mobility Comments: Pt states that he typically uses RW for mobility, has not been able to recently due to LUE weakness and hand pain.       Extremity/Trunk Assessment   Upper Extremity Assessment Upper Extremity Assessment: LUE deficits/detail LUE Deficits / Details: limited ability to manipulate RW for ambulation    Lower Extremity Assessment Lower Extremity Assessment: Generalized weakness    Cervical / Trunk Assessment Cervical / Trunk Assessment: Neck Surgery  Communication   Communication Communication: No apparent difficulties    Cognition Arousal: Alert Behavior During Therapy: WFL for tasks assessed/performed, Restless                             Following commands: Intact       Cueing Cueing Techniques: Verbal cues, Visual cues     General Comments      Exercises     Assessment/Plan    PT Assessment Patient needs continued PT services  PT Problem List Decreased strength;Decreased activity tolerance;Decreased balance;Decreased mobility;Decreased knowledge of use of DME;Decreased safety awareness;Decreased knowledge of precautions;Pain       PT Treatment Interventions DME instruction;Gait training;Functional mobility training;Therapeutic activities;Therapeutic exercise;Balance training;Neuromuscular re-education;Patient/family education    PT Goals (Current goals can be found in the Care Plan section)  Acute Rehab PT Goals Patient Stated Goal: to go home PT  Goal Formulation: With patient Time For Goal Achievement: 01/22/24 Potential to Achieve Goals: Fair    Frequency Min 2X/week     Co-evaluation               AM-PAC PT 6 Clicks Mobility  Outcome Measure Help needed turning from your back to your side while in a flat bed without using bedrails?: None Help needed moving from lying on your back to sitting on the side of a flat bed without using bedrails?: A Little Help needed moving to and from a bed to a chair (including a wheelchair)?: A Little Help needed standing up from a chair using your arms (e.g., wheelchair or bedside chair)?: A Little Help needed to walk in hospital room?: A Little Help needed climbing 3-5 steps with a railing? : Total 6 Click Score: 17    End of Session Equipment Utilized During Treatment: Gait belt Activity Tolerance: Patient tolerated treatment well;Patient limited by pain Patient left: in chair;with call bell/phone within reach;with nursing/sitter in room;with family/visitor present Nurse Communication: Mobility status PT Visit Diagnosis: Unsteadiness on feet (R26.81);Other abnormalities of gait and mobility (R26.89);History of falling (Z91.81);Difficulty in walking, not elsewhere classified (R26.2);Pain Pain - part of body:  (R/L foot, LUE, cervical spine)    Time: 8644-8590 PT Time Calculation (min) (ACUTE ONLY): 14 min   Charges:   PT Evaluation $PT Eval Low Complexity: 1 Low   PT General Charges $$ ACUTE PT VISIT: 1  Visit         Sherlean Lesches DPT, PT    Sherlean DELENA Lesches 01/17/2024, 2:26 PM

## 2024-01-17 NOTE — Consult Note (Signed)
 Referring Physician:  No referring provider defined for this encounter.  Primary Physician:  Zafirov, Clarissa A, MD  Chief Complaint:  weakness  History of Present Illness: Scott [name] is a 63 y.o. male who presents with the chief complaint of upper and lower extremity weakness.  Underwent C34 ACDF anterior cervical discectomy and fusion 01/08/2024 for severe cervical myelopathy and weakness.  Due to his continued smoking and refusal to quit, was not a good surgical candidate for more extensive surgery.  Patient feels that his arms, especially left side are weaker than before surgery and so are his legs.     The symptoms are causing a significant impact on the patient's life.   Review of Systems:  A 10 point review of systems is negative, except for the pertinent positives and negatives detailed in the HPI.  Past Medical History: Past Medical History:  Diagnosis Date   AAA (abdominal aortic aneurysm) without rupture 02/23/2020   a.) AAA duplex 02/23/2020: 3.8 cm; b.) AAA duplex 11/28/2020: 3.7 cm; c.) CTA AO + BIFEM 12/18/2020: 3.8 x 3.4 cm; d.) AAA duplex 11/07/2021: 3.7 cm; e.) CTA AO + BIFEM 11/16/2021: 4.2 cm; f.) AAA duplex 05/28/2022: 4.0 cm; g.) AAA duplex 11/26/2022: 4.4 cm; h.) PET CT 10/01/2022: 4.3 cm; i.) AAA duplex 08/06/2023: 4.8 cm; j.) CTA abd/pel 11/20/2023: 4.8 x 4.6 cm   Alcoholism (HCC)    Anginal pain    Anxiety    Aortic atherosclerosis    Aortic root dilatation    Arthritis    Bilateral carotid artery disease 06/20/2020   a.) carotid doppler 06/20/2020: 1-39% RICA, 40-59% LICA; b.) carotid doppler 11/28/2020, 11/26/2022: 1-39% BICA   Bilateral inguinal hernia    CAD (coronary artery disease) 06/03/2017   a.) LHC 06/03/2017: 25% pLAD - med mgmt; b.) LHC 08/22/2021: 25% pLAD, 15% o-mLM, 25% dLM-pLAD, 25% o-mLCx, 25% oD2 - med mgmt   Carpal tunnel syndrome    Cigarette smoker    COPD (chronic obstructive pulmonary disease) (HCC)    COVID-19 2022    Deep vein thrombosis (DVT) of left lower extremity (HCC)    Depression    Diastolic dysfunction    Diverticulosis    Dupuytren contracture of both hands    Dyspnea    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    a.) small area of chronic encephalomalacia in the LEFT inferior temporal gyrus   GERD (gastroesophageal reflux disease)    Hepatic steatosis    HLD (hyperlipidemia)    Hypertension    Incomplete left bundle branch block (LBBB)    Lung nodules    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   a.) PET CT 10/01/2022: 6 mm with SUV max of 6.4; b.) s.p FNA 11/05/2022 --> pathology negative for malignancy (DDx oncocytosis vs oncocytoma)   PAOD (peripheral arterial occlusive disease)    a.) s/p BILATERAL femoral endarterectomies 12/29/2020; b.) s/p aorto-bifemoral bypass   Pre-diabetes    Stenosis of cervical spine with myelopathy (HCC)    SVT (supraventricular tachycardia)    a.) s/p ablation 02/28/2016   Vitamin B12 deficiency    Vitamin D deficiency     Past Surgical History: Past Surgical History:  Procedure Laterality Date   ANKLE SURGERY     bracelet   ANTERIOR CERVICAL DECOMP/DISCECTOMY FUSION N/A 01/08/2024   Procedure: ANTERIOR CERVICAL DECOMPRESSION/DISCECTOMY FUSION 1 LEVEL;  Surgeon: Claudene Penne ORN, MD;  Location: [facility];  Service: Neurosurgery;  Laterality: N/A;  C3-4 ANTERIOR CERVICAL DISCECTOMY AND FUSION   AORTO-FEMORAL  BYPASS GRAFT     CHOLECYSTECTOMY  2000   COLONOSCOPY     ENDARTERECTOMY FEMORAL Bilateral 12/29/2020   Procedure: ENDARTERECTOMY FEMORAL;  Surgeon: Marea Selinda RAMAN, MD;  Location: [facility];  Service: Vascular;  Laterality: Bilateral;  Left SFA and Tibial intervention   INSERTION OF MESH Bilateral 02/12/2023   Procedure: INSERTION OF MESH;  Surgeon: Rodolph Romano, MD;  Location: [facility];  Service: General;  Laterality: Bilateral;   LEFT HEART CATH AND CORONARY ANGIOGRAPHY Left 06/03/2017   Procedure: LEFT HEART CATH AND CORONARY  ANGIOGRAPHY;  Surgeon: Florencio Cara BIRCH, MD;  Location: [facility];  Service: Cardiovascular;  Laterality: Left;   LEFT HEART CATH AND CORONARY ANGIOGRAPHY N/A 08/22/2021   Procedure: LEFT HEART CATH AND CORONARY ANGIOGRAPHY;  Surgeon: Florencio Cara BIRCH, MD;  Location: [facility];  Service: Cardiovascular;  Laterality: N/A;   LOWER EXTREMITY ANGIOGRAPHY Left 02/03/2020   Procedure: LOWER EXTREMITY ANGIOGRAPHY;  Surgeon: Marea Selinda RAMAN, MD;  Location: [facility];  Service: Cardiovascular;  Laterality: Left;   SVT ABLATION N/A 02/28/2016   TONSILLECTOMY      Problem List: Patient Active Problem List   Diagnosis Date Noted   Generalized weakness 01/16/2024   Acute pulmonary embolism (HCC) 01/13/2024   Pulmonary emboli (HCC) 01/13/2024   Cervical myelopathy (HCC) 01/08/2024   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   Degenerative spondylolisthesis of cervical vertebra 12/21/2023   Carpal tunnel syndrome on left 12/21/2023   Cephalalgia 11/11/2023   Chronic lower extremity pain (2ry area of Pain) (Left) 04/07/2023   Peripheral vascular disease 04/07/2023   Fall at home, initial encounter 04/07/2023   Hematoma of upper extremity (Left) 04/07/2023   Chronic anticoagulation (Plavix ) 04/07/2023   Current use of aspirin  04/07/2023   Pain and swelling of lower extremity (Left) 04/07/2023   Chronic neuropathic pain 04/06/2023   Pharmacologic therapy 04/06/2023   Problems influencing health status 04/06/2023   Left inguinal hernia 01/16/2023   Right inguinal hernia 01/16/2023   Lymphedema 11/26/2022   Chronic foot pain (1ry area of Pain) (Left) 06/14/2022   Suspected cerebrovascular accident 05/03/2022   Peripheral edema 03/19/2021   DVT (deep venous thrombosis) (HCC) 01/09/2021   H/O deep venous thrombosis 01/09/2021   Atherosclerosis of artery of extremity with rest pain (HCC) 12/29/2020   Atherosclerosis of native arteries of extremity  with rest pain (HCC) 12/26/2020   AAA (abdominal aortic aneurysm) without rupture 11/28/2020   Carotid stenosis 11/28/2020   Atherosclerosis of native arteries of extremity with intermittent claudication 02/01/2020   PAOD (peripheral arterial occlusive disease) 01/21/2020   Vitamin B12 deficiency 09/02/2018   Vitamin D deficiency 09/02/2018   Chest congestion 05/27/2018   Rib pain on right side 05/27/2018   High risk medication use 03/03/2018   Arthritis involving multiple sites 03/28/2017   Daily consumption of alcohol  03/28/2017   Dupuytren's contracture of both hands 03/28/2017   Family history of rheumatoid arthritis 03/28/2017   Intermittent chest pain 03/28/2017   Status post ablation of ventricular arrhythmia 03/28/2017   SVT (supraventricular tachycardia) 12/20/2015   Cigarette smoker 06/19/2015   Hypercholesterolemia 06/19/2015   Allergic rhinitis 12/03/2013   Anxiety 12/03/2013   CAD (coronary artery disease) 12/03/2013   COPD (chronic obstructive pulmonary disease) (HCC) 12/03/2013   Hypertension 12/03/2013   Alcohol  abuse 05/26/2009   Tobacco use disorder 05/26/2009    Allergies: Allergies as of 01/15/2024 - Review Complete 01/15/2024  Allergen Reaction Noted   Acetaminophen -codeine Other (  See Comments) 08/09/2013   Chantix [varenicline] Nausea Only 02/20/2016   Codeine Nausea Only 12/07/2014   Gabapentin Swelling 11/01/2022   Pregabalin  Swelling 11/01/2022    Medications: @ENCMED @  Social History: Social History   Tobacco Use   Smoking status: Every Day    Current packs/day: 0.75    Types: Cigarettes   Smokeless tobacco: Never  Vaping Use   Vaping status: Never Used  Substance Use Topics   Alcohol  use: Yes    Comment: 6-8 beers per day   Drug use: No    Family Medical History: Family History  Problem Relation Age of Onset   Diabetes Mother    Hypertension Mother    Lupus Mother    Heart attack Father    Cancer Brother    Cancer Brother      Physical Examination: @VITALWITHPAIN @  General: Patient is well developed, well nourished, calm, collected, and in no apparent distress.  Psychiatric: Patient is non-anxious.  Head:  Pupils equal, round, and reactive to light.  ENT:  Oral mucosa appears well hydrated.  Neck:   Supple.  Full range of motion.  Respiratory: Patient is breathing without any difficulty.  Extremities: No edema.  Vascular: Palpable pulses.  Skin:   On exposed skin, there are no abnormal skin lesions.  NEUROLOGICAL:  General: In no acute distress.   Awake, alert, oriented to person, place, and time.  Pupils equal round and reactive to light.  Facial tone is symmetric.  Tongue protrusion is midline.  There is no pronator drift.    Strength: Side Biceps Triceps Deltoid Interossei Grip Wrist Ext. Wrist Flex.  R 5 4 4 4 3 3 3   L 3 2 2 2 2 2 2    Side Iliopsoas Quads Hamstring PF DF EHL  R 5 5 5 5 5 5   L 4 4 4 4 4 4   Imaging:  Cervical disc space assessment limited by motion artifact. No gross change from recent prior postoperative study. There is multilevel spondylosis contributing to mild spinal stenosis and variable degrees of foraminal narrowing at multiple levels. As above, possible T2 hyperintensity in the cord at C3, suboptimally evaluated due to motion.  I have personally reviewed the images and agree with the above interpretation.  Assessment and Plan: Scott [name] is a pleasant 63 y.o. male with severe cervical myelopathy.  Due to his refusal to quit surgery, would not be a good candidate for multi-level surgery  I have discussed the condition with the patient, including showing the radiographs and discussing treatment options in layman's terms.  The patient may benefit from conservative management.  Thus, I have recommended the following:  1)  No urgent surgical indications. 2)  PT/OT and likely rehab placement.      Thank you for involving me in the care of this patient. I  will keep you apprised of the patient's progress.   This note was partially dictated using voice recognition software, so please excuse any errors that were not corrected.    Deatrice EMERSON Figures MD, PhD, REENA

## 2024-01-18 DIAGNOSIS — R531 Weakness: Secondary | ICD-10-CM | POA: Diagnosis not present

## 2024-01-18 LAB — BASIC METABOLIC PANEL WITH GFR
Anion gap: 10 (ref 5–15)
BUN: 13 mg/dL (ref 8–23)
CO2: 27 mmol/L (ref 22–32)
Calcium: 8.9 mg/dL (ref 8.9–10.3)
Chloride: 91 mmol/L — ABNORMAL LOW (ref 98–111)
Creatinine, Ser: 0.45 mg/dL — ABNORMAL LOW (ref 0.61–1.24)
GFR, Estimated: >60 mL/min (ref >60)
Glucose, Bld: 94 mg/dL (ref 70–99)
Potassium: 3.2 mmol/L — ABNORMAL LOW (ref 3.5–5.1)
Sodium: 128 mmol/L — ABNORMAL LOW (ref 135–145)

## 2024-01-18 LAB — MAGNESIUM: Magnesium: 1.8 mg/dL (ref 1.7–2.4)

## 2024-01-18 MED ORDER — POLYETHYLENE GLYCOL 3350 17 G PO PACK
17.0000 g | PACK | Freq: Every day | ORAL | Status: DC
  Filled 2024-01-18 (×4): qty 1

## 2024-01-18 MED ORDER — MAGNESIUM OXIDE -MG SUPPLEMENT 400 (240 MG) MG PO TABS
800.0000 mg | ORAL_TABLET | Freq: Every day | ORAL | Status: DC
  Administered 2024-01-18 – 2024-01-23 (×6): 800 mg via ORAL
  Filled 2024-01-18 (×6): qty 2

## 2024-01-18 NOTE — Plan of Care (Signed)

## 2024-01-18 NOTE — Progress Notes (Addendum)
 0800 Patient insist that nurse tell PT to come work with him today. Stiffness and limited mobility noted in bilateral arms,shoulders and hands with ROM. Explained that I (the nurse) did not make the PT schedule. Hopefully PT would work with him today.  1019 CIWA score requires prn for anxiety. 1200 PT put patient in chair. Patient very happy. Eating lunch and happy. 1400 Back to bed with NT.  1600 Talkative but getting anxious. Given prn from CIWA orders. 1830 Eating dinner with minimal amount of help.

## 2024-01-18 NOTE — Progress Notes (Signed)
 PROGRESS NOTE    TURNER BAILLIE  FMW:[id number] DOB: June 15, 1960 DOA: 01/16/2024 PCP: Everlene Parris LABOR, MD  Outpatient Specialists: neurosurgery    Brief Narrative:   From admission h and p  Scott [name] is a pleasant 63 y.o. male with medical history significant for chronic foot pain, arthritis, COPD, DVT/PE on Eliquis , CAD, neck pain s/p recent ACDF on 01/08/2024 for cervical spinal stenosis at C3-C4 level, recent admission after 4 days of that surgery after a fall, presented to ED at [facility] today with worsening weakness to his bilateral upper and lower extremities.  This has been ongoing for the past week but was worsened over time.  Patient was here on 9/22 for similar symptoms and he was eval by neurosurgery and no further intervention was needed at that time. Since patient was discharged home he continued to have worsening symptoms, he has not been able to pick up any of his medications including his Lyrica  or Eliquis .  Lives at home alone and has been having more difficulty using his walker to ambulate.  He stated that he slipped out of his chair onto the floor and has lower back pain now.  He stated that he did not hit his head, does not have incontinence or saddle anesthesia. EMS note: Patient was wheezing gave him 1 DuoNeb and route.  Patient was complaining of pain to his bilateral upper and lower extremity as well as back and neck and left foot.  He does have chronic pain and foot pain.  Denies any chest pain, shortness of breath, fever, palpitations, cough or urinary symptoms.  Assessment & Plan:   Principal Problem:   Generalized weakness Active Problems:   Fall at home, initial encounter   Hematoma of upper extremity (Left)   CAD (coronary artery disease)   COPD (chronic obstructive pulmonary disease) (HCC)   Hypercholesterolemia   Chronic foot pain (1ry area of Pain) (Left)   Alcohol  abuse   Pulmonary emboli (HCC)  # Cervical myelopathy S/p recent ACDF. MRI doesn't  appear to show any acute changes. Neurosurg has seen, advises no additional surgery - PT advising SNF, patient agreeable, TOC consulted - home prn dialudid  # PE, acute # History dvt Small recent PE, no hypoxia or signs right heart strain - continue apixaban   # Alcohol  abuse Admits to 4 drinks a day. No s/s withdrawal currently - vitamins - ciwa  # HTN Bp normalized - home acei - hold home hydrochlorothiazide  as below  # Hyponatremia Chronic. Home hydrochlorothiazide  may contribute. As may alcohol  abuse - hold hydrochlorothiazide    # COPD Stable - breo for home symbicort  # PAD - have discontinued home asa given recent start of apixaban    DVT prophylaxis: apixaban  Code Status: full Family Communication: sister updated telephonically 9/28, shared decision to give periodic updates unless status changes  Level of care: Med-Surg Status is: Observation    Consultants:  neurosurgery  Procedures: none  Antimicrobials:  none    Subjective: Reports feeling fine, pain controlled. No bm yet, had one  Objective: Vitals:   01/17/24 1956 01/18/24 0449 01/18/24 1018 01/18/24 1032  BP: (!) 147/105 (!) 130/90 124/84 124/85  Pulse: 67 68  85  Resp:      Temp: 98.1 F (36.7 C) 97.7 F (36.5 C)    TempSrc:      SpO2: 94% 98%    Weight:      Height:        Intake/Output Summary (Last 24 hours) at 01/18/2024  1243 Last data filed at 01/18/2024 0900 Gross per 24 hour  Intake 780 ml  Output 1000 ml  Net -220 ml   Filed Weights   01/16/24 0722  Weight: 70.8 kg    Examination:  General exam: Appears calm and comfortable  Respiratory system: few scattered rhonchi, normal wob Cardiovascular system: S1 & S2 heard, RRR.   Gastrointestinal system: Abdomen is nondistended, soft and nontender.  Central nervous system: Alert and oriented. Decreased strength 2/2 pain upper and lower extremities Extremities: Symmetric 5 x 5 power. Skin: bruising on arms, healing  incision anterior neck c/d/i Psychiatry: calm    Data Reviewed: I have personally reviewed following labs and imaging studies  CBC: Recent Labs  Lab 01/12/24 1827 01/15/24 2345  WBC 8.5 8.5  HGB 14.6 14.3  HCT 40.8 38.7*  MCV 96.7 94.6  PLT 226 242   Basic Metabolic Panel: Recent Labs  Lab 01/12/24 1827 01/15/24 2345 01/17/24 0950 01/18/24 0402  NA 129* 127* 131* 128*  K 3.8 3.3* 3.2* 3.2*  CL 91* 86* 91* 91*  CO2 24 25 28 27   GLUCOSE 93 90 186* 94  BUN 10 9 9 13   CREATININE 0.52* 0.56* 0.47* 0.45*  CALCIUM  8.9 8.8* 9.0 8.9   GFR: Estimated Creatinine Clearance: 94.6 mL/min (A) (by C-G formula based on SCr of 0.45 mg/dL (L)). Liver Function Tests: Recent Labs  Lab 01/15/24 2345  AST 36  ALT 24  ALKPHOS 69  BILITOT 0.7  PROT 6.8  ALBUMIN  3.5   No results for input(s): LIPASE, AMYLASE in the last 168 hours. No results for input(s): AMMONIA in the last 168 hours. Coagulation Profile: Recent Labs  Lab 01/13/24 0022  INR 1.1   Cardiac Enzymes: No results for input(s): CKTOTAL, CKMB, CKMBINDEX, TROPONINI in the last 168 hours. BNP (last 3 results) No results for input(s): PROBNP in the last 8760 hours. HbA1C: No results for input(s): HGBA1C in the last 72 hours. CBG: No results for input(s): GLUCAP in the last 168 hours. Lipid Profile: No results for input(s): CHOL, HDL, LDLCALC, TRIG, CHOLHDL, LDLDIRECT in the last 72 hours. Thyroid  Function Tests: No results for input(s): TSH, T4TOTAL, FREET4, T3FREE, THYROIDAB in the last 72 hours. Anemia Panel: No results for input(s): VITAMINB12, FOLATE, FERRITIN, TIBC, IRON, RETICCTPCT in the last 72 hours. Urine analysis:    Component Value Date/Time   COLORURINE YELLOW (A) 01/16/2024 0720   APPEARANCEUR CLEAR (A) 01/16/2024 0720   LABSPEC 1.004 (L) 01/16/2024 0720   PHURINE 6.0 01/16/2024 0720   GLUCOSEU NEGATIVE 01/16/2024 0720   HGBUR NEGATIVE  01/16/2024 0720   BILIRUBINUR NEGATIVE 01/16/2024 0720   KETONESUR 5 (A) 01/16/2024 0720   PROTEINUR NEGATIVE 01/16/2024 0720   NITRITE NEGATIVE 01/16/2024 0720   LEUKOCYTESUR NEGATIVE 01/16/2024 0720   Sepsis Labs: @LABRCNTIP (procalcitonin:4,lacticidven:4)  ) Recent Results (from the past 240 hours)  Resp panel by RT-PCR (RSV, Flu A&B, Covid) Anterior Nasal Swab     Status: None   Collection Time: 01/16/24 10:10 AM   Specimen: Anterior Nasal Swab  Result Value Ref Range Status   SARS Coronavirus 2 by RT PCR NEGATIVE NEGATIVE Final    Comment: (NOTE) SARS-CoV-2 target nucleic acids are NOT DETECTED.  The SARS-CoV-2 RNA is generally detectable in upper respiratory specimens during the acute phase of infection. The lowest concentration of SARS-CoV-2 viral copies this assay can detect is 138 copies/mL. A negative result does not preclude SARS-Cov-2 infection and should not be used as the sole basis for treatment  or other patient management decisions. A negative result may occur with  improper specimen collection/handling, submission of specimen other than nasopharyngeal swab, presence of viral mutation(s) within the areas targeted by this assay, and inadequate number of viral copies(<138 copies/mL). A negative result must be combined with clinical observations, patient history, and epidemiological information. The expected result is Negative.  Fact Sheet for Patients:  BloggerCourse.com  Fact Sheet for Healthcare Providers:  SeriousBroker.it  This test is no t yet approved or cleared by the [place]  FDA and  has been authorized for detection and/or diagnosis of SARS-CoV-2 by FDA under an Emergency Use Authorization (EUA). This EUA will remain  in effect (meaning this test can be used) for the duration of the COVID-19 declaration under Section 564(b)(1) of the Act, 21 U.S.C.section 360bbb-3(b)(1), unless the authorization  is terminated  or revoked sooner.       Influenza A by PCR NEGATIVE NEGATIVE Final   Influenza B by PCR NEGATIVE NEGATIVE Final    Comment: (NOTE) The Xpert Xpress SARS-CoV-2/FLU/RSV plus assay is intended as an aid in the diagnosis of influenza from Nasopharyngeal swab specimens and should not be used as a sole basis for treatment. Nasal washings and aspirates are unacceptable for Xpert Xpress SARS-CoV-2/FLU/RSV testing.  Fact Sheet for Patients: BloggerCourse.com  Fact Sheet for Healthcare Providers: SeriousBroker.it  This test is not yet approved or cleared by the [place]  FDA and has been authorized for detection and/or diagnosis of SARS-CoV-2 by FDA under an Emergency Use Authorization (EUA). This EUA will remain in effect (meaning this test can be used) for the duration of the COVID-19 declaration under Section 564(b)(1) of the Act, 21 U.S.C. section 360bbb-3(b)(1), unless the authorization is terminated or revoked.     Resp Syncytial Virus by PCR NEGATIVE NEGATIVE Final    Comment: (NOTE) Fact Sheet for Patients: BloggerCourse.com  Fact Sheet for Healthcare Providers: SeriousBroker.it  This test is not yet approved or cleared by the [place]  FDA and has been authorized for detection and/or diagnosis of SARS-CoV-2 by FDA under an Emergency Use Authorization (EUA). This EUA will remain in effect (meaning this test can be used) for the duration of the COVID-19 declaration under Section 564(b)(1) of the Act, 21 U.S.C. section 360bbb-3(b)(1), unless the authorization is terminated or revoked.  Performed at [facility], [address] [place], [place] [address]          Radiology Studies: MR BRAIN WO CONTRAST Result Date: 01/16/2024 CLINICAL DATA:  Provided history: Neuro deficit, acute, stroke suspected. Additional history provided:  Worsening upper and lower extremity weakness. EXAM: MRI HEAD WITHOUT CONTRAST TECHNIQUE: Multiplanar, multiecho pulse sequences of the brain and surrounding structures were obtained without intravenous contrast. COMPARISON:  Brain MRI 06/11/2022. FINDINGS: Brain: No age-advanced or lobar predominant cerebral atrophy. Redemonstrated focus of chronic encephalomalacia/gliosis within the inferolateral left temporal lobe. Mild multifocal T2 FLAIR hyperintense signal abnormality elsewhere within the cerebral white matter, nonspecific but compatible with chronic small vessel ischemic disease. Several nonspecific chronic microhemorrhages scattered within the supratentorial brain, progressed in number. There is no acute infarct. No evidence of an intracranial mass. No extra-axial fluid collection. No midline shift. Vascular: Maintained flow voids within the proximal large arterial vessels. Skull and upper cervical spine: No focal worrisome calvarial marrow lesion. Cervical spine findings separately reported on same day cervical spine MRI. Sinuses/Orbits: No mass or acute finding within the imaged orbits. Mucous retention cysts within the right maxillary sinus measuring up to 14 mm. Trace mucosal  thickening within the left maxillary sinus. Mild mucosal thickening within the right sphenoid sinus. Other: Redemonstrated dermal cysts within the left face and right occipital region. IMPRESSION: 1.  No evidence of an acute intracranial abnormality. 2. Unchanged focus of chronic encephalomalacia/gliosis within the inferolateral left temporal lobe, nonspecific but likely post-traumatic in this location. 3. Mild chronic small vessel ischemic changes within the cerebral white matter, similar to the prior MRI of 06/11/2022. 4. Several nonspecific chronic microhemorrhages within the supratentorial brain, increased in number. 5. Paranasal sinus disease as described. Electronically Signed   By: Rockey Childs D.O.   On: 01/16/2024 13:58    MR Cervical Spine Wo Contrast Result Date: 01/16/2024 CLINICAL DATA:  Neck pain with worsening weakness in all 4 extremities. History of cervical decompression and fusion 01/08/2024. EXAM: MRI CERVICAL, THORACIC AND LUMBAR SPINE WITHOUT CONTRAST TECHNIQUE: Multiplanar and multiecho pulse sequences of the cervical spine, to include the craniocervical junction and cervicothoracic junction, and thoracic and lumbar spine, were obtained without intravenous contrast. COMPARISON:  Cervical MRI 01/12/2024 and 11/15/2023. Lumbar MRI 11/15/2023. Chest CTA 01/12/2024. Abdominopelvic CT 11/20/2023. FINDINGS: Technical note: Despite efforts by the technologist and patient, mild motion artifact is present on today's exam and could not be eliminated. This reduces exam sensitivity and specificity. MRI CERVICAL SPINE FINDINGS Alignment: The alignment is stable with straightening. No focal angulation or significant listhesis. Vertebrae: Postsurgical changes from recent C3-4 ACDF. No hardware displacement or acute fracture identified. Heterogeneous marrow edema appears similar to prior studies, likely discogenic. Assessment limited by motion. No specific evidence of discitis or osteomyelitis. Cord: Diffuse effacement of the CSF surrounding the cord, similar to prior studies. Possible mild T2 hyperintensity in the cord at C3, suboptimally evaluated due to motion. No epidural fluid collections are identified. Posterior Fossa, vertebral arteries, paraspinal tissues: Visualized portions of the posterior fossa appear unremarkable.Intracranial findings are dictated separately. Preserved vertebral artery flow voids. Similar prevertebral soft tissue swelling, likely related to recent surgery. Disc levels: Cervical disc space assessment limited by motion artifact. No gross change from recent prior postoperative study. There is multilevel spondylosis contributing to mild spinal stenosis and variable degrees of foraminal narrowing at  multiple levels. As above, possible T2 hyperintensity in the cord at C3, suboptimally evaluated due to motion. MRI THORACIC SPINE FINDINGS Alignment: Mild convex right scoliosis. No focal angulation or listhesis. Vertebrae: No acute or suspicious osseous findings. Cord:  The thoracic cord appears normal in signal and caliber. Paraspinal and other soft tissues: No significant paraspinal abnormalities. Disc levels: Very mild thoracic spondylosis with mild disc bulging and endplate osteophyte formation at T9-10, T10-11 and T11-12. No focal disc herniation, significant spinal stenosis or foraminal narrowing demonstrated. MRI LUMBAR SPINE FINDINGS Segmentation: Conventional anatomy assumed, with the last open disc space designated L5-S1.Concordant with prior imaging. Alignment: Stable mild convex left scoliosis and trace retrolisthesis at L2-3. Vertebrae: No worrisome osseous lesion, acute fracture or pars defect. Asymmetric facet arthropathy on the right at L3-4. Conus medullaris: Extends to the L1 level and appears normal. Paraspinal and other soft tissues: No acute paraspinal findings. Mild soft tissue edema around the right L3-4 facet joint. 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 This is suboptimally evaluated by this examination, although appears grossly similar to previous lumbar MRI and abdominal CTA. Disc levels: L1-2: Preserved disc height. Mild facet hypertrophy. No spinal stenosis or foraminal narrowing. L2-3: Mild loss of disc height with  annular disc bulging and endplate osteophytes. Mild facet and ligamentous hypertrophy. Unchanged mild spinal stenosis and mild lateral recess narrowing on the right. No significant foraminal narrowing or nerve root impingement. L3-4: Mild loss of disc height with annular disc bulging, endplate osteophytes and moderate facet/ligamentous hypertrophy. Resulting  moderate multifactorial spinal stenosis with asymmetric right lateral recess and right foraminal narrowing. Findings are similar to previous MRI. L4-5: Chronic loss of disc height with annular disc bulging, facet and ligamentous hypertrophy. Similar mild spinal stenosis with asymmetric left lateral recess narrowing. Mild foraminal narrowing bilaterally without L4 nerve root impingement. L5-S1: Mild chronic loss of disc height with annular disc bulging and endplate osteophytes asymmetric to the left. Bilateral facet hypertrophy. Stable mild left foraminal narrowing. The spinal canal and right foramen are patent. IMPRESSION: 1. Motion degraded examination, especially of the cervical spine. 2. Postsurgical changes from recent C3-4 ACDF. No hardware displacement or acute fracture identified. 3. Diffuse effacement of the CSF surrounding the cord in the cervical spine, similar to prior studies. Possible mild T2 hyperintensity in the cord at C3, suboptimally evaluated due to motion. 4. Given the motion limitations of this examination, consider further evaluation with CT of the cervical spine. 5. No acute findings or significant changes in the thoracic or lumbar spine. 6. 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 7. 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 This is suboptimally evaluated by this examination, although appears grossly similar to previous lumbar MRI and abdominal CTA. See follow up recommendations from abdominal CTA 11/20/2023. Electronically Signed   By: Elsie Perone M.D.   On: 01/16/2024 13:31   MR THORACIC SPINE WO CONTRAST Result Date: 01/16/2024 CLINICAL DATA:  Neck pain with worsening weakness in all 4 extremities. History of cervical decompression and fusion 01/08/2024. EXAM: MRI CERVICAL, THORACIC AND LUMBAR SPINE WITHOUT CONTRAST TECHNIQUE: Multiplanar and multiecho pulse sequences of the  cervical spine, to include the craniocervical junction and cervicothoracic junction, and thoracic and lumbar spine, were obtained without intravenous contrast. COMPARISON:  Cervical MRI 01/12/2024 and 11/15/2023. Lumbar MRI 11/15/2023. Chest CTA 01/12/2024. Abdominopelvic CT 11/20/2023. FINDINGS: Technical note: Despite efforts by the technologist and patient, mild motion artifact is present on today's exam and could not be eliminated. This reduces exam sensitivity and specificity. MRI CERVICAL SPINE FINDINGS Alignment: The alignment is stable with straightening. No focal angulation or significant listhesis. Vertebrae: Postsurgical changes from recent C3-4 ACDF. No hardware displacement or acute fracture identified. Heterogeneous marrow edema appears similar to prior studies, likely discogenic. Assessment limited by motion. No specific evidence of discitis or osteomyelitis. Cord: Diffuse effacement of the CSF surrounding the cord, similar to prior studies. Possible mild T2 hyperintensity in the cord at C3, suboptimally evaluated due to motion. No epidural fluid collections are identified. Posterior Fossa, vertebral arteries, paraspinal tissues: Visualized portions of the posterior fossa appear unremarkable.Intracranial findings are dictated separately. Preserved vertebral artery flow voids. Similar prevertebral soft tissue swelling, likely related to recent surgery. Disc levels: Cervical disc space assessment limited by motion artifact. No gross change from recent prior postoperative study. There is multilevel spondylosis contributing to mild spinal stenosis and variable degrees of foraminal narrowing at multiple levels. As above, possible T2 hyperintensity in the cord at C3, suboptimally evaluated due to motion. MRI THORACIC SPINE FINDINGS Alignment: Mild convex right scoliosis. No focal angulation or listhesis. Vertebrae: No acute or suspicious osseous findings. Cord:  The thoracic cord appears normal in signal  and  caliber. Paraspinal and other soft tissues: No significant paraspinal abnormalities. Disc levels: Very mild thoracic spondylosis with mild disc bulging and endplate osteophyte formation at T9-10, T10-11 and T11-12. No focal disc herniation, significant spinal stenosis or foraminal narrowing demonstrated. MRI LUMBAR SPINE FINDINGS Segmentation: Conventional anatomy assumed, with the last open disc space designated L5-S1.Concordant with prior imaging. Alignment: Stable mild convex left scoliosis and trace retrolisthesis at L2-3. Vertebrae: No worrisome osseous lesion, acute fracture or pars defect. Asymmetric facet arthropathy on the right at L3-4. Conus medullaris: Extends to the L1 level and appears normal. Paraspinal and other soft tissues: No acute paraspinal findings. Mild soft tissue edema around the right L3-4 facet joint. 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 This is suboptimally evaluated by this examination, although appears grossly similar to previous lumbar MRI and abdominal CTA. Disc levels: L1-2: Preserved disc height. Mild facet hypertrophy. No spinal stenosis or foraminal narrowing. L2-3: Mild loss of disc height with annular disc bulging and endplate osteophytes. Mild facet and ligamentous hypertrophy. Unchanged mild spinal stenosis and mild lateral recess narrowing on the right. No significant foraminal narrowing or nerve root impingement. L3-4: Mild loss of disc height with annular disc bulging, endplate osteophytes and moderate facet/ligamentous hypertrophy. Resulting moderate multifactorial spinal stenosis with asymmetric right lateral recess and right foraminal narrowing. Findings are similar to previous MRI. L4-5: Chronic loss of disc height with annular disc bulging, facet and ligamentous hypertrophy. Similar mild spinal stenosis with asymmetric left lateral recess narrowing.  Mild foraminal narrowing bilaterally without L4 nerve root impingement. L5-S1: Mild chronic loss of disc height with annular disc bulging and endplate osteophytes asymmetric to the left. Bilateral facet hypertrophy. Stable mild left foraminal narrowing. The spinal canal and right foramen are patent. IMPRESSION: 1. Motion degraded examination, especially of the cervical spine. 2. Postsurgical changes from recent C3-4 ACDF. No hardware displacement or acute fracture identified. 3. Diffuse effacement of the CSF surrounding the cord in the cervical spine, similar to prior studies. Possible mild T2 hyperintensity in the cord at C3, suboptimally evaluated due to motion. 4. Given the motion limitations of this examination, consider further evaluation with CT of the cervical spine. 5. No acute findings or significant changes in the thoracic or lumbar spine. 6. 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 7. 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 This is suboptimally evaluated by this examination, although appears grossly similar to previous lumbar MRI and abdominal CTA. See follow up recommendations from abdominal CTA 11/20/2023. Electronically Signed   By: Elsie Perone M.D.   On: 01/16/2024 13:31   MR LUMBAR SPINE WO CONTRAST Result Date: 01/16/2024 CLINICAL DATA:  Neck pain with worsening weakness in all 4 extremities. History of cervical decompression and fusion 01/08/2024. EXAM: MRI CERVICAL, THORACIC AND LUMBAR SPINE WITHOUT CONTRAST TECHNIQUE: Multiplanar and multiecho pulse sequences of the cervical spine, to include the craniocervical junction and cervicothoracic junction, and thoracic and lumbar spine, were obtained without intravenous contrast. COMPARISON:  Cervical MRI 01/12/2024 and 11/15/2023. Lumbar MRI 11/15/2023. Chest CTA 01/12/2024. Abdominopelvic CT 11/20/2023. FINDINGS: Technical note: Despite  efforts by the technologist and patient, mild motion artifact is present on today's exam and could not be eliminated. This reduces exam sensitivity and specificity. MRI CERVICAL SPINE FINDINGS Alignment: The alignment is stable with straightening. No focal angulation or significant listhesis. Vertebrae: Postsurgical changes  from recent C3-4 ACDF. No hardware displacement or acute fracture identified. Heterogeneous marrow edema appears similar to prior studies, likely discogenic. Assessment limited by motion. No specific evidence of discitis or osteomyelitis. Cord: Diffuse effacement of the CSF surrounding the cord, similar to prior studies. Possible mild T2 hyperintensity in the cord at C3, suboptimally evaluated due to motion. No epidural fluid collections are identified. Posterior Fossa, vertebral arteries, paraspinal tissues: Visualized portions of the posterior fossa appear unremarkable.Intracranial findings are dictated separately. Preserved vertebral artery flow voids. Similar prevertebral soft tissue swelling, likely related to recent surgery. Disc levels: Cervical disc space assessment limited by motion artifact. No gross change from recent prior postoperative study. There is multilevel spondylosis contributing to mild spinal stenosis and variable degrees of foraminal narrowing at multiple levels. As above, possible T2 hyperintensity in the cord at C3, suboptimally evaluated due to motion. MRI THORACIC SPINE FINDINGS Alignment: Mild convex right scoliosis. No focal angulation or listhesis. Vertebrae: No acute or suspicious osseous findings. Cord:  The thoracic cord appears normal in signal and caliber. Paraspinal and other soft tissues: No significant paraspinal abnormalities. Disc levels: Very mild thoracic spondylosis with mild disc bulging and endplate osteophyte formation at T9-10, T10-11 and T11-12. No focal disc herniation, significant spinal stenosis or foraminal narrowing demonstrated. MRI LUMBAR  SPINE FINDINGS Segmentation: Conventional anatomy assumed, with the last open disc space designated L5-S1.Concordant with prior imaging. Alignment: Stable mild convex left scoliosis and trace retrolisthesis at L2-3. Vertebrae: No worrisome osseous lesion, acute fracture or pars defect. Asymmetric facet arthropathy on the right at L3-4. Conus medullaris: Extends to the L1 level and appears normal. Paraspinal and other soft tissues: No acute paraspinal findings. Mild soft tissue edema around the right L3-4 facet joint. 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 This is suboptimally evaluated by this examination, although appears grossly similar to previous lumbar MRI and abdominal CTA. Disc levels: L1-2: Preserved disc height. Mild facet hypertrophy. No spinal stenosis or foraminal narrowing. L2-3: Mild loss of disc height with annular disc bulging and endplate osteophytes. Mild facet and ligamentous hypertrophy. Unchanged mild spinal stenosis and mild lateral recess narrowing on the right. No significant foraminal narrowing or nerve root impingement. L3-4: Mild loss of disc height with annular disc bulging, endplate osteophytes and moderate facet/ligamentous hypertrophy. Resulting moderate multifactorial spinal stenosis with asymmetric right lateral recess and right foraminal narrowing. Findings are similar to previous MRI. L4-5: Chronic loss of disc height with annular disc bulging, facet and ligamentous hypertrophy. Similar mild spinal stenosis with asymmetric left lateral recess narrowing. Mild foraminal narrowing bilaterally without L4 nerve root impingement. L5-S1: Mild chronic loss of disc height with annular disc bulging and endplate osteophytes asymmetric to the left. Bilateral facet hypertrophy. Stable mild left foraminal narrowing. The spinal canal and right foramen are patent. IMPRESSION: 1. Motion  degraded examination, especially of the cervical spine. 2. Postsurgical changes from recent C3-4 ACDF. No hardware displacement or acute fracture identified. 3. Diffuse effacement of the CSF surrounding the cord in the cervical spine, similar to prior studies. Possible mild T2 hyperintensity in the cord at C3, suboptimally evaluated due to motion. 4. Given the motion limitations of this examination, consider further evaluation with CT of the cervical spine. 5. No acute findings or significant changes in the thoracic or lumbar spine. 6. 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 7. 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 This is suboptimally evaluated by this examination, although appears grossly similar to previous lumbar MRI and abdominal CTA. See follow up recommendations from abdominal CTA 11/20/2023. Electronically Signed   By: Elsie Perone M.D.   On: 01/16/2024 13:31        Scheduled Meds:  apixaban   5 mg Oral BID   atorvastatin   40 mg Oral Daily   cloNIDine   0.1 mg Oral TID   fluticasone  furoate-vilanterol  1 puff Inhalation Daily   folic acid   1 mg Oral Daily   lisinopril   20 mg Oral Daily   metoprolol  succinate  100 mg Oral Daily   multivitamin with minerals  1 tablet Oral Daily   pregabalin   50 mg Oral BID   senna  1 tablet Oral Daily   thiamine   100 mg Oral Daily   Or   thiamine   100 mg Intravenous Daily   Continuous Infusions:   LOS: 0 days     Devaughn KATHEE Ban, MD [facility]   If 7PM-7AM, please contact night-coverage [email or web address] Password [facility] 01/18/2024, 12:43 PM

## 2024-01-18 NOTE — Progress Notes (Addendum)
 Mobility Specialist - Progress Note    01/18/24 1100  Mobility  Activity Ambulated with assistance  Level of Assistance Moderate assist, patient does 50-74%  Assistive Device Front wheel walker  Distance Ambulated (ft) 60 ft  Range of Motion/Exercises Active  Activity Response Tolerated well  Mobility Referral Yes  Mobility visit 1 Mobility  Mobility Specialist Start Time (ACUTE ONLY) 1057  Mobility Specialist Stop Time (ACUTE ONLY) 1114  Mobility Specialist Time Calculation (min) (ACUTE ONLY) 17 min   Pt resting in bed on RA upon entry. Pt STS and ambulates to door and back to chair x4 ModA due to trouble taking steps/ instability. Pt requires heavy manual directional cuing to avoid falling. Pt left in recliner with needs in reach. Chair alarm activated.   Guido Rumble Mobility Specialist 01/18/24, 2:46 PM

## 2024-01-19 ENCOUNTER — Telehealth: Payer: Self-pay | Admitting: Neurosurgery

## 2024-01-19 ENCOUNTER — Telehealth: Payer: Self-pay

## 2024-01-19 DIAGNOSIS — R531 Weakness: Secondary | ICD-10-CM | POA: Diagnosis not present

## 2024-01-19 LAB — BASIC METABOLIC PANEL WITH GFR
Anion gap: 9 (ref 5–15)
BUN: 15 mg/dL (ref 8–23)
CO2: 27 mmol/L (ref 22–32)
Calcium: 8.8 mg/dL — ABNORMAL LOW (ref 8.9–10.3)
Chloride: 93 mmol/L — ABNORMAL LOW (ref 98–111)
Creatinine, Ser: 0.45 mg/dL — ABNORMAL LOW (ref 0.61–1.24)
GFR, Estimated: >60 mL/min (ref >60)
Glucose, Bld: 110 mg/dL — ABNORMAL HIGH (ref 70–99)
Potassium: 3.5 mmol/L (ref 3.5–5.1)
Sodium: 129 mmol/L — ABNORMAL LOW (ref 135–145)

## 2024-01-19 MED ORDER — ENSURE PLUS HIGH PROTEIN PO LIQD
237.0000 mL | Freq: Two times a day (BID) | ORAL | Status: DC
  Administered 2024-01-19 – 2024-01-23 (×7): 237 mL via ORAL

## 2024-01-19 MED ORDER — ORAL CARE MOUTH RINSE: 15.0000 mL | OROMUCOSAL | Status: DC | PRN

## 2024-01-19 MED ORDER — POLYVINYL ALCOHOL 1.4 % OP SOLN
1.0000 [drp] | OPHTHALMIC | Status: DC | PRN
  Filled 2024-01-19: qty 15

## 2024-01-19 NOTE — Progress Notes (Signed)
 PROGRESS NOTE    JUDA [name]  FMW:[id number] DOB: 07/04/1960 DOA: 01/16/2024 PCP: Everlene Parris LABOR, MD  Outpatient Specialists: neurosurgery    Brief Narrative:   From admission h and p  Scott [name] is a pleasant 63 y.o. male with medical history significant for chronic foot pain, arthritis, COPD, DVT/PE on Eliquis , CAD, neck pain s/p recent ACDF on 01/08/2024 for cervical spinal stenosis at C3-C4 level, recent admission after 4 days of that surgery after a fall, presented to ED at [facility] today with worsening weakness to his bilateral upper and lower extremities.  This has been ongoing for the past week but was worsened over time.  Patient was here on 9/22 for similar symptoms and he was eval by neurosurgery and no further intervention was needed at that time. Since patient was discharged home he continued to have worsening symptoms, he has not been able to pick up any of his medications including his Lyrica  or Eliquis .  Lives at home alone and has been having more difficulty using his walker to ambulate.  He stated that he slipped out of his chair onto the floor and has lower back pain now.  He stated that he did not hit his head, does not have incontinence or saddle anesthesia. EMS note: Patient was wheezing gave him 1 DuoNeb and route.  Patient was complaining of pain to his bilateral upper and lower extremity as well as back and neck and left foot.  He does have chronic pain and foot pain.  Denies any chest pain, shortness of breath, fever, palpitations, cough or urinary symptoms.  Assessment & Plan:   Principal Problem:   Generalized weakness Active Problems:   Fall at home, initial encounter   Hematoma of upper extremity (Left)   CAD (coronary artery disease)   COPD (chronic obstructive pulmonary disease) (HCC)   Hypercholesterolemia   Chronic foot pain (1ry area of Pain) (Left)   Alcohol  abuse   Pulmonary emboli (HCC)  # Cervical myelopathy S/p recent ACDF. MRI doesn't  appear to show any acute changes. Neurosurg has seen, advises no additional surgery - PT advising SNF, patient agreeable, TOC consulted - home prn dialudid  # PE, acute # History dvt Small recent PE, no hypoxia or signs right heart strain - continue apixaban   # Alcohol  abuse Admits to 4 drinks a day. No s/s withdrawal currently - vitamins - ciwa  # HTN Bp normalized - home acei - hold home hydrochlorothiazide  as below  # Hyponatremia Chronic. Home hydrochlorothiazide  may contribute. As may alcohol  abuse. Last dose 9/28 - hold hydrochlorothiazide  and trend  # COPD Stable - breo for home symbicort  # PAD - have discontinued home asa given recent start of apixaban    DVT prophylaxis: apixaban  Code Status: full Family Communication: sister updated telephonically 9/28, shared decision to give periodic updates unless status changes  Level of care: Med-Surg Status is: Observation    Consultants:  neurosurgery  Procedures: none  Antimicrobials:  none    Subjective: Reports feeling fine, pain controlled.  Breathing comfortably. Tolerating diet  Objective: Vitals:   01/18/24 1700 01/18/24 2006 01/19/24 0428 01/19/24 0756  BP: (!) (P) 155/107 (!) 132/94 (!) 143/83 (!) 148/89  Pulse: (!) (P) 116 84 81 88  Resp: (P) 16 18 16 16   Temp: (P) 98.1 F (36.7 C) 97.8 F (36.6 C) 98 F (36.7 C) 99.1 F (37.3 C)  TempSrc: (P) Oral   Oral  SpO2: (P) 98% 94% 91% 94%  Weight:  Height:        Intake/Output Summary (Last 24 hours) at 01/19/2024 1329 Last data filed at 01/19/2024 0900 Gross per 24 hour  Intake 720 ml  Output 900 ml  Net -180 ml   Filed Weights   01/16/24 0722  Weight: 70.8 kg    Examination:  General exam: Appears calm and comfortable  Respiratory system: few scattered rhonchi, normal wob Cardiovascular system: S1 & S2 heard, RRR.   Gastrointestinal system: Abdomen is nondistended, soft and nontender.  Central nervous system: Alert and  oriented. Decreased strength 2/2 pain upper and lower extremities Extremities: Symmetric 5 x 5 power. Skin: bruising on arms, healing incision anterior neck c/d/i Psychiatry: calm    Data Reviewed: I have personally reviewed following labs and imaging studies  CBC: Recent Labs  Lab 01/12/24 1827 01/15/24 2345  WBC 8.5 8.5  HGB 14.6 14.3  HCT 40.8 38.7*  MCV 96.7 94.6  PLT 226 242   Basic Metabolic Panel: Recent Labs  Lab 01/12/24 1827 01/15/24 2345 01/17/24 0950 01/18/24 0402 01/19/24 0542  NA 129* 127* 131* 128* 129*  K 3.8 3.3* 3.2* 3.2* 3.5  CL 91* 86* 91* 91* 93*  CO2 24 25 28 27 27   GLUCOSE 93 90 186* 94 110*  BUN 10 9 9 13 15   CREATININE 0.52* 0.56* 0.47* 0.45* 0.45*  CALCIUM  8.9 8.8* 9.0 8.9 8.8*  MG  --   --   --  1.8  --    GFR: Estimated Creatinine Clearance: 94.6 mL/min (A) (by C-G formula based on SCr of 0.45 mg/dL (L)). Liver Function Tests: Recent Labs  Lab 01/15/24 2345  AST 36  ALT 24  ALKPHOS 69  BILITOT 0.7  PROT 6.8  ALBUMIN  3.5   No results for input(s): LIPASE, AMYLASE in the last 168 hours. No results for input(s): AMMONIA in the last 168 hours. Coagulation Profile: Recent Labs  Lab 01/13/24 0022  INR 1.1   Cardiac Enzymes: No results for input(s): CKTOTAL, CKMB, CKMBINDEX, TROPONINI in the last 168 hours. BNP (last 3 results) No results for input(s): PROBNP in the last 8760 hours. HbA1C: No results for input(s): HGBA1C in the last 72 hours. CBG: No results for input(s): GLUCAP in the last 168 hours. Lipid Profile: No results for input(s): CHOL, HDL, LDLCALC, TRIG, CHOLHDL, LDLDIRECT in the last 72 hours. Thyroid  Function Tests: No results for input(s): TSH, T4TOTAL, FREET4, T3FREE, THYROIDAB in the last 72 hours. Anemia Panel: No results for input(s): VITAMINB12, FOLATE, FERRITIN, TIBC, IRON, RETICCTPCT in the last 72 hours. Urine analysis:    Component Value  Date/Time   COLORURINE YELLOW (A) 01/16/2024 0720   APPEARANCEUR CLEAR (A) 01/16/2024 0720   LABSPEC 1.004 (L) 01/16/2024 0720   PHURINE 6.0 01/16/2024 0720   GLUCOSEU NEGATIVE 01/16/2024 0720   HGBUR NEGATIVE 01/16/2024 0720   BILIRUBINUR NEGATIVE 01/16/2024 0720   KETONESUR 5 (A) 01/16/2024 0720   PROTEINUR NEGATIVE 01/16/2024 0720   NITRITE NEGATIVE 01/16/2024 0720   LEUKOCYTESUR NEGATIVE 01/16/2024 0720   Sepsis Labs: @LABRCNTIP (procalcitonin:4,lacticidven:4)  ) Recent Results (from the past 240 hours)  Resp panel by RT-PCR (RSV, Flu A&B, Covid) Anterior Nasal Swab     Status: None   Collection Time: 01/16/24 10:10 AM   Specimen: Anterior Nasal Swab  Result Value Ref Range Status   SARS Coronavirus 2 by RT PCR NEGATIVE NEGATIVE Final    Comment: (NOTE) SARS-CoV-2 target nucleic acids are NOT DETECTED.  The SARS-CoV-2 RNA is generally detectable in upper respiratory  specimens during the acute phase of infection. The lowest concentration of SARS-CoV-2 viral copies this assay can detect is 138 copies/mL. A negative result does not preclude SARS-Cov-2 infection and should not be used as the sole basis for treatment or other patient management decisions. A negative result may occur with  improper specimen collection/handling, submission of specimen other than nasopharyngeal swab, presence of viral mutation(s) within the areas targeted by this assay, and inadequate number of viral copies(<138 copies/mL). A negative result must be combined with clinical observations, patient history, and epidemiological information. The expected result is Negative.  Fact Sheet for Patients:  BloggerCourse.com  Fact Sheet for Healthcare Providers:  SeriousBroker.it  This test is no t yet approved or cleared by the [place]  FDA and  has been authorized for detection and/or diagnosis of SARS-CoV-2 by FDA under an Emergency Use  Authorization (EUA). This EUA will remain  in effect (meaning this test can be used) for the duration of the COVID-19 declaration under Section 564(b)(1) of the Act, 21 U.S.C.section 360bbb-3(b)(1), unless the authorization is terminated  or revoked sooner.       Influenza A by PCR NEGATIVE NEGATIVE Final   Influenza B by PCR NEGATIVE NEGATIVE Final    Comment: (NOTE) The Xpert Xpress SARS-CoV-2/FLU/RSV plus assay is intended as an aid in the diagnosis of influenza from Nasopharyngeal swab specimens and should not be used as a sole basis for treatment. Nasal washings and aspirates are unacceptable for Xpert Xpress SARS-CoV-2/FLU/RSV testing.  Fact Sheet for Patients: BloggerCourse.com  Fact Sheet for Healthcare Providers: SeriousBroker.it  This test is not yet approved or cleared by the [place]  FDA and has been authorized for detection and/or diagnosis of SARS-CoV-2 by FDA under an Emergency Use Authorization (EUA). This EUA will remain in effect (meaning this test can be used) for the duration of the COVID-19 declaration under Section 564(b)(1) of the Act, 21 U.S.C. section 360bbb-3(b)(1), unless the authorization is terminated or revoked.     Resp Syncytial Virus by PCR NEGATIVE NEGATIVE Final    Comment: (NOTE) Fact Sheet for Patients: BloggerCourse.com  Fact Sheet for Healthcare Providers: SeriousBroker.it  This test is not yet approved or cleared by the [place]  FDA and has been authorized for detection and/or diagnosis of SARS-CoV-2 by FDA under an Emergency Use Authorization (EUA). This EUA will remain in effect (meaning this test can be used) for the duration of the COVID-19 declaration under Section 564(b)(1) of the Act, 21 U.S.C. section 360bbb-3(b)(1), unless the authorization is terminated or revoked.  Performed at [facility], [address] [place], [place] [address]          Radiology Studies: No results found.       Scheduled Meds:  apixaban   5 mg Oral BID   atorvastatin   40 mg Oral Daily   cloNIDine   0.1 mg Oral TID   feeding supplement  237 mL Oral BID BM   fluticasone  furoate-vilanterol  1 puff Inhalation Daily   folic acid   1 mg Oral Daily   lisinopril   20 mg Oral Daily   magnesium  oxide  800 mg Oral Daily   metoprolol  succinate  100 mg Oral Daily   multivitamin with minerals  1 tablet Oral Daily   polyethylene glycol  17 g Oral Daily   pregabalin   50 mg Oral BID   senna  1 tablet Oral Daily   thiamine   100 mg Oral Daily   Or   thiamine   100 mg Intravenous  Daily   Continuous Infusions:   LOS: 0 days     Devaughn KATHEE Ban, MD [facility]   If 7PM-7AM, please contact night-coverage [email or web address] Password [email or web address] 01/19/2024, 1:29 PM

## 2024-01-19 NOTE — Progress Notes (Signed)
 Mobility Specialist Progress Note:    01/19/24 1236  Mobility  Activity Stood at bedside;Pivoted/transferred from bed to chair  Level of Assistance Moderate assist, patient does 50-74%  Assistive Device Front wheel walker  Distance Ambulated (ft) 3 ft  Range of Motion/Exercises Active;All extremities  Activity Response Tolerated well  Mobility visit 1 Mobility  Mobility Specialist Start Time (ACUTE ONLY) 1207  Mobility Specialist Stop Time (ACUTE ONLY) 1224  Mobility Specialist Time Calculation (min) (ACUTE ONLY) 17 min   Pt received in bed, agreeable to mobility. Required ModA to stand and transfer with RW. Tolerated well, c/o feeling tight from not getting OOB. Alarm on, belongings in reach. All needs met.  Sherrilee Ditty Mobility Specialist Please contact via Special educational needs teacher or  Rehab office at [phone]

## 2024-01-19 NOTE — Telephone Encounter (Signed)
 Patient is calling to let our office know that he was told by one of the attending physician's in the hospital over the weekend that he would not be having any further surgeries and would like to know what the doctor meant by this and whether or not he would be able to have his carpal tunnel surgery in November. He asked that we give his sister, Magalene a call back Please advise.

## 2024-01-19 NOTE — Progress Notes (Signed)
 Occupational Therapy Treatment Patient Details Name: Scott [name] MRN: [id number] DOB: 1960/12/29 Today's Date: 01/19/2024   History of present illness Pt is a 63 y.o. male presented to ED at [facility] with worsening weakness to his bilateral upper and lower extremities. He is s/p recent ACDF on 01/08/2024 for cervical spinal stenosis at C3-C4 level, recent admission after 4 days of that surgery after a fall with neuro follow up with no further intervention recommended. PMH of chronic foot pain, arthritis, COPD, DVT/PE on Eliquis , CAD, neck pain, HTN, PVD, AAA, GERD, anxiety, depression, etoh abuse.   OT comments  Pt seen for OT tx. Pt in bed, agreeable to session, denies complaints except feeling locked up from being in the bed and notes his hands aren't working as well as previous date. Pt required MIN A for bed mobility, MAX A for donning socks and shoes, and MIN A with VC for sequencing to stand with RW. Assist required to place L hand on RW as well. Pt unsteady in standing requiring MIN A to maintain balance during pivot/turn to the recliner, notable for posterior lean. Easily distracted this session, requiring VC to redirect. Balance worsens with dual tasking as well. Pt in recliner for meal, all needs in reach. Continues to benefit from skilled OT Services.       If plan is discharge home, recommend the following:  A little help with walking and/or transfers;A lot of help with bathing/dressing/bathroom;Help with stairs or ramp for entrance;Assist for transportation;Assistance with cooking/housework   Equipment Recommendations  Other (comment) (defer)    Recommendations for Other Services      Precautions / Restrictions Precautions Precautions: Cervical;Fall Recall of Precautions/Restrictions: Impaired Restrictions Weight Bearing Restrictions Per Provider Order: No       Mobility Bed Mobility Overal bed mobility: Needs Assistance Bed Mobility: Rolling, Sidelying to Sit Rolling:  Min assist Sidelying to sit: Mod assist, Min assist            Transfers Overall transfer level: Needs assistance Equipment used: Rolling walker (2 wheels) Transfers: Sit to/from Stand Sit to Stand: Min assist           General transfer comment: assist to place L hand on RW, VC for anterior weight shift prior to standing     Balance Overall balance assessment: Needs assistance Sitting-balance support: Feet supported Sitting balance-Leahy Scale: Fair   Postural control: Posterior lean Standing balance support: Bilateral upper extremity supported, Reliant on assistive device for balance Standing balance-Leahy Scale: Poor Standing balance comment: MIN A +VC for sequencing during turning with progressive posterior lean and feet out in front of him                           ADL either performed or assessed with clinical judgement   ADL Overall ADL's : Needs assistance/impaired                     Lower Body Dressing: Sitting/lateral leans;Maximal assistance Lower Body Dressing Details (indicate cue type and reason): don socks and tennis shoes             Functional mobility during ADLs: Minimal assistance;Rolling walker (2 wheels);Cueing for safety;Cueing for sequencing      Extremity/Trunk Assessment              Vision       Perception     Praxis     Communication Communication Communication: No apparent difficulties  Cognition Arousal: Alert Behavior During Therapy: WFL for tasks assessed/performed               OT - Cognition Comments: mildly impulsive, easily distracted but able to redirect with VC                 Following commands: Impaired Following commands impaired: Follows multi-step commands with increased time      Cueing   Cueing Techniques: Verbal cues, Visual cues, Gestural cues  Exercises      Shoulder Instructions       General Comments      Pertinent Vitals/ Pain       Pain  Assessment Pain Assessment: No/denies pain  Home Living                                          Prior Functioning/Environment              Frequency  Min 2X/week        Progress Toward Goals  OT Goals(current goals can now be found in the care plan section)  Progress towards OT goals: Progressing toward goals  Acute Rehab OT Goals Patient Stated Goal: improve function/safety/strength OT Goal Formulation: With patient Time For Goal Achievement: 01/30/24 Potential to Achieve Goals: Good  Plan      Co-evaluation                 AM-PAC OT 6 Clicks Daily Activity     Outcome Measure   Help from another person eating meals?: A Little Help from another person taking care of personal grooming?: A Little Help from another person toileting, which includes using toliet, bedpan, or urinal?: A Lot Help from another person bathing (including washing, rinsing, drying)?: A Lot Help from another person to put on and taking off regular upper body clothing?: A Lot Help from another person to put on and taking off regular lower body clothing?: A Lot 6 Click Score: 14    End of Session Equipment Utilized During Treatment: Rolling walker (2 wheels)  OT Visit Diagnosis: Other abnormalities of gait and mobility (R26.89);Muscle weakness (generalized) (M62.81)   Activity Tolerance Patient tolerated treatment well   Patient Left in chair;with call bell/phone within reach;with chair alarm set   Nurse Communication Mobility status        Time: 8376-8363 OT Time Calculation (min): 13 min  Charges: OT General Charges $OT Visit: 1 Visit OT Treatments $Self Care/Home Management : 8-22 mins  Warren SAUNDERS., MPH, MS, OTR/L ascom [phone] 01/19/24, 4:44 PM

## 2024-01-19 NOTE — Plan of Care (Signed)
  Problem: Health Behavior/Discharge Planning: Goal: Ability to manage health-related needs will improve Outcome: Not Progressing   Problem: Clinical Measurements: Goal: Respiratory complications will improve Outcome: Progressing Goal: Cardiovascular complication will be avoided Outcome: Progressing   Problem: Activity: Goal: Risk for activity intolerance will decrease Outcome: Not Progressing

## 2024-01-20 DIAGNOSIS — R531 Weakness: Secondary | ICD-10-CM | POA: Diagnosis not present

## 2024-01-20 LAB — BASIC METABOLIC PANEL WITH GFR
Anion gap: 9 (ref 5–15)
BUN: 22 mg/dL (ref 8–23)
CO2: 26 mmol/L (ref 22–32)
Calcium: 8.4 mg/dL — ABNORMAL LOW (ref 8.9–10.3)
Chloride: 93 mmol/L — ABNORMAL LOW (ref 98–111)
Creatinine, Ser: 0.64 mg/dL (ref 0.61–1.24)
GFR, Estimated: >60 mL/min (ref >60)
Glucose, Bld: 108 mg/dL — ABNORMAL HIGH (ref 70–99)
Potassium: 3.5 mmol/L (ref 3.5–5.1)
Sodium: 128 mmol/L — ABNORMAL LOW (ref 135–145)

## 2024-01-20 LAB — SODIUM, URINE, RANDOM: Sodium, Ur: 35 mmol/L

## 2024-01-20 LAB — OSMOLALITY: Osmolality: 270 mosm/kg — ABNORMAL LOW (ref 275–295)

## 2024-01-20 LAB — TSH: TSH: 1.448 u[IU]/mL (ref 0.350–4.500)

## 2024-01-20 LAB — OSMOLALITY, URINE: Osmolality, Ur: 756 mosm/kg (ref 300–900)

## 2024-01-20 MED ORDER — SODIUM CHLORIDE 1 G PO TABS
1.0000 g | ORAL_TABLET | Freq: Two times a day (BID) | ORAL | Status: DC
  Administered 2024-01-20 – 2024-01-23 (×7): 1 g via ORAL
  Filled 2024-01-20 (×7): qty 1

## 2024-01-20 MED ORDER — LORAZEPAM 1 MG PO TABS
1.0000 mg | ORAL_TABLET | ORAL | Status: AC | PRN
Start: 2024-01-20 — End: 2024-01-23

## 2024-01-20 MED ORDER — ACETAMINOPHEN 325 MG PO TABS
650.0000 mg | ORAL_TABLET | Freq: Four times a day (QID) | ORAL | Status: DC | PRN
  Administered 2024-01-20: 650 mg via ORAL
  Filled 2024-01-20 (×2): qty 2

## 2024-01-20 MED ORDER — LORAZEPAM 2 MG/ML IJ SOLN: 1.0000 mg | INTRAMUSCULAR | Status: AC | PRN

## 2024-01-20 NOTE — Plan of Care (Signed)

## 2024-01-20 NOTE — Progress Notes (Signed)
 Mobility Specialist - Progress Note     01/20/24 1402  Mobility  Activity Ambulated with assistance;Stood at bedside  Level of Assistance Moderate assist, patient does 50-74%  Assistive Device Front wheel walker  Distance Ambulated (ft) 45 ft  Range of Motion/Exercises Active  Activity Response Tolerated well  Mobility Referral Yes  Mobility visit 1 Mobility  Mobility Specialist Start Time (ACUTE ONLY) 1346  Mobility Specialist Stop Time (ACUTE ONLY) 1400  Mobility Specialist Time Calculation (min) (ACUTE ONLY) 14 min   Pt resting in recliner on RA upon entry. Pt STS and ambulates to hallway Min/ModA for safety and stability due to unbalanced walking gait. Pt was a bit more impulsive today during ambulation. Pt would stop and pick up his walker several times simulating curl ups. OT corrected hand placement on walker. Pt returned to recliner and left with needs in reach and chair alarm activated.   Guido Rumble Mobility Specialist 01/20/24, 2:07 PM

## 2024-01-20 NOTE — Telephone Encounter (Signed)
 I notified the patient of Dr Theressa response.

## 2024-01-20 NOTE — Progress Notes (Signed)
 PROGRESS NOTE    CHANCELOR [name]  FMW:[id number] DOB: 10-17-1960 DOA: 01/16/2024 PCP: Everlene Parris LABOR, MD  Outpatient Specialists: neurosurgery    Brief Narrative:   From admission h and p  Scott [name] is a pleasant 63 y.o. male with medical history significant for chronic foot pain, arthritis, COPD, DVT/PE on Eliquis , CAD, neck pain s/p recent ACDF on 01/08/2024 for cervical spinal stenosis at C3-C4 level, recent admission after 4 days of that surgery after a fall, presented to ED at [facility] today with worsening weakness to his bilateral upper and lower extremities.  This has been ongoing for the past week but was worsened over time.  Patient was here on 9/22 for similar symptoms and he was eval by neurosurgery and no further intervention was needed at that time. Since patient was discharged home he continued to have worsening symptoms, he has not been able to pick up any of his medications including his Lyrica  or Eliquis .  Lives at home alone and has been having more difficulty using his walker to ambulate.  He stated that he slipped out of his chair onto the floor and has lower back pain now.  He stated that he did not hit his head, does not have incontinence or saddle anesthesia. EMS note: Patient was wheezing gave him 1 DuoNeb and route.  Patient was complaining of pain to his bilateral upper and lower extremity as well as back and neck and left foot.  He does have chronic pain and foot pain.  Denies any chest pain, shortness of breath, fever, palpitations, cough or urinary symptoms.  Assessment & Plan:   Principal Problem:   Generalized weakness Active Problems:   Fall at home, initial encounter   Hematoma of upper extremity (Left)   CAD (coronary artery disease)   COPD (chronic obstructive pulmonary disease) (HCC)   Hypercholesterolemia   Chronic foot pain (1ry area of Pain) (Left)   Alcohol  abuse   Pulmonary emboli (HCC)  # Cervical myelopathy S/p recent ACDF. MRI doesn't  appear to show any acute changes. Neurosurg has seen, advises no additional surgery - PT advising SNF, patient agreeable, TOC consulted - home prn dialudid  # PE, acute # History dvt Small recent PE, no hypoxia or signs right heart strain - continue apixaban   # Alcohol  abuse Admits to 4 drinks a day. No s/s withdrawal currently - vitamins - ciwa  # HTN Bp normalized - home acei - hold home hydrochlorothiazide  as below  # Hyponatremia Chronic. Home hydrochlorothiazide  may contribute. As may alcohol  abuse. Last dose 9/28 - hold hydrochlorothiazide   - will work up further with urine studies, serum osm, tsh, am cortisol - start salt tabs, fluid restrict  # COPD Stable - breo for home symbicort  # PAD - have discontinued home asa given recent start of apixaban    DVT prophylaxis: apixaban  Code Status: full Family Communication: sister updated telephonically 9/28, shared decision to give periodic updates unless status changes  Level of care: Med-Surg Status is: Observation    Consultants:  neurosurgery  Procedures: none  Antimicrobials:  none    Subjective: Reports feeling fine, pain controlled.  Breathing comfortably. NO COMPLAINTS  Objective: Vitals:   01/19/24 1609 01/19/24 1937 01/20/24 0431 01/20/24 0812  BP: 114/83 (!) 131/91 (!) 141/82 (!) 152/99  Pulse: 79 82 84 94  Resp: 18 17 18 18   Temp: 98 F (36.7 C) 98.2 F (36.8 C) 98.8 F (37.1 C) 99.2 F (37.3 C)  TempSrc: Oral  Oral  SpO2: 96% 97% 92% 93%  Weight:      Height:        Intake/Output Summary (Last 24 hours) at 01/20/2024 1258 Last data filed at 01/20/2024 0912 Gross per 24 hour  Intake 480 ml  Output --  Net 480 ml   Filed Weights   01/16/24 0722  Weight: 70.8 kg    Examination:  General exam: Appears calm and comfortable  Respiratory system: few scattered rhonchi, normal wob Cardiovascular system: S1 & S2 heard, RRR.   Gastrointestinal system: Abdomen is nondistended,  soft and nontender.  Central nervous system: Alert and oriented. Decreased strength 2/2 pain upper and lower extremities Extremities: Symmetric 5 x 5 power. Skin: bruising on arms, healing incision anterior neck c/d/i Psychiatry: calm    Data Reviewed: I have personally reviewed following labs and imaging studies  CBC: Recent Labs  Lab 01/15/24 2345  WBC 8.5  HGB 14.3  HCT 38.7*  MCV 94.6  PLT 242   Basic Metabolic Panel: Recent Labs  Lab 01/15/24 2345 01/17/24 0950 01/18/24 0402 01/19/24 0542 01/20/24 0354  NA 127* 131* 128* 129* 128*  K 3.3* 3.2* 3.2* 3.5 3.5  CL 86* 91* 91* 93* 93*  CO2 25 28 27 27 26   GLUCOSE 90 186* 94 110* 108*  BUN 9 9 13 15 22   CREATININE 0.56* 0.47* 0.45* 0.45* 0.64  CALCIUM  8.8* 9.0 8.9 8.8* 8.4*  MG  --   --  1.8  --   --    GFR: Estimated Creatinine Clearance: 94.6 mL/min (by C-G formula based on SCr of 0.64 mg/dL). Liver Function Tests: Recent Labs  Lab 01/15/24 2345  AST 36  ALT 24  ALKPHOS 69  BILITOT 0.7  PROT 6.8  ALBUMIN  3.5   No results for input(s): LIPASE, AMYLASE in the last 168 hours. No results for input(s): AMMONIA in the last 168 hours. Coagulation Profile: No results for input(s): INR, PROTIME in the last 168 hours.  Cardiac Enzymes: No results for input(s): CKTOTAL, CKMB, CKMBINDEX, TROPONINI in the last 168 hours. BNP (last 3 results) No results for input(s): PROBNP in the last 8760 hours. HbA1C: No results for input(s): HGBA1C in the last 72 hours. CBG: No results for input(s): GLUCAP in the last 168 hours. Lipid Profile: No results for input(s): CHOL, HDL, LDLCALC, TRIG, CHOLHDL, LDLDIRECT in the last 72 hours. Thyroid  Function Tests: No results for input(s): TSH, T4TOTAL, FREET4, T3FREE, THYROIDAB in the last 72 hours. Anemia Panel: No results for input(s): VITAMINB12, FOLATE, FERRITIN, TIBC, IRON, RETICCTPCT in the last 72 hours. Urine  analysis:    Component Value Date/Time   COLORURINE YELLOW (A) 01/16/2024 0720   APPEARANCEUR CLEAR (A) 01/16/2024 0720   LABSPEC 1.004 (L) 01/16/2024 0720   PHURINE 6.0 01/16/2024 0720   GLUCOSEU NEGATIVE 01/16/2024 0720   HGBUR NEGATIVE 01/16/2024 0720   BILIRUBINUR NEGATIVE 01/16/2024 0720   KETONESUR 5 (A) 01/16/2024 0720   PROTEINUR NEGATIVE 01/16/2024 0720   NITRITE NEGATIVE 01/16/2024 0720   LEUKOCYTESUR NEGATIVE 01/16/2024 0720   Sepsis Labs: @LABRCNTIP (procalcitonin:4,lacticidven:4)  ) Recent Results (from the past 240 hours)  Resp panel by RT-PCR (RSV, Flu A&B, Covid) Anterior Nasal Swab     Status: None   Collection Time: 01/16/24 10:10 AM   Specimen: Anterior Nasal Swab  Result Value Ref Range Status   SARS Coronavirus 2 by RT PCR NEGATIVE NEGATIVE Final    Comment: (NOTE) SARS-CoV-2 target nucleic acids are NOT DETECTED.  The SARS-CoV-2 RNA is generally detectable  in upper respiratory specimens during the acute phase of infection. The lowest concentration of SARS-CoV-2 viral copies this assay can detect is 138 copies/mL. A negative result does not preclude SARS-Cov-2 infection and should not be used as the sole basis for treatment or other patient management decisions. A negative result may occur with  improper specimen collection/handling, submission of specimen other than nasopharyngeal swab, presence of viral mutation(s) within the areas targeted by this assay, and inadequate number of viral copies(<138 copies/mL). A negative result must be combined with clinical observations, patient history, and epidemiological information. The expected result is Negative.  Fact Sheet for Patients:  BloggerCourse.com  Fact Sheet for Healthcare Providers:  SeriousBroker.it  This test is no t yet approved or cleared by the [place]  FDA and  has been authorized for detection and/or diagnosis of SARS-CoV-2 by FDA  under an Emergency Use Authorization (EUA). This EUA will remain  in effect (meaning this test can be used) for the duration of the COVID-19 declaration under Section 564(b)(1) of the Act, 21 U.S.C.section 360bbb-3(b)(1), unless the authorization is terminated  or revoked sooner.       Influenza A by PCR NEGATIVE NEGATIVE Final   Influenza B by PCR NEGATIVE NEGATIVE Final    Comment: (NOTE) The Xpert Xpress SARS-CoV-2/FLU/RSV plus assay is intended as an aid in the diagnosis of influenza from Nasopharyngeal swab specimens and should not be used as a sole basis for treatment. Nasal washings and aspirates are unacceptable for Xpert Xpress SARS-CoV-2/FLU/RSV testing.  Fact Sheet for Patients: BloggerCourse.com  Fact Sheet for Healthcare Providers: SeriousBroker.it  This test is not yet approved or cleared by the [place]  FDA and has been authorized for detection and/or diagnosis of SARS-CoV-2 by FDA under an Emergency Use Authorization (EUA). This EUA will remain in effect (meaning this test can be used) for the duration of the COVID-19 declaration under Section 564(b)(1) of the Act, 21 U.S.C. section 360bbb-3(b)(1), unless the authorization is terminated or revoked.     Resp Syncytial Virus by PCR NEGATIVE NEGATIVE Final    Comment: (NOTE) Fact Sheet for Patients: BloggerCourse.com  Fact Sheet for Healthcare Providers: SeriousBroker.it  This test is not yet approved or cleared by the [place]  FDA and has been authorized for detection and/or diagnosis of SARS-CoV-2 by FDA under an Emergency Use Authorization (EUA). This EUA will remain in effect (meaning this test can be used) for the duration of the COVID-19 declaration under Section 564(b)(1) of the Act, 21 U.S.C. section 360bbb-3(b)(1), unless the authorization is terminated or revoked.  Performed at [facility], [address] [place], [place] [address]          Radiology Studies: No results found.       Scheduled Meds:  apixaban   5 mg Oral BID   atorvastatin   40 mg Oral Daily   cloNIDine   0.1 mg Oral TID   feeding supplement  237 mL Oral BID BM   fluticasone  furoate-vilanterol  1 puff Inhalation Daily   folic acid   1 mg Oral Daily   lisinopril   20 mg Oral Daily   magnesium  oxide  800 mg Oral Daily   metoprolol  succinate  100 mg Oral Daily   multivitamin with minerals  1 tablet Oral Daily   polyethylene glycol  17 g Oral Daily   pregabalin   50 mg Oral BID   senna  1 tablet Oral Daily   sodium chloride   1 g Oral BID WC   thiamine   100  mg Oral Daily   Or   thiamine   100 mg Intravenous Daily   Continuous Infusions:   LOS: 0 days     Devaughn KATHEE Ban, MD [facility]   If 7PM-7AM, please contact night-coverage [email or web address] Password [email or web address] 01/20/2024, 12:58 PM

## 2024-01-20 NOTE — Progress Notes (Signed)
 Occupational Therapy Treatment Patient Details Name: Scott [name] MRN: [id number] DOB: 1960-08-13 Today's Date: 01/20/2024   History of present illness Pt is a 63 y.o. male presented to ED at [facility] with worsening weakness to his bilateral upper and lower extremities. He is s/p recent ACDF on 01/08/2024 for cervical spinal stenosis at C3-C4 level, recent admission after 4 days of that surgery after a fall with neuro follow up with no further intervention recommended. PMH of chronic foot pain, arthritis, COPD, DVT/PE on Eliquis , CAD, neck pain, HTN, PVD, AAA, GERD, anxiety, depression, etoh abuse.   OT comments  Pt making progress towards goals. Pt requires cues for cervical precaution adherence with poor recall and carryover, has difficulties with task sequencing requiring redirection and presents with significant BUE impairments in GMC/FMC. Pt requires MAX A to apply toothpaste to toothbrush, MAX A to clean eyeglasses and don, and MIN A to perform STS with max multimodal cues for hand placement, MAX A to perform UB bathing/dressing recliner level. Discharge recommendation appropriate, pt remains high falls risk due to deficits in balance, cognition, coordination and strength. OT will follow acutely.       If plan is discharge home, recommend the following:  A little help with walking and/or transfers;A lot of help with bathing/dressing/bathroom;Help with stairs or ramp for entrance;Assist for transportation;Assistance with cooking/housework   Equipment Recommendations  Other (comment)    Recommendations for Other Services      Precautions / Restrictions Precautions Precautions: Cervical;Fall Precaution Booklet Issued: No Recall of Precautions/Restrictions: Impaired Precaution/Restrictions Comments: recalls 0/3 cervical precautions, cannot implement during functional task performance Restrictions Weight Bearing Restrictions Per Provider Order: No       Mobility Bed Mobility Overal bed  mobility: Needs Assistance             General bed mobility comments: NT, in recliner pre and post session    Transfers Overall transfer level: Needs assistance Equipment used: Rolling walker (2 wheels) Transfers: Sit to/from Stand Sit to Stand: Min assist           General transfer comment: max mulimodal cues for technique; poor carryover and minA to rise into stand using RW for stability. pt pulling on RW multiple times requiring tactile cues to redirect     Balance Overall balance assessment: Needs assistance Sitting-balance support: Feet supported Sitting balance-Leahy Scale: Fair     Standing balance support: Bilateral upper extremity supported, Reliant on assistive device for balance Standing balance-Leahy Scale: Poor Standing balance comment: pt remains high fall risk                           ADL either performed or assessed with clinical judgement   ADL Overall ADL's : Needs assistance/impaired     Grooming: Wash/dry face;Sitting;Set up;Wash/dry hands;Oral care;Maximal assistance Grooming Details (indicate cue type and reason): recliner level; MAX A to wash face due to FMC/GMC impairments, MAX A to apply toothpaste on toothbrush, cues for adherence to cervical precautions with no carryover, requires assist to hold cup to spit Upper Body Bathing: Sitting;Minimal assistance Upper Body Bathing Details (indicate cue type and reason): recliner level, assist to wash back Lower Body Bathing: Maximal assistance;Sitting/lateral leans;Sit to/from stand   Upper Body Dressing : Maximal assistance Upper Body Dressing Details (indicate cue type and reason): doff/don gown                        Extremity/Trunk Assessment Upper  Extremity Assessment Upper Extremity Assessment:  (impaired GMC/FMC of BUE. trigger finger R, able to maintain functional gross grasp on toothbrush but question proprioceptive awarenesss of BUE; unable to grasp glasses)                      Communication Communication Communication: No apparent difficulties   Cognition Arousal: Alert Behavior During Therapy: WFL for tasks assessed/performed               OT - Cognition Comments: mildly impulsive, easily distracted but able to redirect with VC                 Following commands: Impaired Following commands impaired: Follows one step commands with increased time, Follows multi-step commands inconsistently      Cueing   Cueing Techniques: Verbal cues, Tactile cues        General Comments Condom cath fell off prior to session, gown noted to be wet with urine and pt unaware. RN notified. R hand with multiple soiled old bandages, OT removed and alerted RN.    Pertinent Vitals/ Pain       Pain Assessment Pain Assessment: Faces Faces Pain Scale: Hurts a little bit Pain Location: neck/hands Pain Descriptors / Indicators: Discomfort Pain Intervention(s): Limited activity within patient's tolerance, Monitored during session   Frequency  Min 2X/week        Progress Toward Goals  OT Goals(current goals can now be found in the care plan section)  Progress towards OT goals: Progressing toward goals  Acute Rehab OT Goals OT Goal Formulation: With patient Time For Goal Achievement: 01/30/24 Potential to Achieve Goals: Good ADL Goals Pt Will Perform Lower Body Bathing: with set-up;sit to/from stand;sitting/lateral leans Pt Will Perform Lower Body Dressing: with supervision;with set-up;sitting/lateral leans;sit to/from stand;with adaptive equipment Pt Will Transfer to Toilet: with supervision;ambulating;regular height toilet;with modified independence  Plan         AM-PAC OT 6 Clicks Daily Activity     Outcome Measure   Help from another person eating meals?: A Little Help from another person taking care of personal grooming?: A Little Help from another person toileting, which includes using toliet, bedpan, or urinal?: A Lot Help  from another person bathing (including washing, rinsing, drying)?: A Lot Help from another person to put on and taking off regular upper body clothing?: A Lot Help from another person to put on and taking off regular lower body clothing?: A Lot 6 Click Score: 14    End of Session Equipment Utilized During Treatment: Rolling walker (2 wheels)  OT Visit Diagnosis: Other abnormalities of gait and mobility (R26.89);Muscle weakness (generalized) (M62.81)   Activity Tolerance Patient tolerated treatment well   Patient Left in chair;with call bell/phone within reach;with chair alarm set   Nurse Communication Mobility status        Time: 8844-8774 OT Time Calculation (min): 30 min  Charges: OT Treatments $Self Care/Home Management : 23-37 mins  Camylle Whicker L. Flara Storti, OTR/L  01/20/24, 1:16 PM

## 2024-01-20 NOTE — Progress Notes (Signed)
 Physical Therapy Treatment Patient Details Name: Scott [name] MRN: [id number] DOB: 10-21-1960 Today's Date: 01/20/2024   History of Present Illness Pt is a 63 y.o. male presented to ED at [facility] with worsening weakness to his bilateral upper and lower extremities. He is s/p recent ACDF on 01/08/2024 for cervical spinal stenosis at C3-C4 level, recent admission after 4 days of that surgery after a fall with neuro follow up with no further intervention recommended. PMH of chronic foot pain, arthritis, COPD, DVT/PE on Eliquis , CAD, neck pain, HTN, PVD, AAA, GERD, anxiety, depression, etoh abuse.    PT Comments  Pt was sitting in recliner upon arrival. He agrees to session but does endorse BUE/BLE weakness. Pt is oriented x 3 and still agreeable to STR at DC. Pt required assistance to stand and ambulate with RW. He is very unsteady on feet and remains high fall risk. Pt needs almost constant assistance in standing to prevent falling. He struggles to use BUEs to maintain grasp on RW and to be able to use standard call bell. Author offered pt a different (pancake) call bell but he declined use. Overall, pt remains far form his baseline and will continue to benefit form skilled PT at DC to maximize independence and safety with all ADLs.    If plan is discharge home, recommend the following: A lot of help with walking and/or transfers;A lot of help with bathing/dressing/bathroom;Assistance with cooking/housework;Assist for transportation     Equipment Recommendations  Other (comment) (Defer to next level of care)       Precautions / Restrictions Precautions Precautions: Cervical;Fall Precaution Booklet Issued: No Recall of Precautions/Restrictions: Impaired Restrictions Weight Bearing Restrictions Per Provider Order: No     Mobility  Bed Mobility  General bed mobility comments: In recliner pre session, on BSC post session    Transfers Overall transfer level: Needs assistance Equipment used:  Rolling walker (2 wheels) Transfers: Sit to/from Stand Sit to Stand: Min assist, Contact guard assist  General transfer comment: VCs for technique and sequencing improvements    Ambulation/Gait Ambulation/Gait assistance: Min assist Gait Distance (Feet): 60 Feet Assistive device: Rolling walker (2 wheels) Gait Pattern/deviations: Scissoring, Narrow base of support, Trunk flexed, Step-through pattern, Decreased stride length Gait velocity: decreased  General Gait Details: Pt was able to ambulate ~ 60 ft however poor gait kinematics. pt is at high risk of falls. distance limited by pt needing to have BM. Constant vcs for wider BOS and preventing scissoring during turns.    Balance Overall balance assessment: Needs assistance Sitting-balance support: Feet supported Sitting balance-Leahy Scale: Fair     Standing balance support: Bilateral upper extremity supported, Reliant on assistive device for balance Standing balance-Leahy Scale: Poor Standing balance comment: pt remains high fall risk       Communication Communication Communication: No apparent difficulties  Cognition Arousal: Alert Behavior During Therapy: WFL for tasks assessed/performed   PT - Cognitive impairments: No apparent impairments    PT - Cognition Comments: Pt is A and O x3. does continue to endorse wanting to DC to SNF local to [place] Following commands: Impaired Following commands impaired: Follows one step commands with increased time, Follows multi-step commands inconsistently    Cueing Cueing Techniques: Verbal cues, Tactile cues         Pertinent Vitals/Pain Pain Assessment Pain Assessment: 0-10 Pain Score: 3  Pain Location: neck/hands; bilateral plantar warts Pain Descriptors / Indicators: Discomfort Pain Intervention(s): Limited activity within patient's tolerance, Monitored during session, Premedicated before session, Repositioned  PT Goals (current goals can now be found in the care plan  section) Acute Rehab PT Goals Patient Stated Goal: none stated Progress towards PT goals: Progressing toward goals    Frequency    Min 2X/week       AM-PAC PT 6 Clicks Mobility   Outcome Measure  Help needed turning from your back to your side while in a flat bed without using bedrails?: None Help needed moving from lying on your back to sitting on the side of a flat bed without using bedrails?: A Little Help needed moving to and from a bed to a chair (including a wheelchair)?: A Little Help needed standing up from a chair using your arms (e.g., wheelchair or bedside chair)?: A Little Help needed to walk in hospital room?: A Little Help needed climbing 3-5 steps with a railing? : Total 6 Click Score: 17    End of Session   Activity Tolerance: Patient tolerated treatment well;Other (comment) (limited by [facility]) Patient left: in chair;with call bell/phone within reach;with nursing/sitter in room;with family/visitor present Nurse Communication: Mobility status PT Visit Diagnosis: Unsteadiness on feet (R26.81);Other abnormalities of gait and mobility (R26.89);History of falling (Z91.81);Difficulty in walking, not elsewhere classified (R26.2);Pain     Time: 9074-9060 PT Time Calculation (min) (ACUTE ONLY): 14 min  Charges:    $Gait Training: 8-22 mins PT General Charges $$ ACUTE PT VISIT: 1 Visit                     Rankin Essex PTA 01/20/24, 12:42 PM

## 2024-01-21 ENCOUNTER — Telehealth: Payer: Self-pay | Admitting: Neurosurgery

## 2024-01-21 DIAGNOSIS — G959 Disease of spinal cord, unspecified: Secondary | ICD-10-CM

## 2024-01-21 DIAGNOSIS — R531 Weakness: Secondary | ICD-10-CM | POA: Diagnosis not present

## 2024-01-21 LAB — CORTISOL-AM, BLOOD: Cortisol - AM: 14.9 ug/dL (ref 6.7–22.6)

## 2024-01-21 LAB — BASIC METABOLIC PANEL WITH GFR
Anion gap: 11 (ref 5–15)
BUN: 18 mg/dL (ref 8–23)
CO2: 25 mmol/L (ref 22–32)
Calcium: 8.5 mg/dL — ABNORMAL LOW (ref 8.9–10.3)
Chloride: 95 mmol/L — ABNORMAL LOW (ref 98–111)
Creatinine, Ser: 0.59 mg/dL — ABNORMAL LOW (ref 0.61–1.24)
GFR, Estimated: >60 mL/min (ref >60)
Glucose, Bld: 103 mg/dL — ABNORMAL HIGH (ref 70–99)
Potassium: 4 mmol/L (ref 3.5–5.1)
Sodium: 131 mmol/L — ABNORMAL LOW (ref 135–145)

## 2024-01-21 NOTE — Progress Notes (Signed)
 Mobility Specialist - Progress Note   01/21/24 1556  Mobility  Activity Placed arms in swimmer's position - right arm up  Level of Assistance Independent  Range of Motion/Exercises Right arm;Left leg;Right leg;Active Assistive  Activity Response Tolerated fair  Mobility visit 1 Mobility  Mobility Specialist Start Time (ACUTE ONLY) 1424  Mobility Specialist Stop Time (ACUTE ONLY) 1443  Mobility Specialist Time Calculation (min) (ACUTE ONLY) 19 min     Pt was in the recliner upon entry. Pt agreed to mobility. Pt did state that as the day goes on body becomes stiff. Pt was able to do ROM activities with all extremities. Left arm is restrictive. After activity pt repositioned in recliner with all needs in reach.  Clem Rodes Mobility Specialist 01/21/24, 4:05 PM

## 2024-01-21 NOTE — Plan of Care (Signed)

## 2024-01-21 NOTE — Progress Notes (Addendum)
 PROGRESS NOTE    Scott [name]  FMW:[id number] DOB: 02-Mar-1961 DOA: 01/16/2024 PCP: Everlene Parris LABOR, MD    Assessment & Plan:   Principal Problem:   Generalized weakness Active Problems:   Fall at home, initial encounter   Hematoma of upper extremity (Left)   CAD (coronary artery disease)   COPD (chronic obstructive pulmonary disease) (HCC)   Hypercholesterolemia   Chronic foot pain (1ry area of Pain) (Left)   Alcohol  abuse   Pulmonary emboli (HCC)  Assessment and Plan: Cervical myelopathy: s/p recent ACDF. MRI doesn't appear to show any acute changes. PT/OT recs SNF. Waiting on insurance auth. Will need to f/u outpatient w/ neuro surg at previously scheduled appointment.   Generalized weakness: PT/OT recs SNF   Recent PE: continue on home dose of eliquis . Hx of DVT  Alcohol  abuse: drinks 4 alcoholic beverages a day. Continue CIWA protocol    HTN: continue on lisinopril , clonidine . Holding home dose of HCTZ  Hyponatremia: chronic. trending up. Pt does not want daily labs, so no labs tomorrow. Holding home hydrochlorothiazide . Continue on NaCl tabs.    COPD: w/o exacerbation. Continue on bronchodilators    PAD: continue on home dose of eliquis . No longer on aspirin        DVT prophylaxis: eliquis  Code Status: full  Family Communication:  Disposition Plan: waiting on insurance auth for SNF  Level of care: Med-Surg  Status is: Observation The patient remains OBS appropriate and will d/c before 2 midnights.    Consultants:  Neuro surg   Procedures:  Antimicrobials:   Subjective: Pt c/o about still being in the hospital.  Objective: Vitals:   01/20/24 1624 01/20/24 2012 01/21/24 0309 01/21/24 0804  BP: (!) 136/94 118/81 (!) 150/97 (!) 156/97  Pulse: 86 82 84 83  Resp:    18  Temp:  98 F (36.7 C) 98.2 F (36.8 C) 98.2 F (36.8 C)  TempSrc:  Oral Oral Oral  SpO2:  97% (!) 89% 90%  Weight:      Height:        Intake/Output Summary (Last 24  hours) at 01/21/2024 0946 Last data filed at 01/20/2024 1900 Gross per 24 hour  Intake 720 ml  Output --  Net 720 ml   Filed Weights   01/16/24 0722  Weight: 70.8 kg    Examination:  General exam: Appears agitated and frustrated Respiratory system: decreased breath sounds b/l. Intermittent wheezes Cardiovascular system: S1 & S2+ No rubs, gallops or clicks. Gastrointestinal system: Abdomen is nondistended, soft and nontender. Normal bowel sounds heard. Central nervous system: Alert and oriented. Moves all extremities Psychiatry: Judgement and insight appears at baseline. Agitated & frustrated mood and affect   Data Reviewed: I have personally reviewed following labs and imaging studies  CBC: Recent Labs  Lab 01/15/24 2345  WBC 8.5  HGB 14.3  HCT 38.7*  MCV 94.6  PLT 242   Basic Metabolic Panel: Recent Labs  Lab 01/17/24 0950 01/18/24 0402 01/19/24 0542 01/20/24 0354 01/21/24 0850  NA 131* 128* 129* 128* 131*  K 3.2* 3.2* 3.5 3.5 4.0  CL 91* 91* 93* 93* 95*  CO2 28 27 27 26 25   GLUCOSE 186* 94 110* 108* 103*  BUN 9 13 15 22 18   CREATININE 0.47* 0.45* 0.45* 0.64 0.59*  CALCIUM  9.0 8.9 8.8* 8.4* 8.5*  MG  --  1.8  --   --   --    GFR: Estimated Creatinine Clearance: 94.6 mL/min (A) (by C-G formula based on  SCr of 0.59 mg/dL (L)). Liver Function Tests: Recent Labs  Lab 01/15/24 2345  AST 36  ALT 24  ALKPHOS 69  BILITOT 0.7  PROT 6.8  ALBUMIN  3.5   No results for input(s): LIPASE, AMYLASE in the last 168 hours. No results for input(s): AMMONIA in the last 168 hours. Coagulation Profile: No results for input(s): INR, PROTIME in the last 168 hours. Cardiac Enzymes: No results for input(s): CKTOTAL, CKMB, CKMBINDEX, TROPONINI in the last 168 hours. BNP (last 3 results) No results for input(s): PROBNP in the last 8760 hours. HbA1C: No results for input(s): HGBA1C in the last 72 hours. CBG: No results for input(s): GLUCAP in the  last 168 hours. Lipid Profile: No results for input(s): CHOL, HDL, LDLCALC, TRIG, CHOLHDL, LDLDIRECT in the last 72 hours. Thyroid  Function Tests: Recent Labs    01/20/24 0354  TSH 1.448   Anemia Panel: No results for input(s): VITAMINB12, FOLATE, FERRITIN, TIBC, IRON, RETICCTPCT in the last 72 hours. Sepsis Labs: No results for input(s): PROCALCITON, LATICACIDVEN in the last 168 hours.  Recent Results (from the past 240 hours)  Resp panel by RT-PCR (RSV, Flu A&B, Covid) Anterior Nasal Swab     Status: None   Collection Time: 01/16/24 10:10 AM   Specimen: Anterior Nasal Swab  Result Value Ref Range Status   SARS Coronavirus 2 by RT PCR NEGATIVE NEGATIVE Final    Comment: (NOTE) SARS-CoV-2 target nucleic acids are NOT DETECTED.  The SARS-CoV-2 RNA is generally detectable in upper respiratory specimens during the acute phase of infection. The lowest concentration of SARS-CoV-2 viral copies this assay can detect is 138 copies/mL. A negative result does not preclude SARS-Cov-2 infection and should not be used as the sole basis for treatment or other patient management decisions. A negative result may occur with  improper specimen collection/handling, submission of specimen other than nasopharyngeal swab, presence of viral mutation(s) within the areas targeted by this assay, and inadequate number of viral copies(<138 copies/mL). A negative result must be combined with clinical observations, patient history, and epidemiological information. The expected result is Negative.  Fact Sheet for Patients:  BloggerCourse.com  Fact Sheet for Healthcare Providers:  SeriousBroker.it  This test is no t yet approved or cleared by the [place]  FDA and  has been authorized for detection and/or diagnosis of SARS-CoV-2 by FDA under an Emergency Use Authorization (EUA). This EUA will remain  in effect (meaning this  test can be used) for the duration of the COVID-19 declaration under Section 564(b)(1) of the Act, 21 U.S.C.section 360bbb-3(b)(1), unless the authorization is terminated  or revoked sooner.       Influenza A by PCR NEGATIVE NEGATIVE Final   Influenza B by PCR NEGATIVE NEGATIVE Final    Comment: (NOTE) The Xpert Xpress SARS-CoV-2/FLU/RSV plus assay is intended as an aid in the diagnosis of influenza from Nasopharyngeal swab specimens and should not be used as a sole basis for treatment. Nasal washings and aspirates are unacceptable for Xpert Xpress SARS-CoV-2/FLU/RSV testing.  Fact Sheet for Patients: BloggerCourse.com  Fact Sheet for Healthcare Providers: SeriousBroker.it  This test is not yet approved or cleared by the [place]  FDA and has been authorized for detection and/or diagnosis of SARS-CoV-2 by FDA under an Emergency Use Authorization (EUA). This EUA will remain in effect (meaning this test can be used) for the duration of the COVID-19 declaration under Section 564(b)(1) of the Act, 21 U.S.C. section 360bbb-3(b)(1), unless the authorization is terminated or revoked.  Resp Syncytial Virus by PCR NEGATIVE NEGATIVE Final    Comment: (NOTE) Fact Sheet for Patients: BloggerCourse.com  Fact Sheet for Healthcare Providers: SeriousBroker.it  This test is not yet approved or cleared by the [place]  FDA and has been authorized for detection and/or diagnosis of SARS-CoV-2 by FDA under an Emergency Use Authorization (EUA). This EUA will remain in effect (meaning this test can be used) for the duration of the COVID-19 declaration under Section 564(b)(1) of the Act, 21 U.S.C. section 360bbb-3(b)(1), unless the authorization is terminated or revoked.  Performed at [facility], [address], [place], [place] [address]          Radiology  Studies: No results found.      Scheduled Meds:  apixaban   5 mg Oral BID   atorvastatin   40 mg Oral Daily   cloNIDine   0.1 mg Oral TID   feeding supplement  237 mL Oral BID BM   fluticasone  furoate-vilanterol  1 puff Inhalation Daily   folic acid   1 mg Oral Daily   lisinopril   20 mg Oral Daily   magnesium  oxide  800 mg Oral Daily   metoprolol  succinate  100 mg Oral Daily   multivitamin with minerals  1 tablet Oral Daily   polyethylene glycol  17 g Oral Daily   pregabalin   50 mg Oral BID   senna  1 tablet Oral Daily   sodium chloride   1 g Oral BID WC   thiamine   100 mg Oral Daily   Or   thiamine   100 mg Intravenous Daily   Continuous Infusions:   LOS: 0 days        Anthony CHRISTELLA Pouch, MD [facility] Pager [phone]  If 7PM-7AM, please contact night-coverage [email or web address] 01/21/2024, 9:46 AM

## 2024-01-21 NOTE — TOC Progression Note (Addendum)
 Transition of Care [facility]) - Progression Note    Patient Details  Name: Scott [name] MRN: [id number] Date of Birth: 14-Aug-1960  Transition of Care [facility]) CM/SW Contact  Dalia GORMAN Fuse, RN Phone Number: 01/21/2024, 11:02 AM  Clinical Narrative:     RN, CM spoke with the patient and advised he has a bed offer from [facility] in [place]. The patient expressed that he would prefer a facility that is closer, but understands that [facility] is his only option. Nitchia to start ins auth.   Pending Certification#[id number]                  Expected Discharge Plan and Services                                               Social Drivers of Health (SDOH) Interventions SDOH Screenings   Food Insecurity: No Food Insecurity (01/16/2024)  Housing: Low Risk  (01/16/2024)  Transportation Needs: No Transportation Needs (01/16/2024)  Utilities: Not At Risk (01/16/2024)  Depression (PHQ2-9): Medium Risk (10/30/2023)  Financial Resource Strain: High Risk (10/07/2023)   Received from [facility]  Physical Activity: Inactive (10/07/2023)   Received from [facility]  Social Connections: Socially Isolated (01/16/2024)  Stress: Stress Concern Present (10/07/2023)   Received from [facility]  Tobacco Use: High Risk (01/16/2024)  Health Literacy: Inadequate Health Literacy (10/07/2023)   Received from [facility]    Readmission Risk Interventions     No data to display

## 2024-01-21 NOTE — Progress Notes (Signed)
 Physical Therapy Treatment Patient Details Name: Scott [name] MRN: [id number] DOB: 01-27-1961 Today's Date: 01/21/2024   History of Present Illness Pt is a 63 y.o. male presented to ED at [facility] with worsening weakness to his bilateral upper and lower extremities. He is s/p recent ACDF on 01/08/2024 for cervical spinal stenosis at C3-C4 level, recent admission after 4 days of that surgery after a fall with neuro follow up with no further intervention recommended. PMH of chronic foot pain, arthritis, COPD, DVT/PE on Eliquis , CAD, neck pain, HTN, PVD, AAA, GERD, anxiety, depression, etoh abuse.    PT Comments  Pt alert and agreeable to PT tx. Pt received in recliner, promptly stating he felt like he needed to have a BM. MinA STS from recliner/toilet with max multimodal cues for sequencing. Pt required minA during amb of ~128ft with max VC for wide BOS throughout and RW positioning to maintain contact with ground. Pt very impulsive with mobility and demonstrates poor carryover of education. Pt was left seated in recliner at end of session, all needs in reach. Pt would benefit from further skilled PT intervention to continue progress toward goals.    If plan is discharge home, recommend the following: A lot of help with walking and/or transfers;A lot of help with bathing/dressing/bathroom;Assistance with cooking/housework;Assist for transportation   Can travel by private vehicle     Yes  Equipment Recommendations  Other (comment) (TBD at next venue of care)    Recommendations for Other Services       Precautions / Restrictions Precautions Precautions: Cervical;Fall Precaution Booklet Issued: No Recall of Precautions/Restrictions: Impaired Restrictions Weight Bearing Restrictions Per Provider Order: No     Mobility  Bed Mobility               General bed mobility comments: NT, in recliner pre and post session    Transfers Overall transfer level: Needs assistance Equipment used:  Rolling walker (2 wheels) Transfers: Sit to/from Stand Sit to Stand: Min assist           General transfer comment: STS from recliner and toilet with minA, max multimodal cues for sequencing and proper hand placement    Ambulation/Gait Ambulation/Gait assistance: Min assist Gait Distance (Feet): 100 Feet Assistive device: Rolling walker (2 wheels) Gait Pattern/deviations: Scissoring, Narrow base of support, Trunk flexed, Step-through pattern, Decreased stride length       General Gait Details: No LOB, required minA to maintain RW positioned appropriately, ocassional scissoring worse with turns, max VC throughout for wide BOS   Stairs             Wheelchair Mobility     Tilt Bed    Modified Rankin (Stroke Patients Only)       Balance Overall balance assessment: Needs assistance Sitting-balance support: Feet supported Sitting balance-Leahy Scale: Fair     Standing balance support: Bilateral upper extremity supported Standing balance-Leahy Scale: Poor Standing balance comment: high fall risk, unsteady without BUE support                            Communication Communication Communication: No apparent difficulties  Cognition Arousal: Alert Behavior During Therapy: WFL for tasks assessed/performed, Impulsive   PT - Cognitive impairments: No apparent impairments                       PT - Cognition Comments: A&Ox3, impulsive, requires freqeunt redirection to task Following commands: Impaired Following commands  impaired: Follows one step commands with increased time, Follows multi-step commands inconsistently    Cueing Cueing Techniques: Verbal cues, Visual cues  Exercises      General Comments        Pertinent Vitals/Pain Pain Assessment Pain Assessment: No/denies pain    Home Living                          Prior Function            PT Goals (current goals can now be found in the care plan section) Progress  towards PT goals: Progressing toward goals    Frequency    Min 2X/week      PT Plan      Co-evaluation              AM-PAC PT 6 Clicks Mobility   Outcome Measure  Help needed turning from your back to your side while in a flat bed without using bedrails?: None Help needed moving from lying on your back to sitting on the side of a flat bed without using bedrails?: A Little Help needed moving to and from a bed to a chair (including a wheelchair)?: A Little Help needed standing up from a chair using your arms (e.g., wheelchair or bedside chair)?: A Little Help needed to walk in hospital room?: A Little Help needed climbing 3-5 steps with a railing? : Total 6 Click Score: 17    End of Session Equipment Utilized During Treatment: Gait belt Activity Tolerance: Patient tolerated treatment well Patient left: in chair;with call bell/phone within reach;with chair alarm set Nurse Communication: Mobility status PT Visit Diagnosis: Unsteadiness on feet (R26.81);Other abnormalities of gait and mobility (R26.89);History of falling (Z91.81);Difficulty in walking, not elsewhere classified (R26.2);Pain     Time: 8577-8557 PT Time Calculation (min) (ACUTE ONLY): 20 min  Charges:                           Rankin Essex PTA 01/21/24, 2:56 PM

## 2024-01-22 ENCOUNTER — Telehealth: Payer: Self-pay

## 2024-01-22 DIAGNOSIS — R531 Weakness: Secondary | ICD-10-CM | POA: Diagnosis not present

## 2024-01-22 DIAGNOSIS — G959 Disease of spinal cord, unspecified: Secondary | ICD-10-CM | POA: Diagnosis not present

## 2024-01-22 NOTE — Plan of Care (Signed)

## 2024-01-22 NOTE — Progress Notes (Signed)
 Pt complained of 8/10 neck pain. Gave 2mg  of Dilaudid  per patient request.

## 2024-01-22 NOTE — Progress Notes (Addendum)
 Physical Therapy Treatment Patient Details Name: Scott [name] MRN: [id number] DOB: 03-29-1961 Today's Date: 01/22/2024   History of Present Illness Pt is a 63 y.o. male presented to ED at [facility] with worsening weakness to his bilateral upper and lower extremities. He is s/p recent ACDF on 01/08/2024 for cervical spinal stenosis at C3-C4 level, recent admission after 4 days of that surgery after a fall with neuro follow up with no further intervention recommended. PMH of chronic foot pain, arthritis, COPD, DVT/PE on Eliquis , CAD, neck pain, HTN, PVD, AAA, GERD, anxiety, depression, etoh abuse.    PT Comments  Pt was seated EOB upon arrival. He is A and O x 3. Does endorse feeling better today than yesterday however he did not want to ambulate far due to c/o R side/rib pain. I don't feel like Im breathing good today. Pt was able to stand and tolerate gait short distance. Ill walk more after breakfast. Pt does remain at high risk of falls and has poor carry-over between sessions. Pt tends to have narrow BOS and occasional scissoring with turns. BUE strength remains limited but did not lose grip of RW during ambulation like previous date. Pt remains far from his baseline abilities and continues to require assistance with all ADLs for safety. DC recs remain appropriate to maximize independence and safety with all ADLs.    If plan is discharge home, recommend the following: A lot of help with walking and/or transfers;A lot of help with bathing/dressing/bathroom;Assistance with cooking/housework;Assist for transportation     Equipment Recommendations  Other (comment) (Defer to next level of care)       Precautions / Restrictions Precautions Precautions: Cervical;Fall Precaution Booklet Issued: No Recall of Precautions/Restrictions: Impaired Restrictions Weight Bearing Restrictions Per Provider Order: No     Mobility  Bed Mobility  General bed mobility comments: Pt was seated EOB upon arrival.     Transfers Overall transfer level: Needs assistance Equipment used: Rolling walker (2 wheels) Transfers: Sit to/from Stand Sit to Stand: Min assist  General transfer comment: Min assist to stand from lowest bed height with alot of VCs for handplacement, and safer technique. pt is slightly impulsive.    Ambulation/Gait Ambulation/Gait assistance: Contact guard assist Gait Distance (Feet): 25 Feet Assistive device: Rolling walker (2 wheels) Gait Pattern/deviations: Scissoring, Narrow base of support, Trunk flexed, Step-through pattern, Decreased stride length Gait velocity: decreased General Gait Details: Pt continues to be a high fall risk with poor carryover between sessions. With vcs is able to keep wider BOS but requires constant cues   Balance Overall balance assessment: Needs assistance Sitting-balance support: Feet supported Sitting balance-Leahy Scale: Fair     Standing balance support: Bilateral upper extremity supported Standing balance-Leahy Scale: Poor Standing balance comment: high fall risk, unsteady without BUE support       Communication Communication Communication: No apparent difficulties  Cognition Arousal: Alert Behavior During Therapy: WFL for tasks assessed/performed, Impulsive   PT - Cognitive impairments: No apparent impairments      PT - Cognition Comments: A&Ox3, impulsive, requires freqeunt redirection to task Following commands: Impaired Following commands impaired: Follows one step commands with increased time, Follows multi-step commands inconsistently    Cueing Cueing Techniques: Verbal cues, Tactile cues         Pertinent Vitals/Pain Pain Assessment Pain Assessment: No/denies pain     PT Goals (current goals can now be found in the care plan section) Acute Rehab PT Goals Patient Stated Goal: none stated Progress towards PT goals: Progressing  toward goals    Frequency    Min 2X/week       AM-PAC PT 6 Clicks Mobility    Outcome Measure  Help needed turning from your back to your side while in a flat bed without using bedrails?: None Help needed moving from lying on your back to sitting on the side of a flat bed without using bedrails?: A Little Help needed moving to and from a bed to a chair (including a wheelchair)?: A Little Help needed standing up from a chair using your arms (e.g., wheelchair or bedside chair)?: A Little Help needed to walk in hospital room?: A Little Help needed climbing 3-5 steps with a railing? : A Lot 6 Click Score: 18    End of Session   Activity Tolerance: Patient tolerated treatment well Patient left: in chair;with call bell/phone within reach;with chair alarm set Nurse Communication: Mobility status PT Visit Diagnosis: Unsteadiness on feet (R26.81);Other abnormalities of gait and mobility (R26.89);History of falling (Z91.81);Difficulty in walking, not elsewhere classified (R26.2);Pain     Time: 9252-9198 PT Time Calculation (min) (ACUTE ONLY): 14 min  Charges:    $Gait Training: 8-22 mins PT General Charges $$ ACUTE PT VISIT: 1 Visit                    Rankin Essex PTA 01/22/24, 1:40 PM

## 2024-01-22 NOTE — Telephone Encounter (Signed)
 Tried calling patient no answer or VM not set up at this time.

## 2024-01-22 NOTE — Telephone Encounter (Signed)
 Spoke with patient, notified we can not assist in where he is going for rehab or going home. He understands verbally

## 2024-01-22 NOTE — Progress Notes (Signed)
 Went in to check on patient he requested wash clothes and extra blankets for tonight. I provided everything he requested. I asked patient if he was ready to get back in bed he stated he wanted to stay sitting I the chair that he stays in bed all night. Call bell in reach patient verbalized he didn't need anything else at this time.

## 2024-01-22 NOTE — Telephone Encounter (Signed)
 Copied from CRM [id number]. Topic: Clinical - Home Health Verbal Orders >> Jan 22, 2024  9:34 AM Darshell M wrote: Reason for CRM: [facility] Concerned hospital is not clean and he is not being taken care of. Plan of care is to transport patient to [facility] for rehab but he does not want to go due to the distance from his home. Would like to see if provider  will give orders for a nurse to his home for 4 per hours today. Edouard Gikas [phone], Lorretta (sister) [phone]. Patient wants provider to know he stopped smoking and drinking

## 2024-01-22 NOTE — Progress Notes (Signed)
 Went in and gave the patient Clonidine  and his sodium chloride  tablet. Asked the patient if he needed anything he declined. I asked if he would like to get back in bed and he declined, he stated he may have to use the bathroom in a little while but he didn't want to go at this time, I told him to press the call bell when he had to go. Call bell placed within reach.

## 2024-01-22 NOTE — Progress Notes (Signed)
 Went into patients room this morning to him sitting on the edge of the bed stating he was having trouble breathing. Me and another nurse helped him by putting on 2L nasal cannula and Guided through taking deep breaths. I then went and got him his PRN Duoneb and hooked that up for the patient. After the Duoneb was completed patient sounded better and was breathing good.

## 2024-01-22 NOTE — Progress Notes (Signed)
 Zanaflex  given per patient request.

## 2024-01-22 NOTE — Progress Notes (Signed)
 Patient was asking about his muscle relaxant, I went into his room and let him know that I gave it this morning at 1054 and it could be given every 8 hours and that it wasn't due yet. Patient was accepting. I asked him if he needed anything else and he declined. Call bell in reach.

## 2024-01-22 NOTE — Progress Notes (Signed)
 PROGRESS NOTE    Scott [name]  FMW:[id number] DOB: 08/18/1960 DOA: 01/16/2024 PCP: Everlene Parris LABOR, MD    Assessment & Plan:   Principal Problem:   Generalized weakness Active Problems:   Fall at home, initial encounter   Hematoma of upper extremity (Left)   CAD (coronary artery disease)   COPD (chronic obstructive pulmonary disease) (HCC)   Hypercholesterolemia   Chronic foot pain (1ry area of Pain) (Left)   Alcohol  abuse   Pulmonary emboli (HCC)  Assessment and Plan: Cervical myelopathy: s/p recent ACDF. MRI doesn't appear to show any acute changes. PT/OT recs SNF. Still waiting on insurance auth. Will need to f/u outpatient w/ neuro surg at previously scheduled appointment.   Generalized weakness: PT/OT recs SNF   Recent PE: continue on home dose of eliquis . Hx of DVT  Alcohol  abuse: drinks 4 alcoholic beverages a day. Continue on CIWA protocol    HTN: continue on clonidine , lisinopril . Holding home dose of HCTZ  continue on lisinopril , clonidine . Holding home dose of HCT  Hyponatremia: chronic. trending up. Will check Na level in AM. Holding home hydrochlorothiazide . Continue on NaCl tabs   COPD: w/o exacerbation. Continue on bronchodilators     PAD: continue on home dose of eliquis . No longer on aspirin        DVT prophylaxis: eliquis  Code Status: full  Family Communication:  Disposition Plan: medically stable.waiting on insurance auth for SNF  Level of care: Med-Surg  Status is: Observation The patient remains OBS appropriate and will d/c before 2 midnights.    Consultants:  Neuro surg   Procedures:  Antimicrobials:   Subjective: Pt c/o weakness  Objective: Vitals:   01/21/24 1641 01/21/24 1935 01/22/24 0127 01/22/24 0452  BP: (!) 134/95 (!) 175/96 107/72 125/82  Pulse: 88 87 79 81  Resp: 20 19 19 18   Temp: 98.3 F (36.8 C) 98.7 F (37.1 C) 99.2 F (37.3 C) 99.2 F (37.3 C)  TempSrc: Oral Oral Oral Oral  SpO2: 96% 96% 90% 91%   Weight:      Height:        Intake/Output Summary (Last 24 hours) at 01/22/2024 0759 Last data filed at 01/21/2024 2133 Gross per 24 hour  Intake 720 ml  Output 400 ml  Net 320 ml   Filed Weights   01/16/24 0722  Weight: 70.8 kg    Examination:  General exam: appears comfortable  Respiratory system: diminished breath sounds b/l. Cardiovascular system: S1/S2+. No rubs or clicks  Gastrointestinal system: Abd is soft, NT, ND & hypoactive bowel sounds Central nervous system: alert & awake.  Psychiatry: judgement and insight appears not at baseline. Flat mood and affect    Data Reviewed: I have personally reviewed following labs and imaging studies  CBC: Recent Labs  Lab 01/15/24 2345  WBC 8.5  HGB 14.3  HCT 38.7*  MCV 94.6  PLT 242   Basic Metabolic Panel: Recent Labs  Lab 01/17/24 0950 01/18/24 0402 01/19/24 0542 01/20/24 0354 01/21/24 0850  NA 131* 128* 129* 128* 131*  K 3.2* 3.2* 3.5 3.5 4.0  CL 91* 91* 93* 93* 95*  CO2 28 27 27 26 25   GLUCOSE 186* 94 110* 108* 103*  BUN 9 13 15 22 18   CREATININE 0.47* 0.45* 0.45* 0.64 0.59*  CALCIUM  9.0 8.9 8.8* 8.4* 8.5*  MG  --  1.8  --   --   --    GFR: Estimated Creatinine Clearance: 94.6 mL/min (A) (by C-G formula based on SCr of  0.59 mg/dL (L)). Liver Function Tests: Recent Labs  Lab 01/15/24 2345  AST 36  ALT 24  ALKPHOS 69  BILITOT 0.7  PROT 6.8  ALBUMIN  3.5   No results for input(s): LIPASE, AMYLASE in the last 168 hours. No results for input(s): AMMONIA in the last 168 hours. Coagulation Profile: No results for input(s): INR, PROTIME in the last 168 hours. Cardiac Enzymes: No results for input(s): CKTOTAL, CKMB, CKMBINDEX, TROPONINI in the last 168 hours. BNP (last 3 results) No results for input(s): PROBNP in the last 8760 hours. HbA1C: No results for input(s): HGBA1C in the last 72 hours. CBG: No results for input(s): GLUCAP in the last 168 hours. Lipid Profile: No  results for input(s): CHOL, HDL, LDLCALC, TRIG, CHOLHDL, LDLDIRECT in the last 72 hours. Thyroid  Function Tests: Recent Labs    01/20/24 0354  TSH 1.448   Anemia Panel: No results for input(s): VITAMINB12, FOLATE, FERRITIN, TIBC, IRON, RETICCTPCT in the last 72 hours. Sepsis Labs: No results for input(s): PROCALCITON, LATICACIDVEN in the last 168 hours.  Recent Results (from the past 240 hours)  Resp panel by RT-PCR (RSV, Flu A&B, Covid) Anterior Nasal Swab     Status: None   Collection Time: 01/16/24 10:10 AM   Specimen: Anterior Nasal Swab  Result Value Ref Range Status   SARS Coronavirus 2 by RT PCR NEGATIVE NEGATIVE Final    Comment: (NOTE) SARS-CoV-2 target nucleic acids are NOT DETECTED.  The SARS-CoV-2 RNA is generally detectable in upper respiratory specimens during the acute phase of infection. The lowest concentration of SARS-CoV-2 viral copies this assay can detect is 138 copies/mL. A negative result does not preclude SARS-Cov-2 infection and should not be used as the sole basis for treatment or other patient management decisions. A negative result may occur with  improper specimen collection/handling, submission of specimen other than nasopharyngeal swab, presence of viral mutation(s) within the areas targeted by this assay, and inadequate number of viral copies(<138 copies/mL). A negative result must be combined with clinical observations, patient history, and epidemiological information. The expected result is Negative.  Fact Sheet for Patients:  BloggerCourse.com  Fact Sheet for Healthcare Providers:  SeriousBroker.it  This test is no t yet approved or cleared by the [place]  FDA and  has been authorized for detection and/or diagnosis of SARS-CoV-2 by FDA under an Emergency Use Authorization (EUA). This EUA will remain  in effect (meaning this test can be used) for the duration  of the COVID-19 declaration under Section 564(b)(1) of the Act, 21 U.S.C.section 360bbb-3(b)(1), unless the authorization is terminated  or revoked sooner.       Influenza A by PCR NEGATIVE NEGATIVE Final   Influenza B by PCR NEGATIVE NEGATIVE Final    Comment: (NOTE) The Xpert Xpress SARS-CoV-2/FLU/RSV plus assay is intended as an aid in the diagnosis of influenza from Nasopharyngeal swab specimens and should not be used as a sole basis for treatment. Nasal washings and aspirates are unacceptable for Xpert Xpress SARS-CoV-2/FLU/RSV testing.  Fact Sheet for Patients: BloggerCourse.com  Fact Sheet for Healthcare Providers: SeriousBroker.it  This test is not yet approved or cleared by the [place]  FDA and has been authorized for detection and/or diagnosis of SARS-CoV-2 by FDA under an Emergency Use Authorization (EUA). This EUA will remain in effect (meaning this test can be used) for the duration of the COVID-19 declaration under Section 564(b)(1) of the Act, 21 U.S.C. section 360bbb-3(b)(1), unless the authorization is terminated or revoked.     Resp  Syncytial Virus by PCR NEGATIVE NEGATIVE Final    Comment: (NOTE) Fact Sheet for Patients: BloggerCourse.com  Fact Sheet for Healthcare Providers: SeriousBroker.it  This test is not yet approved or cleared by the [place]  FDA and has been authorized for detection and/or diagnosis of SARS-CoV-2 by FDA under an Emergency Use Authorization (EUA). This EUA will remain in effect (meaning this test can be used) for the duration of the COVID-19 declaration under Section 564(b)(1) of the Act, 21 U.S.C. section 360bbb-3(b)(1), unless the authorization is terminated or revoked.  Performed at [facility], [address], [place], [place] [address]          Radiology Studies: No results  found.      Scheduled Meds:  apixaban   5 mg Oral BID   atorvastatin   40 mg Oral Daily   cloNIDine   0.1 mg Oral TID   feeding supplement  237 mL Oral BID BM   fluticasone  furoate-vilanterol  1 puff Inhalation Daily   folic acid   1 mg Oral Daily   lisinopril   20 mg Oral Daily   magnesium  oxide  800 mg Oral Daily   metoprolol  succinate  100 mg Oral Daily   multivitamin with minerals  1 tablet Oral Daily   polyethylene glycol  17 g Oral Daily   pregabalin   50 mg Oral BID   senna  1 tablet Oral Daily   sodium chloride   1 g Oral BID WC   thiamine   100 mg Oral Daily   Or   thiamine   100 mg Intravenous Daily   Continuous Infusions:   LOS: 0 days        Anthony CHRISTELLA Pouch, MD [facility] Pager [phone]  If 7PM-7AM, please contact night-coverage [email or web address] 01/22/2024, 7:59 AM

## 2024-01-22 NOTE — Progress Notes (Signed)
 Went into patient room and gave him his ensure. Patient was complaining of a headache still, I let the patient know that there wasn't anymore pain medication I could give him at this time being as his dilaudid  was given at 1224 and is every 4 hours and Zanaflex  was given at 1054 and is every 8 hours I asked if he needed anything else or if anything else would help he said no. I gave patient his phone and call bell was in reach.

## 2024-01-22 NOTE — Progress Notes (Signed)
 Mobility Specialist - Progress Note   01/22/24 1300  Mobility  Activity Ambulated with assistance  Level of Assistance Contact guard assist, steadying assist  Assistive Device Front wheel walker  Distance Ambulated (ft) 120 ft  Range of Motion/Exercises Active Assistive  Activity Response Tolerated well;Tolerated fair  Mobility visit 1 Mobility  Mobility Specialist Start Time (ACUTE ONLY) 1146  Mobility Specialist Stop Time (ACUTE ONLY) 1209  Mobility Specialist Time Calculation (min) (ACUTE ONLY) 23 min   Pt was seated in the recliner upon entry. Pt agreed to mobility. Pt did state that he has left arm complications as the day progresses. Pt was able to STS with CGA and 2 WW to the upright position. Pt ambulated well with 2 WW and CGA to maintain balance. Pt needs cues for directional awareness as well as to keep legs wide for better gait position. After activity pt returned to recliner. All needs in reach.  Scott [name] Mobility Specialist 01/22/24, 1:13 PM

## 2024-01-23 ENCOUNTER — Telehealth: Payer: Self-pay | Admitting: Neurosurgery

## 2024-01-23 DIAGNOSIS — R531 Weakness: Secondary | ICD-10-CM | POA: Diagnosis not present

## 2024-01-23 DIAGNOSIS — Z743 Need for continuous supervision: Secondary | ICD-10-CM | POA: Diagnosis not present

## 2024-01-23 LAB — BASIC METABOLIC PANEL WITH GFR
Anion gap: 8 (ref 5–15)
BUN: 23 mg/dL (ref 8–23)
CO2: 24 mmol/L (ref 22–32)
Calcium: 8.6 mg/dL — ABNORMAL LOW (ref 8.9–10.3)
Chloride: 101 mmol/L (ref 98–111)
Creatinine, Ser: 0.65 mg/dL (ref 0.61–1.24)
GFR, Estimated: >60 mL/min (ref >60)
Glucose, Bld: 92 mg/dL (ref 70–99)
Potassium: 4.1 mmol/L (ref 3.5–5.1)
Sodium: 133 mmol/L — ABNORMAL LOW (ref 135–145)

## 2024-01-23 MED ORDER — LISINOPRIL 20 MG PO TABS: 20.0000 mg | ORAL_TABLET | Freq: Every day | ORAL | 0 refills | Status: DC

## 2024-01-23 MED ORDER — HYDROMORPHONE HCL 2 MG PO TABS: 2.0000 mg | ORAL_TABLET | ORAL | 0 refills | Status: AC | PRN

## 2024-01-23 MED ORDER — POLYETHYLENE GLYCOL 3350 17 G PO PACK: 17.0000 g | PACK | Freq: Every day | ORAL | Status: DC | PRN

## 2024-01-23 NOTE — Progress Notes (Signed)
 PROGRESS NOTE    Scott [name]  FMW:[id number] DOB: 08-09-1960 DOA: 01/16/2024 PCP: Everlene Parris LABOR, MD    Assessment & Plan:   Principal Problem:   Generalized weakness Active Problems:   Fall at home, initial encounter   Hematoma of upper extremity (Left)   CAD (coronary artery disease)   COPD (chronic obstructive pulmonary disease) (HCC)   Hypercholesterolemia   Chronic foot pain (1ry area of Pain) (Left)   Alcohol  abuse   Pulmonary emboli (HCC)  Assessment and Plan: Cervical myelopathy: s/p recent ACDF. MRI doesn't appear to show any acute changes. PT/OT recs SNF. Still waiting on insurance auth today. Will need to f/u outpatient w/ neuro surg at previously scheduled appointment.   Generalized weakness: PT/OT recs SNF   Recent PE: continue on home dose of eliquis . Hx of DVT  Alcohol  abuse: drinks 4 alcoholic beverages a day. Continue on CIWA protocol    HTN: continue on lisinopril ,clonidine . Holding home dose of HCTZ   Hyponatremia: chronic. Trending up.    COPD: w/o exacerbation. Continue on bronchodilators    PAD: continue on home dose of eliquis . No longer on aspirin        DVT prophylaxis: eliquis  Code Status: full  Family Communication:  Disposition Plan: medically stable. Waiting on insurance auth still  Level of care: Med-Surg  Status is: Observation The patient remains OBS appropriate and will d/c before 2 midnights.    Consultants:  Neuro surg   Procedures:  Antimicrobials:   Subjective: Pt c/o fatigue  Objective: Vitals:   01/22/24 1640 01/22/24 1943 01/23/24 0505 01/23/24 0755  BP: 114/76 118/72 139/88 (!) 148/88  Pulse: 85 71 79 76  Resp: 18 18 18 18   Temp: 98.2 F (36.8 C) 98.4 F (36.9 C) 98.1 F (36.7 C) 98.2 F (36.8 C)  TempSrc:      SpO2: 96% 95% 90% 92%  Weight:      Height:        Intake/Output Summary (Last 24 hours) at 01/23/2024 0839 Last data filed at 01/22/2024 2118 Gross per 24 hour  Intake 960 ml   Output 1200 ml  Net -240 ml   Filed Weights   01/16/24 0722  Weight: 70.8 kg    Examination:  General exam: appears calm & comfortable Respiratory system: decreased breath sounds b/l Cardiovascular system: S1 & S2+. No rubs or clicks  Gastrointestinal system: Abd is soft, NT, ND & hypoactive bowel sounds Central nervous system: alert & awake Psychiatry: judgement and insight appears close to baseline. Flat mood and affect   Data Reviewed: I have personally reviewed following labs and imaging studies  CBC: No results for input(s): WBC, NEUTROABS, HGB, HCT, MCV, PLT in the last 168 hours.  Basic Metabolic Panel: Recent Labs  Lab 01/18/24 0402 01/19/24 0542 01/20/24 0354 01/21/24 0850 01/23/24 0418  NA 128* 129* 128* 131* 133*  K 3.2* 3.5 3.5 4.0 4.1  CL 91* 93* 93* 95* 101  CO2 27 27 26 25 24   GLUCOSE 94 110* 108* 103* 92  BUN 13 15 22 18 23   CREATININE 0.45* 0.45* 0.64 0.59* 0.65  CALCIUM  8.9 8.8* 8.4* 8.5* 8.6*  MG 1.8  --   --   --   --    GFR: Estimated Creatinine Clearance: 94.6 mL/min (by C-G formula based on SCr of 0.65 mg/dL). Liver Function Tests: No results for input(s): AST, ALT, ALKPHOS, BILITOT, PROT, ALBUMIN  in the last 168 hours.  No results for input(s): LIPASE, AMYLASE in the last  168 hours. No results for input(s): AMMONIA in the last 168 hours. Coagulation Profile: No results for input(s): INR, PROTIME in the last 168 hours. Cardiac Enzymes: No results for input(s): CKTOTAL, CKMB, CKMBINDEX, TROPONINI in the last 168 hours. BNP (last 3 results) No results for input(s): PROBNP in the last 8760 hours. HbA1C: No results for input(s): HGBA1C in the last 72 hours. CBG: No results for input(s): GLUCAP in the last 168 hours. Lipid Profile: No results for input(s): CHOL, HDL, LDLCALC, TRIG, CHOLHDL, LDLDIRECT in the last 72 hours. Thyroid  Function Tests: No results for input(s): TSH,  T4TOTAL, FREET4, T3FREE, THYROIDAB in the last 72 hours.  Anemia Panel: No results for input(s): VITAMINB12, FOLATE, FERRITIN, TIBC, IRON, RETICCTPCT in the last 72 hours. Sepsis Labs: No results for input(s): PROCALCITON, LATICACIDVEN in the last 168 hours.  Recent Results (from the past 240 hours)  Resp panel by RT-PCR (RSV, Flu A&B, Covid) Anterior Nasal Swab     Status: None   Collection Time: 01/16/24 10:10 AM   Specimen: Anterior Nasal Swab  Result Value Ref Range Status   SARS Coronavirus 2 by RT PCR NEGATIVE NEGATIVE Final    Comment: (NOTE) SARS-CoV-2 target nucleic acids are NOT DETECTED.  The SARS-CoV-2 RNA is generally detectable in upper respiratory specimens during the acute phase of infection. The lowest concentration of SARS-CoV-2 viral copies this assay can detect is 138 copies/mL. A negative result does not preclude SARS-Cov-2 infection and should not be used as the sole basis for treatment or other patient management decisions. A negative result may occur with  improper specimen collection/handling, submission of specimen other than nasopharyngeal swab, presence of viral mutation(s) within the areas targeted by this assay, and inadequate number of viral copies(<138 copies/mL). A negative result must be combined with clinical observations, patient history, and epidemiological information. The expected result is Negative.  Fact Sheet for Patients:  BloggerCourse.com  Fact Sheet for Healthcare Providers:  SeriousBroker.it  This test is no t yet approved or cleared by the [place]  FDA and  has been authorized for detection and/or diagnosis of SARS-CoV-2 by FDA under an Emergency Use Authorization (EUA). This EUA will remain  in effect (meaning this test can be used) for the duration of the COVID-19 declaration under Section 564(b)(1) of the Act, 21 U.S.C.section 360bbb-3(b)(1), unless  the authorization is terminated  or revoked sooner.       Influenza A by PCR NEGATIVE NEGATIVE Final   Influenza B by PCR NEGATIVE NEGATIVE Final    Comment: (NOTE) The Xpert Xpress SARS-CoV-2/FLU/RSV plus assay is intended as an aid in the diagnosis of influenza from Nasopharyngeal swab specimens and should not be used as a sole basis for treatment. Nasal washings and aspirates are unacceptable for Xpert Xpress SARS-CoV-2/FLU/RSV testing.  Fact Sheet for Patients: BloggerCourse.com  Fact Sheet for Healthcare Providers: SeriousBroker.it  This test is not yet approved or cleared by the [place]  FDA and has been authorized for detection and/or diagnosis of SARS-CoV-2 by FDA under an Emergency Use Authorization (EUA). This EUA will remain in effect (meaning this test can be used) for the duration of the COVID-19 declaration under Section 564(b)(1) of the Act, 21 U.S.C. section 360bbb-3(b)(1), unless the authorization is terminated or revoked.     Resp Syncytial Virus by PCR NEGATIVE NEGATIVE Final    Comment: (NOTE) Fact Sheet for Patients: BloggerCourse.com  Fact Sheet for Healthcare Providers: SeriousBroker.it  This test is not yet approved or cleared by the [place]  FDA  and has been authorized for detection and/or diagnosis of SARS-CoV-2 by FDA under an Emergency Use Authorization (EUA). This EUA will remain in effect (meaning this test can be used) for the duration of the COVID-19 declaration under Section 564(b)(1) of the Act, 21 U.S.C. section 360bbb-3(b)(1), unless the authorization is terminated or revoked.  Performed at [facility], [address], [place], [place] [address]          Radiology Studies: No results found.      Scheduled Meds:  apixaban   5 mg Oral BID   atorvastatin   40 mg Oral Daily   cloNIDine   0.1 mg Oral TID    feeding supplement  237 mL Oral BID BM   fluticasone  furoate-vilanterol  1 puff Inhalation Daily   folic acid   1 mg Oral Daily   lisinopril   20 mg Oral Daily   magnesium  oxide  800 mg Oral Daily   metoprolol  succinate  100 mg Oral Daily   multivitamin with minerals  1 tablet Oral Daily   polyethylene glycol  17 g Oral Daily   pregabalin   50 mg Oral BID   senna  1 tablet Oral Daily   sodium chloride   1 g Oral BID WC   thiamine   100 mg Oral Daily   Or   thiamine   100 mg Intravenous Daily   Continuous Infusions:   LOS: 0 days        Anthony CHRISTELLA Pouch, MD [facility] Pager [phone]  If 7PM-7AM, please contact night-coverage [email or web address] 01/23/2024, 8:39 AM

## 2024-01-23 NOTE — Discharge Summary (Signed)
 Physician Discharge Summary  Scott [name] FMW:[id number] DOB: 03-Nov-1960 DOA: 01/16/2024  PCP: Everlene Parris LABOR, MD  Admit date: 01/16/2024 Discharge date: 01/23/2024  Admitted From: home  Disposition:  SNF  Recommendations for Outpatient Follow-up:  Follow up with PCP in 1-2 weeks F/u w/ neuro surg, Dr. Claudene, at previously scheduled appt  Home Health: no  Equipment/Devices:  Discharge Condition: stable  CODE STATUS: full  Diet recommendation: Heart Healthy  Brief/Interim Summary: HPI was taken from Dr. Roann: Scott [name] is a pleasant 63 y.o. male with medical history significant for chronic foot pain, arthritis, COPD, DVT/PE on Eliquis , CAD, neck pain s/p recent ACDF on 01/08/2024 for cervical spinal stenosis at C3-C4 level, recent admission after 4 days of that surgery after a fall, presented to ED at [facility] today with worsening weakness to his bilateral upper and lower extremities.  This has been ongoing for the past week but was worsened over time.  Patient was here on 9/22 for similar symptoms and he was eval by neurosurgery and no further intervention was needed at that time. Since patient was discharged home he continued to have worsening symptoms, he has not been able to pick up any of his medications including his Lyrica  or Eliquis .  Lives at home alone and has been having more difficulty using his walker to ambulate.  He stated that he slipped out of his chair onto the floor and has lower back pain now.  He stated that he did not hit his head, does not have incontinence or saddle anesthesia. EMS note: Patient was wheezing gave him 1 DuoNeb and route.  Patient was complaining of pain to his bilateral upper and lower extremity as well as back and neck and left foot.  He does have chronic pain and foot pain.  Denies any chest pain, shortness of breath, fever, palpitations, cough or urinary symptoms.   ED Course: Upon arrival to the ED, patient is found to be weak on both upper  and lower extremities, chest x-ray without any acute pathology, x-ray of the left elbow no fractures, x-ray of the left shoulder no fracture, troponins negative, BNP not elevated, no leukocytosis patient was hypoxemic at times required DuoNebs.  Hospitalist service was consulted for evaluation for admission for possible worsening weakness, back pain and left-sided sensory deficit.    Discharge Diagnoses:  Principal Problem:   Generalized weakness Active Problems:   Fall at home, initial encounter   Hematoma of upper extremity (Left)   CAD (coronary artery disease)   COPD (chronic obstructive pulmonary disease) (HCC)   Hypercholesterolemia   Chronic foot pain (1ry area of Pain) (Left)   Alcohol  abuse   Pulmonary emboli (HCC)  Cervical myelopathy: s/p recent ACDF. MRI doesn't appear to show any acute changes. PT/OT recs SNF.  Will need to f/u outpatient w/ neuro surg at previously scheduled appointment.    Generalized weakness: PT/OT recs SNF   Recent PE: continue on home dose of eliquis . Hx of DVT   Alcohol  abuse: drinks 4 alcoholic beverages a day. Received alcohol  cessation counseling already    HTN: continue on lisinopril ,clonidine . D/c home dose of HCTZ    Hyponatremia: chronic. Trending up.    COPD: w/o exacerbation. Continue on bronchodilators    PAD: continue on home dose of eliquis . No longer on aspirin    Discharge Instructions  Discharge Instructions     Diet general   Complete by: As directed    Discharge instructions   Complete by: As directed  F/u w/ PCP in 1-2 weeks. F/u w/ neuro surg, Dr. Claudene, at previously scheduled appt   Increase activity slowly   Complete by: As directed    No wound care   Complete by: As directed       Allergies as of 01/23/2024       Reactions   Acetaminophen -codeine Other (See Comments)   Nausea and hot flash   Chantix [varenicline] Nausea Only   Sour taste in mouth   Codeine Nausea Only   Gabapentin Swelling    Pregabalin  Swelling        Medication List     STOP taking these medications    aspirin  EC 81 MG tablet   cephALEXin  500 MG capsule Commonly known as: KEFLEX    lisinopril -hydrochlorothiazide  20-12.5 MG tablet Commonly known as: ZESTORETIC        TAKE these medications    acetaminophen  500 MG tablet Commonly known as: TYLENOL  Take 2 tablets (1,000 mg total) by mouth every 6 (six) hours as needed.   atorvastatin  40 MG tablet Commonly known as: LIPITOR Take 40 mg by mouth daily. What changed:  how much to take when to take this   budesonide-formoterol  160-4.5 MCG/ACT inhaler Commonly known as: SYMBICORT Inhale 2 puffs into the lungs 2 (two) times daily.   cholecalciferol 25 MCG (1000 UNIT) tablet Commonly known as: VITAMIN D3 Take 1,000 Units by mouth daily.   cloNIDine  0.1 MG tablet Commonly known as: CATAPRES  Take 0.1-0.2 mg by mouth See admin instructions. 0.2 mg every morning. If bp elevated later in the day, pt will take 0.1 mg tablet if needed   cyanocobalamin  500 MCG tablet Commonly known as: VITAMIN B12 Take 500 mcg by mouth daily.   docusate sodium  100 MG capsule Commonly known as: Colace Take 1 capsule (100 mg total) by mouth 2 (two) times daily.   Eliquis  DVT/PE Starter Pack Generic drug: Apixaban  Starter Pack (10mg  and 5mg ) Take as directed on package: start with two-5mg  tablets twice daily for 7 days. On day 8, switch to one-5mg  tablet twice daily.   HYDROmorphone  2 MG tablet Commonly known as: Dilaudid  Take 1 tablet (2 mg total) by mouth every 4 (four) hours as needed for up to 1 day for severe pain (pain score 7-10) or moderate pain (pain score 4-6). What changed: reasons to take this   ipratropium-albuterol  0.5-2.5 (3) MG/3ML Soln Commonly known as: DUONEB Inhale 3 mLs into the lungs every 6 (six) hours as needed.   lisinopril  20 MG tablet Commonly known as: ZESTRIL  Take 1 tablet (20 mg total) by mouth daily. Start taking on: January 24, 2024   metoprolol  succinate 100 MG 24 hr tablet Commonly known as: TOPROL -XL Take 100 mg by mouth every morning. Take with or immediately following a meal.   nicotine  polacrilex 4 MG lozenge Commonly known as: COMMIT Take 4 mg by mouth as needed.   ondansetron  4 MG tablet Commonly known as: Zofran  Take 1 tablet (4 mg total) by mouth every 8 (eight) hours as needed for nausea or vomiting.   Potassium 99 MG Tabs Take 1 tablet by mouth daily at 6 (six) AM.   pregabalin  50 MG capsule Commonly known as: Lyrica  Take 1 capsule (50 mg total) by mouth 2 (two) times daily.   senna 8.6 MG Tabs tablet Commonly known as: SENOKOT Take 1 tablet (8.6 mg total) by mouth daily.   tiZANidine  4 MG tablet Commonly known as: Zanaflex  Take 0.5-1 tablets (2-4 mg total) by mouth every 8 (  eight) hours as needed.        Contact information for follow-up providers     Everlene Parris LABOR, MD Follow up.   Specialty: Family Medicine Why: hospital follow up Contact information: [address] [place] [address] [phone]              Contact information for after-discharge care     Destination     [facility] .   Service: Skilled Nursing Contact information: [address] [place]  [address] [phone]                    Allergies  Allergen Reactions   Acetaminophen -Codeine Other (See Comments)    Nausea and hot flash    Chantix [Varenicline] Nausea Only    Sour taste in mouth   Codeine Nausea Only   Gabapentin Swelling   Pregabalin  Swelling    Consultations: Neuro surg    Procedures/Studies: MR BRAIN WO CONTRAST Result Date: 01/16/2024 CLINICAL DATA:  Provided history: Neuro deficit, acute, stroke suspected. Additional history provided: Worsening upper and lower extremity weakness. EXAM: MRI HEAD WITHOUT CONTRAST TECHNIQUE: Multiplanar, multiecho pulse sequences of the brain and surrounding structures were obtained without  intravenous contrast. COMPARISON:  Brain MRI 06/11/2022. FINDINGS: Brain: No age-advanced or lobar predominant cerebral atrophy. Redemonstrated focus of chronic encephalomalacia/gliosis within the inferolateral left temporal lobe. Mild multifocal T2 FLAIR hyperintense signal abnormality elsewhere within the cerebral white matter, nonspecific but compatible with chronic small vessel ischemic disease. Several nonspecific chronic microhemorrhages scattered within the supratentorial brain, progressed in number. There is no acute infarct. No evidence of an intracranial mass. No extra-axial fluid collection. No midline shift. Vascular: Maintained flow voids within the proximal large arterial vessels. Skull and upper cervical spine: No focal worrisome calvarial marrow lesion. Cervical spine findings separately reported on same day cervical spine MRI. Sinuses/Orbits: No mass or acute finding within the imaged orbits. Mucous retention cysts within the right maxillary sinus measuring up to 14 mm. Trace mucosal thickening within the left maxillary sinus. Mild mucosal thickening within the right sphenoid sinus. Other: Redemonstrated dermal cysts within the left face and right occipital region. IMPRESSION: 1.  No evidence of an acute intracranial abnormality. 2. Unchanged focus of chronic encephalomalacia/gliosis within the inferolateral left temporal lobe, nonspecific but likely post-traumatic in this location. 3. Mild chronic small vessel ischemic changes within the cerebral white matter, similar to the prior MRI of 06/11/2022. 4. Several nonspecific chronic microhemorrhages within the supratentorial brain, increased in number. 5. Paranasal sinus disease as described. Electronically Signed   By: Rockey Childs D.O.   On: 01/16/2024 13:58   MR Cervical Spine Wo Contrast Result Date: 01/16/2024 CLINICAL DATA:  Neck pain with worsening weakness in all 4 extremities. History of cervical decompression and fusion 01/08/2024. EXAM:  MRI CERVICAL, THORACIC AND LUMBAR SPINE WITHOUT CONTRAST TECHNIQUE: Multiplanar and multiecho pulse sequences of the cervical spine, to include the craniocervical junction and cervicothoracic junction, and thoracic and lumbar spine, were obtained without intravenous contrast. COMPARISON:  Cervical MRI 01/12/2024 and 11/15/2023. Lumbar MRI 11/15/2023. Chest CTA 01/12/2024. Abdominopelvic CT 11/20/2023. FINDINGS: Technical note: Despite efforts by the technologist and patient, mild motion artifact is present on today's exam and could not be eliminated. This reduces exam sensitivity and specificity. MRI CERVICAL SPINE FINDINGS Alignment: The alignment is stable with straightening. No focal angulation or significant listhesis. Vertebrae: Postsurgical changes from recent C3-4 ACDF. No hardware displacement or acute fracture identified. Heterogeneous marrow edema appears  similar to prior studies, likely discogenic. Assessment limited by motion. No specific evidence of discitis or osteomyelitis. Cord: Diffuse effacement of the CSF surrounding the cord, similar to prior studies. Possible mild T2 hyperintensity in the cord at C3, suboptimally evaluated due to motion. No epidural fluid collections are identified. Posterior Fossa, vertebral arteries, paraspinal tissues: Visualized portions of the posterior fossa appear unremarkable.Intracranial findings are dictated separately. Preserved vertebral artery flow voids. Similar prevertebral soft tissue swelling, likely related to recent surgery. Disc levels: Cervical disc space assessment limited by motion artifact. No gross change from recent prior postoperative study. There is multilevel spondylosis contributing to mild spinal stenosis and variable degrees of foraminal narrowing at multiple levels. As above, possible T2 hyperintensity in the cord at C3, suboptimally evaluated due to motion. MRI THORACIC SPINE FINDINGS Alignment: Mild convex right scoliosis. No focal angulation  or listhesis. Vertebrae: No acute or suspicious osseous findings. Cord:  The thoracic cord appears normal in signal and caliber. Paraspinal and other soft tissues: No significant paraspinal abnormalities. Disc levels: Very mild thoracic spondylosis with mild disc bulging and endplate osteophyte formation at T9-10, T10-11 and T11-12. No focal disc herniation, significant spinal stenosis or foraminal narrowing demonstrated. MRI LUMBAR SPINE FINDINGS Segmentation: Conventional anatomy assumed, with the last open disc space designated L5-S1.Concordant with prior imaging. Alignment: Stable mild convex left scoliosis and trace retrolisthesis at L2-3. Vertebrae: No worrisome osseous lesion, acute fracture or pars defect. Asymmetric facet arthropathy on the right at L3-4. Conus medullaris: Extends to the L1 level and appears normal. Paraspinal and other soft tissues: No acute paraspinal findings. Mild soft tissue edema around the right L3-4 facet joint. 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 This is suboptimally evaluated by this examination, although appears grossly similar to previous lumbar MRI and abdominal CTA. Disc levels: L1-2: Preserved disc height. Mild facet hypertrophy. No spinal stenosis or foraminal narrowing. L2-3: Mild loss of disc height with annular disc bulging and endplate osteophytes. Mild facet and ligamentous hypertrophy. Unchanged mild spinal stenosis and mild lateral recess narrowing on the right. No significant foraminal narrowing or nerve root impingement. L3-4: Mild loss of disc height with annular disc bulging, endplate osteophytes and moderate facet/ligamentous hypertrophy. Resulting moderate multifactorial spinal stenosis with asymmetric right lateral recess and right foraminal narrowing. Findings are similar to previous MRI. L4-5: Chronic loss of disc height with annular disc  bulging, facet and ligamentous hypertrophy. Similar mild spinal stenosis with asymmetric left lateral recess narrowing. Mild foraminal narrowing bilaterally without L4 nerve root impingement. L5-S1: Mild chronic loss of disc height with annular disc bulging and endplate osteophytes asymmetric to the left. Bilateral facet hypertrophy. Stable mild left foraminal narrowing. The spinal canal and right foramen are patent. IMPRESSION: 1. Motion degraded examination, especially of the cervical spine. 2. Postsurgical changes from recent C3-4 ACDF. No hardware displacement or acute fracture identified. 3. Diffuse effacement of the CSF surrounding the cord in the cervical spine, similar to prior studies. Possible mild T2 hyperintensity in the cord at C3, suboptimally evaluated due to motion. 4. Given the motion limitations of this examination, consider further evaluation with CT of the cervical spine. 5. No acute findings or significant changes in the thoracic or lumbar spine. 6. 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 7. 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 This is suboptimally evaluated by this  examination, although appears grossly similar to previous lumbar MRI and abdominal CTA. See follow up recommendations from abdominal CTA 11/20/2023. Electronically Signed   By: Elsie Perone M.D.   On: 01/16/2024 13:31   MR THORACIC SPINE WO CONTRAST Result Date: 01/16/2024 CLINICAL DATA:  Neck pain with worsening weakness in all 4 extremities. History of cervical decompression and fusion 01/08/2024. EXAM: MRI CERVICAL, THORACIC AND LUMBAR SPINE WITHOUT CONTRAST TECHNIQUE: Multiplanar and multiecho pulse sequences of the cervical spine, to include the craniocervical junction and cervicothoracic junction, and thoracic and lumbar spine, were obtained without intravenous contrast. COMPARISON:  Cervical MRI 01/12/2024 and  11/15/2023. Lumbar MRI 11/15/2023. Chest CTA 01/12/2024. Abdominopelvic CT 11/20/2023. FINDINGS: Technical note: Despite efforts by the technologist and patient, mild motion artifact is present on today's exam and could not be eliminated. This reduces exam sensitivity and specificity. MRI CERVICAL SPINE FINDINGS Alignment: The alignment is stable with straightening. No focal angulation or significant listhesis. Vertebrae: Postsurgical changes from recent C3-4 ACDF. No hardware displacement or acute fracture identified. Heterogeneous marrow edema appears similar to prior studies, likely discogenic. Assessment limited by motion. No specific evidence of discitis or osteomyelitis. Cord: Diffuse effacement of the CSF surrounding the cord, similar to prior studies. Possible mild T2 hyperintensity in the cord at C3, suboptimally evaluated due to motion. No epidural fluid collections are identified. Posterior Fossa, vertebral arteries, paraspinal tissues: Visualized portions of the posterior fossa appear unremarkable.Intracranial findings are dictated separately. Preserved vertebral artery flow voids. Similar prevertebral soft tissue swelling, likely related to recent surgery. Disc levels: Cervical disc space assessment limited by motion artifact. No gross change from recent prior postoperative study. There is multilevel spondylosis contributing to mild spinal stenosis and variable degrees of foraminal narrowing at multiple levels. As above, possible T2 hyperintensity in the cord at C3, suboptimally evaluated due to motion. MRI THORACIC SPINE FINDINGS Alignment: Mild convex right scoliosis. No focal angulation or listhesis. Vertebrae: No acute or suspicious osseous findings. Cord:  The thoracic cord appears normal in signal and caliber. Paraspinal and other soft tissues: No significant paraspinal abnormalities. Disc levels: Very mild thoracic spondylosis with mild disc bulging and endplate osteophyte formation at T9-10,  T10-11 and T11-12. No focal disc herniation, significant spinal stenosis or foraminal narrowing demonstrated. MRI LUMBAR SPINE FINDINGS Segmentation: Conventional anatomy assumed, with the last open disc space designated L5-S1.Concordant with prior imaging. Alignment: Stable mild convex left scoliosis and trace retrolisthesis at L2-3. Vertebrae: No worrisome osseous lesion, acute fracture or pars defect. Asymmetric facet arthropathy on the right at L3-4. Conus medullaris: Extends to the L1 level and appears normal. Paraspinal and other soft tissues: No acute paraspinal findings. Mild soft tissue edema around the right L3-4 facet joint. 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 This is suboptimally evaluated by this examination, although appears grossly similar to previous lumbar MRI and abdominal CTA. Disc levels: L1-2: Preserved disc height. Mild facet hypertrophy. No spinal stenosis or foraminal narrowing. L2-3: Mild loss of disc height with annular disc bulging and endplate osteophytes. Mild facet and ligamentous hypertrophy. Unchanged mild spinal stenosis and mild lateral recess narrowing on the right. No significant foraminal narrowing or nerve root impingement. L3-4: Mild loss of disc height with annular disc bulging, endplate osteophytes and moderate facet/ligamentous hypertrophy. Resulting moderate multifactorial spinal stenosis with asymmetric right lateral recess and right foraminal narrowing. Findings are similar to previous MRI. L4-5: Chronic loss of disc  height with annular disc bulging, facet and ligamentous hypertrophy. Similar mild spinal stenosis with asymmetric left lateral recess narrowing. Mild foraminal narrowing bilaterally without L4 nerve root impingement. L5-S1: Mild chronic loss of disc height with annular disc bulging and endplate osteophytes asymmetric to the left. Bilateral facet  hypertrophy. Stable mild left foraminal narrowing. The spinal canal and right foramen are patent. IMPRESSION: 1. Motion degraded examination, especially of the cervical spine. 2. Postsurgical changes from recent C3-4 ACDF. No hardware displacement or acute fracture identified. 3. Diffuse effacement of the CSF surrounding the cord in the cervical spine, similar to prior studies. Possible mild T2 hyperintensity in the cord at C3, suboptimally evaluated due to motion. 4. Given the motion limitations of this examination, consider further evaluation with CT of the cervical spine. 5. No acute findings or significant changes in the thoracic or lumbar spine. 6. 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 7. 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 This is suboptimally evaluated by this examination, although appears grossly similar to previous lumbar MRI and abdominal CTA. See follow up recommendations from abdominal CTA 11/20/2023. Electronically Signed   By: Elsie Perone M.D.   On: 01/16/2024 13:31   MR LUMBAR SPINE WO CONTRAST Result Date: 01/16/2024 CLINICAL DATA:  Neck pain with worsening weakness in all 4 extremities. History of cervical decompression and fusion 01/08/2024. EXAM: MRI CERVICAL, THORACIC AND LUMBAR SPINE WITHOUT CONTRAST TECHNIQUE: Multiplanar and multiecho pulse sequences of the cervical spine, to include the craniocervical junction and cervicothoracic junction, and thoracic and lumbar spine, were obtained without intravenous contrast. COMPARISON:  Cervical MRI 01/12/2024 and 11/15/2023. Lumbar MRI 11/15/2023. Chest CTA 01/12/2024. Abdominopelvic CT 11/20/2023. FINDINGS: Technical note: Despite efforts by the technologist and patient, mild motion artifact is present on today's exam and could not be eliminated. This reduces exam sensitivity and specificity. MRI CERVICAL SPINE FINDINGS Alignment: The  alignment is stable with straightening. No focal angulation or significant listhesis. Vertebrae: Postsurgical changes from recent C3-4 ACDF. No hardware displacement or acute fracture identified. Heterogeneous marrow edema appears similar to prior studies, likely discogenic. Assessment limited by motion. No specific evidence of discitis or osteomyelitis. Cord: Diffuse effacement of the CSF surrounding the cord, similar to prior studies. Possible mild T2 hyperintensity in the cord at C3, suboptimally evaluated due to motion. No epidural fluid collections are identified. Posterior Fossa, vertebral arteries, paraspinal tissues: Visualized portions of the posterior fossa appear unremarkable.Intracranial findings are dictated separately. Preserved vertebral artery flow voids. Similar prevertebral soft tissue swelling, likely related to recent surgery. Disc levels: Cervical disc space assessment limited by motion artifact. No gross change from recent prior postoperative study. There is multilevel spondylosis contributing to mild spinal stenosis and variable degrees of foraminal narrowing at multiple levels. As above, possible T2 hyperintensity in the cord at C3, suboptimally evaluated due to motion. MRI THORACIC SPINE FINDINGS Alignment: Mild convex right scoliosis. No focal angulation or listhesis. Vertebrae: No acute or suspicious osseous findings. Cord:  The thoracic cord appears normal in signal and caliber. Paraspinal and other soft tissues: No significant paraspinal abnormalities. Disc levels: Very mild thoracic spondylosis with mild disc bulging and endplate osteophyte formation at T9-10, T10-11 and T11-12. No focal disc herniation, significant spinal stenosis or foraminal narrowing demonstrated. MRI LUMBAR SPINE FINDINGS Segmentation: Conventional anatomy assumed, with the last open disc space designated L5-S1.Concordant with prior imaging. Alignment: Stable mild convex left scoliosis and trace retrolisthesis at  L2-3. Vertebrae: No  worrisome osseous lesion, acute fracture or pars defect. Asymmetric facet arthropathy on the right at L3-4. Conus medullaris: Extends to the L1 level and appears normal. Paraspinal and other soft tissues: No acute paraspinal findings. Mild soft tissue edema around the right L3-4 facet joint. 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 This is suboptimally evaluated by this examination, although appears grossly similar to previous lumbar MRI and abdominal CTA. Disc levels: L1-2: Preserved disc height. Mild facet hypertrophy. No spinal stenosis or foraminal narrowing. L2-3: Mild loss of disc height with annular disc bulging and endplate osteophytes. Mild facet and ligamentous hypertrophy. Unchanged mild spinal stenosis and mild lateral recess narrowing on the right. No significant foraminal narrowing or nerve root impingement. L3-4: Mild loss of disc height with annular disc bulging, endplate osteophytes and moderate facet/ligamentous hypertrophy. Resulting moderate multifactorial spinal stenosis with asymmetric right lateral recess and right foraminal narrowing. Findings are similar to previous MRI. L4-5: Chronic loss of disc height with annular disc bulging, facet and ligamentous hypertrophy. Similar mild spinal stenosis with asymmetric left lateral recess narrowing. Mild foraminal narrowing bilaterally without L4 nerve root impingement. L5-S1: Mild chronic loss of disc height with annular disc bulging and endplate osteophytes asymmetric to the left. Bilateral facet hypertrophy. Stable mild left foraminal narrowing. The spinal canal and right foramen are patent. IMPRESSION: 1. Motion degraded examination, especially of the cervical spine. 2. Postsurgical changes from recent C3-4 ACDF. No hardware displacement or acute fracture identified. 3. Diffuse effacement of the CSF surrounding the cord  in the cervical spine, similar to prior studies. Possible mild T2 hyperintensity in the cord at C3, suboptimally evaluated due to motion. 4. Given the motion limitations of this examination, consider further evaluation with CT of the cervical spine. 5. No acute findings or significant changes in the thoracic or lumbar spine. 6. 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 7. 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 This is suboptimally evaluated by this examination, although appears grossly similar to previous lumbar MRI and abdominal CTA. See follow up recommendations from abdominal CTA 11/20/2023. Electronically Signed   By: Elsie Perone M.D.   On: 01/16/2024 13:31   DG Chest 1 View Result Date: 01/16/2024 CLINICAL DATA:  Shortness of breath EXAM: CHEST  1 VIEW COMPARISON:  01/12/2024 FINDINGS: Heart and mediastinal contours are within normal limits. No focal opacities or effusions. No acute bony abnormality. Old healed right lower rib fractures. No pneumothorax. IMPRESSION: No active cardiopulmonary disease. Electronically Signed   By: Franky Crease M.D.   On: 01/16/2024 01:59   DG Elbow Complete Left Result Date: 01/16/2024 CLINICAL DATA:  Pain EXAM: LEFT ELBOW - COMPLETE 3+ VIEW COMPARISON:  None Available. FINDINGS: No acute bony abnormality. Specifically, no fracture, subluxation, or dislocation. No joint effusion. IMPRESSION: No acute bony abnormality. Electronically Signed   By: Franky Crease M.D.   On: 01/16/2024 01:58   DG Shoulder Left Result Date: 01/16/2024 CLINICAL DATA:  Pain EXAM: LEFT SHOULDER - 2+ VIEW COMPARISON:  None Available. FINDINGS: Degenerative changes in the [facility] joint with joint space narrowing and spurring. Glenohumeral joint is maintained. No acute bony abnormality. Specifically, no fracture, subluxation, or dislocation. Soft tissues are intact. IMPRESSION: Degenerative changes  in the left AC joint. No acute bony abnormality. Electronically Signed   By: Franky Crease M.D.   On: 01/16/2024  01:58   US  Venous Img Lower Unilateral Left Result Date: 01/13/2024 EXAM: ULTRASOUND DUPLEX OF THE LEFT LOWER EXTREMITY VEINS TECHNIQUE: Duplex ultrasound using B-mode/gray scaled imaging and Doppler spectral analysis and color flow was obtained of the deep venous structures of the left lower extremity. COMPARISON: None. CLINICAL HISTORY: Swelling, DVT. FINDINGS: The common femoral vein, femoral vein, popliteal vein, and posterior tibial vein of the right lower extremity demonstrate normal compressibility with normal color flow and spectral analysis. IMPRESSION: 1. No evidence of DVT. Electronically signed by: Pinkie Pebbles MD 01/13/2024 12:20 AM EDT RP Workstation: [id number]   MR Cervical Spine Wo Contrast Result Date: 01/12/2024 CLINICAL DATA:  Cervical radiculopathy, prior cervical surgery Increased weakness, cervical decompression/fusion 4 days ago. EXAM: MRI CERVICAL SPINE WITHOUT CONTRAST TECHNIQUE: Multiplanar, multisequence MR imaging of the cervical spine was performed. No intravenous contrast was administered. COMPARISON:  MRI cervical spine 11/15/2023. FINDINGS: Alignment: No substantial sagittal subluxation. Vertebrae: Interval C3-C4 ACDF. No specific evidence of acute fracture, discitis/osteomyelitis, or suspicious bone lesion. Motion limited assessment. Cord: Motion limited evaluation without convincing cord signal abnormality. Posterior Fossa, vertebral arteries, paraspinal tissues: Large volume prevertebral edema. Disc levels: C2-C3: Posterior disc osteophyte complex and bilateral facet and uncovertebral hypertrophy without substantial stenosis. C3-C4: Interval ACDF. Persistent effacement of CSF with mildly improved moderate canal stenosis. Bilateral facet uncovertebral hypertrophy with persistent severe left and moderate right foraminal stenosis. C4-C5: Posterior disc osteophyte  complex with bilateral facet and uncovertebral hypertrophy. Moderate to severe right and moderate left foraminal stenosis is unchanged. Moderate canal stenosis. C5-C6: Bilateral facet uncovertebral hypertrophy with moderate to severe right and moderate left foraminal stenosis. Mild canal stenosis. C6-C7: Posterior disc osteophyte complex with left greater than right facet and uncovertebral hypertrophy. Resulting moderate to severe left foraminal stenosis and moderate right foraminal stenosis. Mild canal stenosis. C7-T1: Mild posterior disc osteophyte complex. No significant stenosis. IMPRESSION: 1. Interval C3-C4 ACDF. Persistent effacement of CSF with mildly improved moderate canal stenosis. 2. Large volume prevertebral edema, which is nonspecific and could be postsurgical in etiology but is sterility indeterminate by imaging. 3. At C4-C5, similar moderate to severe right and moderate left foraminal stenosis and moderate canal stenosis. 4. At C5-C6, similar moderate to severe right and moderate left foraminal stenosis and mild canal stenosis. 5. At C6-C7, similar moderate to severe left and moderate right foraminal stenosis and mild canal stenosis. Electronically Signed   By: Gilmore GORMAN Molt M.D.   On: 01/12/2024 22:54   CT Angio Chest PE W and/or Wo Contrast Result Date: 01/12/2024 CLINICAL DATA:  Pulmonary embolism (PE) suspected, high prob. Shortness of breath, weakness EXAM: CT ANGIOGRAPHY CHEST WITH CONTRAST TECHNIQUE: Multidetector CT imaging of the chest was performed using the standard protocol during bolus administration of intravenous contrast. Multiplanar CT image reconstructions and MIPs were obtained to evaluate the vascular anatomy. RADIATION DOSE REDUCTION: This exam was performed according to the departmental dose-optimization program which includes automated exposure control, adjustment of the mA and/or kV according to patient size and/or use of iterative reconstruction technique. CONTRAST:   OMNIPAQUE  IOHEXOL  350 MG/ML SOLN COMPARISON:  01/02/2023 FINDINGS: Cardiovascular: Heart is normal size. Aorta is normal caliber. Diffuse coronary artery and aortic atherosclerosis. Small subsegmental pulmonary embolus noted in the anterior left lower lobe on image 182 of series 6. No right heart strain. Mediastinum/Nodes: No mediastinal, hilar, or axillary adenopathy. Trachea and esophagus are unremarkable. Thyroid  unremarkable. Lungs/Pleura: Mild emphysema. Lungs are clear. No focal airspace opacities or suspicious nodules. No effusions. Upper Abdomen: No acute  findings Musculoskeletal: Chest wall soft tissues are unremarkable. No acute bony abnormality. Review of the MIP images confirms the above findings. IMPRESSION: Small subsegmental left lower lobe pulmonary embolus. Given its small size, clinical relevance is questionable. Diffuse coronary artery disease. Aortic Atherosclerosis (ICD10-I70.0) and Emphysema (ICD10-J43.9). Electronically Signed   By: Franky Crease M.D.   On: 01/12/2024 21:00   DG ELBOW COMPLETE RIGHT (3+VIEW) Result Date: 01/12/2024 EXAM: 3 VIEW(S) XRAY OF THE RIGHT ELBOW COMPARISON: None available. CLINICAL HISTORY: Pt comes via EMS from home with sob. Pt stats he also had fall. Pt neuropathy in hands. Pt has recent procedure for hands. Pt injured rib in past week. Pt stats right elbow skin tear today. Pt has hx of copd. Pt has rhonchi and wheezing in all field per ems. 20 in right wrist; 1 duoneb given with improvements; 195/101; ST 106 with pvcs; 97% RA after duoneb; CBG 85; ETCO2 34 FINDINGS: BONES AND JOINTS: No acute fracture. No focal osseous lesion. No joint dislocation. No joint effusion. SOFT TISSUES: The soft tissues are unremarkable. IMPRESSION: 1. No acute osseous abnormality. Electronically signed by: Pinkie Pebbles MD 01/12/2024 07:21 PM EDT RP Workstation: [id number]   DG Chest 2 View Result Date: 01/12/2024 CLINICAL DATA:  Shortness of breath, weakness. EXAM:  CHEST - 2 VIEW COMPARISON:  04/17/2018 FINDINGS: Mild emphysema. Heart and mediastinal contours are within normal limits. No focal opacities or effusions. No acute bony abnormality. Aortic atherosclerosis. Old healed right rib fractures. IMPRESSION: Emphysema. No acute cardiopulmonary disease. Electronically Signed   By: Franky Crease M.D.   On: 01/12/2024 19:03   DG Cervical Spine 2-3 Views Result Date: 01/08/2024 CLINICAL DATA:  Elective surgery. EXAM: CERVICAL SPINE - 2-3 VIEW COMPARISON:  Preoperative imaging FINDINGS: Two fluoroscopic spot views of the cervical spine submitted from the operating room. Surgical instruments with anterior plate and screw fixation at C3-C4. Fluoroscopy time 9 seconds. Dose 0.77 mGy. IMPRESSION: Intraoperative fluoroscopy during cervical spine surgery. Electronically Signed   By: Andrea Gasman M.D.   On: 01/08/2024 12:38   DG C-Arm 1-60 Min-No Report Result Date: 01/08/2024 Fluoroscopy was utilized by the requesting physician.  No radiographic interpretation.   DG C-Arm 1-60 Min-No Report Result Date: 01/08/2024 Fluoroscopy was utilized by the requesting physician.  No radiographic interpretation.   (Echo, Carotid, EGD, Colonoscopy, ERCP)    Subjective: Pt c/o fatigue   Discharge Exam: Vitals:   01/23/24 0755 01/23/24 1400  BP: (!) 148/88 132/77  Pulse: 76 77  Resp: 18   Temp: 98.2 F (36.8 C)   SpO2: 92%    Vitals:   01/22/24 1943 01/23/24 0505 01/23/24 0755 01/23/24 1400  BP: 118/72 139/88 (!) 148/88 132/77  Pulse: 71 79 76 77  Resp: 18 18 18    Temp: 98.4 F (36.9 C) 98.1 F (36.7 C) 98.2 F (36.8 C)   TempSrc:      SpO2: 95% 90% 92%   Weight:      Height:        General: Pt is alert, awake, not in acute distress Cardiovascular: S1/S2 +, no rubs, no gallops Respiratory: decreased breath sounds b/l  Abdominal: Soft, NT, ND, bowel sounds + Extremities: no edema, no cyanosis    The results of significant diagnostics from this  hospitalization (including imaging, microbiology, ancillary and laboratory) are listed below for reference.     Microbiology: Recent Results (from the past 240 hours)  Resp panel by RT-PCR (RSV, Flu A&B, Covid) Anterior Nasal Swab  Status: None   Collection Time: 01/16/24 10:10 AM   Specimen: Anterior Nasal Swab  Result Value Ref Range Status   SARS Coronavirus 2 by RT PCR NEGATIVE NEGATIVE Final    Comment: (NOTE) SARS-CoV-2 target nucleic acids are NOT DETECTED.  The SARS-CoV-2 RNA is generally detectable in upper respiratory specimens during the acute phase of infection. The lowest concentration of SARS-CoV-2 viral copies this assay can detect is 138 copies/mL. A negative result does not preclude SARS-Cov-2 infection and should not be used as the sole basis for treatment or other patient management decisions. A negative result may occur with  improper specimen collection/handling, submission of specimen other than nasopharyngeal swab, presence of viral mutation(s) within the areas targeted by this assay, and inadequate number of viral copies(<138 copies/mL). A negative result must be combined with clinical observations, patient history, and epidemiological information. The expected result is Negative.  Fact Sheet for Patients:  BloggerCourse.com  Fact Sheet for Healthcare Providers:  SeriousBroker.it  This test is no t yet approved or cleared by the [place]  FDA and  has been authorized for detection and/or diagnosis of SARS-CoV-2 by FDA under an Emergency Use Authorization (EUA). This EUA will remain  in effect (meaning this test can be used) for the duration of the COVID-19 declaration under Section 564(b)(1) of the Act, 21 U.S.C.section 360bbb-3(b)(1), unless the authorization is terminated  or revoked sooner.       Influenza A by PCR NEGATIVE NEGATIVE Final   Influenza B by PCR NEGATIVE NEGATIVE Final     Comment: (NOTE) The Xpert Xpress SARS-CoV-2/FLU/RSV plus assay is intended as an aid in the diagnosis of influenza from Nasopharyngeal swab specimens and should not be used as a sole basis for treatment. Nasal washings and aspirates are unacceptable for Xpert Xpress SARS-CoV-2/FLU/RSV testing.  Fact Sheet for Patients: BloggerCourse.com  Fact Sheet for Healthcare Providers: SeriousBroker.it  This test is not yet approved or cleared by the [place]  FDA and has been authorized for detection and/or diagnosis of SARS-CoV-2 by FDA under an Emergency Use Authorization (EUA). This EUA will remain in effect (meaning this test can be used) for the duration of the COVID-19 declaration under Section 564(b)(1) of the Act, 21 U.S.C. section 360bbb-3(b)(1), unless the authorization is terminated or revoked.     Resp Syncytial Virus by PCR NEGATIVE NEGATIVE Final    Comment: (NOTE) Fact Sheet for Patients: BloggerCourse.com  Fact Sheet for Healthcare Providers: SeriousBroker.it  This test is not yet approved or cleared by the [place]  FDA and has been authorized for detection and/or diagnosis of SARS-CoV-2 by FDA under an Emergency Use Authorization (EUA). This EUA will remain in effect (meaning this test can be used) for the duration of the COVID-19 declaration under Section 564(b)(1) of the Act, 21 U.S.C. section 360bbb-3(b)(1), unless the authorization is terminated or revoked.  Performed at [facility], [address] [place], [place] [address]      Labs: BNP (last 3 results) Recent Labs    01/15/24 2345  BNP 52.0   Basic Metabolic Panel: Recent Labs  Lab 01/18/24 0402 01/19/24 0542 01/20/24 0354 01/21/24 0850 01/23/24 0418  NA 128* 129* 128* 131* 133*  K 3.2* 3.5 3.5 4.0 4.1  CL 91* 93* 93* 95* 101  CO2 27 27 26 25 24   GLUCOSE 94 110* 108* 103* 92   BUN 13 15 22 18 23   CREATININE 0.45* 0.45* 0.64 0.59* 0.65  CALCIUM  8.9 8.8* 8.4* 8.5* 8.6*  MG 1.8  --   --   --   --  Liver Function Tests: No results for input(s): AST, ALT, ALKPHOS, BILITOT, PROT, ALBUMIN  in the last 168 hours. No results for input(s): LIPASE, AMYLASE in the last 168 hours. No results for input(s): AMMONIA in the last 168 hours. CBC: No results for input(s): WBC, NEUTROABS, HGB, HCT, MCV, PLT in the last 168 hours. Cardiac Enzymes: No results for input(s): CKTOTAL, CKMB, CKMBINDEX, TROPONINI in the last 168 hours. BNP: Invalid input(s): POCBNP CBG: No results for input(s): GLUCAP in the last 168 hours. D-Dimer No results for input(s): DDIMER in the last 72 hours. Hgb A1c No results for input(s): HGBA1C in the last 72 hours. Lipid Profile No results for input(s): CHOL, HDL, LDLCALC, TRIG, CHOLHDL, LDLDIRECT in the last 72 hours. Thyroid  function studies No results for input(s): TSH, T4TOTAL, T3FREE, THYROIDAB in the last 72 hours.  Invalid input(s): FREET3 Anemia work up No results for input(s): VITAMINB12, FOLATE, FERRITIN, TIBC, IRON, RETICCTPCT in the last 72 hours. Urinalysis    Component Value Date/Time   COLORURINE YELLOW (A) 01/16/2024 0720   APPEARANCEUR CLEAR (A) 01/16/2024 0720   LABSPEC 1.004 (L) 01/16/2024 0720   PHURINE 6.0 01/16/2024 0720   GLUCOSEU NEGATIVE 01/16/2024 0720   HGBUR NEGATIVE 01/16/2024 0720   BILIRUBINUR NEGATIVE 01/16/2024 0720   KETONESUR 5 (A) 01/16/2024 0720   PROTEINUR NEGATIVE 01/16/2024 0720   NITRITE NEGATIVE 01/16/2024 0720   LEUKOCYTESUR NEGATIVE 01/16/2024 0720   Sepsis Labs No results for input(s): WBC in the last 168 hours.  Invalid input(s): PROCALCITONIN, LACTICIDVEN Microbiology Recent Results (from the past 240 hours)  Resp panel by RT-PCR (RSV, Flu A&B, Covid) Anterior Nasal Swab     Status: None   Collection  Time: 01/16/24 10:10 AM   Specimen: Anterior Nasal Swab  Result Value Ref Range Status   SARS Coronavirus 2 by RT PCR NEGATIVE NEGATIVE Final    Comment: (NOTE) SARS-CoV-2 target nucleic acids are NOT DETECTED.  The SARS-CoV-2 RNA is generally detectable in upper respiratory specimens during the acute phase of infection. The lowest concentration of SARS-CoV-2 viral copies this assay can detect is 138 copies/mL. A negative result does not preclude SARS-Cov-2 infection and should not be used as the sole basis for treatment or other patient management decisions. A negative result may occur with  improper specimen collection/handling, submission of specimen other than nasopharyngeal swab, presence of viral mutation(s) within the areas targeted by this assay, and inadequate number of viral copies(<138 copies/mL). A negative result must be combined with clinical observations, patient history, and epidemiological information. The expected result is Negative.  Fact Sheet for Patients:  BloggerCourse.com  Fact Sheet for Healthcare Providers:  SeriousBroker.it  This test is no t yet approved or cleared by the [place]  FDA and  has been authorized for detection and/or diagnosis of SARS-CoV-2 by FDA under an Emergency Use Authorization (EUA). This EUA will remain  in effect (meaning this test can be used) for the duration of the COVID-19 declaration under Section 564(b)(1) of the Act, 21 U.S.C.section 360bbb-3(b)(1), unless the authorization is terminated  or revoked sooner.       Influenza A by PCR NEGATIVE NEGATIVE Final   Influenza B by PCR NEGATIVE NEGATIVE Final    Comment: (NOTE) The Xpert Xpress SARS-CoV-2/FLU/RSV plus assay is intended as an aid in the diagnosis of influenza from Nasopharyngeal swab specimens and should not be used as a sole basis for treatment. Nasal washings and aspirates are unacceptable for Xpert Xpress  SARS-CoV-2/FLU/RSV testing.  Fact Sheet for Patients: BloggerCourse.com  Fact Sheet for Healthcare Providers: SeriousBroker.it  This test is not yet approved or cleared by the [place]  FDA and has been authorized for detection and/or diagnosis of SARS-CoV-2 by FDA under an Emergency Use Authorization (EUA). This EUA will remain in effect (meaning this test can be used) for the duration of the COVID-19 declaration under Section 564(b)(1) of the Act, 21 U.S.C. section 360bbb-3(b)(1), unless the authorization is terminated or revoked.     Resp Syncytial Virus by PCR NEGATIVE NEGATIVE Final    Comment: (NOTE) Fact Sheet for Patients: BloggerCourse.com  Fact Sheet for Healthcare Providers: SeriousBroker.it  This test is not yet approved or cleared by the [place]  FDA and has been authorized for detection and/or diagnosis of SARS-CoV-2 by FDA under an Emergency Use Authorization (EUA). This EUA will remain in effect (meaning this test can be used) for the duration of the COVID-19 declaration under Section 564(b)(1) of the Act, 21 U.S.C. section 360bbb-3(b)(1), unless the authorization is terminated or revoked.  Performed at [facility], [address] [place], [place] [address]      Time coordinating discharge: 37 minutes  SIGNED:   Anthony CHRISTELLA Pouch, MD  [facility] 01/23/2024, 3:09 PM Pager   If 7PM-7AM, please contact night-coverage [email or web address]

## 2024-01-23 NOTE — Plan of Care (Signed)

## 2024-01-23 NOTE — TOC Transition Note (Signed)
 Transition of Care [facility]) - Discharge Note   Patient Details  Name: Scott [name] MRN: [id number] Date of Birth: [birth year]  [facility]) CM/SW Contact:  Dalia GORMAN Fuse, RN Phone Number: 01/23/2024, 2:58 PM   Clinical Narrative:     Patient is medically clear to discharge to [facility] for STR. [facility] will transport.  Nurse to call report to [facility] [phone]. RM 128 A   Final next level of care: Skilled Nursing Facility Barriers to Discharge: Barriers Resolved   Patient Goals and CMS Choice            Discharge Placement              Patient chooses bed at:  ([facility]) Patient to be transferred to facility by: [facility]      Discharge Plan and Services Additional resources added to the After Visit Summary for                                       Social Drivers of Health (SDOH) Interventions SDOH Screenings   Food Insecurity: No Food Insecurity (01/16/2024)  Housing: Low Risk  (01/16/2024)  Transportation Needs: No Transportation Needs (01/16/2024)  Utilities: Not At Risk (01/16/2024)  Depression (PHQ2-9): Medium Risk (10/30/2023)  Financial Resource Strain: High Risk (10/07/2023)   Received from [facility]  Physical Activity: Inactive (10/07/2023)   Received from [facility]  Social Connections: Socially Isolated (01/16/2024)  Stress: Stress Concern Present (10/07/2023)   Received from [facility]  Tobacco Use: High Risk (01/16/2024)  Health Literacy: Inadequate Health Literacy (10/07/2023)   Received from [facility]     Readmission Risk Interventions     No data to display

## 2024-01-23 NOTE — Progress Notes (Signed)
 Mobility Specialist - Progress Note Pre-mobility: HR-76, BP-138/83, SpO2-96%   01/23/24 1100  Mobility  Activity Ambulated with assistance  Level of Assistance Contact guard assist, steadying assist  Assistive Device Front wheel walker  Distance Ambulated (ft) 125 ft  Range of Motion/Exercises Active  Activity Response Tolerated well  Mobility visit 1 Mobility  Mobility Specialist Start Time (ACUTE ONLY) 1110  Mobility Specialist Stop Time (ACUTE ONLY) 1138  Mobility Specialist Time Calculation (min) (ACUTE ONLY) 28 min   Pt was in the recliner with guest in the room upon entry. Pt agreed to mobility. Pt did state that he is experiencing restrictive mobility in his left arm called it carpal tunnel . Pre vital were taken as a precaution. Pt was able to STS with 2 WW and CGA. Both hand were able to extend to hold 2 [facility] effectively. Pt ambulated well. Pt needs constant cues for gait ans well as direction. Pt cues for proper technique with a 2WW to maintain contact with the ground. After activity pt returned to the recliner with needs in reach and alarm on.23

## 2024-01-23 NOTE — TOC Progression Note (Signed)
 Transition of Care [facility]) - Progression Note    Patient Details  Name: Scott [name] MRN: [id number] Date of Birth: 07-28-1960  Transition of Care [facility]) CM/SW Contact  Dalia GORMAN Fuse, RN Phone Number: 01/23/2024, 2:49 PM  Clinical Narrative:    Approved Certification#: [id number]                      Expected Discharge Plan and Services                                               Social Drivers of Health (SDOH) Interventions SDOH Screenings   Food Insecurity: No Food Insecurity (01/16/2024)  Housing: Low Risk  (01/16/2024)  Transportation Needs: No Transportation Needs (01/16/2024)  Utilities: Not At Risk (01/16/2024)  Depression (PHQ2-9): Medium Risk (10/30/2023)  Financial Resource Strain: High Risk (10/07/2023)   Received from [facility]  Physical Activity: Inactive (10/07/2023)   Received [facility]  Social Connections: Socially Isolated (01/16/2024)  Stress: Stress Concern Present (10/07/2023)   Received from [facility]  Tobacco Use: High Risk (01/16/2024)  Health Literacy: Inadequate Health Literacy (10/07/2023)   Received from [facility]    Readmission Risk Interventions     No data to display

## 2024-01-26 ENCOUNTER — Encounter: Admitting: Physician Assistant

## 2024-01-27 ENCOUNTER — Ambulatory Visit: Payer: Self-pay

## 2024-01-27 ENCOUNTER — Ambulatory Visit

## 2024-01-27 NOTE — Telephone Encounter (Signed)
 FYI Only or Action Required?: Action required by provider: medication refill request and clinical question for provider.  Patient was last seen in primary care on 12/29/2023 by Zafirov, Clarissa A, MD.  Called Nurse Triage reporting Dysuria.  Symptoms began several days ago.  Interventions attempted: Other: cranberry juice.  Symptoms are: gradually worsening.  Triage Disposition: See Physician Within 24 Hours  Patient/caregiver understands and will follow disposition?: No, refuses disposition   Pts ttates he is disabled and unable to come in for an appt. RN explained why appt is the best option to see if he has an infection and test for appropriate medications. Pt stated understanding but states he is unable to come to appts.    Copied from CRM [id number]. Topic: Clinical - Red Word Triage >> Jan 27, 2024  9:39 AM Willma SAUNDERS wrote: Red Word that prompted transfer to Nurse Triage: Patient thinks he has a UTI, has burning with urination. Reason for Disposition  All other males with painful urination  Answer Assessment - Initial Assessment Questions PT states that he was in the hospital for 8 days and had a condom catheter on. He states 1-2 days after leaving he started to have slight symptoms but last night pain increased. He states he is also having increased urinary frequency. Rn advised an appt and pt states she has no transportation and is severely disabled and can't leave the house. Pt is drinking cranberry juice and is going to try azo in the meantime.     1. SEVERITY: How bad is the pain?  (e.g., Scale 1-10; mild, moderate, or severe)     9/10  3. PATTERN: Is pain present every time you urinate or just sometimes?      Almost every time 4. ONSET: When did the painful urination start?      1-2 days after leaving the hospital 5. FEVER: Do you have a fever? If Yes, ask: What is your temperature, how was it measured, and when did it start?   no 6. PAST UTI: Have you had  a urine infection before? If Yes, ask: When was the last time? and What happened that time?      no 7. CAUSE: What do you think is causing the painful urination?      condom 8. OTHER SYMPTOMS: Do you have any other symptoms? (e.g., flank pain, penis discharge, scrotal pain, blood in urine)     none  Protocols used: Urination Pain - Male-A-AH

## 2024-01-28 ENCOUNTER — Other Ambulatory Visit: Payer: Self-pay

## 2024-01-28 ENCOUNTER — Telehealth (INDEPENDENT_AMBULATORY_CARE_PROVIDER_SITE_OTHER)

## 2024-01-28 DIAGNOSIS — J449 Chronic obstructive pulmonary disease, unspecified: Secondary | ICD-10-CM

## 2024-01-28 DIAGNOSIS — R531 Weakness: Secondary | ICD-10-CM | POA: Diagnosis not present

## 2024-01-28 DIAGNOSIS — F17200 Nicotine dependence, unspecified, uncomplicated: Secondary | ICD-10-CM | POA: Diagnosis not present

## 2024-01-28 DIAGNOSIS — R300 Dysuria: Secondary | ICD-10-CM

## 2024-01-28 DIAGNOSIS — F1010 Alcohol abuse, uncomplicated: Secondary | ICD-10-CM | POA: Diagnosis not present

## 2024-01-28 MED ORDER — CEPHALEXIN 500 MG PO CAPS: 500.0000 mg | ORAL_CAPSULE | Freq: Three times a day (TID) | ORAL | 0 refills | Status: DC

## 2024-01-28 NOTE — Progress Notes (Signed)
 Progress Note  Physician: Verland Sprinkle A Taelon Bendorf, MD   Patient contacted 01/28/24 at 10:40 AM EDT by a video enabled telemedicine application and verified that I am speaking with the correct person.   Patient is aware of limitations of evaluation by telemedicine The patient expressed understanding and agreed to proceed.   HPI: Scott [name] is a 63 y.o. male presenting on 01/28/2024 for No chief complaint on file. . Discussed the use of AI scribe software for clinical note transcription with the patient, who gave verbal consent to proceed.  History of Present Illness   Scott [name] is a 63 year old male with severe COPD and recent cervical spine surgery who presents for hospital follow-up after a fall.  Postoperative status and fall - Discharged from hospital on October 3rd following cervical spine surgery for stenosis - Four days post-surgery, experienced a fall resulting in worsening weakness in upper and lower extremities - Presented to emergency department for evaluation; labs and x-rays performed as part of fall workup.  He was then admitted for several days  - PT/OT he reports will becoming to do home visits  Urinary symptoms/Dysuria - Burning sensation during urination for the past three to four days - Taking Azo for urinary discomfort - No fever or chills, back pain, nausea or vomiting  Chronic obstructive pulmonary disease (copd) symptoms - Severe COPD - Continues to smoke daily about 1 PPD, smoking indoors - No cough or SOB - continues on Symbicort  Hypertension - History of hypertension - Currently taking lisinopril  20 mg and metoprolol  - Monitoring blood pressure at home previously, but BP machine difficulty with operation  - Home nursing scheduled to come to the home  Alcohol  use - History of alcohol  abuse - Consuming four alcoholic beverages daily - beer.  This is a decrease from previous  - Weight loss since surgery, from 156 pounds          Social history:  Relevant past medical, surgical, family and social history reviewed and updated as indicated. Interim medical history since our last visit reviewed.  Allergies and medications reviewed and updated.  DATA REVIEWED: CHART IN EPIC  ROS: Negative unless specifically indicated above in HPI.    Current Outpatient Medications:    cephALEXin  (KEFLEX ) 500 MG capsule, Take 1 capsule (500 mg total) by mouth 3 (three) times daily. For 7 days, Disp: 21 capsule, Rfl: 0   acetaminophen  (TYLENOL ) 500 MG tablet, Take 2 tablets (1,000 mg total) by mouth every 6 (six) hours as needed., Disp: 30 tablet, Rfl: 0   APIXABAN  (ELIQUIS ) VTE STARTER PACK (10MG  AND 5MG ), Take as directed on package: start with two-5mg  tablets twice daily for 7 days. On day 8, switch to one-5mg  tablet twice daily., Disp: 74 each, Rfl: 0   atorvastatin  (LIPITOR) 40 MG tablet, Take 40 mg by mouth daily. (Patient taking differently: Take 20 mg by mouth every morning.), Disp: , Rfl:    budesonide-formoterol  (SYMBICORT) 160-4.5 MCG/ACT inhaler, Inhale 2 puffs into the lungs 2 (two) times daily., Disp: , Rfl:    cholecalciferol (VITAMIN D3) 25 MCG (1000 UNIT) tablet, Take 1,000 Units by mouth daily., Disp: , Rfl:    cloNIDine  (CATAPRES ) 0.1 MG tablet, Take 0.1-0.2 mg by mouth See admin instructions. 0.2 mg every morning. If bp elevated later in the day, pt will take 0.1 mg tablet if needed, Disp: , Rfl:    docusate sodium  (COLACE) 100 MG capsule,  Take 1 capsule (100 mg total) by mouth 2 (two) times daily., Disp: 10 capsule, Rfl: 0   ipratropium-albuterol  (DUONEB) 0.5-2.5 (3) MG/3ML SOLN, Inhale 3 mLs into the lungs every 6 (six) hours as needed., Disp: , Rfl:    lisinopril  (ZESTRIL ) 20 MG tablet, Take 1 tablet (20 mg total) by mouth daily., Disp: 30 tablet, Rfl: 0   metoprolol  succinate (TOPROL -XL) 100 MG 24 hr tablet, Take 100 mg by mouth every morning. Take with or immediately following a meal., Disp: , Rfl:     nicotine  polacrilex (COMMIT) 4 MG lozenge, Take 4 mg by mouth as needed., Disp: , Rfl:    ondansetron  (ZOFRAN ) 4 MG tablet, Take 1 tablet (4 mg total) by mouth every 8 (eight) hours as needed for nausea or vomiting., Disp: 20 tablet, Rfl: 0   Potassium 99 MG TABS, Take 1 tablet by mouth daily at 6 (six) AM., Disp: , Rfl:    pregabalin  (LYRICA ) 50 MG capsule, Take 1 capsule (50 mg total) by mouth 2 (two) times daily., Disp: 60 capsule, Rfl: 1   senna (SENOKOT) 8.6 MG TABS tablet, Take 1 tablet (8.6 mg total) by mouth daily., Disp: 120 tablet, Rfl: 0   tiZANidine  (ZANAFLEX ) 4 MG tablet, Take 0.5-1 tablets (2-4 mg total) by mouth every 8 (eight) hours as needed., Disp: 90 tablet, Rfl: 1   vitamin B-12 (CYANOCOBALAMIN ) 500 MCG tablet, Take 500 mcg by mouth daily., Disp: , Rfl:       Objective:  Telephone visit     Assessment & Plan:  Tobacco use disorder  Generalized weakness  Chronic obstructive pulmonary disease, unspecified COPD type (HCC)  Alcohol  abuse  Dysuria -     Cephalexin ; Take 1 capsule (500 mg total) by mouth 3 (three) times daily. For 7 days  Dispense: 21 capsule; Refill: 0  Assessment and Plan    Postoperative cervical spine stenosis Recent surgery with fall causing increased weakness in extremities. n-person neurosurgical evaluation needed as he reports locking up of UE and LE.  - Call neurosurgeon for follow-up appointment. - Start physical therapy.  Urinary tract infection Burning urination for several days. Antibiotic treatment started. - Cephalexin  500 mg TID x 7 days - Instruct to start antibiotic immediately. - Advise to report worsening symptoms or fever and chills.  Severe chronic obstructive pulmonary disease (COPD) and Nicotine  dependence Severe COPD with continued smoking indoors worsening lung condition and healing. - Advise smoking cessation or at least smoking outdoors.  Continue symbicort  Hypertension Hypertension managed with lisinopril  and  metoprolol . Postoperative monitoring required due to potential pain-induced elevation. - Have nursing staff check blood pressure at home. - Ensure nursing staff sends blood pressure notes to provider.  Peripheral vascular disease Smoking and alcohol  use contribute to poor healing and increased risk of nonhealing wounds. - Advise cautious approach to interventions due to risk of nonhealing wounds.  Alcohol  use disorder Continues daily alcohol  consumption, affecting healing and health. - Encourage reduction in alcohol  consumption.     F/u in 4 weeks    No follow-ups on file.

## 2024-01-29 ENCOUNTER — Other Ambulatory Visit: Payer: Self-pay

## 2024-01-29 NOTE — Patient Outreach (Signed)
 Complex Care Management   Visit Note  01/29/2024  Name:  Scott [name] MRN: [id number] DOB: Nov 08, 1960  Situation: Referral received for Complex Care Management related to SDOH Barriers:  Food insecurity Lack of essential utilities power PCS I obtained verbal consent from Patient.  Visit completed with Patient  on the phone  Background:   Past Medical History:  Diagnosis Date   AAA (abdominal aortic aneurysm) without rupture 02/23/2020   a.) AAA duplex 02/23/2020: 3.8 cm; b.) AAA duplex 11/28/2020: 3.7 cm; c.) CTA AO + BIFEM 12/18/2020: 3.8 x 3.4 cm; d.) AAA duplex 11/07/2021: 3.7 cm; e.) CTA AO + BIFEM 11/16/2021: 4.2 cm; f.) AAA duplex 05/28/2022: 4.0 cm; g.) AAA duplex 11/26/2022: 4.4 cm; h.) PET CT 10/01/2022: 4.3 cm; i.) AAA duplex 08/06/2023: 4.8 cm; j.) CTA abd/pel 11/20/2023: 4.8 x 4.6 cm   Alcoholism (HCC)    Anginal pain    Anxiety    Aortic atherosclerosis    Aortic root dilatation    Arthritis    Bilateral carotid artery disease 06/20/2020   a.) carotid doppler 06/20/2020: 1-39% RICA, 40-59% LICA; b.) carotid doppler 11/28/2020, 11/26/2022: 1-39% BICA   Bilateral inguinal hernia    CAD (coronary artery disease) 06/03/2017   a.) LHC 06/03/2017: 25% pLAD - med mgmt; b.) LHC 08/22/2021: 25% pLAD, 15% o-mLM, 25% dLM-pLAD, 25% o-mLCx, 25% oD2 - med mgmt   Carpal tunnel syndrome    Cigarette smoker    COPD (chronic obstructive pulmonary disease) (HCC)    COVID-19 2022   Deep vein thrombosis (DVT) of left lower extremity (HCC)    Depression    Diastolic dysfunction    Diverticulosis    Dupuytren contracture of both hands    Dyspnea    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    a.) small area of chronic encephalomalacia in the LEFT inferior temporal gyrus   GERD (gastroesophageal reflux disease)    Hepatic steatosis    HLD (hyperlipidemia)    Hypertension    Incomplete left bundle branch block (LBBB)    Lung nodules    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    a.) PET CT 10/01/2022: 6 mm with SUV max of 6.4; b.) s.p FNA 11/05/2022 --> pathology negative for malignancy (DDx oncocytosis vs oncocytoma)   PAOD (peripheral arterial occlusive disease)    a.) s/p BILATERAL femoral endarterectomies 12/29/2020; b.) s/p aorto-bifemoral bypass   Peripheral edema 03/19/2021   Pre-diabetes    Stenosis of cervical spine with myelopathy (HCC)    SVT (supraventricular tachycardia)    a.) s/p ablation 02/28/2016   Vitamin B12 deficiency    Vitamin D deficiency     Assessment: SW completed a telephone outreach with patient, he states he is needing a nurse to come in his home and assist with wound changes. He states he has a caregiver starting on Monday paying $400 per week. She will assist with bathing, errands, and housekeeping. Patient does receive SSDI and does not receive foodstamps. SW and patient agreed for resources for food and utilities to be emailed to address on file.  Patient has family planning and commercial insurance with [employer]. SW will contact [employer] to check eligibility for PCS for in home aide   SDOH Interventions    Flowsheet Row Patient Outreach Telephone from 01/29/2024 in [place] [facility]PART[facility]  SDOH Interventions   Food Insecurity Interventions Community Resources Provided     Recommendation:   No recommendations at this time  Follow Up Plan:   Telephone follow-up  02/18/24 at 1pm  Thersia Hoar, BSW, MHA [place]  [facility] Social Worker, Population Health [phone]

## 2024-01-29 NOTE — Patient Outreach (Signed)
 Complex Care Management   Visit Note  01/29/2024  Name:  Scott [name] MRN: [id number] DOB: 04/10/1961  Situation: Referral received for Complex Care Management related to Impaired Mobility. I obtained verbal consent from Patient.  Visit completed with Scott [name]  on the phone. Had to keep call brief, patient was leaving the house to pick up antibiotic from pharmacy, relying on a friend for transportation. Scott [name] was in the hospital 01/16/24 due to weakness post surgery for Cervical Decompression/Discectomy Fusion on 01/08/24.  He was discharged to SNF on 01/23/24 but upon arrival, there were multiple issues that lead him to make the decision to go home (broken bathroom, people with COVID in the building, unclean environment, facility had a rating of 85.7) States he will be having a private paid caregiver who can come in to help with ADL's.  He is not aware of any home health for PT/OT orders. He had a telehealth visit with Dr. Zafirov, appears she may have ordered HHPT/OT.   Background:   Past Medical History:  Diagnosis Date   AAA (abdominal aortic aneurysm) without rupture 02/23/2020   a.) AAA duplex 02/23/2020: 3.8 cm; b.) AAA duplex 11/28/2020: 3.7 cm; c.) CTA AO + BIFEM 12/18/2020: 3.8 x 3.4 cm; d.) AAA duplex 11/07/2021: 3.7 cm; e.) CTA AO + BIFEM 11/16/2021: 4.2 cm; f.) AAA duplex 05/28/2022: 4.0 cm; g.) AAA duplex 11/26/2022: 4.4 cm; h.) PET CT 10/01/2022: 4.3 cm; i.) AAA duplex 08/06/2023: 4.8 cm; j.) CTA abd/pel 11/20/2023: 4.8 x 4.6 cm   Alcoholism (HCC)    Anginal pain    Anxiety    Aortic atherosclerosis    Aortic root dilatation    Arthritis    Bilateral carotid artery disease 06/20/2020   a.) carotid doppler 06/20/2020: 1-39% RICA, 40-59% LICA; b.) carotid doppler 11/28/2020, 11/26/2022: 1-39% BICA   Bilateral inguinal hernia    CAD (coronary artery disease) 06/03/2017   a.) LHC 06/03/2017: 25% pLAD - med mgmt; b.) LHC 08/22/2021: 25% pLAD, 15% o-mLM, 25% dLM-pLAD, 25%  o-mLCx, 25% oD2 - med mgmt   Carpal tunnel syndrome    Cigarette smoker    COPD (chronic obstructive pulmonary disease) (HCC)    COVID-19 2022   Deep vein thrombosis (DVT) of left lower extremity (HCC)    Depression    Diastolic dysfunction    Diverticulosis    Dupuytren contracture of both hands    Dyspnea    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    a.) small area of chronic encephalomalacia in the LEFT inferior temporal gyrus   GERD (gastroesophageal reflux disease)    Hepatic steatosis    HLD (hyperlipidemia)    Hypertension    Incomplete left bundle branch block (LBBB)    Lung nodules    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   a.) PET CT 10/01/2022: 6 mm with SUV max of 6.4; b.) s.p FNA 11/05/2022 --> pathology negative for malignancy (DDx oncocytosis vs oncocytoma)   PAOD (peripheral arterial occlusive disease)    a.) s/p BILATERAL femoral endarterectomies 12/29/2020; b.) s/p aorto-bifemoral bypass   Peripheral edema 03/19/2021   Pre-diabetes    Stenosis of cervical spine with myelopathy (HCC)    SVT (supraventricular tachycardia)    a.) s/p ablation 02/28/2016   Vitamin B12 deficiency    Vitamin D deficiency     Assessment: Patient Reported Symptoms:  Cognitive Cognitive Status: Alert and oriented to person, place, and time, Insightful and able to interpret abstract concepts, Normal speech and language skills Cognitive/Intellectual  Conditions Management [RPT]: None reported or documented in medical history or problem list      Neurological Neurological Review of Symptoms: No symptoms reported    HEENT HEENT Symptoms Reported: Not assessed      Cardiovascular Cardiovascular Symptoms Reported: No symptoms reported    Respiratory Respiratory Symptoms Reported: No symptoms reported    Endocrine Endocrine Symptoms Reported: No symptoms reported Is patient diabetic?: No    Gastrointestinal Gastrointestinal Symptoms Reported: Diarrhea Additional  Gastrointestinal Details: Reports AZO gave him diarrhea, he is drinking electrolytes to replace fluid loss.      Genitourinary Genitourinary Symptoms Reported: Pain/burning with urination Additional Genitourinary Details: Patient is picking up antibiotics today that was prescribed by PCP, Cephalexin  500 mg TID x 7 days.    Integumentary Integumentary Symptoms Reported: Not assessed    Musculoskeletal Musculoskelatal Symptoms Reviewed: Difficulty walking, Limited mobility Additional Musculoskeletal Details: Using cane/walker, has wheelchair for longer distances   Falls in the past year?: Yes Number of falls in past year: 2 or more    Psychosocial Psychosocial Symptoms Reported: Not assessed          01/29/2024    PHQ2-9 Depression Screening   Little interest or pleasure in doing things    Feeling down, depressed, or hopeless    PHQ-2 - Total Score    Trouble falling or staying asleep, or sleeping too much    Feeling tired or having little energy    Poor appetite or overeating     Feeling bad about yourself - or that you are a failure or have let yourself or your family down    Trouble concentrating on things, such as reading the newspaper or watching television    Moving or speaking so slowly that other people could have noticed.  Or the opposite - being so fidgety or restless that you have been moving around a lot more than usual    Thoughts that you would be better off dead, or hurting yourself in some way    PHQ2-9 Total Score    If you checked off any problems, how difficult have these problems made it for you to do your work, take care of things at home, or get along with other people    Depression Interventions/Treatment      There were no vitals filed for this visit.  Medications Reviewed Today   Medications were not reviewed in this encounter     Recommendation:   Thersia Hoar, BSW phone appt 01/29/24 Specialty provider follow-up :Vascular 02/06/24;  Neurosurgeon  02/16/24; Pulmonary 02/19/24; Rheumatology 02/24/24; Podiatry 02/25/24; Cardiology 04/07/24 Referral to: home health PT/OT DME requests:  bedside commode This RNCM will check with PCP to see if HHPT/OT was ordered due to patient leaving SNF on his own.   Follow Up Plan:   Telephone follow-up 2 days   Santana Stamp BSN, CCM [place]  [facility] Population Health RN Care Manager Direct Dial: [phone]  Fax: [phone]

## 2024-01-29 NOTE — Patient Instructions (Signed)
 Visit Information  Thank you for taking time to visit with me today. Please don't hesitate to contact me if I can be of assistance to you before our next scheduled appointment.  Our next appointment is by telephone on Friday, October 10th at 1:00pm  Please call the care guide team at [phone] if you need to cancel or reschedule your appointment.   Following is a copy of your care plan:   Goals Addressed             This Visit's Progress    VBCI RN Care Plan       Problems:  Chronic Disease Management support and education needs related to Impaired Mobility   Goal: Over the next 3 months the Patient will demonstrate Improved health management independence as evidenced by no falls        Over the next 3 weeks, patient will attend Neurosurgery appt on 02/16/24 AEB OV recorded in [facility] electronic record Over the next 5 days the patient will have plan for safety within the home AEB home health PT/OT has been scheduled for evaluation   Interventions:   Falls Interventions: Advised patient of importance of notifying provider of falls Assessed for equipment needs in the home, states he has a cane, walker, wheelchair, shower stool.  Reports he needs bedside commode.   Patient Self-Care Activities:  Call provider office for new concerns or questions  Work with the social worker to address care coordination needs and will continue to work with the clinical team to address health care and disease management related needs  Plan:  Telephone follow up appointment with care management team member scheduled for:  two days             Please call the [place]  National Suicide Prevention Lifeline: (613)322-2390 or TTY: 2196469952 TTY 564-418-2554) to talk to a trained counselor if you are experiencing a Mental Health or Behavioral Health Crisis or need someone to talk to.  Patient verbalizes understanding of instructions and care plan provided today and agrees to view in MyChart.  Active MyChart status and patient understanding of how to access instructions and care plan via MyChart confirmed with patient.     Santana Stamp BSN, CCM [place]  [facility] Population Health RN Care Manager Direct Dial: [phone]  Fax: [phone]

## 2024-01-29 NOTE — Patient Instructions (Signed)
 Visit Information  Thank you for taking time to visit with me today. Please don't hesitate to contact me if I can be of assistance to you before our next scheduled appointment.  Our next appointment is by telephone on 02/18/24 at 1pm Please call the care guide team at [phone] if you need to cancel or reschedule your appointment.   Following is a copy of your care plan:   Goals Addressed             This Visit's Progress    BSW VBCI Social Work Care Plan       Problems:   Nurse support in home support/PCS  CSW Clinical Goal(s):   Over the next 30 days the Patient will work with Child psychotherapist to address concerns related to [employer] .  Interventions:  SW will contact insurance to check for eligibility of PCS SW will mail resources for food and utilities  Patient Goals/Self-Care Activities:  Patient will utilize resources SW sent.  Plan:   The care management team will reach out to the patient again over the next 15 days.        Please call the Suicide and Crisis Lifeline: 988 call the [place]  National Suicide Prevention Lifeline: (581)431-6159 or TTY: (518)618-0699 TTY (786)315-7395) to talk to a trained counselor call 1-800-273-TALK (toll free, 24 hour hotline) call 911 if you are experiencing a Mental Health or Behavioral Health Crisis or need someone to talk to.  Patient verbalizes understanding of instructions and care plan provided today and agrees to view in MyChart. Active MyChart status and patient understanding of how to access instructions and care plan via MyChart confirmed with patient.     Thersia Hoar, HEDWIG, MHA [place]  [facility] Social Worker, Population Health [phone]

## 2024-01-30 ENCOUNTER — Other Ambulatory Visit: Payer: Self-pay

## 2024-01-30 ENCOUNTER — Telehealth: Payer: Self-pay

## 2024-01-30 ENCOUNTER — Ambulatory Visit: Payer: Self-pay

## 2024-01-30 DIAGNOSIS — W19XXXS Unspecified fall, sequela: Secondary | ICD-10-CM

## 2024-01-30 NOTE — Patient Outreach (Signed)
 Complex Care Management   Visit Note  01/30/2024  Name:  Scott [name] MRN: [id number] DOB: 17-Jul-1960  Situation: Referral received for Complex Care Management related to SDOH Barriers:  PCS and Impaired [place]. I obtained verbal consent from Patient.  Visit completed with Scott [name]  on the phone. Patient reports four falls on 01/29/24, denies hitting his head.  He is in need of home health PT/OT/RN/aide, he left SNF AMA on 01/23/24 with no discharge instructions.  He will be having a private paid aide but not starting until Monday 02/02/24. [employer]BC[employer] BSW is checking on possible PCS hours with health insurance.   Background:   Past Medical History:  Diagnosis Date   AAA (abdominal aortic aneurysm) without rupture 02/23/2020   a.) AAA duplex 02/23/2020: 3.8 cm; b.) AAA duplex 11/28/2020: 3.7 cm; c.) CTA AO + BIFEM 12/18/2020: 3.8 x 3.4 cm; d.) AAA duplex 11/07/2021: 3.7 cm; e.) CTA AO + BIFEM 11/16/2021: 4.2 cm; f.) AAA duplex 05/28/2022: 4.0 cm; g.) AAA duplex 11/26/2022: 4.4 cm; h.) PET CT 10/01/2022: 4.3 cm; i.) AAA duplex 08/06/2023: 4.8 cm; j.) CTA abd/pel 11/20/2023: 4.8 x 4.6 cm   Alcoholism (HCC)    Anginal pain    Anxiety    Aortic atherosclerosis    Aortic root dilatation    Arthritis    Bilateral carotid artery disease 06/20/2020   a.) carotid doppler 06/20/2020: 1-39% RICA, 40-59% LICA; b.) carotid doppler 11/28/2020, 11/26/2022: 1-39% BICA   Bilateral inguinal hernia    CAD (coronary artery disease) 06/03/2017   a.) LHC 06/03/2017: 25% pLAD - med mgmt; b.) LHC 08/22/2021: 25% pLAD, 15% o-mLM, 25% dLM-pLAD, 25% o-mLCx, 25% oD2 - med mgmt   Carpal tunnel syndrome    Cigarette smoker    COPD (chronic obstructive pulmonary disease) (HCC)    COVID-19 2022   Deep vein thrombosis (DVT) of left lower extremity (HCC)    Depression    Diastolic dysfunction    Diverticulosis    Dupuytren contracture of both hands    Dyspnea    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    a.)  small area of chronic encephalomalacia in the LEFT inferior temporal gyrus   GERD (gastroesophageal reflux disease)    Hepatic steatosis    HLD (hyperlipidemia)    Hypertension    Incomplete left bundle branch block (LBBB)    Lung nodules    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   a.) PET CT 10/01/2022: 6 mm with SUV max of 6.4; b.) s.p FNA 11/05/2022 --> pathology negative for malignancy (DDx oncocytosis vs oncocytoma)   PAOD (peripheral arterial occlusive disease)    a.) s/p BILATERAL femoral endarterectomies 12/29/2020; b.) s/p aorto-bifemoral bypass   Peripheral edema 03/19/2021   Pre-diabetes    Stenosis of cervical spine with myelopathy (HCC)    SVT (supraventricular tachycardia)    a.) s/p ablation 02/28/2016   Vitamin B12 deficiency    Vitamin D deficiency     Assessment: Patient Reported Symptoms:  Cognitive Cognitive Status: Alert and oriented to person, place, and time, Normal speech and language skills      Neurological Neurological Review of Symptoms: No symptoms reported    HEENT HEENT Symptoms Reported: No symptoms reported      Cardiovascular Cardiovascular Symptoms Reported: No symptoms reported Cardiovascular Comment: Followed by Cardiology/dr. Florencio, next appt 04/07/24.  Discussed importance of taking Eliquis , lisinopril /HCTZ, metoprolol  as prescribed. Encouraged to purchase BP monitor, take BP daily and log.  Respiratory Respiratory Symptoms Reported: No symptoms reported  Additional Respiratory Details: Reports use of Symbicort and Duoneb as ordered, followed by Dr. Payton, next appt is 02/19/24    Endocrine Endocrine Symptoms Reported: Not assessed    Gastrointestinal Gastrointestinal Symptoms Reported: No symptoms reported      Genitourinary Genitourinary Symptoms Reported: Pain/burning with urination Additional Genitourinary Details: Taking antibiotic for UTI, reports pain/burning with urination has improved.    Integumentary  Integumentary Symptoms Reported: Wound Additional Integumentary Details: Left hand has open wound on index finger at joint, right hand has bruising from , elbows has scabbing, no drainage    Musculoskeletal Musculoskelatal Symptoms Reviewed: Difficulty walking, Limited mobility, Weakness Additional Musculoskeletal Details: Reports four falls on 01/29/24, patient believes falls are due to leg weakness, generalized weakness. This RNCM notified PCP office, requesting HHPT/OT/RN/aide. Discussed safety in the home, risks of falls due to taking Eliquis .        Psychosocial       Do you feel physically threatened by others?: No    01/30/2024    PHQ2-9 Depression Screening   Little interest or pleasure in doing things    Feeling down, depressed, or hopeless    PHQ-2 - Total Score    Trouble falling or staying asleep, or sleeping too much    Feeling tired or having little energy    Poor appetite or overeating     Feeling bad about yourself - or that you are a failure or have let yourself or your family down    Trouble concentrating on things, such as reading the newspaper or watching television    Moving or speaking so slowly that other people could have noticed.  Or the opposite - being so fidgety or restless that you have been moving around a lot more than usual    Thoughts that you would be better off dead, or hurting yourself in some way    PHQ2-9 Total Score    If you checked off any problems, how difficult have these problems made it for you to do your work, take care of things at home, or get along with other people    Depression Interventions/Treatment      There were no vitals filed for this visit.  Medications Reviewed Today     Reviewed by Lucian Santana LABOR, RN (Registered Nurse) on 01/30/24 at 1312  Med List Status: <None>   Medication Order Taking? Sig Documenting Provider Last Dose Status Informant  acetaminophen  (TYLENOL ) 500 MG tablet [id number]  Take 2 tablets (1,000 mg  total) by mouth every 6 (six) hours as needed. Ulis Bottcher, PA-C  Active Self           Med Note NIKKI, MARIA   Fri Jan 16, 2024 11:07 AM) prn  APIXABAN  (ELIQUIS ) VTE STARTER PACK (10MG  AND 5MG ) [id number] Yes Take as directed on package: start with two-5mg  tablets twice daily for 7 days. On day 8, switch to one-5mg  tablet twice daily. Laurita Cort DASEN, MD  Active Self  atorvastatin  (LIPITOR) 40 MG tablet [id number]  Take 40 mg by mouth daily.  Patient taking differently: Take 20 mg by mouth every morning.   [provider]  Active Self  budesonide-formoterol  (SYMBICORT) 160-4.5 MCG/ACT inhaler [id number] Yes Inhale 2 puffs into the lungs 2 (two) times daily. [provider]  Active Self  cephALEXin  (KEFLEX ) 500 MG capsule [id number] Yes Take 1 capsule (500 mg total) by mouth 3 (three) times daily. For 7 days Zafirov, Clarissa A, MD  Active   cholecalciferol (VITAMIN D3) 25 MCG (  1000 UNIT) tablet [id number] Yes Take 1,000 Units by mouth daily. [provider]  Active Self  cloNIDine  (CATAPRES ) 0.1 MG tablet [id number] Yes Take 0.1-0.2 mg by mouth See admin instructions. 0.2 mg every morning. If bp elevated later in the day, pt will take 0.1 mg tablet if needed [provider]  Active Self  docusate sodium  (COLACE) 100 MG capsule [id number]  Take 1 capsule (100 mg total) by mouth 2 (two) times daily. Ulis Bottcher, PA-C  Active Self  ipratropium-albuterol  (DUONEB) 0.5-2.5 (3) MG/3ML SOLN [id number] Yes Inhale 3 mLs into the lungs every 6 (six) hours as needed. [provider]  Active Self  lisinopril  (ZESTRIL ) 20 MG tablet [id number]  Take 1 tablet (20 mg total) by mouth daily. Trudy Anthony HERO, MD  Active   metoprolol  succinate (TOPROL -XL) 100 MG 24 hr tablet [id number] Yes Take 100 mg by mouth every morning. Take with or immediately following a meal. [provider]  Active Self  nicotine  polacrilex (COMMIT) 4 MG lozenge [id number]  Take 4 mg by  mouth as needed.  Patient not taking: Reported on 01/30/2024   [provider]  Active Self  ondansetron  (ZOFRAN ) 4 MG tablet [id number]  Take 1 tablet (4 mg total) by mouth every 8 (eight) hours as needed for nausea or vomiting. Ulis Bottcher, PA-C  Active Self           Med Note NIKKI, MARIA   Fri Jan 16, 2024 11:08 AM) prn  Potassium 99 MG TABS [id number] Yes Take 1 tablet by mouth daily at 6 (six) AM. [provider]  Active Self  pregabalin  (LYRICA ) 50 MG capsule [id number]  Take 1 capsule (50 mg total) by mouth 2 (two) times daily. Ulis Bottcher, PA-C  Active Self  senna (SENOKOT) 8.6 MG TABS tablet [id number]  Take 1 tablet (8.6 mg total) by mouth daily. Ulis Bottcher, PA-C  Active Self  tiZANidine  (ZANAFLEX ) 4 MG tablet [id number]  Take 0.5-1 tablets (2-4 mg total) by mouth every 8 (eight) hours as needed. Ulis Bottcher, PA-C  Active Self           Med Note NIKKI, MARIA   Fri Jan 16, 2024 11:03 AM) prn  vitamin B-12 (CYANOCOBALAMIN ) 500 MCG tablet [id number] Yes Take 500 mcg by mouth daily. [provider]  Active Self            Recommendation:   Specialty provider follow-up : Vascular 02/06/24; Neurosurgery 02/16/24; Pulmonary 02/19/24;  DME requests:  bedside commode Home Health requests: Occupational Therapy Physical therapy Skilled Nursing Aide  Follow Up Plan:   Telephone follow-up in 1 week  Santana Stamp BSN, CCM [place]  [facility] Population Health RN Care Manager Direct Dial: [phone]  Fax: [phone]

## 2024-01-30 NOTE — Patient Outreach (Signed)
 Contacted [facility], spoke with nurse, Ellouise,  to report patient had four falls yesterday and discussed the situation where he  was discharged from [facility] on 10/3, sent to [facility] where he left AMA same day, no discharge instructions.  On 01/28/24 he informed PCP on virtual visit he had home health due to come in but this is a private paid individual that would be coming in to assist with bathing/meals/transportation, NOT an actual agency.  This RNCM requesting HH/PT/OT/RN/aide. Provided my contact info.

## 2024-01-30 NOTE — Patient Instructions (Signed)
 Visit Information  Thank you for taking time to visit with me today. Please don't hesitate to contact me if I can be of assistance to you before our next scheduled appointment.  Your next care management appointment is by telephone on Friday, October 17th  at 2:30pm,   Please call the care guide team at [phone] if you need to cancel, schedule, or reschedule an appointment.   Please call the [place]  National Suicide Prevention Lifeline: (787) 488-7927 or TTY: 435-496-9295 TTY [phone]) to talk to a trained counselor if you are experiencing a Mental Health or Behavioral Health Crisis or need someone to talk to.  Santana Stamp BSN, CCM [place]  [facility] Population Health RN Care Manager Direct Dial: [phone]  Fax: [phone]

## 2024-01-30 NOTE — Telephone Encounter (Signed)
  FYI Only or Action Required?: Action required by provider: referral request and update on patient condition.  Patient was last seen in primary care on 01/28/2024 by Zafirov, Clarissa A, MD.  Called Nurse Triage reporting Advice Only.  Symptoms began n/a.  Interventions attempted: Other: n/a.  Symptoms are: unchanged.  Triage Disposition: Call PCP When Office is Open  Patient/caregiver understands and will follow disposition?: Yes Copied from CRM [id number]. Topic: Clinical - Red Word Triage >> Jan 30, 2024  1:31 PM Myrick T wrote: [facility] that prompted transfer to Nurse Triage: Santana [place] Nurse Case Mgr called stated patient left the rehab facility on 10/3 AMA and has fallen 4 times. Patient had provider under the impression he was getting OT/PT and he is not. Santana wants prover to write an order for nurse aid/PT/OT. Reason for Disposition  Caller requesting routine or non-urgent lab result    Request for orders  Answer Assessment - Initial Assessment Questions 1. REASON FOR CALL or QUESTION: What is your reason for calling today? or How can I best     Patient is not getting home health.  Patient needs orders for Home PT, OT, and a nursing Patient also needs bedside commode and shower chair with handles 2. CALLER: Document the source of call. (e.g., laboratory staff, caregiver or patient).     Santana Stamp, RN  [facility]  Can be reached  at [phone]  Protocols used: PCP Call - No Triage-A-AH

## 2024-01-30 NOTE — Patient Outreach (Signed)
 Attempted to reach [facility] at [phone] to inquire if patient left facility AMA or if discharge orders were given for HHPT/OT.  Left VM with SW department for return call to this RNCM.

## 2024-02-04 NOTE — Telephone Encounter (Signed)
 Pt is still having a issue with their right hand, and unable to use to eat and dropping items. Wondering if he is still going to have this surgery scheduled?

## 2024-02-04 NOTE — Telephone Encounter (Addendum)
 Per message on 01/08/24: we will discuss timing of rescheduling his carpal tunnel surgery at his post op appt. He is scheduled to see Dr Claudene on 02/16/24.    However, the carpal tunnel surgery was previously scheduled/canceled for his LEFT hand (canceled day of due to a scrape on his hand) and this message is complaining that he is still having trouble with his RIGHT hand.SABRASABRA

## 2024-02-04 NOTE — Telephone Encounter (Signed)
 Called and spoke with the patient. Relayed Brooke's advice on wearing a wrist brace, which he plans to pick up from [facility]. Also advised him to utilize ice and to elevate the extremity above heart level. Reinforced Kendelyn's suggestion of resuming tylenol  1000mg  every 8 hours. Patient agreeable at this time.

## 2024-02-04 NOTE — Telephone Encounter (Addendum)
 I spoke with the patient and informed him that we can discuss his carpal tunnel surgery at his next appointment.   He reports that his right hand is throbbing and he is more concerned about his right hand at this time.  He is taking:  lyrica  50mg  twice per day Tizanidine  4mg  twice per day He has not been taking the tylenol . I encouraged him to resume this.  He would like to know what he can do about his right hand throbbing. This is his dominant hand.  His PCP placed a referral to PT on 01/30/24. I have provided him w/ the phone # to schedule this.   [facility] [place]

## 2024-02-06 ENCOUNTER — Other Ambulatory Visit (INDEPENDENT_AMBULATORY_CARE_PROVIDER_SITE_OTHER)

## 2024-02-06 ENCOUNTER — Other Ambulatory Visit: Payer: Self-pay

## 2024-02-06 ENCOUNTER — Ambulatory Visit (INDEPENDENT_AMBULATORY_CARE_PROVIDER_SITE_OTHER): Admitting: Vascular Surgery

## 2024-02-06 ENCOUNTER — Encounter (INDEPENDENT_AMBULATORY_CARE_PROVIDER_SITE_OTHER)

## 2024-02-06 NOTE — Patient Outreach (Signed)
 Complex Care Management   Visit Note  02/06/2024  Name:  Scott [name] MRN: [id number] DOB: Oct 11, 1960  Situation: Referral received for Complex Care Management related to Impaired Mobility/falls/needs help with mobility. I obtained verbal consent from Patient.  Visit completed with Scott [name]  on the phone.  Scott [name] was in the hospital 01/16/24 due to weakness post surgery for Cervical Decompression/Discectomy Fusion on 01/08/24. He was discharged to SNF on 01/23/24 but upon arrival, there were multiple issues that lead him to make the decision to go home (broken bathroom, people with COVID in the building, unclean environment, facility had a rating of 85.7) States he will be having a private paid caregiver who can come in to help with ADL's. He is not aware of any home health for PT/OT orders. He had a telehealth visit with Dr. Zafirov 01/28/24,  she ordered outpatient PT at [facility] per patent.   Background:   Past Medical History:  Diagnosis Date   AAA (abdominal aortic aneurysm) without rupture 02/23/2020   a.) AAA duplex 02/23/2020: 3.8 cm; b.) AAA duplex 11/28/2020: 3.7 cm; c.) CTA AO + BIFEM 12/18/2020: 3.8 x 3.4 cm; d.) AAA duplex 11/07/2021: 3.7 cm; e.) CTA AO + BIFEM 11/16/2021: 4.2 cm; f.) AAA duplex 05/28/2022: 4.0 cm; g.) AAA duplex 11/26/2022: 4.4 cm; h.) PET CT 10/01/2022: 4.3 cm; i.) AAA duplex 08/06/2023: 4.8 cm; j.) CTA abd/pel 11/20/2023: 4.8 x 4.6 cm   Alcoholism (HCC)    Anginal pain    Anxiety    Aortic atherosclerosis    Aortic root dilatation    Arthritis    Bilateral carotid artery disease 06/20/2020   a.) carotid doppler 06/20/2020: 1-39% RICA, 40-59% LICA; b.) carotid doppler 11/28/2020, 11/26/2022: 1-39% BICA   Bilateral inguinal hernia    CAD (coronary artery disease) 06/03/2017   a.) LHC 06/03/2017: 25% pLAD - med mgmt; b.) LHC 08/22/2021: 25% pLAD, 15% o-mLM, 25% dLM-pLAD, 25% o-mLCx, 25% oD2 - med mgmt   Carpal tunnel syndrome    Cigarette smoker    COPD  (chronic obstructive pulmonary disease) (HCC)    COVID-19 2022   Deep vein thrombosis (DVT) of left lower extremity (HCC)    Depression    Diastolic dysfunction    Diverticulosis    Dupuytren contracture of both hands    Dyspnea    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    a.) small area of chronic encephalomalacia in the LEFT inferior temporal gyrus   GERD (gastroesophageal reflux disease)    Hepatic steatosis    HLD (hyperlipidemia)    Hypertension    Incomplete left bundle branch block (LBBB)    Lung nodules    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   a.) PET CT 10/01/2022: 6 mm with SUV max of 6.4; b.) s.p FNA 11/05/2022 --> pathology negative for malignancy (DDx oncocytosis vs oncocytoma)   PAOD (peripheral arterial occlusive disease)    a.) s/p BILATERAL femoral endarterectomies 12/29/2020; b.) s/p aorto-bifemoral bypass   Peripheral edema 03/19/2021   Pre-diabetes    Stenosis of cervical spine with myelopathy (HCC)    SVT (supraventricular tachycardia)    a.) s/p ablation 02/28/2016   Vitamin B12 deficiency    Vitamin D deficiency     Assessment: Patient Reported Symptoms:  Cognitive Cognitive Status: Alert and oriented to person, place, and time, Normal speech and language skills      Neurological Neurological Review of Symptoms: No symptoms reported    HEENT HEENT Symptoms Reported: Not assessed  Cardiovascular Cardiovascular Symptoms Reported: Not assessed    Respiratory Respiratory Symptoms Reported: No symptoms reported    Endocrine Endocrine Symptoms Reported: Not assessed    Gastrointestinal Gastrointestinal Symptoms Reported: Constipation Additional Gastrointestinal Details: Will take ducolax for constipation, educated pateint on not going more than 3 days without BM before getting more aggressive with taking ducolax.      Genitourinary Genitourinary Symptoms Reported: No symptoms reported    Integumentary Integumentary Symptoms Reported:  Wound Additional Integumentary Details: Left hand has open wound on index finger at joint, right hand has bruising from , elbows has scabbing, no drainage. Educated patient on cleaning wound once a day, smear a thin film of vasolline and cover with clean bandage if needed, taking care to change/replace damp/moist bandage. Skin Self-Management Outcome: 3 (uncertain)  Musculoskeletal Musculoskelatal Symptoms Reviewed: Difficulty walking, Limited mobility Additional Musculoskeletal Details: Reports ambulation is improving, denies falls since 01/29/24.  States he will have a virtual visit for evaluation with [facility].  This RNCM attempted to reach the PT clinic to learn of plan, left voicemail for return call.        Psychosocial Psychosocial Symptoms Reported: Not assessed          02/06/2024    PHQ2-9 Depression Screening   Little interest or pleasure in doing things    Feeling down, depressed, or hopeless    PHQ-2 - Total Score    Trouble falling or staying asleep, or sleeping too much    Feeling tired or having little energy    Poor appetite or overeating     Feeling bad about yourself - or that you are a failure or have let yourself or your family down    Trouble concentrating on things, such as reading the newspaper or watching television    Moving or speaking so slowly that other people could have noticed.  Or the opposite - being so fidgety or restless that you have been moving around a lot more than usual    Thoughts that you would be better off dead, or hurting yourself in some way    PHQ2-9 Total Score    If you checked off any problems, how difficult have these problems made it for you to do your work, take care of things at home, or get along with other people    Depression Interventions/Treatment      There were no vitals filed for this visit.  Medications Reviewed Today   Medications were not reviewed in this encounter     Recommendation:   Specialty provider  follow-up Surgeon 02/16/24  Continue Current Plan of Care Patient will attend virtual phone visit with [facility] in the next week (this RNCM called and left voicemail with PT clinic at [phone] to inquire of upcoming appt/plan so I can help patient keep appointments organized).     Follow Up Plan:   Telephone follow-up in 1 week  Santana Stamp BSN, CCM [place]  [facility] Population Health RN Care Manager Direct Dial: [phone]  Fax: [phone]

## 2024-02-06 NOTE — Patient Instructions (Signed)
 Visit Information  Thank you for taking time to visit with me today. Please don't hesitate to contact me if I can be of assistance to you before our next scheduled appointment.  Your next care management appointment is by telephone on Thursday, October 23rd at 1:30pm.  Please call the care guide team at [phone] if you need to cancel, schedule, or reschedule an appointment.   A reminder to ALL patients/family/friends, please call the [place]  National Suicide Prevention Lifeline: 204 565 4199 or TTY: 479-147-2271 TTY [phone]) to talk to a trained counselor if you are experiencing a Mental Health or Behavioral Health Crisis or need someone to talk to.  Santana Stamp BSN, CCM [place]  [facility] Population Health RN Care Manager Direct Dial: [phone]  Fax: [phone]

## 2024-02-09 ENCOUNTER — Telehealth (INDEPENDENT_AMBULATORY_CARE_PROVIDER_SITE_OTHER): Payer: Self-pay

## 2024-02-09 DIAGNOSIS — I1A0 Resistant hypertension: Secondary | ICD-10-CM

## 2024-02-09 DIAGNOSIS — R531 Weakness: Secondary | ICD-10-CM

## 2024-02-09 DIAGNOSIS — W19XXXA Unspecified fall, initial encounter: Secondary | ICD-10-CM

## 2024-02-09 DIAGNOSIS — M792 Neuralgia and neuritis, unspecified: Secondary | ICD-10-CM

## 2024-02-09 DIAGNOSIS — R300 Dysuria: Secondary | ICD-10-CM

## 2024-02-09 DIAGNOSIS — M79605 Pain in left leg: Secondary | ICD-10-CM

## 2024-02-09 DIAGNOSIS — G8929 Other chronic pain: Secondary | ICD-10-CM

## 2024-02-09 DIAGNOSIS — F1010 Alcohol abuse, uncomplicated: Secondary | ICD-10-CM

## 2024-02-09 NOTE — Progress Notes (Signed)
 Progress Note  Physician: Landrum Carbonell A Jamahl Lemmons, MD   Patient contacted 02/09/24 at 10:40 AM EDT by a video enabled telemedicine application and verified that I am speaking with the correct person.   Patient is aware of limitations of evaluation by telemedicine The patient expressed understanding and agreed to proceed.   HPI: Scott [name] is a 63 y.o. male presenting on 02/09/2024 for No chief complaint on file. Scott [name] is a 63 year old male with peripheral vascular disease.  Mobility improving slightly after hospitalization at the beginning of the month  S/p spinal surgery  - Recent hospitalization s/p spinal surgery - Slow wound healing - Smoking one pack per day, contributing to delayed healing - Did not transition to skilled nursing facility after discharge - Currently taking Eliquis  in setting of afib  - F/u with neurosurgery next week  - Recent course of cephalexin  after last provider encounter due to UTI.  Symptoms resolved.   Mobility impairment - Ongoing mobility issues following recent hospitalization.  He is only walking across the room.  Significant weakness  Neuropathic pain and muscle spasticity - Currently taking pregabalin  and tizanidine  which causes dry mouth  Blood pressure monitoring - Blood pressure stable with home monitoring.  130's systolic  Nutritional status and weight concerns - Monitoring weight due to concern for potential weight loss and impact on muscle mass.  Weight 150 currently.  - Eating inadequately to maintain weight  Substance use - Smokes one pack per day, working on reducing tobacco use - Drinking 4 beers daily    Social history:  Relevant past medical, surgical, family and social history reviewed and updated as indicated. Interim medical history since our last visit reviewed.  Allergies and medications reviewed and updated.  DATA REVIEWED: CHART IN EPIC  ROS: Negative unless specifically indicated above  in HPI.    Current Outpatient Medications:    acetaminophen  (TYLENOL ) 500 MG tablet, Take 2 tablets (1,000 mg total) by mouth every 6 (six) hours as needed., Disp: 30 tablet, Rfl: 0   APIXABAN  (ELIQUIS ) VTE STARTER PACK (10MG  AND 5MG ), Take as directed on package: start with two-5mg  tablets twice daily for 7 days. On day 8, switch to one-5mg  tablet twice daily., Disp: 74 each, Rfl: 0   atorvastatin  (LIPITOR) 40 MG tablet, Take 40 mg by mouth daily., Disp: , Rfl:    budesonide-formoterol  (SYMBICORT) 160-4.5 MCG/ACT inhaler, Inhale 2 puffs into the lungs 2 (two) times daily., Disp: , Rfl:    cholecalciferol (VITAMIN D3) 25 MCG (1000 UNIT) tablet, Take 1,000 Units by mouth daily., Disp: , Rfl:    cloNIDine  (CATAPRES ) 0.1 MG tablet, Take 0.1-0.2 mg by mouth See admin instructions. 0.2 mg every morning. If bp elevated later in the day, pt will take 0.1 mg tablet if needed, Disp: , Rfl:    docusate sodium  (COLACE) 100 MG capsule, Take 1 capsule (100 mg total) by mouth 2 (two) times daily. (Patient taking differently: Take 100 mg by mouth 2 (two) times daily. As needed for constipation), Disp: 10 capsule, Rfl: 0   ipratropium-albuterol  (DUONEB) 0.5-2.5 (3) MG/3ML SOLN, Inhale 3 mLs into the lungs every 6 (six) hours as needed., Disp: , Rfl:    lisinopril  (ZESTRIL ) 20 MG tablet, Take 1 tablet (20 mg total) by mouth daily. (Patient not taking: Reported on 01/30/2024), Disp: 30 tablet, Rfl: 0   lisinopril -hydrochlorothiazide  (ZESTORETIC ) 20-12.5 MG tablet, Take 1 tablet by mouth daily., Disp: ,  Rfl:    metoprolol  succinate (TOPROL -XL) 100 MG 24 hr tablet, Take 100 mg by mouth every morning. Take with or immediately following a meal., Disp: , Rfl:    nicotine  polacrilex (COMMIT) 4 MG lozenge, Take 4 mg by mouth as needed. (Patient not taking: Reported on 01/30/2024), Disp: , Rfl:    ondansetron  (ZOFRAN ) 4 MG tablet, Take 1 tablet (4 mg total) by mouth every 8 (eight) hours as needed for nausea or vomiting.,  Disp: 20 tablet, Rfl: 0   Potassium 99 MG TABS, Take 1 tablet by mouth daily at 6 (six) AM., Disp: , Rfl:    pregabalin  (LYRICA ) 50 MG capsule, Take 1 capsule (50 mg total) by mouth 2 (two) times daily. (Patient not taking: Reported on 01/30/2024), Disp: 60 capsule, Rfl: 1   senna (SENOKOT) 8.6 MG TABS tablet, Take 1 tablet (8.6 mg total) by mouth daily. (Patient taking differently: Take 1 tablet by mouth daily as needed for mild constipation.), Disp: 120 tablet, Rfl: 0   tiZANidine  (ZANAFLEX ) 4 MG tablet, Take 0.5-1 tablets (2-4 mg total) by mouth every 8 (eight) hours as needed., Disp: 90 tablet, Rfl: 1   vitamin B-12 (CYANOCOBALAMIN ) 500 MCG tablet, Take 500 mcg by mouth daily., Disp: , Rfl:       Objective:  Telephone visit     Assessment & Plan:  Fall at home, initial encounter -     Ambulatory referral to Home Health  Chronic lower extremity pain (2ry area of Pain) (Left) -     Ambulatory referral to Home Health  Dysuria  Generalized weakness  Resistant hypertension  Alcohol  abuse  Chronic neuropathic pain   Impaired mobility Impaired mobility due to recent hospitalization and inactivity. - Refer to home health for physical and occupational therapy. - Encourage structured exercises for strength and endurance.  Chronic pain Chronic pain managed with pregabalin  and tizanidine . - Advise gradual reduction of pregabalin  dosage. - Continue tizanidine  as prescribed.  Peripheral vascular disease Peripheral vascular disease affecting wound healing. - Encouraged smoking cessation.  - Should continue atorvastatin  - will review next visit  Hypertension - BP stable.  Continue metoprolol  and lisinopril -hydrochlorothiazide  20/12.5  Alcohol  abuse Discussed ETOH cessation where possible.   No follow-ups on file.

## 2024-02-10 ENCOUNTER — Other Ambulatory Visit: Payer: Self-pay | Admitting: Physician Assistant

## 2024-02-11 ENCOUNTER — Other Ambulatory Visit: Payer: Self-pay | Admitting: Family Medicine

## 2024-02-11 DIAGNOSIS — Z049 Encounter for examination and observation for unspecified reason: Secondary | ICD-10-CM

## 2024-02-12 ENCOUNTER — Other Ambulatory Visit: Payer: Self-pay

## 2024-02-12 NOTE — Patient Instructions (Signed)
 Visit Information  Thank you for taking time to visit with me today. Please don't hesitate to contact me if I can be of assistance to you before our next scheduled appointment.  Your next care management appointment is by telephone on Thursday, November 6th at 3:00pm  Please call the care guide team at [phone] if you need to cancel, schedule, or reschedule an appointment.   A reminder to ALL patients/family/friends, please call the [place]  National Suicide Prevention Lifeline: 979-131-1756 or TTY: 386-492-9003 TTY 318-390-9274) to talk to a trained counselor if you are experiencing a Mental Health or Behavioral Health Crisis or need someone to talk to.  Santana Stamp BSN, CCM [place]  [employer] Population Health RN Care Manager Direct Dial: [phone]  Fax: [phone]

## 2024-02-12 NOTE — Patient Outreach (Signed)
 Complex Care Management   Visit Note  02/12/2024  Name:  Scott [name] MRN: [id number] DOB: Aug 26, 1960  Situation: Referral received for Complex Care Management related to SDOH Barriers:  PCS and Impaired Mobility. I obtained verbal consent from Patient.  Visit completed with Scott [name]  on the phone, Scott [name] focused this call on limited use of his left hand, he has upcoming appointment to discuss carpel tunnel surgery.  He is waiting for call from home health agency to start PT/OT as ordered by PCP on 02/09/24 televisit.    Background:   Past Medical History:  Diagnosis Date   AAA (abdominal aortic aneurysm) without rupture 02/23/2020   a.) AAA duplex 02/23/2020: 3.8 cm; b.) AAA duplex 11/28/2020: 3.7 cm; c.) CTA AO + BIFEM 12/18/2020: 3.8 x 3.4 cm; d.) AAA duplex 11/07/2021: 3.7 cm; e.) CTA AO + BIFEM 11/16/2021: 4.2 cm; f.) AAA duplex 05/28/2022: 4.0 cm; g.) AAA duplex 11/26/2022: 4.4 cm; h.) PET CT 10/01/2022: 4.3 cm; i.) AAA duplex 08/06/2023: 4.8 cm; j.) CTA abd/pel 11/20/2023: 4.8 x 4.6 cm   Alcoholism (HCC)    Anginal pain    Anxiety    Aortic atherosclerosis    Aortic root dilatation    Arthritis    Bilateral carotid artery disease 06/20/2020   a.) carotid doppler 06/20/2020: 1-39% RICA, 40-59% LICA; b.) carotid doppler 11/28/2020, 11/26/2022: 1-39% BICA   Bilateral inguinal hernia    CAD (coronary artery disease) 06/03/2017   a.) LHC 06/03/2017: 25% pLAD - med mgmt; b.) LHC 08/22/2021: 25% pLAD, 15% o-mLM, 25% dLM-pLAD, 25% o-mLCx, 25% oD2 - med mgmt   Carpal tunnel syndrome    Cigarette smoker    COPD (chronic obstructive pulmonary disease) (HCC)    COVID-19 2022   Deep vein thrombosis (DVT) of left lower extremity (HCC)    Depression    Diastolic dysfunction    Diverticulosis    Dupuytren contracture of both hands    Dyspnea    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    a.) small area of chronic encephalomalacia in the LEFT inferior temporal gyrus   GERD  (gastroesophageal reflux disease)    Hepatic steatosis    HLD (hyperlipidemia)    Hypertension    Incomplete left bundle branch block (LBBB)    Intermittent chest pain 03/28/2017   Last Assessment & Plan:   Formatting of this note might be different from the original.  Stable. 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   a.) PET CT 10/01/2022: 6 mm with SUV max of 6.4; b.) s.p FNA 11/05/2022 --> pathology negative for malignancy (DDx oncocytosis vs oncocytoma)   PAOD (peripheral arterial occlusive disease)    a.) s/p BILATERAL femoral endarterectomies 12/29/2020; b.) s/p aorto-bifemoral bypass   Peripheral edema 03/19/2021   Pre-diabetes    Stenosis of cervical spine with myelopathy (HCC)    SVT (supraventricular tachycardia)    a.) s/p ablation 02/28/2016   Vitamin B12 deficiency    Vitamin D deficiency     Assessment: Patient Reported Symptoms:  Cognitive Cognitive Status: Alert and oriented to person, place, and time, Normal speech and language skills      Neurological Neurological Review of Symptoms: No symptoms reported    HEENT HEENT Symptoms Reported: Not assessed      Cardiovascular Cardiovascular Symptoms Reported: Not assessed    Respiratory Respiratory Symptoms Reported: Not assesed    Endocrine Endocrine Symptoms Reported: Not assessed Is patient  diabetic?: No    Gastrointestinal Gastrointestinal Symptoms Reported: Diarrhea Additional Gastrointestinal Details: Reports he believes Pregabalin  is causing diarrhea, per notes from PCP televisit 02/09/24, plan states to reduce dose on Pregabalin  if possible.  This RNCM reminded patient of PCP plan.      Genitourinary Genitourinary Symptoms Reported: No symptoms reported    Integumentary Integumentary Symptoms Reported: Not assessed    Musculoskeletal Musculoskelatal Symptoms Reviewed: Limited mobility, Difficulty walking, Other Additional Musculoskeletal Details: Complains of  difficulty using his left hand, awaiting carpel tunnel surgery, scheduled to see hand surgeon 02/16/24.  Denies falls since 01/29/24. PCP re-ordered home health on 02/09/24        Psychosocial Psychosocial Symptoms Reported: Not assessed          02/12/2024    PHQ2-9 Depression Screening   Little interest or pleasure in doing things    Feeling down, depressed, or hopeless    PHQ-2 - Total Score    Trouble falling or staying asleep, or sleeping too much    Feeling tired or having little energy    Poor appetite or overeating     Feeling bad about yourself - or that you are a failure or have let yourself or your family down    Trouble concentrating on things, such as reading the newspaper or watching television    Moving or speaking so slowly that other people could have noticed.  Or the opposite - being so fidgety or restless that you have been moving around a lot more than usual    Thoughts that you would be better off dead, or hurting yourself in some way    PHQ2-9 Total Score    If you checked off any problems, how difficult have these problems made it for you to do your work, take care of things at home, or get along with other people    Depression Interventions/Treatment      There were no vitals filed for this visit.  Medications Reviewed Today     Reviewed by Lucian Santana LABOR, RN (Registered Nurse) on 02/12/24 at 1649  Med List Status: <None>   Medication Order Taking? Sig Documenting Provider Last Dose Status Informant  acetaminophen  (TYLENOL ) 500 MG tablet [id number]  Take 2 tablets (1,000 mg total) by mouth every 6 (six) hours as needed. Ulis Bottcher, PA-C  Active Self           Med Note NIKKI, MARIA   Fri Jan 16, 2024 11:07 AM) prn  APIXABAN  (ELIQUIS ) VTE STARTER PACK (10MG  AND 5MG ) [id number]  Take as directed on package: start with two-5mg  tablets twice daily for 7 days. On day 8, switch to one-5mg  tablet twice daily. Laurita Cort DASEN, MD  Active Self  atorvastatin   (LIPITOR) 40 MG tablet [id number]  Take 40 mg by mouth daily. [provider]  Active Self  budesonide-formoterol  (SYMBICORT) 160-4.5 MCG/ACT inhaler [id number]  Inhale 2 puffs into the lungs 2 (two) times daily. [provider]  Active Self  cholecalciferol (VITAMIN D3) 25 MCG (1000 UNIT) tablet [id number]  Take 1,000 Units by mouth daily. [provider]  Active Self  cloNIDine  (CATAPRES ) 0.1 MG tablet [id number]  Take 0.1-0.2 mg by mouth See admin instructions. 0.2 mg every morning. If bp elevated later in the day, pt will take 0.1 mg tablet if needed [provider]  Active Self  docusate sodium  (COLACE) 100 MG capsule [id number]  Take 1 capsule (100 mg total) by mouth 2 (two) times daily.  Patient taking differently:  Take 100 mg by mouth 2 (two) times daily. As needed for constipation   Ulis Bottcher, PA-C  Active Self  ipratropium-albuterol  (DUONEB) 0.5-2.5 (3) MG/3ML SOLN [id number]  Inhale 3 mLs into the lungs every 6 (six) hours as needed. [provider]  Expired 01/30/24 2359 Self  lisinopril -hydrochlorothiazide  (ZESTORETIC ) 20-12.5 MG tablet [id number]  Take 1 tablet by mouth daily. [provider]  Active   metoprolol  succinate (TOPROL -XL) 100 MG 24 hr tablet [id number]  Take 100 mg by mouth every morning. Take with or immediately following a meal. [provider]  Active Self  ondansetron  (ZOFRAN ) 4 MG tablet [id number]  Take 1 tablet (4 mg total) by mouth every 8 (eight) hours as needed for nausea or vomiting. Ulis Bottcher, PA-C  Active Self           Med Note NIKKI, MARIA   Fri Jan 16, 2024 11:08 AM) prn  Potassium 99 MG TABS [id number]  Take 1 tablet by mouth daily at 6 (six) AM. [provider]  Active Self  pregabalin  (LYRICA ) 50 MG capsule [id number]  Take 1 capsule (50 mg total) by mouth 2 (two) times daily.  Patient not taking: Reported on 01/30/2024   Frisbie, Brooke, PA-C  Active Self  senna (SENOKOT) 8.6 MG  TABS tablet [id number]  Take 1 tablet (8.6 mg total) by mouth daily.  Patient taking differently: Take 1 tablet by mouth daily as needed for mild constipation.   Ulis Bottcher, PA-C  Active Self  tiZANidine  (ZANAFLEX ) 4 MG tablet [id number]  TAKE 1/2 TO 1 TABLET (2-4 MG TOTAL) BY MOUTH EVERY 8 HOURS AS NEEDED Ulis Bottcher, PA-C  Active   vitamin B-12 (CYANOCOBALAMIN ) 500 MCG tablet [id number]  Take 500 mcg by mouth daily. [provider]  Active Self            Recommendation:   Discussed upcoming appts:  Specialty provider follow-up : Neurosurgery 02/16/24; Pulmonary 02/19/24 Educated patient on PCP plan notes from 02/09/24, to reduce pregabalin  if possible due to loose stools, to take care when ambulating to avoid falls.    Follow Up Plan:   Telephone follow-up two weeks  Santana Stamp BSN, CCM [place]  [facility] Population Health RN Care Manager Direct Dial: [phone]  Fax: [phone]

## 2024-02-13 ENCOUNTER — Other Ambulatory Visit: Payer: Self-pay

## 2024-02-13 DIAGNOSIS — R531 Weakness: Secondary | ICD-10-CM

## 2024-02-13 NOTE — Telephone Encounter (Signed)
 Done

## 2024-02-13 NOTE — Telephone Encounter (Signed)
 Error

## 2024-02-14 ENCOUNTER — Emergency Department

## 2024-02-14 ENCOUNTER — Emergency Department
Admission: EM | Admit: 2024-02-14 | Discharge: 2024-02-15 | Disposition: A | Attending: Emergency Medicine | Admitting: Emergency Medicine

## 2024-02-14 ENCOUNTER — Other Ambulatory Visit: Payer: Self-pay

## 2024-02-14 DIAGNOSIS — M7918 Myalgia, other site: Secondary | ICD-10-CM | POA: Insufficient documentation

## 2024-02-14 DIAGNOSIS — M19072 Primary osteoarthritis, left ankle and foot: Secondary | ICD-10-CM | POA: Diagnosis not present

## 2024-02-14 DIAGNOSIS — M2012 Hallux valgus (acquired), left foot: Secondary | ICD-10-CM | POA: Diagnosis not present

## 2024-02-14 DIAGNOSIS — M7989 Other specified soft tissue disorders: Secondary | ICD-10-CM | POA: Diagnosis not present

## 2024-02-14 DIAGNOSIS — W19XXXA Unspecified fall, initial encounter: Secondary | ICD-10-CM | POA: Diagnosis not present

## 2024-02-14 DIAGNOSIS — W01198A Fall on same level from slipping, tripping and stumbling with subsequent striking against other object, initial encounter: Secondary | ICD-10-CM | POA: Insufficient documentation

## 2024-02-14 DIAGNOSIS — I10 Essential (primary) hypertension: Secondary | ICD-10-CM | POA: Diagnosis not present

## 2024-02-14 DIAGNOSIS — R1084 Generalized abdominal pain: Secondary | ICD-10-CM | POA: Diagnosis not present

## 2024-02-14 DIAGNOSIS — F1029 Alcohol dependence with unspecified alcohol-induced disorder: Secondary | ICD-10-CM | POA: Insufficient documentation

## 2024-02-14 DIAGNOSIS — S2241XA Multiple fractures of ribs, right side, initial encounter for closed fracture: Secondary | ICD-10-CM | POA: Diagnosis not present

## 2024-02-14 DIAGNOSIS — I2510 Atherosclerotic heart disease of native coronary artery without angina pectoris: Secondary | ICD-10-CM | POA: Diagnosis not present

## 2024-02-14 DIAGNOSIS — M542 Cervicalgia: Secondary | ICD-10-CM | POA: Insufficient documentation

## 2024-02-14 DIAGNOSIS — M79605 Pain in left leg: Secondary | ICD-10-CM | POA: Diagnosis not present

## 2024-02-14 DIAGNOSIS — R052 Subacute cough: Secondary | ICD-10-CM | POA: Insufficient documentation

## 2024-02-14 DIAGNOSIS — R519 Headache, unspecified: Secondary | ICD-10-CM | POA: Insufficient documentation

## 2024-02-14 DIAGNOSIS — R059 Cough, unspecified: Secondary | ICD-10-CM | POA: Diagnosis not present

## 2024-02-14 DIAGNOSIS — Z043 Encounter for examination and observation following other accident: Secondary | ICD-10-CM | POA: Diagnosis not present

## 2024-02-14 DIAGNOSIS — M791 Myalgia, unspecified site: Secondary | ICD-10-CM | POA: Diagnosis not present

## 2024-02-14 DIAGNOSIS — R52 Pain, unspecified: Secondary | ICD-10-CM

## 2024-02-14 DIAGNOSIS — F10929 Alcohol use, unspecified with intoxication, unspecified: Secondary | ICD-10-CM | POA: Diagnosis not present

## 2024-02-14 LAB — CBC WITH DIFFERENTIAL/PLATELET
Abs Immature Granulocytes: 0.02 K/uL (ref 0.00–0.07)
Basophils Absolute: 0.1 K/uL (ref 0.0–0.1)
Basophils Relative: 1 %
Eosinophils Absolute: 0.3 K/uL (ref 0.0–0.5)
Eosinophils Relative: 4 %
HCT: 39.6 % (ref 39.0–52.0)
Hemoglobin: 14.1 g/dL (ref 13.0–17.0)
Immature Granulocytes: 0 %
Lymphocytes Relative: 15 %
Lymphs Abs: 1.2 K/uL (ref 0.7–4.0)
MCH: 33.5 pg (ref 26.0–34.0)
MCHC: 35.6 g/dL (ref 30.0–36.0)
MCV: 94.1 fL (ref 80.0–100.0)
Monocytes Absolute: 0.6 K/uL (ref 0.1–1.0)
Monocytes Relative: 7 %
Neutro Abs: 5.7 K/uL (ref 1.7–7.7)
Neutrophils Relative %: 73 %
Platelets: 221 K/uL (ref 150–400)
RBC: 4.21 MIL/uL — ABNORMAL LOW (ref 4.22–5.81)
RDW: 13.1 % (ref 11.5–15.5)
WBC: 7.9 K/uL (ref 4.0–10.5)
nRBC: 0 % (ref 0.0–0.2)

## 2024-02-14 LAB — BASIC METABOLIC PANEL WITH GFR
Anion gap: 12 (ref 5–15)
BUN: <5 mg/dL — ABNORMAL LOW (ref 8–23)
CO2: 24 mmol/L (ref 22–32)
Calcium: 8.9 mg/dL (ref 8.9–10.3)
Chloride: 96 mmol/L — ABNORMAL LOW (ref 98–111)
Creatinine, Ser: 0.6 mg/dL — ABNORMAL LOW (ref 0.61–1.24)
GFR, Estimated: >60 mL/min (ref >60)
Glucose, Bld: 104 mg/dL — ABNORMAL HIGH (ref 70–99)
Potassium: 3.3 mmol/L — ABNORMAL LOW (ref 3.5–5.1)
Sodium: 132 mmol/L — ABNORMAL LOW (ref 135–145)

## 2024-02-14 LAB — RESP PANEL BY RT-PCR (RSV, FLU A&B, COVID)  RVPGX2
Influenza A by PCR: NEGATIVE
Influenza B by PCR: NEGATIVE
Resp Syncytial Virus by PCR: NEGATIVE
SARS Coronavirus 2 by RT PCR: NEGATIVE

## 2024-02-14 MED ORDER — IPRATROPIUM-ALBUTEROL 0.5-2.5 (3) MG/3ML IN SOLN
3.0000 mL | Freq: Once | RESPIRATORY_TRACT | Status: AC
  Administered 2024-02-14: 3 mL via RESPIRATORY_TRACT

## 2024-02-14 MED ORDER — PHENOBARBITAL SODIUM 130 MG/ML IJ SOLN
130.0000 mg | Freq: Once | INTRAMUSCULAR | Status: AC
  Administered 2024-02-14: 130 mg via INTRAVENOUS
  Filled 2024-02-14: qty 1

## 2024-02-14 MED ORDER — IPRATROPIUM-ALBUTEROL 0.5-2.5 (3) MG/3ML IN SOLN
3.0000 mL | Freq: Once | RESPIRATORY_TRACT | Status: DC
  Filled 2024-02-14: qty 3

## 2024-02-14 NOTE — ED Notes (Signed)
 Ultrasound of LLE in process

## 2024-02-14 NOTE — ED Triage Notes (Addendum)
 Patient BIB [facility] from home with complaint with generalized body aches & cough x 3 days.   Patient fell against kitchen counter 1 hour ago striking the center of back, patient reports that he did not fall.   EMS reports ETOH of 4 beers today. Last one 1 hour ago.   +Eliquis 

## 2024-02-14 NOTE — ED Provider Notes (Signed)
 [facility] Provider Note    Event Date/Time   First MD Initiated Contact with Patient 02/14/24 2134     (approximate)   History   No chief complaint on file.   HPI  Scott [name] is a 63 y.o. male  ale with medical history significant for chronic foot pain, arthritis, COPD, DVT/PE on Eliquis , CAD, neck pain s/p recent ACDF on 01/08/2024 for cervical spinal stenosis at C3-C4 level who presents from home today by EMS with 3 days of generalized bodyaches and a fall from standing 1 hour after tripping over his foot.  Patient does state that he may have hyperextended his neck after hitting his head on the kitchen counter but denies any loss of consciousness.  He does admit to drinking 5 light beers this evening.  He does report a cough for the past 48 hours.  He reports continued pain in his left foot and has noticed some increased swelling in his left lower extremity.  He denies any SI HI or AVHs.  He states that his weakness in his left upper extremity and contracture is baseline for him     Physical Exam   Triage Vital Signs: ED Triage Vitals  Encounter Vitals Group     BP 02/14/24 2022 (!) 150/102     Girls Systolic BP Percentile --      Girls Diastolic BP Percentile --      Boys Systolic BP Percentile --      Boys Diastolic BP Percentile --      Pulse Rate 02/14/24 2022 92     Resp 02/14/24 2022 18     Temp 02/14/24 2022 98.3 F (36.8 C)     Temp Source 02/14/24 2022 Oral     SpO2 02/14/24 2019 97 %     Weight 02/14/24 2022 150 lb (68 kg)     Height 02/14/24 2022 5' 11 (1.803 m)     Head Circumference --      Peak Flow --      Pain Score --      Pain Loc --      Pain Education --      Exclude from Growth Chart --     Most recent vital signs: Vitals:   02/14/24 2019 02/14/24 2022  BP:  (!) 150/102  Pulse:  92  Resp:  18  Temp:  98.3 F (36.8 C)  SpO2: 97% 98%    Nursing Triage Note reviewed. Vital signs reviewed and patients oxygen  saturation is normoxic  General: Patient is well nourished, well developed, awake and alert, mild tremors; Head: Normocephalic and atraumatic Eyes: Normal inspection, extraocular muscles intact, no conjunctival pallor Ear, nose, throat: Normal external exam Neck: Normal range of motion, ACDF incision clean dry intact without erythema Respiratory: Patient is in no respiratory distress, lungs very mild wheezes in apices Cardiovascular: Patient is tachycardic, RR GI: Abd SNT with no guarding or rebound  Back: Normal inspection of the back with good strength and range of motion throughout all ext Extremities: pulses intact with good cap refills, no LE pitting edema or calf tenderness Patient does have a toenail with a hematoma and does have tenderness to palpation over the dorsum of his foot.  His left lower extremity is slightly more edematous than his right Neuro: The patient is alert and oriented to person, place, and time, appropriately conversive, but easily confused sensation grossly intact.  Patient has left upper extremity.  Weakness held in a  chronic contracture, he has 5 of 5 strength in his right upper extremity which he states is baseline, he has 4 out of 5 strength in his bilateral lower extremities which he states is baseline.  He has no asterixis but he does have tongue fasciculations Psych: normal mood and affect, no SI or HI  ED Results / Procedures / Treatments   Labs (all labs ordered are listed, but only abnormal results are displayed) Labs Reviewed  CBC WITH DIFFERENTIAL/PLATELET - Abnormal; Notable for the following components:      Result Value   RBC 4.21 (*)    All other components within normal limits  BASIC METABOLIC PANEL WITH GFR - Abnormal; Notable for the following components:   Sodium 132 (*)    Potassium 3.3 (*)    Chloride 96 (*)    Glucose, Bld 104 (*)    BUN <5 (*)    Creatinine, Ser 0.60 (*)    All other components within normal limits  RESP PANEL BY  RT-PCR (RSV, FLU A&B, COVID)  [id number]  ETHANOL     EKG EKG and rhythm strip are interpreted by myself:   EKG: tachycardic sinus rhythm] at heart rate of 96, normal QRS duration, QTc 465, nonspecific ST segments and T waves no ectopy EKG not consistent with Acute STEMI Rhythm strip: tacycardic rhythm in lead II   RADIOLOGY Doppler ultrasound LLE: No DVT Left foot x-ray: No acute fracture on my independent read and interpretation radiologist agrees Chest x-ray: Pending CT head: No intracranial hemorrhage on my independent review interpretation radiologist agrees CT C-spine: No acute abnormality on my independent review interpretation    PROCEDURES:  Critical Care performed: No  Procedures   MEDICATIONS ORDERED IN ED: Medications  ipratropium-albuterol  (DUONEB) 0.5-2.5 (3) MG/3ML nebulizer solution 3 mL (3 mLs Nebulization Given 02/14/24 2250)  PHENObarbital (LUMINAL) injection 130 mg (130 mg Intravenous Given 02/14/24 2249)     IMPRESSION / MDM / ASSESSMENT AND PLAN / ED COURSE                                Differential diagnosis includes, but is not limited to, intracranial hemorrhage, hardware displacement of cervical spine, DVT, foot fracture, electrolyte derangement anemia, pneumonia upper respiratory infection   ED course: Patient arrives and has no focal neurological deficits.  He does arrive tachycardic and with an elevated blood pressure with tongue fasciculations and I do think he may be in mild withdrawal possibly mixed with mild intoxication.  I have administered a dose of phenobarbital IV.  Doppler ultrasound demonstrated no DVT.  CT head and CT C-spine demonstrated no acute abnormality.  I have written him for DuoNeb for his cough.  Patient signed out to oncoming physician at 11:10 PM pending chest x-ray, alcohol  level, and careful reassessment after interventions   Clinical Course as of 02/14/24 2321  Sat Feb 14, 2024  2248 DG Foot 2 Views Left No acute  abnormality on my independent review interpretation [HD]  2248 US  Venous Img Lower Unilateral Left (DVT) No DVT [HD]    Clinical Course User Index [HD] Nicholaus Rolland BRAVO, MD   -- Risk: 5 This patient has a high risk of morbidity due to further diagnostic testing or treatment. Rationale: This patient's evaluation and management involve a high risk of morbidity due to the potential severity of presenting symptoms, need for diagnostic testing, and/or initiation of treatment that may require close monitoring. The  differential includes conditions with potential for significant deterioration or requiring escalation of care. Treatment decisions in the ED, including medication administration, procedural interventions, or disposition planning, reflect this level of risk. COPA: 5 The patient has the following acute or chronic illness/injury that poses a possible threat to life or bodily function: [X] : The patient has a potentially serious acute condition or an acute exacerbation of a chronic illness requiring urgent evaluation and management in the Emergency Department. The clinical presentation necessitates immediate consideration of life-threatening or function-threatening diagnoses, even if they are ultimately ruled out.   FINAL CLINICAL IMPRESSION(S) / ED DIAGNOSES   Final diagnoses:  Body aches  Alcohol  dependence with unspecified alcohol -induced disorder (HCC)  Fall from standing, initial encounter  Neck pain  Subacute cough     Rx / DC Orders   ED Discharge Orders     None        Note:  This document was prepared using Dragon voice recognition software and may include unintentional dictation errors.   Nicholaus Rolland BRAVO, MD 02/14/24 [phone]

## 2024-02-14 NOTE — ED Notes (Signed)
 Patient transported to CT

## 2024-02-14 NOTE — ED Provider Notes (Signed)
-----------------------------------------   11:09 PM on 02/14/2024 -----------------------------------------  Assuming care from Dr. Nicholaus.  In short, Scott [name] is a 63 y.o. male with a chief complaint of multiple issues likely related to alcohol  use, falls, etc.  Refer to the original H&P for additional details.  The current plan of care is to follow up CXR and reassess to determine suitability for discharge.   Clinical Course as of 02/15/24 0051  Sat Feb 14, 2024  2248 DG Foot 2 Views Left No acute abnormality on my independent review interpretation [HD]  2248 US  Venous Img Lower Unilateral Left (DVT) No DVT [HD]  Sun Feb 15, 2024  0002 DG Chest Portable 1 View I independently viewed and interpreted the patient's chest x-ray(s), and I also reviewed the radiologist's report.  I see no evidence of acute trauma such as rib fractures or pneumothorax.  Radiologist concurred, no acute findings, remote rib fractures were noted. [CF]  0031 Alcohol , Ethyl (B)(!): 15 [CF]  0050 Patient is awake and alert and doing well with no complaints.  Stable vital signs, no tachycardia.  I went over his results with him and the reassuring findings.  [name] medical screening exam is reassuring with no indication of an emergent medical condition requiring hospitalization or additional evaluation at this point.  The patient is safe and appropriate for discharge and outpatient follow up.  He is ready to go and is going to call around to try to find a ride.  I gave my usual return precautions and follow-up recommendations. [CF]    Clinical Course User Index [CF] Gordan Huxley, MD [HD] Nicholaus Rolland BRAVO, MD     Medications  ipratropium-albuterol  (DUONEB) 0.5-2.5 (3) MG/3ML nebulizer solution 3 mL (3 mLs Nebulization Given 02/14/24 2250)  PHENObarbital (LUMINAL) injection 130 mg (130 mg Intravenous Given 02/14/24 2249)     ED Discharge Orders     None      Final diagnoses:  Body aches  Alcohol   dependence with unspecified alcohol -induced disorder (HCC)  Fall from standing, initial encounter  Neck pain  Subacute cough     Gordan Huxley, MD 02/15/24 [phone]

## 2024-02-15 LAB — ETHANOL: Alcohol, Ethyl (B): 15 mg/dL — ABNORMAL HIGH (ref <15)

## 2024-02-15 NOTE — Discharge Instructions (Signed)
 Your workup in the Emergency Department today was reassuring.  We did not find any specific abnormalities.  We recommend you drink plenty of fluids, take your regular medications and/or any new ones prescribed today, and follow up with the doctor(s) listed in these documents as recommended.  Return to the Emergency Department if you develop new or worsening symptoms that concern you.

## 2024-02-16 ENCOUNTER — Ambulatory Visit

## 2024-02-16 ENCOUNTER — Ambulatory Visit (INDEPENDENT_AMBULATORY_CARE_PROVIDER_SITE_OTHER): Admitting: Neurosurgery

## 2024-02-16 ENCOUNTER — Inpatient Hospital Stay: Admission: RE | Admit: 2024-02-16 | Source: Ambulatory Visit

## 2024-02-16 ENCOUNTER — Encounter: Payer: Self-pay | Admitting: Neurosurgery

## 2024-02-16 VITALS — BP 140/90 | Temp 97.7°F

## 2024-02-16 DIAGNOSIS — M47812 Spondylosis without myelopathy or radiculopathy, cervical region: Secondary | ICD-10-CM | POA: Diagnosis not present

## 2024-02-16 DIAGNOSIS — I6523 Occlusion and stenosis of bilateral carotid arteries: Secondary | ICD-10-CM | POA: Diagnosis not present

## 2024-02-16 DIAGNOSIS — M4312 Spondylolisthesis, cervical region: Secondary | ICD-10-CM

## 2024-02-16 DIAGNOSIS — M4310 Spondylolisthesis, site unspecified: Secondary | ICD-10-CM | POA: Diagnosis not present

## 2024-02-16 DIAGNOSIS — Z049 Encounter for examination and observation for unspecified reason: Secondary | ICD-10-CM

## 2024-02-16 DIAGNOSIS — Z981 Arthrodesis status: Secondary | ICD-10-CM | POA: Diagnosis not present

## 2024-02-16 DIAGNOSIS — G992 Myelopathy in diseases classified elsewhere: Secondary | ICD-10-CM

## 2024-02-16 DIAGNOSIS — M4802 Spinal stenosis, cervical region: Secondary | ICD-10-CM

## 2024-02-17 ENCOUNTER — Ambulatory Visit

## 2024-02-17 ENCOUNTER — Telehealth: Payer: Self-pay

## 2024-02-17 NOTE — Telephone Encounter (Signed)
 Copied from CRM [id number]. Topic: Clinical - Medical Advice >> Feb 17, 2024 11:29 AM Emylou G wrote: Reason for CRM: MAGGIE TURNER - checking status of someone coming to his house to help him at the house.. looking for 5 days a week?  They had spoke with us  last week.SABRA Pls call patient back

## 2024-02-18 ENCOUNTER — Telehealth: Payer: Self-pay

## 2024-02-18 ENCOUNTER — Telehealth: Payer: Self-pay | Admitting: Physician Assistant

## 2024-02-18 ENCOUNTER — Other Ambulatory Visit: Payer: Self-pay

## 2024-02-18 ENCOUNTER — Telehealth: Payer: Self-pay | Admitting: Neurosurgery

## 2024-02-18 NOTE — Telephone Encounter (Signed)
 We will keep an eye out for paperwork and check with Dr. JENEANE once we receive it to discuss further.

## 2024-02-18 NOTE — Telephone Encounter (Signed)
 Patient is calling to request a referral be placed for Occupational Therapy for his hands be sent to [facility] on [employer] in [place].

## 2024-02-18 NOTE — Patient Outreach (Signed)
 Social Drivers of Health  Community Resource and Care Coordination Visit Note   02/18/2024  Name: Scott [name] MRN: [id number] DOB:[birth year]  Situation: Referral received for [facility] needs assessment and assistance related to Food Insecurity  utilties and PCS. I obtained verbal consent from Patient.  Visit completed with Patient on the phone.   Background:      Assessment:   Goals Addressed             This Visit's Progress    COMPLETED: BSW VBCI Social Work Care Plan       Problems:   Nurse support in home support/PCS  CSW Clinical Goal(s):   Over the next 30 days the Patient will work with Child Psychotherapist to address concerns related to AVAYA .  Interventions:  SW will contact insurance to check for eligibility of PCS SW will mail resources for food and utilities  Patient Goals/Self-Care Activities:  Patient will utilize resources SW sent. Patient will contact DSS to inquire about Medicaid being switched.  Plan:   The patient has been provided with contact information for the care management team and has been advised to call with any health related questions or concerns.         Recommendation:   Contact DSS to switch Medicaid  Follow Up Plan:   Patient has achieved all patient stated goals. [employer] will be closed. Patient has been provided contact information should new needs arise.   Scott [name], Scott [name], MHA [place]  [facility] Social Worker, Population Health [phone]

## 2024-02-18 NOTE — Patient Instructions (Signed)

## 2024-02-18 NOTE — Telephone Encounter (Signed)
 Copied from CRM #[id number]. Topic: General - Other >> Feb 18, 2024  1:15 PM [name] wrote: Reason for CRM: will be dropping off paperwork regarding returning to work, needs to filled out and faxed to employer

## 2024-02-18 NOTE — Telephone Encounter (Signed)
 Patient would like to confirm whether they need to follow up with their Rheumatologist or Podiatrist, or if the upcoming appointment should be postponed. They are concerned about the risk of additional procedures or incisions that could interfere with healing from their recent surgery.

## 2024-02-19 ENCOUNTER — Other Ambulatory Visit: Payer: Self-pay | Admitting: Physician Assistant

## 2024-02-19 ENCOUNTER — Telehealth: Payer: Self-pay

## 2024-02-19 DIAGNOSIS — R29898 Other symptoms and signs involving the musculoskeletal system: Secondary | ICD-10-CM

## 2024-02-19 NOTE — Telephone Encounter (Signed)
 Copied from CRM [id number]. Topic: General - Other >> Feb 19, 2024  4:08 PM [name] wrote: Reason for CRM: patient returned call, gave message about OT for hand

## 2024-02-19 NOTE — Telephone Encounter (Signed)
 Attempted to call patient to let him know but voicemail was not set up

## 2024-02-19 NOTE — Telephone Encounter (Signed)
 Left message for Scott [name] (on DPR) to return call OK  to advise  still looking for an agency that will take pateints insurance

## 2024-02-20 ENCOUNTER — Other Ambulatory Visit: Payer: Self-pay

## 2024-02-20 NOTE — Patient Instructions (Signed)
 Visit Information  Thank you for taking time to visit with me today. Please don't hesitate to contact me if I can be of assistance to you before our next scheduled appointment.  Your next care management appointment is by telephone on Friday, November 14th at 2:30pm.   Please call the care guide team at [phone] if you need to cancel, schedule, or reschedule an appointment.   A reminder to ALL patients/family/friends, please call the [place]  National Suicide Prevention Lifeline: 337 389 3794 or TTY: 475-597-7540 TTY [phone]) to talk to a trained counselor if you are experiencing a Mental Health or Behavioral Health Crisis or need someone to talk to.  Santana Stamp BSN, CCM [place]  VBCI Population Health RN Care Manager Direct Dial: [phone]  Fax: [phone]

## 2024-02-20 NOTE — Progress Notes (Signed)
   DOS: 01/08/2024  HISTORY OF PRESENT ILLNESS: 02/16/2024 Mr. Scott [name] is status post anterior cervical discectomy and fusion.  He has had a significant improvement in his left upper extremity function.  Continues to have falls and myelopathy as expected.  PHYSICAL EXAMINATION:   Vitals:   02/16/24 1317  BP: (!) 140/90  Temp: 97.7 F (36.5 C)   General: Patient is well developed, well nourished, calm, collected, and in no apparent distress.  NEUROLOGICAL:  General: In no acute distress.  He does continue to have multiple scratches and superficial traumas and skin breakdown throughout his bilateral upper extremities with multiple areas of bruising.   Awake, alert, oriented to person, place, and time. Pupils equal round and reactive to light.   Strength: He has had a significant improvement in his walking ability as well as his left upper extremity function including his hand function.  His right upper extremity function continues to be quite blunted.   Incision c/d/i   ROS (Neurologic): Negative except as noted above  IMAGING: No evidence of acute hardware breakdown.  Will continue to follow for the formal read.  ASSESSMENT/PLAN:  Scott [name] is here today after a anterior cervical discectomy and fusion for severe progressive cervical myeloradiculopathy.  He continues to have severe deficits but has had improvement in his left upper extremity and ability to walk.  Although he is having clinical improvement he still continues to have severe deficits as expected.  Will continue to follow him closely.  We did discuss even at the initial visit that he may need a posterior decompression and fusion, however he would require them to stop smoking as he has very high risk for pseudoarthrosis.  He understands this wants to continue working with his physical and Occupational Therapy.  However unfortunately he did have a admission to a rehab center and felt that the place was infested and  unsafe for his health so he discharged himself and is currently looking for a new set up.  I did let him know the importance of physical and Occupational Therapy and his recovery and that not doing so would likely limit his recovery and increase his chance of having complications postoperatively.  Penne MICAEL Sharps, MD/MS Department of Neurosurgery

## 2024-02-20 NOTE — Patient Outreach (Signed)
 Complex Care Management   Visit Note  02/20/2024  Name:  Scott [name] MRN: [id number] DOB: July 10, 1960  Situation: Referral received for Complex Care Management related to SDOH needs; Impaired Mobility. I obtained verbal consent from Patient.  Visit completed with Scott [name]  on the phone. Calling patient sooner due to recent ED visit on 02/14/24 for body aches, no acute findings. He is awaiting insurance approval for outpatient PT/hand therapy as ordered by PCP and Neurology, respectively.    Background:   Past Medical History:  Diagnosis Date   AAA (abdominal aortic aneurysm) without rupture 02/23/2020   a.) AAA duplex 02/23/2020: 3.8 cm; b.) AAA duplex 11/28/2020: 3.7 cm; c.) CTA AO + BIFEM 12/18/2020: 3.8 x 3.4 cm; d.) AAA duplex 11/07/2021: 3.7 cm; e.) CTA AO + BIFEM 11/16/2021: 4.2 cm; f.) AAA duplex 05/28/2022: 4.0 cm; g.) AAA duplex 11/26/2022: 4.4 cm; h.) PET CT 10/01/2022: 4.3 cm; i.) AAA duplex 08/06/2023: 4.8 cm; j.) CTA abd/pel 11/20/2023: 4.8 x 4.6 cm   Alcoholism (HCC)    Anginal pain    Anxiety    Aortic atherosclerosis    Aortic root dilatation    Arthritis    Bilateral carotid artery disease 06/20/2020   a.) carotid doppler 06/20/2020: 1-39% RICA, 40-59% LICA; b.) carotid doppler 11/28/2020, 11/26/2022: 1-39% BICA   Bilateral inguinal hernia    CAD (coronary artery disease) 06/03/2017   a.) LHC 06/03/2017: 25% pLAD - med mgmt; b.) LHC 08/22/2021: 25% pLAD, 15% o-mLM, 25% dLM-pLAD, 25% o-mLCx, 25% oD2 - med mgmt   Carpal tunnel syndrome    Cigarette smoker    COPD (chronic obstructive pulmonary disease) (HCC)    COVID-19 2022   Deep vein thrombosis (DVT) of left lower extremity (HCC)    Depression    Diastolic dysfunction    Diverticulosis    Dupuytren contracture of both hands    Dyspnea    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    a.) small area of chronic encephalomalacia in the LEFT inferior temporal gyrus   GERD (gastroesophageal reflux disease)     Hepatic steatosis    HLD (hyperlipidemia)    Hypertension    Incomplete left bundle branch block (LBBB)    Intermittent chest pain 03/28/2017   Last Assessment & Plan:   Formatting of this note might be different from the original.  Stable. 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   a.) PET CT 10/01/2022: 6 mm with SUV max of 6.4; b.) s.p FNA 11/05/2022 --> pathology negative for malignancy (DDx oncocytosis vs oncocytoma)   PAOD (peripheral arterial occlusive disease)    a.) s/p BILATERAL femoral endarterectomies 12/29/2020; b.) s/p aorto-bifemoral bypass   Peripheral edema 03/19/2021   Pre-diabetes    Stenosis of cervical spine with myelopathy (HCC)    SVT (supraventricular tachycardia)    a.) s/p ablation 02/28/2016   Vitamin B12 deficiency    Vitamin D deficiency     Assessment: Patient Reported Symptoms:  Cognitive Cognitive Status: Alert and oriented to person, place, and time, Insightful and able to interpret abstract concepts, Normal speech and language skills, No symptoms reported Cognitive/Intellectual Conditions Management [RPT]: None reported or documented in medical history or problem list      Neurological Neurological Review of Symptoms: No symptoms reported    HEENT HEENT Symptoms Reported: No symptoms reported      Cardiovascular Cardiovascular Symptoms Reported: No symptoms reported    Respiratory Respiratory Symptoms  Reported: No symptoms reported Additional Respiratory Details: Reports use of Symbicort and Duoneb as ordered,    Endocrine Endocrine Symptoms Reported: Not assessed Is patient diabetic?: No    Gastrointestinal Gastrointestinal Symptoms Reported: Diarrhea Additional Gastrointestinal Details: Continues to have diarrhea, believes he needs to change his diet, educated on eating bland foods that bulk up stool, BRAT diet, drink water  throughout the day to stay hydrated, cut back on beer and caffeine. He will  discuss with PCP at 02/27/24 OV. Gastrointestinal Self-Management Outcome: 3 (uncertain)    Genitourinary Genitourinary Symptoms Reported: No symptoms reported    Integumentary Integumentary Symptoms Reported: No symptoms reported    Musculoskeletal Musculoskelatal Symptoms Reviewed: Difficulty walking Additional Musculoskeletal Details: Walking is improving, having difficulty using hands due to carpel tunnel.  PCP ordered outpatient PT/OT, and Neurology ordered hand therapy, awaiting insurance approval.  Denies falls since 01/29/24.        Psychosocial       Quality of Family Relationships: supportive, involved (Lives with sister who encourages eating well.)    02/20/2024    PHQ2-9 Depression Screening   Little interest or pleasure in doing things    Feeling down, depressed, or hopeless    PHQ-2 - Total Score    Trouble falling or staying asleep, or sleeping too much    Feeling tired or having little energy    Poor appetite or overeating     Feeling bad about yourself - or that you are a failure or have let yourself or your family down    Trouble concentrating on things, such as reading the newspaper or watching television    Moving or speaking so slowly that other people could have noticed.  Or the opposite - being so fidgety or restless that you have been moving around a lot more than usual    Thoughts that you would be better off dead, or hurting yourself in some way    PHQ2-9 Total Score    If you checked off any problems, how difficult have these problems made it for you to do your work, take care of things at home, or get along with other people    Depression Interventions/Treatment      There were no vitals filed for this visit.  MEDICATIONS: Denies problems with meds today, no concerns with refills, taking as prescribed.    Recommendation:   Discussed upcoming appts: PCP follow up 03/08/24 Specialty provider follow-up : Podiatry 02/25/24; Neurosurgery 03/31/24;  awaiting insurance approval for outpatient PT/OT/Hand therapy; Educated on ways to bulk up stool, avoiding alcohol , drinking more water , BRAT diet   Follow Up Plan:   Telephone follow-up two weeks.  Santana Stamp BSN, CCM [place]  VBCI Population Health RN Care Manager Direct Dial: [phone]  Fax: [phone]

## 2024-02-25 ENCOUNTER — Ambulatory Visit: Admitting: Podiatry

## 2024-02-26 ENCOUNTER — Telehealth

## 2024-02-27 ENCOUNTER — Ambulatory Visit

## 2024-02-27 ENCOUNTER — Ambulatory Visit: Payer: Self-pay

## 2024-02-27 NOTE — Telephone Encounter (Signed)
 Patient notified he can be seen on Monday

## 2024-02-27 NOTE — Telephone Encounter (Signed)
 FYI Only or Action Required?: FYI only for provider: pt will continue with home care and BRAT diet.  Patient was last seen in primary care on 02/09/2024 by Zafirov, Clarissa A, MD.  Called Nurse Triage reporting Diarrhea.  Symptoms began today.  Interventions attempted: OTC medications: imodium.  Symptoms are: unchanged.  Triage Disposition: Home Care  Patient/caregiver understands and will follow disposition?:   Reason for Disposition  MILD-MODERATE diarrhea (e.g., 1-6 times / day more than normal)  Answer Assessment - Initial Assessment Questions Patient reports severe diarrhea that began this morning. Has taken liquid imodium. Describes as brown in color.  1. DIARRHEA SEVERITY:      3 times this AM  2. ONSET: When did the diarrhea begin?      This AM  3. STOOL DESCRIPTION:  How loose or watery is the diarrhea? What is the stool color? Is there any blood or mucous in the stool?     Brown, loose  4. VOMITING: Are you also vomiting? If Yes, ask: How many times in the past 24 hours?      Deniese  5. ABDOMEN PAIN: Are you having any abdomen pain? If Yes, ask: What does it feel like? (e.g., crampy, dull, intermittent, constant)      Denies  6. ABDOMEN PAIN SEVERITY: If present, ask: How bad is the pain?  (e.g., Scale 1-10; mild, moderate, or severe)     NA  Protocols used: Diarrhea-A-AH   Past Medical History:  Diagnosis Date   AAA (abdominal aortic aneurysm) without rupture 02/23/2020   a.) AAA duplex 02/23/2020: 3.8 cm; b.) AAA duplex 11/28/2020: 3.7 cm; c.) CTA AO + BIFEM 12/18/2020: 3.8 x 3.4 cm; d.) AAA duplex 11/07/2021: 3.7 cm; e.) CTA AO + BIFEM 11/16/2021: 4.2 cm; f.) AAA duplex 05/28/2022: 4.0 cm; g.) AAA duplex 11/26/2022: 4.4 cm; h.) PET CT 10/01/2022: 4.3 cm; i.) AAA duplex 08/06/2023: 4.8 cm; j.) CTA abd/pel 11/20/2023: 4.8 x 4.6 cm   Alcoholism (HCC)    Anginal pain    Anxiety    Aortic atherosclerosis    Aortic root dilatation     Arthritis    Bilateral carotid artery disease 06/20/2020   a.) carotid doppler 06/20/2020: 1-39% RICA, 40-59% LICA; b.) carotid doppler 11/28/2020, 11/26/2022: 1-39% BICA   Bilateral inguinal hernia    CAD (coronary artery disease) 06/03/2017   a.) LHC 06/03/2017: 25% pLAD - med mgmt; b.) LHC 08/22/2021: 25% pLAD, 15% o-mLM, 25% dLM-pLAD, 25% o-mLCx, 25% oD2 - med mgmt   Carpal tunnel syndrome    Cigarette smoker    COPD (chronic obstructive pulmonary disease) (HCC)    COVID-19 2022   Deep vein thrombosis (DVT) of left lower extremity (HCC)    Depression    Diastolic dysfunction    Diverticulosis    Dupuytren contracture of both hands    Dyspnea    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  Stable. 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

## 2024-02-27 NOTE — Progress Notes (Deleted)
            Progress Note  Physician: Ginette Bradway A Kenyana Husak, MD   Patient contacted 02/27/24 at 10:40 AM EST by a video enabled telemedicine application and verified that I am speaking with the correct person.   Patient is aware of limitations of evaluation by telemedicine The patient expressed understanding and agreed to proceed.   HPI: Scott [name] is a 63 y.o. male presenting on 02/27/2024 for No chief complaint on file. .     Social history:  Relevant past medical, surgical, family and social history reviewed and updated as indicated. Interim medical history since our last visit reviewed.  Allergies and medications reviewed and updated.  DATA REVIEWED: CHART IN EPIC  ROS: Negative unless specifically indicated above in HPI.    Current Outpatient Medications:    acetaminophen  (TYLENOL ) 500 MG tablet, Take 2 tablets (1,000 mg total) by mouth every 6 (six) hours as needed., Disp: 30 tablet, Rfl: 0   APIXABAN  (ELIQUIS ) VTE STARTER PACK (10MG  AND 5MG ), Take as directed on package: start with two-5mg  tablets twice daily for 7 days. On day 8, switch to one-5mg  tablet twice daily., Disp: 74 each, Rfl: 0   atorvastatin  (LIPITOR) 40 MG tablet, Take 40 mg by mouth daily., Disp: , Rfl:    budesonide-formoterol  (SYMBICORT) 160-4.5 MCG/ACT inhaler, Inhale 2 puffs into the lungs 2 (two) times daily., Disp: , Rfl:    cholecalciferol (VITAMIN D3) 25 MCG (1000 UNIT) tablet, Take 1,000 Units by mouth daily., Disp: , Rfl:    cloNIDine  (CATAPRES ) 0.1 MG tablet, Take 0.1-0.2 mg by mouth See admin instructions. 0.2 mg every morning. If bp elevated later in the day, pt will take 0.1 mg tablet if needed, Disp: , Rfl:    ipratropium-albuterol  (DUONEB) 0.5-2.5 (3) MG/3ML SOLN, Inhale 3 mLs into the lungs every 6 (six) hours as needed., Disp: , Rfl:    lisinopril -hydrochlorothiazide  (ZESTORETIC ) 20-12.5 MG tablet, Take 1 tablet by mouth daily., Disp: , Rfl:    metoprolol  succinate (TOPROL -XL) 100 MG 24  hr tablet, Take 100 mg by mouth every morning. Take with or immediately following a meal., Disp: , Rfl:    Potassium 99 MG TABS, Take 1 tablet by mouth daily at 6 (six) AM., Disp: , Rfl:    pregabalin  (LYRICA ) 50 MG capsule, Take 1 capsule (50 mg total) by mouth 2 (two) times daily. (Patient not taking: Reported on 02/16/2024), Disp: 60 capsule, Rfl: 1   tiZANidine  (ZANAFLEX ) 4 MG tablet, TAKE 1/2 TO 1 TABLET (2-4 MG TOTAL) BY MOUTH EVERY 8 HOURS AS NEEDED, Disp: 270 tablet, Rfl: 1   vitamin B-12 (CYANOCOBALAMIN ) 500 MCG tablet, Take 500 mcg by mouth daily., Disp: , Rfl:       Objective:  Telephone visit     Assessment & Plan:  There are no diagnoses linked to this encounter.   No follow-ups on file.

## 2024-03-01 ENCOUNTER — Telehealth

## 2024-03-01 DIAGNOSIS — R531 Weakness: Secondary | ICD-10-CM

## 2024-03-01 DIAGNOSIS — F17200 Nicotine dependence, unspecified, uncomplicated: Secondary | ICD-10-CM | POA: Diagnosis not present

## 2024-03-01 DIAGNOSIS — Z86718 Personal history of other venous thrombosis and embolism: Secondary | ICD-10-CM

## 2024-03-01 DIAGNOSIS — J3089 Other allergic rhinitis: Secondary | ICD-10-CM

## 2024-03-01 DIAGNOSIS — J449 Chronic obstructive pulmonary disease, unspecified: Secondary | ICD-10-CM

## 2024-03-01 DIAGNOSIS — F419 Anxiety disorder, unspecified: Secondary | ICD-10-CM | POA: Diagnosis not present

## 2024-03-01 DIAGNOSIS — F17210 Nicotine dependence, cigarettes, uncomplicated: Secondary | ICD-10-CM | POA: Diagnosis not present

## 2024-03-01 DIAGNOSIS — Z789 Other specified health status: Secondary | ICD-10-CM

## 2024-03-01 NOTE — Progress Notes (Signed)
 Progress Note  Physician: Shelia Kingsberry A Amoria Mclees, MD   HPI: Scott [name] is a 63 y.o. male presenting on 03/01/2024 for No chief complaint on file.  Virtual Visit via Video Note  I connected with Scott [name] on 03/01/24 at  9:40 AM EST by a video enabled telemedicine application and verified that I am speaking with the correct person using two identifiers.  Discussed the use of AI scribe software for clinical note transcription with the patient, who gave verbal consent to proceed.  History of Present Illness   Scott [name] is a 63 year old male who presents with diarrhea and mobility issues following recent surgery.  Postoperative recovery and mobility - Recovering from surgery performed approximately two months ago - Progressing in mobility but continues to experience difficulty with walking - Working on hand strength with stress balls, achieving 700-900 repetitions with right hand and improvement in left hand  Diarrhea - Diarrhea occurs every morning around 9:00 to 9:30 AM for the last week - Stools are soft but not runny  -Using loperamide with some success.  No abd pain  Hypertension - Currently taking lisinopril  hydrochlorothiazide  for blood pressure management - Blood pressure readings average 132-133/85 mmHg over the past three weeks - Also takes clonidine  in the afternoon  COPD - No issues with breathing - Using inhalers without difficulty - continues to smoke 1 PPD despite COPD and PVD diagnosis.  - Advise attempting to reduce smoking to improve breathing and facilitate healing  Home health services - Has not initiated home health services due to insurance coverage issues         Medical history:  Relevant past medical, surgical, family and social history reviewed and updated as indicated. Interim medical history since our last visit reviewed.  Allergies and medications reviewed and updated.   ROS: Negative unless specifically indicated above  in HPI.    Current Outpatient Medications:    acetaminophen  (TYLENOL ) 500 MG tablet, Take 2 tablets (1,000 mg total) by mouth every 6 (six) hours as needed., Disp: 30 tablet, Rfl: 0   APIXABAN  (ELIQUIS ) VTE STARTER PACK (10MG  AND 5MG ), Take as directed on package: start with two-5mg  tablets twice daily for 7 days. On day 8, switch to one-5mg  tablet twice daily., Disp: 74 each, Rfl: 0   atorvastatin  (LIPITOR) 40 MG tablet, Take 40 mg by mouth daily., Disp: , Rfl:    budesonide-formoterol  (SYMBICORT) 160-4.5 MCG/ACT inhaler, Inhale 2 puffs into the lungs 2 (two) times daily., Disp: , Rfl:    cholecalciferol (VITAMIN D3) 25 MCG (1000 UNIT) tablet, Take 1,000 Units by mouth daily., Disp: , Rfl:    cloNIDine  (CATAPRES ) 0.1 MG tablet, Take 0.1-0.2 mg by mouth See admin instructions. 0.2 mg every morning. If bp elevated later in the day, pt will take 0.1 mg tablet if needed, Disp: , Rfl:    ipratropium-albuterol  (DUONEB) 0.5-2.5 (3) MG/3ML SOLN, Inhale 3 mLs into the lungs every 6 (six) hours as needed., Disp: , Rfl:    lisinopril -hydrochlorothiazide  (ZESTORETIC ) 20-12.5 MG tablet, Take 1 tablet by mouth daily., Disp: , Rfl:    metoprolol  succinate (TOPROL -XL) 100 MG 24 hr tablet, Take 100 mg by mouth every morning. Take with or immediately following a meal., Disp: , Rfl:    Potassium 99 MG TABS, Take 1 tablet by mouth daily at 6 (six) AM., Disp: , Rfl:    tiZANidine  (ZANAFLEX ) 4 MG tablet, TAKE 1/2 TO 1  TABLET (2-4 MG TOTAL) BY MOUTH EVERY 8 HOURS AS NEEDED, Disp: 270 tablet, Rfl: 1   vitamin B-12 (CYANOCOBALAMIN ) 500 MCG tablet, Take 500 mcg by mouth daily., Disp: , Rfl:        Objective:     There were no vitals taken for this visit.  Wt Readings from Last 3 Encounters:  02/14/24 150 lb (68 kg)  01/16/24 156 lb 1.4 oz (70.8 kg)  01/13/24 156 lb 1.4 oz (70.8 kg)       Assessment & Plan:   Encounter Diagnoses  Name Primary?   H/O deep venous thrombosis Yes   Daily consumption of alcohol      Generalized weakness    Tobacco use disorder    Anxiety    Seasonal allergic rhinitis due to other allergic trigger    Chronic obstructive pulmonary disease, unspecified COPD type (HCC)    Cigarette smoker     No orders of the defined types were placed in this encounter.    Assessment and Plan    Postoperative recovery after neurosurgery   Two months post-surgery, he shows improved hand function with 700-900 repetitions in the right hand and unexpected improvement in the left hand. Diarrhea affects his mobility and daily activities. Continue physical therapy to enhance mobility.  He is quite sedentary  Diarrhea   Diarrhea interferes with his morning activities, presenting as soft stools. Ensure adequate caloric intake and take Imodium in the morning.  He is taking electrolyte drinks  Hypertension   His blood pressure is controlled with lisinopril  hydrochlorothiazide  and clonidine , averaging 132-133/85 mmHg. Continue current medications   Nicotine  dependence   Continued smoking affects his breathing and general healing. Reduce smoking to improve these conditions.       Plan to recheck labs before Xmas.  He is requesting paperwork to be filled out for employment

## 2024-03-01 NOTE — Telephone Encounter (Unsigned)
 Copied from CRM [id number]. Topic: General - Other >> Mar 01, 2024 10:37 AM Myrick T wrote: Reason for CRM: patient called stated [employer]ML[employer] paperwork was dropped off by a friend and he needs all his chronic conditions, physician names and medications listed to the form he left about 2 weeks ago. Patient would like a copy of his AVS from printed for pick up by Wednesday

## 2024-03-02 ENCOUNTER — Ambulatory Visit

## 2024-03-02 DIAGNOSIS — R29898 Other symptoms and signs involving the musculoskeletal system: Secondary | ICD-10-CM | POA: Insufficient documentation

## 2024-03-02 DIAGNOSIS — R531 Weakness: Secondary | ICD-10-CM | POA: Diagnosis not present

## 2024-03-02 DIAGNOSIS — M6281 Muscle weakness (generalized): Secondary | ICD-10-CM | POA: Insufficient documentation

## 2024-03-02 DIAGNOSIS — R262 Difficulty in walking, not elsewhere classified: Secondary | ICD-10-CM | POA: Insufficient documentation

## 2024-03-02 DIAGNOSIS — R2681 Unsteadiness on feet: Secondary | ICD-10-CM | POA: Diagnosis not present

## 2024-03-02 DIAGNOSIS — Z9181 History of falling: Secondary | ICD-10-CM | POA: Diagnosis not present

## 2024-03-02 NOTE — Therapy (Signed)
 OUTPATIENT PHYSICAL THERAPY THORACOLUMBAR EVALUATION   Patient Name: Scott [name] MRN: [id number] DOB:Dec 09, 1960, 63 y.o., male Today's Date: 03/02/2024  END OF SESSION:  PT End of Session - 03/02/24 1024     Visit Number 1    Number of Visits 25    Date for Recertification  05/28/24    PT Start Time 1029    PT Stop Time 1117    PT Time Calculation (min) 48 min    Equipment Utilized During Treatment Gait belt   wheel chair   Behavior During Therapy WFL for tasks assessed/performed          Past Medical History:  Diagnosis Date   AAA (abdominal aortic aneurysm) without rupture 02/23/2020   a.) AAA duplex 02/23/2020: 3.8 cm; b.) AAA duplex 11/28/2020: 3.7 cm; c.) CTA AO + BIFEM 12/18/2020: 3.8 x 3.4 cm; d.) AAA duplex 11/07/2021: 3.7 cm; e.) CTA AO + BIFEM 11/16/2021: 4.2 cm; f.) AAA duplex 05/28/2022: 4.0 cm; g.) AAA duplex 11/26/2022: 4.4 cm; h.) PET CT 10/01/2022: 4.3 cm; i.) AAA duplex 08/06/2023: 4.8 cm; j.) CTA abd/pel 11/20/2023: 4.8 x 4.6 cm   Alcoholism (HCC)    Anginal pain    Anxiety    Aortic atherosclerosis    Aortic root dilatation    Arthritis    Bilateral carotid artery disease 06/20/2020   a.) carotid doppler 06/20/2020: 1-39% RICA, 40-59% LICA; b.) carotid doppler 11/28/2020, 11/26/2022: 1-39% BICA   Bilateral inguinal hernia    CAD (coronary artery disease) 06/03/2017   a.) LHC 06/03/2017: 25% pLAD - med mgmt; b.) LHC 08/22/2021: 25% pLAD, 15% o-mLM, 25% dLM-pLAD, 25% o-mLCx, 25% oD2 - med mgmt   Carpal tunnel syndrome    Cigarette smoker    COPD (chronic obstructive pulmonary disease) (HCC)    COVID-19 2022   Deep vein thrombosis (DVT) of left lower extremity (HCC)    Depression    Diastolic dysfunction    Diverticulosis    Dupuytren contracture of both hands    Dyspnea    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    a.) small area of chronic encephalomalacia in the LEFT inferior temporal gyrus   GERD (gastroesophageal reflux disease)     Hepatic steatosis    HLD (hyperlipidemia)    Hypertension    Incomplete left bundle branch block (LBBB)    Intermittent chest pain 03/28/2017   Last Assessment & Plan:   Formatting of this note might be different from the original.  Stable. 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   a.) PET CT 10/01/2022: 6 mm with SUV max of 6.4; b.) s.p FNA 11/05/2022 --> pathology negative for malignancy (DDx oncocytosis vs oncocytoma)   PAOD (peripheral arterial occlusive disease)    a.) s/p BILATERAL femoral endarterectomies 12/29/2020; b.) s/p aorto-bifemoral bypass   Peripheral edema 03/19/2021   Pre-diabetes    Stenosis of cervical spine with myelopathy (HCC)    SVT (supraventricular tachycardia)    a.) s/p ablation 02/28/2016   Vitamin B12 deficiency    Vitamin D deficiency    Past Surgical History:  Procedure Laterality Date   ANKLE SURGERY     bracelet   ANTERIOR CERVICAL DECOMP/DISCECTOMY FUSION N/A 01/08/2024   Procedure: ANTERIOR CERVICAL DECOMPRESSION/DISCECTOMY FUSION 1 LEVEL;  Surgeon: Claudene Penne ORN, MD;  Location: [facility];  Service: Neurosurgery;  Laterality: N/A;  C3-4 ANTERIOR CERVICAL DISCECTOMY AND FUSION   AORTO-FEMORAL BYPASS GRAFT  CHOLECYSTECTOMY  2000   COLONOSCOPY     ENDARTERECTOMY FEMORAL Bilateral 12/29/2020   Procedure: ENDARTERECTOMY FEMORAL;  Surgeon: Marea Selinda RAMAN, MD;  Location: [facility];  Service: Vascular;  Laterality: Bilateral;  Left SFA and Tibial intervention   INSERTION OF MESH Bilateral 02/12/2023   Procedure: INSERTION OF MESH;  Surgeon: Rodolph Romano, MD;  Location: [facility];  Service: General;  Laterality: Bilateral;   LEFT HEART CATH AND CORONARY ANGIOGRAPHY Left 06/03/2017   Procedure: LEFT HEART CATH AND CORONARY ANGIOGRAPHY;  Surgeon: Florencio Cara BIRCH, MD;  Location: [facility];  Service: Cardiovascular;  Laterality: Left;   LEFT HEART CATH AND CORONARY ANGIOGRAPHY N/A 08/22/2021    Procedure: LEFT HEART CATH AND CORONARY ANGIOGRAPHY;  Surgeon: Florencio Cara BIRCH, MD;  Location: [facility];  Service: Cardiovascular;  Laterality: N/A;   LOWER EXTREMITY ANGIOGRAPHY Left 02/03/2020   Procedure: LOWER EXTREMITY ANGIOGRAPHY;  Surgeon: Marea Selinda RAMAN, MD;  Location: [facility];  Service: Cardiovascular;  Laterality: Left;   SVT ABLATION N/A 02/28/2016   TONSILLECTOMY     Patient Active Problem List   Diagnosis Date Noted   Dysuria 01/28/2024   Generalized weakness 01/16/2024   Acute pulmonary embolism (HCC) 01/13/2024   Pulmonary emboli (HCC) 01/13/2024   Cervical myelopathy (HCC) 01/08/2024   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   Degenerative spondylolisthesis of cervical vertebra 12/21/2023   Carpal tunnel syndrome on left 12/21/2023   Peripheral vascular disease 04/07/2023   Chronic anticoagulation (Plavix ) 04/07/2023   Chronic neuropathic pain 04/06/2023   Left inguinal hernia 01/16/2023   Right inguinal hernia 01/16/2023   Lymphedema 11/26/2022   Suspected cerebrovascular accident 05/03/2022   DVT (deep venous thrombosis) (HCC) 01/09/2021   H/O deep venous thrombosis 01/09/2021   Atherosclerosis of artery of extremity with rest pain (HCC) 12/29/2020   Atherosclerosis of native arteries of extremity with rest pain (HCC) 12/26/2020   AAA (abdominal aortic aneurysm) without rupture 11/28/2020   Carotid stenosis 11/28/2020   Atherosclerosis of native arteries of extremity with intermittent claudication 02/01/2020   PAOD (peripheral arterial occlusive disease) 01/21/2020   Vitamin B12 deficiency 09/02/2018   Vitamin D deficiency 09/02/2018   Arthritis involving multiple sites 03/28/2017   Daily consumption of alcohol  03/28/2017   Dupuytren's contracture of both hands 03/28/2017   Status post ablation of ventricular arrhythmia 03/28/2017   SVT (supraventricular tachycardia) 12/20/2015   Cigarette smoker 06/19/2015    Hypercholesterolemia 06/19/2015   Allergic rhinitis 12/03/2013   Anxiety 12/03/2013   CAD (coronary artery disease) 12/03/2013   COPD (chronic obstructive pulmonary disease) (HCC) 12/03/2013   Hypertension 12/03/2013   Alcohol  abuse 05/26/2009   Tobacco use disorder 05/26/2009    PCP: Everlene Parris LABOR, MD  REFERRING PROVIDER: Everlene Parris LABOR, MD  REFERRING DIAG:   R53.1 (ICD-10-CM) - Generalized weakness    Rationale for Evaluation and Treatment: Rehabilitation  THERAPY DIAG:  Muscle weakness (generalized) - Plan: PT plan of care cert/re-cert  Difficulty in walking, not elsewhere classified - Plan: PT plan of care cert/re-cert  Unsteadiness on feet - Plan: PT plan of care cert/re-cert  History of falling - Plan: PT plan of care cert/re-cert  ONSET DATE: September 2025  SUBJECTIVE:  SUBJECTIVE STATEMENT: Has B UE stiffness, hands and elbows as well as B LE stiffness.   PERTINENT HISTORY:  Generalized weakness. Pt had neck surgery C3-4 January 08, 2024. Weakness from both shoulders L > R to his LE started right before the surgery. Can open his L hand now after his surgery. R hand is the same, has trigger finger R 4th digit. Started having a difficult time walking about a couple of months prior to the surgery. Pt was falling down and could not get up. Can currently stand. Does not know if his legs feel stronger after the surgery. Able to walk without AD about at least 30 ft. Walks carrying his rw and feels more balanced that way.   Was prescribed to go to a rehab facility in [facility] but turned it down due to COVID in facility and low sanitation rating.   Had balance issues before his surgery.   Has 2 plantar warts under L foot and 1 plantar wart under R foot underneath the toes which  hurts when he places pressure on his feet.   Loses balance when walking  Pt sits down all day.   Accompanied by his friend.  Feels like he has to use the bathroom when he stands up and walk.   Blood pressure is controlled per pt.   Has a blood clot L LE   Hx of ANTERIOR CERVICAL DECOMP/DISCECTOMY FUSION 01/08/2024 C3-4 ANTERIOR CERVICAL  Surgeon: Claudene Penne ORN, MD    PAIN:  Are you having pain? Mainly stiffness, not pain. Also has burning.   PRECAUTIONS: Fall and Other: abdominal aneurism (4.8 centimeters; going to fix it if it gets to 5 centimeters per pt). B carotid artery disease, DVT L LE   RED FLAGS: Bowel or bladder incontinence: No and Cauda equina syndrome: Yes: burning B inner thighs.    WEIGHT BEARING RESTRICTIONS: No  FALLS:  Has patient fallen in last 6 months? Yes. Number of falls 10-12; pt fell 4 times in 1 day after the surgery.   LIVING ENVIRONMENT: Lives with: sister Lives in: House/apartment Stairs: First floor Has following equipment at home: Vannie - 2 wheeled, Crutches, and Wheelchair (manual)  OCCUPATION: Currently not working since May 05, 2022.   PLOF: Needs assistance with ADLs  PATIENT GOALS: be able to walk better  NEXT MD VISIT:   OBJECTIVE:  Note: Objective measures were completed at Evaluation unless otherwise noted.  DIAGNOSTIC FINDINGS:  MR LUMBAR SPINE WO CONTRAST 01/16/2024  Narrative & Impression  CLINICAL DATA:  Neck pain with worsening weakness in all 4 extremities. History of cervical decompression and fusion 01/08/2024.   EXAM: MRI CERVICAL, THORACIC AND LUMBAR SPINE WITHOUT CONTRAST   TECHNIQUE: Multiplanar and multiecho pulse sequences of the cervical spine, to include the craniocervical junction and cervicothoracic junction, and thoracic and lumbar spine, were obtained without intravenous contrast.   COMPARISON:  Cervical MRI 01/12/2024 and 11/15/2023. Lumbar MRI 11/15/2023. Chest CTA 01/12/2024.  Abdominopelvic CT 11/20/2023.   FINDINGS: Technical note: Despite efforts by the technologist and patient, mild motion artifact is present on today's exam and could not be eliminated. This reduces exam sensitivity and specificity.   MRI CERVICAL SPINE FINDINGS   Alignment: The alignment is stable with straightening. No focal angulation or significant listhesis.   Vertebrae: Postsurgical changes from recent C3-4 ACDF. No hardware displacement or acute fracture identified. Heterogeneous marrow edema appears similar to prior studies, likely discogenic. Assessment limited by motion. No specific evidence of discitis or osteomyelitis.  Cord: Diffuse effacement of the CSF surrounding the cord, similar to prior studies. Possible mild T2 hyperintensity in the cord at C3, suboptimally evaluated due to motion. No epidural fluid collections are identified.   Posterior Fossa, vertebral arteries, paraspinal tissues: Visualized portions of the posterior fossa appear unremarkable.Intracranial findings are dictated separately. Preserved vertebral artery flow voids. Similar prevertebral soft tissue swelling, likely related to recent surgery.   Disc levels:   Cervical disc space assessment limited by motion artifact. No gross change from recent prior postoperative study. There is multilevel spondylosis contributing to mild spinal stenosis and variable degrees of foraminal narrowing at multiple levels. As above, possible T2 hyperintensity in the cord at C3, suboptimally evaluated due to motion.   MRI THORACIC SPINE FINDINGS   Alignment: Mild convex right scoliosis. No focal angulation or listhesis.   Vertebrae: No acute or suspicious osseous findings.   Cord:  The thoracic cord appears normal in signal and caliber.   Paraspinal and other soft tissues: No significant paraspinal abnormalities.   Disc levels:   Very mild thoracic spondylosis with mild disc bulging and  endplate osteophyte formation at T9-10, T10-11 and T11-12. No focal disc herniation, significant spinal stenosis or foraminal narrowing demonstrated.   MRI LUMBAR SPINE FINDINGS   Segmentation: Conventional anatomy assumed, with the last open disc space designated L5-S1.Concordant with prior imaging.   Alignment: Stable mild convex left scoliosis and trace retrolisthesis at L2-3.   Vertebrae: No worrisome osseous lesion, acute fracture or pars defect. Asymmetric facet arthropathy on the right at L3-4.   Conus medullaris: Extends to the L1 level and appears normal.   Paraspinal and other soft tissues: No acute paraspinal findings. Mild soft tissue edema around the right L3-4 facet joint. 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 This is suboptimally evaluated by this examination, although appears grossly similar to previous lumbar MRI and abdominal CTA.   Disc levels:   L1-2: Preserved disc height. Mild facet hypertrophy. No spinal stenosis or foraminal narrowing.   L2-3: Mild loss of disc height with annular disc bulging and endplate osteophytes. Mild facet and ligamentous hypertrophy. Unchanged mild spinal stenosis and mild lateral recess narrowing on the right. No significant foraminal narrowing or nerve root impingement.   L3-4: Mild loss of disc height with annular disc bulging, endplate osteophytes and moderate facet/ligamentous hypertrophy. Resulting moderate multifactorial spinal stenosis with asymmetric right lateral recess and right foraminal narrowing. Findings are similar to previous MRI.   L4-5: Chronic loss of disc height with annular disc bulging, facet and ligamentous hypertrophy. Similar mild spinal stenosis with asymmetric left lateral recess narrowing. Mild foraminal narrowing bilaterally without L4 nerve root impingement.   L5-S1: Mild chronic  loss of disc height with annular disc bulging and endplate osteophytes asymmetric to the left. Bilateral facet hypertrophy. Stable mild left foraminal narrowing. The spinal canal and right foramen are patent.   IMPRESSION: 1. Motion degraded examination, especially of the cervical spine. 2. Postsurgical changes from recent C3-4 ACDF. No hardware displacement or acute fracture identified. 3. Diffuse effacement of the CSF surrounding the cord in the cervical spine, similar to prior studies. Possible mild T2 hyperintensity in the cord at C3, suboptimally evaluated due to motion. 4. Given the motion limitations of this examination, consider further evaluation with CT of the cervical spine. 5. No acute findings or significant changes in the thoracic or lumbar spine. 6. Multilevel spondylosis in  the cervical, thoracic and lumbar spine, as described. 7. 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 This is suboptimally evaluated by this examination, although appears grossly similar to previous lumbar MRI and abdominal CTA. See follow up recommendations from abdominal CTA 11/20/2023.     Electronically Signed   By: Elsie Perone M.D.   On: 01/16/2024 13:31   CT Cervical Spine Wo Contrast 02/14/2024  Narrative & Impression  EXAM: CT HEAD AND CERVICAL SPINE 02/14/2024 10:23:22 PM   TECHNIQUE: CT of the head and cervical spine was performed without the administration of intravenous contrast. Multiplanar reformatted images are provided for review. Automated exposure control, iterative reconstruction, and/or weight based adjustment of the mA/kV was utilized to reduce the radiation dose to as low as reasonably achievable.   COMPARISON: MRI cervical spine 01/16/24   CLINICAL HISTORY: Fall on eliquis .   FINDINGS: CT HEAD   BRAIN AND VENTRICLES: No acute intracranial hemorrhage. No mass effect or midline shift. No  abnormal extra-axial fluid collection. No evidence of acute infarct. No hydrocephalus.   ORBITS: No acute abnormality.   SINUSES AND MASTOIDS: No acute abnormality.   SOFT TISSUES AND SKULL: No acute skull fracture. No acute soft tissue abnormality.   CT CERVICAL SPINE   BONES AND ALIGNMENT: No acute fracture or traumatic malalignment. C3-C4 ACDF. No bony fusion.  No evidence of hardware fracture or loosening.   DEGENERATIVE CHANGES: Multilevel degenerative change with canal and foraminal stenosis better characterized on recent MRI of the cervical spine from 01/15/2022   SOFT TISSUES: No prevertebral soft tissue swelling.   Lung apices: Emphysema.     IMPRESSION: 1. No acute intracranial abnormality. 2. No acute fracture or traumatic malalignment of the cervical spine. 3. C3-C4 ACDF.   Electronically signed by: Gilmore Molt MD 02/14/2024 11:06 PM EDT RP Workstation: [id number]         PATIENT SURVEYS:  LEFS  Extreme difficulty/unable (0), Quite a bit of difficulty (1), Moderate difficulty (2), Little difficulty (3), No difficulty (4) Survey date:  03/02/2024  Any of your usual work, housework or school activities 0  2. Usual hobbies, recreational or sporting activities 0  3. Getting into/out of the bath 0  4. Walking between rooms 1  5. Putting on socks/shoes 1  6. Squatting  1  7. Lifting an object, like a bag of groceries from the floor 1  8. Performing light activities around your home 0  9. Performing heavy activities around your home 0  10. Getting into/out of a car 1  11. Walking 2 blocks 0  12. Walking 1 mile 0  13. Going up/down 10 stairs (1 flight) 0  14. Standing for 1 hour 0  15.  sitting for 1 hour 0  16. Running on even ground 0  17. Running on uneven ground 0  18. Making sharp turns while running fast 0  19. Hopping  0  20. Rolling over in bed 1  Score total:  6     COGNITION: Overall cognitive status: Within functional limits for  tasks assessed     SENSATION:   MUSCLE LENGTH:   POSTURE:   PALPATION:   LUMBAR ROM:   AROM eval  Flexion   Extension   Right lateral flexion   Left lateral flexion   Right rotation   Left rotation    (Blank rows = not tested)  LOWER EXTREMITY ROM:     Passive  Right eval Left eval  Hip flexion    Hip extension    Hip abduction    Hip adduction    Hip internal rotation    Hip external rotation    Knee flexion    Knee extension    Ankle dorsiflexion    Ankle plantarflexion    Ankle inversion    Ankle eversion     (Blank rows = not tested)  LOWER EXTREMITY MMT:    MMT Right eval Left eval  Hip flexion 4- 4  Hip extension (seated manually resisted) 4+ 4  Hip abduction (seated manually resisted) 4+ 4+  Hip adduction    Hip internal rotation    Hip external rotation    Knee flexion 5 4  Knee extension 5 4+  Ankle dorsiflexion    Ankle plantarflexion    Ankle inversion    Ankle eversion     (Blank rows = not tested)  LUMBAR SPECIAL TESTS:    FUNCTIONAL TESTS:  Dynamic Gait Index: Dynamic Gait Index  Mark the lowest level that applies.   Date Performed 03/02/2024  Gait level surface (1) Moderate Impairment: Walks 20', slow speed, abnormal gait pattern, evidence for imbalance.  2. Change in gait speed (2) Mild Impairment: Is able to change speed but demonstrates mild gait deviations, or not gait deviations but unable to achieve a significant change in velocity, or uses an assistive device  3. Gait with horizontal head turns (2) Mild Impairment: Performs head turns smoothly with slight change in gait velocity, i.e., minor disruption to smooth gait path or uses walking aid  4. Gait with vertical head turns (0) Severe Impairment: Performs task with severe disruption of gait, i.e., staggers  outside 15 path, loses balance, stops, reaches for wall;  Unable to move his head to look up or down at all  5. Gait and pivot turn (0) Severe Impairment: Cannot  turn safely, requires assistance to turn and stop  6. Step over obstacle (0) Severe Impairment: Cannot perform without assistance  7. Step around obstacle (1) Moderate Impairment: Is able to clear cones but must significantly slow, speed to accomplish task, or requires verbal cueing  8. Steps (0) Severe Impairment: Cannot do safely  Total score 6/24    Score Interpretation: Score of <19 indicates high risk of falls.  Minimally Clinically Important Difference (MCID):  =DGI scores of<21/24 = 1.80 points DGI scores of >21/24 = 0.60 points   Earlston T, Inbar-Borovsky N, Brozgol M, Giladi N, [place] JM. The Dynamic Gait Index in healthy older adults: the role of stair climbing, fear of falling and gender. Gait Posture. 2009 Feb;29(2):237-41. doi: 10.1016/j.gaitpost.2008.08.013. Epub 2008 Oct 8. PMID: 81154560; PMCID: EFR7290501.  Pardasaney, MYRTIS LOIS Bonus, GEANNIE POUR., et al. (2012). Sensitivity to change and responsiveness of four balance measures for community-dwelling older adults. Physical therapy 92(3): 388-397.   TUG: 14.75 without UE assist , scissoring gait, unsteady, decreased LE control, decreased base of support.   15.78 seconds without B UE assist  (15.27 seconds average without B UE assist)   9.41 seconds with B UE assist, unsteady           GAIT: Distance walked: 30 ft  Assistive device utilized: None Level of assistance: Min A Comments: unsteady, scissoring, decreased base of support, decreased B LE control.   TREATMENT DATE: 03/02/2024  PATIENT EDUCATION:  Education details: POC Person educated: Patient Education method: Explanation Education comprehension: verbalized understanding  HOME EXERCISE PROGRAM:   ASSESSMENT:  CLINICAL IMPRESSION: Patient is a 63 y.o. male who was seen today for physical therapy evaluation and treatment  for generalized weakness. He also demonstrates altered gait pattern with unsteadiness, decreased balance, increased risk for falls, decreased functional LE strength and functional mobility, and difficulty ambulating and performing standing tasks. Pt will benefit from skilled physical therapy services to address the aforementioned deficits.   Due to pt's complex medical history and neurologic condition, pt treatment will be more appropriate at the Neuro outpatient clinic at the [facility] hospital.     OBJECTIVE IMPAIRMENTS: Abnormal gait, decreased activity tolerance, decreased balance, decreased endurance, difficulty walking, decreased ROM, impaired flexibility, improper body mechanics, postural dysfunction, and pain.   ACTIVITY LIMITATIONS: carrying, lifting, bending, standing, stairs, bathing, locomotion level, and caring for others  PARTICIPATION LIMITATIONS: driving, community activity, and occupation  PERSONAL FACTORS: Age, Fitness, Time since onset of injury/illness/exacerbation, and 3+ comorbidities: AAA, alcoholism, aortic artherosclerosis, B carotid artery disease, CAD, cigarette smoker, COPD, DVT, depression, dyspnea are also affecting patient's functional outcome.   REHAB POTENTIAL: Fair    CLINICAL DECISION MAKING: Stable/uncomplicated  EVALUATION COMPLEXITY: Moderate; complex medical history   GOALS: Goals reviewed with patient? Yes  SHORT TERM GOALS: Target date: 03/12/2024  Pt will be independent with his initial HEP  Baseline:Pt has not yet started his initial HEP (03/02/2024) Goal status: INITIAL   LONG TERM GOALS: Target date: 05/28/2024  Pt will improve his DGI score by at least 10 points as a demonstration of improved balance.  Baseline: 6/24 (03/02/2024) Goal status: INITIAL  2.  Pt will improve his LEFS score by at least 10 points as a demonstration of improved function. Baseline:  6/80 (03/02/2024) Goal status: INITIAL  3.  Pt will improve his TUG time to 12  seconds or less without UE assist to stand or sit and without AD to promote functional mobility around his home.  Baseline: 15.27 seconds average without B UE assist (03/02/2024) Goal status: INITIAL  4.  Pt will improve B LE strength by at least 1/2 MMT grade to promote ability to perform transfers as well as ambulate with less difficulty.  Baseline:  MMT Right eval Left eval  Hip flexion 4- 4  Hip extension (seated manually resisted) 4+ 4  Hip abduction (seated manually resisted) 4+ 4+  Knee flexion 5 4  Knee extension 5 4+   Goal status: INITIAL  5.  Pt will be able to ambulate at least 500 ft without AD safely to promote mobility.  Baseline: Pt currently able to ambulate short distances, about 30 ft without AD but with PT min to CGA. Uses a wc to enter and exit clinic. (03/02/2024) Goal status: INITIAL   PLAN:  PT FREQUENCY: 1-2x/week  PT DURATION: 12 weeks  PLANNED INTERVENTIONS: 97110-Therapeutic exercises, 97530- Therapeutic activity, 97112- Neuromuscular re-education, 97535- Self Care, 02859- Manual therapy, [phone]- Gait training, [phone]- Electrical stimulation (unattended), [phone]- Ionotophoresis 4mg /ml Dexamethasone , 79439 (1-2 muscles), [phone] (3+ muscles)- Dry Needling, Patient/Family education, Balance training, Stair training, Joint mobilization, and Wheelchair mobility training.  PLAN FOR NEXT SESSION: general UE and LE strengthening, balance, gait, manual techniques, modalities PRN   Brianah Hopson, PT, DPT 03/02/2024, 8:02 PM

## 2024-03-05 ENCOUNTER — Ambulatory Visit

## 2024-03-05 ENCOUNTER — Other Ambulatory Visit: Payer: Self-pay

## 2024-03-05 NOTE — Patient Outreach (Signed)
 Complex Care Management   Visit Note  03/05/2024  Name:  Scott [name] MRN: [id number] DOB: [birth year]  Situation: Referral received for Complex Care Management related to Impaired Mobility I obtained verbal consent from Patient.  Visit completed with Mr. Vickerman  on the phone.  Awaiting appointment at [facility] for mobility therapy and hand therapy.    Background:   ED visit 02/14/24 01/16/24 for weakness  01/08/24 Hosp at [facility], d/c  Cervical Decompression/discectomy fusion  Past Medical History:  Diagnosis Date   AAA (abdominal aortic aneurysm) without rupture 02/23/2020   a.) AAA duplex 02/23/2020: 3.8 cm; b.) AAA duplex 11/28/2020: 3.7 cm; c.) CTA AO + BIFEM 12/18/2020: 3.8 x 3.4 cm; d.) AAA duplex 11/07/2021: 3.7 cm; e.) CTA AO + BIFEM 11/16/2021: 4.2 cm; f.) AAA duplex 05/28/2022: 4.0 cm; g.) AAA duplex 11/26/2022: 4.4 cm; h.) PET CT 10/01/2022: 4.3 cm; i.) AAA duplex 08/06/2023: 4.8 cm; j.) CTA abd/pel 11/20/2023: 4.8 x 4.6 cm   Alcoholism (HCC)    Anginal pain    Anxiety    Aortic atherosclerosis    Aortic root dilatation    Arthritis    Bilateral carotid artery disease 06/20/2020   a.) carotid doppler 06/20/2020: 1-39% RICA, 40-59% LICA; b.) carotid doppler 11/28/2020, 11/26/2022: 1-39% BICA   Bilateral inguinal hernia    CAD (coronary artery disease) 06/03/2017   a.) LHC 06/03/2017: 25% pLAD - med mgmt; b.) LHC 08/22/2021: 25% pLAD, 15% o-mLM, 25% dLM-pLAD, 25% o-mLCx, 25% oD2 - med mgmt   Carpal tunnel syndrome    Cigarette smoker    COPD (chronic obstructive pulmonary disease) (HCC)    COVID-19 2022   Deep vein thrombosis (DVT) of left lower extremity (HCC)    Depression    Diastolic dysfunction    Diverticulosis    Dupuytren contracture of both hands    Dyspnea    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    a.) small area of chronic encephalomalacia in the LEFT inferior temporal gyrus   GERD (gastroesophageal reflux disease)    Hepatic steatosis    HLD  (hyperlipidemia)    Hypertension    Incomplete left bundle branch block (LBBB)    Intermittent chest pain 03/28/2017   Last Assessment & Plan:   Formatting of this note might be different from the original.  Stable. 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   a.) PET CT 10/01/2022: 6 mm with SUV max of 6.4; b.) s.p FNA 11/05/2022 --> pathology negative for malignancy (DDx oncocytosis vs oncocytoma)   PAOD (peripheral arterial occlusive disease)    a.) s/p BILATERAL femoral endarterectomies 12/29/2020; b.) s/p aorto-bifemoral bypass   Peripheral edema 03/19/2021   Pre-diabetes    Stenosis of cervical spine with myelopathy (HCC)    SVT (supraventricular tachycardia)    a.) s/p ablation 02/28/2016   Vitamin B12 deficiency    Vitamin D deficiency     Assessment: Patient Reported Symptoms:  Cognitive Cognitive Status: No symptoms reported, Alert and oriented to person, place, and time, Insightful and able to interpret abstract concepts, Normal speech and language skills      Neurological Neurological Review of Symptoms: No symptoms reported    HEENT HEENT Symptoms Reported: Not assessed      Cardiovascular Cardiovascular Symptoms Reported: No symptoms reported    Respiratory Respiratory Symptoms Reported: No symptoms reported    Endocrine Endocrine Symptoms Reported: Not assessed Is patient diabetic?: No    Gastrointestinal  Gastrointestinal Symptoms Reported: No symptoms reported Additional Gastrointestinal Details: Diarrhea has improved      Genitourinary Genitourinary Symptoms Reported: No symptoms reported    Integumentary Integumentary Symptoms Reported: No symptoms reported    Musculoskeletal Musculoskelatal Symptoms Reviewed: Difficulty walking Additional Musculoskeletal Details: Walking is improving, having difficulty using hands due to carpel tunnel. PCP ordered outpatient PT/OT, and Neurology ordered hand therapy, first  appointment is in December, on cancellation list. Denies falls since 01/29/24.        Psychosocial Psychosocial Symptoms Reported: No symptoms reported          03/05/2024    PHQ2-9 Depression Screening   Little interest or pleasure in doing things    Feeling down, depressed, or hopeless    PHQ-2 - Total Score    Trouble falling or staying asleep, or sleeping too much    Feeling tired or having little energy    Poor appetite or overeating     Feeling bad about yourself - or that you are a failure or have let yourself or your family down    Trouble concentrating on things, such as reading the newspaper or watching television    Moving or speaking so slowly that other people could have noticed.  Or the opposite - being so fidgety or restless that you have been moving around a lot more than usual    Thoughts that you would be better off dead, or hurting yourself in some way    PHQ2-9 Total Score    If you checked off any problems, how difficult have these problems made it for you to do your work, take care of things at home, or get along with other people    Depression Interventions/Treatment      There were no vitals filed for this visit. Pain Scale: 0-10 Pain Score: 0-No pain  MEDICATIONS: No problems with meds, taking as prescribed, no concerns with refills.    Recommendation:   Specialty provider follow-up :Neurology 03/31/24, outpatient PT for mobility/hand starts 12/01/2 Continue Current Plan of Care  Follow Up Plan:   Telephone follow-up three weeks.  Santana Stamp BSN, CCM [place]  [facility] Population Health RN Care Manager Direct Dial: [phone]  Fax: [phone]

## 2024-03-05 NOTE — Patient Instructions (Signed)
 Visit Information  Thank you for taking time to visit with me today. Please don't hesitate to contact me if I can be of assistance to you before our next scheduled appointment.  Your next care management appointment is by telephone on Friday, December 5th at 2:30pm.   Please call the care guide team at [phone] if you need to cancel, schedule, or reschedule an appointment.   Please call the [place]  National Suicide Prevention Lifeline: (365) 121-1596 or TTY: 864-694-3659 TTY (878) 062-1775) to talk to a trained counselor if you are experiencing a Mental Health or Behavioral Health Crisis or need someone to talk to.  Santana Stamp BSN, CCM   VBC[facility] Population Health RN Care Manager Direct Dial: [phone]  Fax: [phone]

## 2024-03-08 ENCOUNTER — Ambulatory Visit

## 2024-03-10 ENCOUNTER — Ambulatory Visit

## 2024-03-11 ENCOUNTER — Ambulatory Visit

## 2024-03-11 ENCOUNTER — Ambulatory Visit: Admitting: Occupational Therapy

## 2024-03-16 ENCOUNTER — Other Ambulatory Visit: Payer: Self-pay | Admitting: Physician Assistant

## 2024-03-16 ENCOUNTER — Other Ambulatory Visit: Payer: Self-pay

## 2024-03-16 DIAGNOSIS — M4302 Spondylolysis, cervical region: Secondary | ICD-10-CM

## 2024-03-22 ENCOUNTER — Ambulatory Visit

## 2024-03-23 ENCOUNTER — Ambulatory Visit

## 2024-03-24 ENCOUNTER — Ambulatory Visit

## 2024-03-24 ENCOUNTER — Ambulatory Visit: Admitting: Occupational Therapy

## 2024-03-24 ENCOUNTER — Telehealth: Payer: Self-pay

## 2024-03-24 NOTE — Telephone Encounter (Signed)
 Copied from CRM #[id number]. Topic: Referral - Request for Referral >> Mar 24, 2024 10:11 AM Robinson H wrote: Did the patient discuss referral with their provider in the last year? Yes (If No - schedule appointment) (If Yes - send message)  Appointment offered? No  Type of order/referral and detailed reason for visit: Patient has been going to [facility] but because of his insurance changing beginning of the year to [place] they won't cover [facility] network only [employer], so patient needs new referrals sent within [facility], Diplomatic Services Operational Officer, Publishing Rights Manager all in [employer] of office, provider, location: [facility]  If referral order, have you been seen by this specialty before? Yes (If Yes, this issue or another issue? When? Where?[facility] last 10 years  Can we respond through MyChart? Yes

## 2024-03-26 ENCOUNTER — Other Ambulatory Visit: Payer: Self-pay

## 2024-03-26 NOTE — Patient Outreach (Signed)
 Complex Care Management   Visit Note  03/26/2024  Name:  Scott [name] MRN: [id number] DOB: Dec 24, 1960  Situation: Referral received for Complex Care Management related to Impaired Mobility. I obtained verbal consent from Patient.  Visit completed with Mr. Sedlacek  on the phone  Background:   Past Medical History:  Diagnosis Date   AAA (abdominal aortic aneurysm) without rupture 02/23/2020   a.) AAA duplex 02/23/2020: 3.8 cm; b.) AAA duplex 11/28/2020: 3.7 cm; c.) CTA AO + BIFEM 12/18/2020: 3.8 x 3.4 cm; d.) AAA duplex 11/07/2021: 3.7 cm; e.) CTA AO + BIFEM 11/16/2021: 4.2 cm; f.) AAA duplex 05/28/2022: 4.0 cm; g.) AAA duplex 11/26/2022: 4.4 cm; h.) PET CT 10/01/2022: 4.3 cm; i.) AAA duplex 08/06/2023: 4.8 cm; j.) CTA abd/pel 11/20/2023: 4.8 x 4.6 cm   Alcoholism (HCC)    Anginal pain    Anxiety    Aortic atherosclerosis    Aortic root dilatation    Arthritis    Bilateral carotid artery disease 06/20/2020   a.) carotid doppler 06/20/2020: 1-39% RICA, 40-59% LICA; b.) carotid doppler 11/28/2020, 11/26/2022: 1-39% BICA   Bilateral inguinal hernia    CAD (coronary artery disease) 06/03/2017   a.) LHC 06/03/2017: 25% pLAD - med mgmt; b.) LHC 08/22/2021: 25% pLAD, 15% o-mLM, 25% dLM-pLAD, 25% o-mLCx, 25% oD2 - med mgmt   Carpal tunnel syndrome    Cigarette smoker    COPD (chronic obstructive pulmonary disease) (HCC)    COVID-19 2022   Deep vein thrombosis (DVT) of left lower extremity (HCC)    Depression    Diastolic dysfunction    Diverticulosis    Dupuytren contracture of both hands    Dyspnea    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    a.) small area of chronic encephalomalacia in the LEFT inferior temporal gyrus   GERD (gastroesophageal reflux disease)    Hepatic steatosis    HLD (hyperlipidemia)    Hypertension    Incomplete left bundle branch block (LBBB)    Intermittent chest pain 03/28/2017   Last Assessment & Plan:   Formatting of this note might be different from  the original.  Stable. 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   a.) PET CT 10/01/2022: 6 mm with SUV max of 6.4; b.) s.p FNA 11/05/2022 --> pathology negative for malignancy (DDx oncocytosis vs oncocytoma)   PAOD (peripheral arterial occlusive disease)    a.) s/p BILATERAL femoral endarterectomies 12/29/2020; b.) s/p aorto-bifemoral bypass   Peripheral edema 03/19/2021   Pre-diabetes    Stenosis of cervical spine with myelopathy (HCC)    SVT (supraventricular tachycardia)    a.) s/p ablation 02/28/2016   Vitamin B12 deficiency    Vitamin D deficiency     Assessment: Patient Reported Symptoms:  Cognitive Cognitive Status: Alert and oriented to person, place, and time, No symptoms reported, Insightful and able to interpret abstract concepts, Normal speech and language skills      Neurological Neurological Review of Symptoms: Other: [place] Neurological Symptoms/Conditions [RPT]: Reports weakness in both hands, will see Neurosurgeon 03/31/24    HEENT HEENT Symptoms Reported: Not assessed      Cardiovascular Cardiovascular Symptoms Reported: No symptoms reported    Respiratory Respiratory Symptoms Reported: No symptoms reported    Endocrine Endocrine Symptoms Reported: Not assessed Is patient diabetic?: No    Gastrointestinal Gastrointestinal Symptoms Reported: No symptoms reported      Genitourinary Genitourinary Symptoms Reported: No symptoms reported  Integumentary Integumentary Symptoms Reported: No symptoms reported    Musculoskeletal Musculoskelatal Symptoms Reviewed: Difficulty walking Additional Musculoskeletal Details: Walking is improving, having difficulty using hands due to carpel tunnel. PCP ordered outpatient PT/OT, and Neurology ordered hand therapy, first appointment is in December. Denies falls since 01/29/24. Has pain in feet, mostly left foot due to plantar warts.  He goes to [facility], states he believes the  warts should be frozen for removal but the doctor just digs them out and I'm not going back to that doctor.  Since his insurance is changing, he needs a [place] foot doctor, suggested he get a second opinion, he has sent a message to PCP for new referral. Musculoskeletal Self-Management Outcome: 4 (good)      Psychosocial Psychosocial Symptoms Reported: No symptoms reported          03/26/2024    PHQ2-9 Depression Screening   Little interest or pleasure in doing things    Feeling down, depressed, or hopeless    PHQ-2 - Total Score    Trouble falling or staying asleep, or sleeping too much    Feeling tired or having little energy    Poor appetite or overeating     Feeling bad about yourself - or that you are a failure or have let yourself or your family down    Trouble concentrating on things, such as reading the newspaper or watching television    Moving or speaking so slowly that other people could have noticed.  Or the opposite - being so fidgety or restless that you have been moving around a lot more than usual    Thoughts that you would be better off dead, or hurting yourself in some way    PHQ2-9 Total Score    If you checked off any problems, how difficult have these problems made it for you to do your work, take care of things at home, or get along with other people    Depression Interventions/Treatment      There were no vitals filed for this visit. Pain Scale: 0-10 (Reports plantar warts on bottom of his left foot. He has been to [facility] and MD digs out the warts but they need to be frozen out and the doctor won't do it) Pain Score: 8  Pain Type: Acute pain Pain Location: Foot Pain Orientation: Left Pain Onset: On-going  Medications Reviewed Today   Medications were not reviewed in this encounter     Recommendation:   Specialty provider follow-up : Neurosurgery 03/31/24  Follow Up Plan:   Telephone follow-up two weeks.  Santana Stamp BSN,  CCM [place]  VBC[facility] Population Health RN Care Manager Direct Dial: [phone]  Fax: [phone]

## 2024-03-26 NOTE — Patient Instructions (Signed)
 Visit Information  Thank you for taking time to visit with me today. Please don't hesitate to contact me if I can be of assistance to you before our next scheduled appointment.  Your next care management appointment is by telephone on Thursday, December 18th at 2:30pm.    Please call the care guide team at [phone] if you need to cancel, schedule, or reschedule an appointment.   Please call the [place]  National Suicide Prevention Lifeline: 548 717 7057 or TTY: 435-027-6503 TTY [phone]) to talk to a trained counselor if you are experiencing a Mental Health or Behavioral Health Crisis or need someone to talk to.  Santana Stamp BSN, CCM [place]  VBCI Population Health RN Care Manager Direct Dial: [phone]  Fax: [phone]

## 2024-03-29 ENCOUNTER — Telehealth: Payer: Self-pay

## 2024-03-29 DIAGNOSIS — G5603 Carpal tunnel syndrome, bilateral upper limbs: Secondary | ICD-10-CM | POA: Diagnosis not present

## 2024-03-29 DIAGNOSIS — M720 Palmar fascial fibromatosis [Dupuytren]: Secondary | ICD-10-CM | POA: Diagnosis not present

## 2024-03-29 NOTE — Telephone Encounter (Signed)
 Spoke with Glade and was advised it is ok to proceed with getting trigger finger injection today. Patient advised

## 2024-03-30 ENCOUNTER — Ambulatory Visit

## 2024-03-31 ENCOUNTER — Ambulatory Visit: Admitting: Occupational Therapy

## 2024-03-31 ENCOUNTER — Ambulatory Visit

## 2024-03-31 ENCOUNTER — Ambulatory Visit: Admitting: Physician Assistant

## 2024-03-31 VITALS — BP 134/96 | Temp 98.0°F | Ht 71.0 in | Wt 133.0 lb

## 2024-03-31 DIAGNOSIS — Z4789 Encounter for other orthopedic aftercare: Secondary | ICD-10-CM | POA: Diagnosis not present

## 2024-03-31 DIAGNOSIS — M47812 Spondylosis without myelopathy or radiculopathy, cervical region: Secondary | ICD-10-CM | POA: Diagnosis not present

## 2024-03-31 DIAGNOSIS — M4302 Spondylolysis, cervical region: Secondary | ICD-10-CM | POA: Diagnosis not present

## 2024-03-31 DIAGNOSIS — E46 Unspecified protein-calorie malnutrition: Secondary | ICD-10-CM

## 2024-03-31 DIAGNOSIS — Z09 Encounter for follow-up examination after completed treatment for conditions other than malignant neoplasm: Secondary | ICD-10-CM

## 2024-03-31 DIAGNOSIS — M4802 Spinal stenosis, cervical region: Secondary | ICD-10-CM

## 2024-03-31 DIAGNOSIS — Z981 Arthrodesis status: Secondary | ICD-10-CM | POA: Diagnosis not present

## 2024-03-31 NOTE — Progress Notes (Signed)
° °  DOS: 01/08/2024  HISTORY OF PRESENT ILLNESS:  03/31/24 Mr. Scott [name] is status post anterior cervical discectomy and fusion on 01/08/2024. Continues to have falls and myelopathy as expected as well as numbness in bilateral upper extremities.  He continues to do home exercises.  PHYSICAL EXAMINATION:   Vitals:   03/31/24 1050  BP: (!) 134/96  Temp: 98 F (36.7 C)   General: Patient is well developed, well nourished, calm, collected, and in no apparent distress.  NEUROLOGICAL:  General: In no acute distress.   Awake, alert, oriented to person, place, and time. Pupils equal round and reactive to light.   Strength: He has had a significant improvement in his walking ability as well as his left upper extremity function including his hand function.  His right upper extremity function continues to be quite blunted and is to baseline.   Incision c/d/i   ROS (Neurologic): Negative except as noted above  IMAGING: No evidence of acute hardware breakdown.  Will continue to follow for the formal read.  ASSESSMENT/PLAN:  Scott [name] is here today after a anterior cervical discectomy and fusion for severe progressive cervical myeloradiculopathy.  He continues to have severe deficits but has had improvement in his left upper extremity and ability to walk.  We discussed as we did previously a posterior decompression, but patient continues to smoke.  He was counseled on how this would be very important for him to decrease prior to us  going through the back.  I talked to him about the importance of occupational and physical therapy.  I have offered to reset this up for him as he is not currently undergoing this formally.  However patient refuses at this time.  He is concerned due to his weight loss and ability to have proper nutrition.  I have placed a referral to try to help him with this.   Lyle Decamp, PA-C Department of Neurosurgery

## 2024-04-01 ENCOUNTER — Ambulatory Visit: Admitting: Physical Therapy

## 2024-04-01 ENCOUNTER — Ambulatory Visit

## 2024-04-01 ENCOUNTER — Ambulatory Visit: Admitting: Occupational Therapy

## 2024-04-01 ENCOUNTER — Telehealth: Payer: Self-pay | Admitting: Neurosurgery

## 2024-04-01 NOTE — Telephone Encounter (Signed)
 Patient is calling to find out if he should have another post op appointment and when this needs to be. He also states he would like to pickup a copy of his EMG report today if possible. He will be in the area around 1:00. He would like a call once ready to pickup.

## 2024-04-01 NOTE — Telephone Encounter (Signed)
 Attempted to call patient. Phone went straight to voicemail and voicemail box is not set up yet.  He needs an appointment with Dr Claudene in 3 months (with xrays). A copy of his EMG report has been placed up front for pick up.

## 2024-04-01 NOTE — Telephone Encounter (Signed)
 Attempted to call patient to let him know that EMG results have been printed and are available for pick up. Phone went straight to voicemail, which has not been set up yet.   Waiting for Scott [name] to see when follow up is indicated.

## 2024-04-05 ENCOUNTER — Other Ambulatory Visit

## 2024-04-05 ENCOUNTER — Ambulatory Visit

## 2024-04-05 ENCOUNTER — Other Ambulatory Visit: Payer: Self-pay

## 2024-04-05 DIAGNOSIS — I779 Disorder of arteries and arterioles, unspecified: Secondary | ICD-10-CM

## 2024-04-05 DIAGNOSIS — I471 Supraventricular tachycardia, unspecified: Secondary | ICD-10-CM

## 2024-04-05 DIAGNOSIS — J3089 Other allergic rhinitis: Secondary | ICD-10-CM

## 2024-04-05 DIAGNOSIS — J449 Chronic obstructive pulmonary disease, unspecified: Secondary | ICD-10-CM

## 2024-04-05 DIAGNOSIS — Z86718 Personal history of other venous thrombosis and embolism: Secondary | ICD-10-CM

## 2024-04-05 DIAGNOSIS — F17210 Nicotine dependence, cigarettes, uncomplicated: Secondary | ICD-10-CM

## 2024-04-05 DIAGNOSIS — Z789 Other specified health status: Secondary | ICD-10-CM

## 2024-04-05 DIAGNOSIS — F17200 Nicotine dependence, unspecified, uncomplicated: Secondary | ICD-10-CM

## 2024-04-05 DIAGNOSIS — F419 Anxiety disorder, unspecified: Secondary | ICD-10-CM

## 2024-04-05 DIAGNOSIS — R531 Weakness: Secondary | ICD-10-CM

## 2024-04-05 NOTE — Telephone Encounter (Signed)
 Patient scheduled for 07/04/2024 for follow up and x-rays. Patient aware that his EMG report is at the front for pickup.

## 2024-04-06 ENCOUNTER — Ambulatory Visit

## 2024-04-07 ENCOUNTER — Ambulatory Visit: Admitting: Occupational Therapy

## 2024-04-07 ENCOUNTER — Ambulatory Visit

## 2024-04-08 ENCOUNTER — Ambulatory Visit

## 2024-04-08 ENCOUNTER — Telehealth: Payer: Self-pay

## 2024-04-09 ENCOUNTER — Ambulatory Visit: Admitting: Physical Therapy

## 2024-04-09 ENCOUNTER — Ambulatory Visit

## 2024-04-12 ENCOUNTER — Ambulatory Visit: Admitting: Occupational Therapy

## 2024-04-12 ENCOUNTER — Ambulatory Visit

## 2024-04-13 ENCOUNTER — Ambulatory Visit

## 2024-04-19 ENCOUNTER — Telehealth: Payer: Self-pay | Admitting: Neurosurgery

## 2024-04-19 ENCOUNTER — Ambulatory Visit

## 2024-04-19 ENCOUNTER — Ambulatory Visit: Admitting: Physical Therapy

## 2024-04-19 NOTE — Telephone Encounter (Signed)
 3rd attempt to call patient, but phone goes straight to voicemail, which has not been set up yet. Attempted x2 with the same result.

## 2024-04-19 NOTE — Telephone Encounter (Signed)
 2nd attempt to call patient, but phone goes straight to voicemail, which has not been set up yet. Attempted x2 with the same result.

## 2024-04-19 NOTE — Telephone Encounter (Signed)
 Attempted to call patient, but phone goes straight to voicemail, which has not been set up yet. Attempted x3 with the same result.

## 2024-04-19 NOTE — Telephone Encounter (Signed)
 Patient is calling to let our office know that he is having pain on the right side of his neck that has been going on for about a week now. He states he does not know if this is a pinched nerve or what but he states he is losing use of both arms, and is blacking out when leaning forward. He states that he is dropping things and that his shoulders are locking up from his shoulders down. He would like to know what he needs to do.

## 2024-04-20 ENCOUNTER — Ambulatory Visit

## 2024-04-21 ENCOUNTER — Telehealth: Payer: Self-pay

## 2024-04-21 NOTE — Telephone Encounter (Addendum)
 Per discussion with Stacy, blacking out when leaning forward should be evaluated in the ER.  Attempted to call patient again. Phone rings once and then goes straight to voicemail and voicemail box isn't set up yet.  Also attempted to contact sister Magalene (listed on DPR). Phone goes straight to voicemail with no identifying information. Left message stating that I was returning a call to Mr Nanci and that we have been trying to reach him via phone and mychart.

## 2024-04-21 NOTE — Patient Instructions (Signed)
 [name] - I am sorry I was unable to reach you today for our scheduled appointment. I work with Zafirov, Clarissa A, MD and am calling to support your healthcare needs. Please contact Santana Stamp, RNCM at [phone] at your earliest convenience. She is looking forward to speaking with you soon.   Thank you,  Elida Pulse, RNCM Case Manager [facility], Population Health Direct Dial: [phone]

## 2024-04-23 ENCOUNTER — Encounter

## 2024-04-23 ENCOUNTER — Ambulatory Visit

## 2024-04-23 ENCOUNTER — Telehealth: Payer: Self-pay

## 2024-04-23 NOTE — Telephone Encounter (Signed)
 Copied from CRM [id number]. Topic: Clinical - Lab/Test Results >> Apr 23, 2024  9:59 AM Dedra B wrote: Reason for CRM: Pt said he had an EMG on his hand in August but no one has gone over results. He tried reaching out to his surgeon but did not get a call back. Pt wants to know if Dr. Everlene or her nurse can go over the results with him.

## 2024-04-26 ENCOUNTER — Ambulatory Visit: Admitting: Physical Therapy

## 2024-04-26 ENCOUNTER — Ambulatory Visit

## 2024-04-27 ENCOUNTER — Other Ambulatory Visit: Payer: Self-pay

## 2024-04-27 ENCOUNTER — Ambulatory Visit

## 2024-04-28 NOTE — Patient Outreach (Signed)
 Complex Care Management   Visit Note  04/28/2024  Name:  Scott [name] MRN: [id number] DOB: [birth year]  Situation: Referral received for Complex Care Management related to SDOH needs, Impaired Mobility. I obtained verbal consent from Patient.  Visit completed with Scott [name]  on the phone  Background:   Past Medical History:  Diagnosis Date   AAA (abdominal aortic aneurysm) without rupture 02/23/2020   a.) AAA duplex 02/23/2020: 3.8 cm; b.) AAA duplex 11/28/2020: 3.7 cm; c.) CTA AO + BIFEM 12/18/2020: 3.8 x 3.4 cm; d.) AAA duplex 11/07/2021: 3.7 cm; e.) CTA AO + BIFEM 11/16/2021: 4.2 cm; f.) AAA duplex 05/28/2022: 4.0 cm; g.) AAA duplex 11/26/2022: 4.4 cm; h.) PET CT 10/01/2022: 4.3 cm; i.) AAA duplex 08/06/2023: 4.8 cm; j.) CTA abd/pel 11/20/2023: 4.8 x 4.6 cm   Alcoholism (HCC)    Anginal pain    Anxiety    Aortic atherosclerosis    Aortic root dilatation    Arthritis    Bilateral carotid artery disease 06/20/2020   a.) carotid doppler 06/20/2020: 1-39% RICA, 40-59% LICA; b.) carotid doppler 11/28/2020, 11/26/2022: 1-39% BICA   Bilateral inguinal hernia    CAD (coronary artery disease) 06/03/2017   a.) LHC 06/03/2017: 25% pLAD - med mgmt; b.) LHC 08/22/2021: 25% pLAD, 15% o-mLM, 25% dLM-pLAD, 25% o-mLCx, 25% oD2 - med mgmt   Carpal tunnel syndrome    Cigarette smoker    COPD (chronic obstructive pulmonary disease) (HCC)    COVID-19 2022   Deep vein thrombosis (DVT) of left lower extremity (HCC)    Depression    Diastolic dysfunction    Diverticulosis    Dupuytren contracture of both hands    Dyspnea    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    a.) small area of chronic encephalomalacia in the LEFT inferior temporal gyrus   GERD (gastroesophageal reflux disease)    Hepatic steatosis    HLD (hyperlipidemia)    Hypertension    Incomplete left bundle branch block (LBBB)    Intermittent chest pain 03/28/2017   Last Assessment & Plan:   Formatting of this note might be  different from the original.  Stable. 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   a.) PET CT 10/01/2022: 6 mm with SUV max of 6.4; b.) s.p FNA 11/05/2022 --> pathology negative for malignancy (DDx oncocytosis vs oncocytoma)   PAOD (peripheral arterial occlusive disease)    a.) s/p BILATERAL femoral endarterectomies 12/29/2020; b.) s/p aorto-bifemoral bypass   Peripheral edema 03/19/2021   Pre-diabetes    Stenosis of cervical spine with myelopathy (HCC)    SVT (supraventricular tachycardia)    a.) s/p ablation 02/28/2016   Vitamin B12 deficiency    Vitamin D deficiency     Assessment: Patient Reported Symptoms:  Cognitive Cognitive Status: Alert and oriented to person, place, and time, Insightful and able to interpret abstract concepts, Normal speech and language skills      Neurological Neurological Review of Symptoms: Other: [place] Neurological Symptoms/Conditions [RPT]: Reports weakness in both hands, saw Neurosurgeon 12/10 who offered to re-set up outpatient PT/OT but patient refused due to cost of $50 each session, not able to afford.  States he is doing  his own exercises at home. Neurological Management Strategies: Exercise  HEENT HEENT Symptoms Reported: Not assessed      Cardiovascular Cardiovascular Symptoms Reported: No symptoms reported    Respiratory Respiratory Symptoms Reported: No symptoms reported    Endocrine  Endocrine Symptoms Reported: Not assessed    Gastrointestinal Gastrointestinal Symptoms Reported: Not assessed      Genitourinary Genitourinary Symptoms Reported: Not assessed    Integumentary Integumentary Symptoms Reported: Not assessed    Musculoskeletal Musculoskelatal Symptoms Reviewed: No symptoms reported Additional Musculoskeletal Details: Reports ambulation has improved, not having to use walker, denies falls since 01/29/24. Musculoskeletal Management Strategies: Activity, Exercise Musculoskeletal  Self-Management Outcome: 4 (good)      Psychosocial Psychosocial Symptoms Reported: Not assessed          04/28/2024    PHQ2-9 Depression Screening   Little interest or pleasure in doing things    Feeling down, depressed, or hopeless    PHQ-2 - Total Score    Trouble falling or staying asleep, or sleeping too much    Feeling tired or having little energy    Poor appetite or overeating     Feeling bad about yourself - or that you are a failure or have let yourself or your family down    Trouble concentrating on things, such as reading the newspaper or watching television    Moving or speaking so slowly that other people could have noticed.  Or the opposite - being so fidgety or restless that you have been moving around a lot more than usual    Thoughts that you would be better off dead, or hurting yourself in some way    PHQ2-9 Total Score    If you checked off any problems, how difficult have these problems made it for you to do your work, take care of things at home, or get along with other people    Depression Interventions/Treatment      There were no vitals filed for this visit.    Medications Reviewed Today   Medications were not reviewed in this encounter     Recommendation:   Continue Current Plan of Care  Follow Up Plan:   Patient has met all care management goals. Care Management case will be closed. Patient has been provided contact information should new needs arise.   Santana Stamp BSN, CCM [place]  [facility] Population Health RN Care Manager Direct Dial: [phone]  Fax: [phone]

## 2024-04-28 NOTE — Patient Instructions (Signed)
 Visit Information  Thank you for taking time to visit with me today. Please don't hesitate to contact me if I can be of assistance to you before our next scheduled appointment.  Your next care management appointment is no further scheduled appointments.      Please call the care guide team at [phone] if you need to cancel, schedule, or reschedule an appointment.   Please call the [place]  National Suicide Prevention Lifeline: 608-075-7449 or TTY: 614-034-5691 TTY [phone]) to talk to a trained counselor if you are experiencing a Mental Health or Behavioral Health Crisis or need someone to talk to.  Santana Stamp BSN, CCM   [facility] Population Health RN Care Manager Direct Dial: [phone]  Fax: [phone]

## 2024-04-28 NOTE — Patient Outreach (Signed)
 SABRA

## 2024-04-29 ENCOUNTER — Ambulatory Visit

## 2024-04-29 ENCOUNTER — Ambulatory Visit: Admitting: Physical Therapy

## 2024-05-04 ENCOUNTER — Ambulatory Visit

## 2024-05-04 ENCOUNTER — Telehealth: Payer: Self-pay | Admitting: Neurosurgery

## 2024-05-04 ENCOUNTER — Ambulatory Visit: Admitting: Physical Therapy

## 2024-05-04 NOTE — Telephone Encounter (Signed)
 Per telephone encounter on 12/23/23, Dr Claudene wrote: They only found left-sided carpal tunnel syndrome, the right side is likely coming from his spine.  EMG from 11/27/23 from [facility] did not show severe carpal tunnel on the RIGHT. Impression: This is a complex study.  Findings are as follows: Multilevel chronic cervical radiculopathies affecting bilateral C5, C6, and C7 nerve roots/segments, which is moderate in degree electrically. Left median neuropathy at or distal to the wrist (moderate-to-severe), consistent with a clinical diagnosis of carpal tunnel syndrome.   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 Right median neuropathy at or distal to the wrist (mild), consistent with a clinical diagnosis of carpal tunnel syndrome.      He was previously scheduled for LEFT carpal tunnel release on 01/08/24, but this was canceled due to a wound on his hand. His note on 02/16/24 states, He has had a significant improvement in his left upper extremity function. ... He does continue to have multiple scratches and superficial traumas and skin breakdown throughout his bilateral upper extremities with multiple areas of bruising.  ... He has had a significant improvement in his walking ability as well as his left upper extremity function including his hand function.   His note on 03/31/24 states,  He continues to have severe deficits but has had improvement in his left upper extremity and ability to walk.   If [facility] now says he has RIGHT severe carpal tunnel, we need their note and EMG showing this. I sent a record request to [facility]. Once we receive this, he should then come in to see Dr Claudene to discuss.   I tried to call the patient to discuss, but his phone went straight to voicemail and his voicemail is not set up yet.  If he calls back, please find out if he has another phone # or if he has accidentally blocked our number. We have  not been able to reach him for quite some time.

## 2024-05-04 NOTE — Telephone Encounter (Signed)
 Patient was evaluated at [facility] for right hand symptoms and was diagnosed with severe carpal tunnel syndrome. They are requesting to discuss surgical options as soon as possible. He also let us  know about nodules as well

## 2024-05-06 ENCOUNTER — Ambulatory Visit

## 2024-05-06 NOTE — Telephone Encounter (Signed)
 I spoke with Scott [name]. He confirmed that we have the correct phone # on file and does not know why it is going straight to voicemail.  He states his ring and pinky finger of his right hand will not bend and he has been told he needs a procedure to straighten it. He reports that [facility] did not do a nerve test on his arms, it was just an office visit. They briefly saw him and then canceled his appointment and refunded his copay because they do not take his new insurance [employer]).  He also states his left hand is bothersome again and he would like to see Dr Claudene to discuss the left carpal tunnel procedure again.   Per his request, I have scheduled an appointment with Dr Claudene to discuss left carpal tunnel release again. I have also spoken with the ortho team in our office about his right hand and they have agreed to see him tomorrow to evaluate this.

## 2024-05-07 ENCOUNTER — Ambulatory Visit

## 2024-05-07 ENCOUNTER — Other Ambulatory Visit

## 2024-05-07 ENCOUNTER — Ambulatory Visit: Admitting: Physician Assistant

## 2024-05-07 ENCOUNTER — Ambulatory Visit: Admitting: Physical Therapy

## 2024-05-07 VITALS — BP 182/107 | HR 66 | Ht 71.0 in | Wt 134.6 lb

## 2024-05-07 DIAGNOSIS — M21241 Flexion deformity, right finger joints: Secondary | ICD-10-CM

## 2024-05-07 DIAGNOSIS — M720 Palmar fascial fibromatosis [Dupuytren]: Secondary | ICD-10-CM

## 2024-05-07 NOTE — Progress Notes (Signed)
 Patient presents to [facility] for further evaluation of trigger fingers.  Patient seen by [facility] on 05/04/2024. Evaluated and diagnosed with bilateral hand Dupuytren's (right ring finger contracture and left small finger cord). Due to [facility] being out of network for the patient, patient was referred to [facility]. Patient was advised to be evaluated by a hand surgeon for further management of his Dupuytren's.  Discussed with patient that Dr. Thamas Ike can evaluate any orthopedic condition, but only performs hip and knee replacements. Offered to re-evaluate patient, or could cancel today's appointment and place a referral to a hand specialist. Patient stated he preferred to cancel today's appointment and go see a hand surgeon. Offered referral to Dr. Erwin with [facility] or Dr. Jackquline Barrack with [facility] in [place]. Patient stated he could not drive to [facility] and would prefer to be seen in [place].  Referral placed. Patient marked as a no charge visit.  Advised patient to call Maralee Gibbs if any future needs or concerns.

## 2024-05-10 ENCOUNTER — Ambulatory Visit

## 2024-05-11 ENCOUNTER — Ambulatory Visit

## 2024-05-13 ENCOUNTER — Ambulatory Visit

## 2024-05-13 ENCOUNTER — Ambulatory Visit: Admitting: Occupational Therapy

## 2024-05-13 ENCOUNTER — Ambulatory Visit: Admitting: Physical Therapy

## 2024-05-17 ENCOUNTER — Ambulatory Visit

## 2024-05-17 ENCOUNTER — Ambulatory Visit: Admitting: Physical Therapy

## 2024-05-19 ENCOUNTER — Ambulatory Visit

## 2024-05-19 ENCOUNTER — Ambulatory Visit: Admitting: Physical Therapy

## 2024-05-19 ENCOUNTER — Ambulatory Visit: Payer: Self-pay | Admitting: Cardiovascular Disease

## 2024-05-20 ENCOUNTER — Encounter

## 2024-05-24 ENCOUNTER — Ambulatory Visit: Admitting: Physical Therapy

## 2024-05-24 ENCOUNTER — Ambulatory Visit: Admitting: Occupational Therapy

## 2024-05-26 ENCOUNTER — Ambulatory Visit

## 2024-05-26 ENCOUNTER — Ambulatory Visit: Admitting: Neurosurgery

## 2024-05-26 ENCOUNTER — Ambulatory Visit: Admitting: Physical Therapy

## 2024-05-26 VITALS — BP 160/86 | Ht 71.0 in | Wt 138.0 lb

## 2024-05-26 DIAGNOSIS — M4802 Spinal stenosis, cervical region: Secondary | ICD-10-CM

## 2024-05-26 DIAGNOSIS — R29898 Other symptoms and signs involving the musculoskeletal system: Secondary | ICD-10-CM

## 2024-05-26 DIAGNOSIS — G5603 Carpal tunnel syndrome, bilateral upper limbs: Secondary | ICD-10-CM | POA: Diagnosis not present

## 2024-05-26 DIAGNOSIS — G5602 Carpal tunnel syndrome, left upper limb: Secondary | ICD-10-CM

## 2024-05-26 NOTE — Progress Notes (Deleted)
 Since S  Motor   Sensory  Pain

## 2024-05-26 NOTE — Progress Notes (Signed)
" ° °  DOS: 01/08/2024  HISTORY OF PRESENT ILLNESS:  05/26/24 Scott [name] is status post anterior cervical discectomy and fusion on 01/08/2024. PMHx CAD, Hypertension, SVT, AAA without rupture, DVT, COPD. He was previously scheduled for a Carpal Tunnel Surgery; however, the patient fell and scraped his hand close to where the incision would be the morning of the surgery and the surgery was cancelled. Today the patient presents wanting to discuss rescheduling.   PHYSICAL EXAMINATION:   Vitals:   05/26/24 1002  BP: (!) 160/86   General: Patient is well developed, well nourished, calm, collected, and in no apparent distress.  NEUROLOGICAL:  General: In no acute distress.   Awake, alert, oriented to person, place, and time. Pupils equal round and reactive to light.   Strength: 4/5 bilaterally Right digit 4 flexion deformity present  Sensation: bilateral numbness descending from elbows to all 5 fingers bilaterally. Patient states it is worse on the left.  + Phalen's sign in R hand. L hand reported unchanged numbness.   ROS (Neurologic): Negative except as noted above  IMAGING: DG Cervical Spine 2-3 view  IMPRESSION: Status post ACDF of C3-4 without evidence of hardware complication. Electronically Signed   By: Corean Salter M.D.   ASSESSMENT/PLAN:  Scott [name] is here to discuss rescheduling his carpal tunnel surgery following a cancellation of the first surgery due to a fall with scrape of the planned incision site location. He has bilateral carpal tunnel syndrome and would like to have his Left wrist operated on first. Currently he has 4/5 strength bilaterally with report that the numbness is worse on the left.   He has a R digit 4 flexion deformity which is currently being followed by Dr. Jackquline Barrack with [facility] in [place]. The patient expresses that he is going to pursue surgery with this first and then contact us  about scheduling his carpal tunnel syndrome  surgery after.   Jayson Eis, NP-C Department of Neurosurgery  Addendum: I saw and evaluated this patient in tandem with NP Peickert "

## 2024-05-27 ENCOUNTER — Ambulatory Visit: Admitting: Occupational Therapy

## 2024-05-28 ENCOUNTER — Encounter: Payer: Self-pay | Admitting: *Deleted

## 2024-05-28 NOTE — Progress Notes (Signed)
 Scott [name]                                          MRN: [id number]   05/28/2024   The [facility]BC[facility] Quality Team Specialist reviewed this patient medical record for the purposes of chart review for care gap closure. The following were reviewed: chart review for care gap closure-controlling blood pressure.    VBCI Quality Team

## 2024-05-31 ENCOUNTER — Ambulatory Visit

## 2024-05-31 ENCOUNTER — Ambulatory Visit: Admitting: Physical Therapy

## 2024-06-01 ENCOUNTER — Encounter: Admitting: Family Medicine

## 2024-06-02 ENCOUNTER — Ambulatory Visit: Admitting: Physical Therapy

## 2024-06-02 ENCOUNTER — Ambulatory Visit

## 2024-06-07 ENCOUNTER — Ambulatory Visit

## 2024-06-09 ENCOUNTER — Ambulatory Visit

## 2024-06-14 ENCOUNTER — Ambulatory Visit

## 2024-06-16 ENCOUNTER — Ambulatory Visit: Admitting: Occupational Therapy

## 2024-06-21 ENCOUNTER — Ambulatory Visit: Admitting: Occupational Therapy

## 2024-06-23 ENCOUNTER — Ambulatory Visit: Admitting: Occupational Therapy

## 2024-06-25 ENCOUNTER — Encounter (INDEPENDENT_AMBULATORY_CARE_PROVIDER_SITE_OTHER)

## 2024-06-25 ENCOUNTER — Other Ambulatory Visit (INDEPENDENT_AMBULATORY_CARE_PROVIDER_SITE_OTHER)

## 2024-06-25 ENCOUNTER — Ambulatory Visit (INDEPENDENT_AMBULATORY_CARE_PROVIDER_SITE_OTHER): Admitting: Nurse Practitioner

## 2024-06-28 ENCOUNTER — Ambulatory Visit: Admitting: Occupational Therapy

## 2024-06-30 ENCOUNTER — Ambulatory Visit

## 2024-07-05 ENCOUNTER — Other Ambulatory Visit

## 2024-07-05 ENCOUNTER — Ambulatory Visit

## 2024-07-06 ENCOUNTER — Ambulatory Visit: Admitting: Occupational Therapy

## 2024-07-08 ENCOUNTER — Ambulatory Visit: Admitting: Occupational Therapy
